# Patient Record
Sex: Male | Born: 1960
Health system: Southern US, Community
[De-identification: ages and names within clinical notes are randomized; demographics above are authoritative.]

## PROBLEM LIST (undated history)

## (undated) DIAGNOSIS — I10 Essential (primary) hypertension: Secondary | ICD-10-CM

## (undated) DIAGNOSIS — E876 Hypokalemia: Secondary | ICD-10-CM

## (undated) DIAGNOSIS — F259 Schizoaffective disorder, unspecified: Secondary | ICD-10-CM

## (undated) DIAGNOSIS — R600 Localized edema: Secondary | ICD-10-CM

## (undated) DIAGNOSIS — F200 Paranoid schizophrenia: Secondary | ICD-10-CM

## (undated) DIAGNOSIS — R011 Cardiac murmur, unspecified: Secondary | ICD-10-CM

## (undated) DIAGNOSIS — F319 Bipolar disorder, unspecified: Secondary | ICD-10-CM

## (undated) HISTORY — PX: FRACTURE SURGERY: SHX138

## (undated) HISTORY — PX: BREAST SURGERY: SHX581

## (undated) HISTORY — DX: Cardiac murmur, unspecified: R01.1

## (undated) HISTORY — PX: EYE SURGERY: SHX253

---
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
## 2011-10-11 ENCOUNTER — Encounter (HOSPITAL_COMMUNITY): Payer: Self-pay | Admitting: *Deleted

## 2011-10-11 ENCOUNTER — Emergency Department (HOSPITAL_COMMUNITY)
Admission: EM | Admit: 2011-10-11 | Discharge: 2011-10-11 | Disposition: A | Discharge status: Other Acute Inpatient Hospital | Payer: Medicare Other | Attending: Emergency Medicine | Admitting: Emergency Medicine

## 2011-10-11 DIAGNOSIS — I10 Essential (primary) hypertension: Secondary | ICD-10-CM | POA: Insufficient documentation

## 2011-10-11 DIAGNOSIS — E119 Type 2 diabetes mellitus without complications: Secondary | ICD-10-CM | POA: Insufficient documentation

## 2011-10-11 DIAGNOSIS — M549 Dorsalgia, unspecified: Secondary | ICD-10-CM | POA: Insufficient documentation

## 2011-10-11 DIAGNOSIS — F22 Delusional disorders: Secondary | ICD-10-CM | POA: Insufficient documentation

## 2011-10-11 DIAGNOSIS — F209 Schizophrenia, unspecified: Secondary | ICD-10-CM | POA: Insufficient documentation

## 2011-10-11 HISTORY — DX: Essential (primary) hypertension: I10

## 2011-10-11 LAB — BASIC METABOLIC PANEL
CO2: 27 mEq/L (ref 19–32)
Calcium: 9.9 mg/dL (ref 8.4–10.5)
Creatinine, Ser: 1.33 mg/dL (ref 0.50–1.35)
Glucose, Bld: 114 mg/dL — ABNORMAL HIGH (ref 70–99)

## 2011-10-11 LAB — CBC
HCT: 38.8 % — ABNORMAL LOW (ref 39.0–52.0)
Hemoglobin: 12.9 g/dL — ABNORMAL LOW (ref 13.0–17.0)
MCV: 86.8 fL (ref 78.0–100.0)
RBC: 4.47 MIL/uL (ref 4.22–5.81)
RDW: 15.7 % — ABNORMAL HIGH (ref 11.5–15.5)
WBC: 7.5 10*3/uL (ref 4.0–10.5)

## 2011-10-11 LAB — DIFFERENTIAL
Eosinophils Relative: 3 % (ref 0–5)
Lymphocytes Relative: 27 % (ref 12–46)
Lymphs Abs: 2.1 10*3/uL (ref 0.7–4.0)
Monocytes Absolute: 1.2 10*3/uL — ABNORMAL HIGH (ref 0.1–1.0)
Monocytes Relative: 16 % — ABNORMAL HIGH (ref 3–12)

## 2011-10-11 LAB — RAPID URINE DRUG SCREEN, HOSP PERFORMED: Benzodiazepines: NOT DETECTED

## 2011-10-11 MED ORDER — HYDRALAZINE HCL 25 MG PO TABS
100.0000 mg | ORAL_TABLET | Freq: Four times a day (QID) | ORAL | Status: DC
Start: 2011-10-11 — End: 2011-10-11
  Administered 2011-10-11: 100 mg via ORAL
  Filled 2011-10-11 (×5): qty 2

## 2011-10-11 MED ORDER — BENZTROPINE MESYLATE 1 MG PO TABS
1.0000 mg | ORAL_TABLET | Freq: Every day | ORAL | Status: DC
  Administered 2011-10-11: 1 mg via ORAL
  Filled 2011-10-11: qty 1

## 2011-10-11 MED ORDER — GABAPENTIN 300 MG PO CAPS
300.0000 mg | ORAL_CAPSULE | Freq: Three times a day (TID) | ORAL | Status: DC
  Filled 2011-10-11 (×3): qty 1

## 2011-10-11 MED ORDER — IBUPROFEN 400 MG PO TABS: 600.0000 mg | ORAL_TABLET | Freq: Three times a day (TID) | ORAL | Status: DC | PRN

## 2011-10-11 MED ORDER — AMLODIPINE BESYLATE 5 MG PO TABS: 5.0000 mg | ORAL_TABLET | Freq: Two times a day (BID) | ORAL | Status: DC

## 2011-10-11 MED ORDER — POTASSIUM CHLORIDE CRYS ER 20 MEQ PO TBCR
40.0000 meq | EXTENDED_RELEASE_TABLET | Freq: Once | ORAL | Status: AC
  Administered 2011-10-11: 40 meq via ORAL
  Filled 2011-10-11: qty 2

## 2011-10-11 MED ORDER — LORAZEPAM 1 MG PO TABS
1.0000 mg | ORAL_TABLET | ORAL | Status: DC | PRN
  Administered 2011-10-11: 1 mg via ORAL
  Filled 2011-10-11: qty 1

## 2011-10-11 MED ORDER — METFORMIN HCL 500 MG PO TABS: 500.0000 mg | ORAL_TABLET | Freq: Two times a day (BID) | ORAL | Status: DC

## 2011-10-11 MED ORDER — THIOTHIXENE 5 MG PO CAPS
15.0000 mg | ORAL_CAPSULE | ORAL | Status: DC
  Filled 2011-10-11 (×2): qty 1

## 2011-10-11 MED ORDER — THIOTHIXENE 5 MG PO CAPS
15.0000 mg | ORAL_CAPSULE | Freq: Every day | ORAL | Status: DC
  Filled 2011-10-11 (×2): qty 3

## 2011-10-11 MED ORDER — ACETAMINOPHEN 325 MG PO TABS: 650.0000 mg | ORAL_TABLET | ORAL | Status: DC | PRN

## 2011-10-11 MED ORDER — ONDANSETRON HCL 4 MG PO TABS: 4.0000 mg | ORAL_TABLET | Freq: Three times a day (TID) | ORAL | Status: DC | PRN
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
## 2011-10-11 NOTE — ED Notes (Signed)
Ivan [name] has refused administration of all other medications; Dr. Christy Gentles advised of same.  No further actions taken at present.
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
## 2013-01-07 ENCOUNTER — Encounter (HOSPITAL_COMMUNITY): Payer: Self-pay

## 2013-01-07 ENCOUNTER — Emergency Department (HOSPITAL_COMMUNITY)
Admission: EM | Admit: 2013-01-07 | Discharge: 2013-01-08 | Disposition: A | Discharge status: Other Acute Inpatient Hospital | Payer: Medicare Other | Attending: Psychiatry | Admitting: Psychiatry

## 2013-01-07 DIAGNOSIS — Z79899 Other long term (current) drug therapy: Secondary | ICD-10-CM | POA: Insufficient documentation

## 2013-01-07 DIAGNOSIS — IMO0002 Reserved for concepts with insufficient information to code with codable children: Secondary | ICD-10-CM | POA: Insufficient documentation

## 2013-01-07 DIAGNOSIS — F319 Bipolar disorder, unspecified: Secondary | ICD-10-CM | POA: Insufficient documentation

## 2013-01-07 DIAGNOSIS — F29 Unspecified psychosis not due to a substance or known physiological condition: Secondary | ICD-10-CM

## 2013-01-07 DIAGNOSIS — F17200 Nicotine dependence, unspecified, uncomplicated: Secondary | ICD-10-CM | POA: Insufficient documentation

## 2013-01-07 DIAGNOSIS — F259 Schizoaffective disorder, unspecified: Secondary | ICD-10-CM | POA: Insufficient documentation

## 2013-01-07 DIAGNOSIS — E119 Type 2 diabetes mellitus without complications: Secondary | ICD-10-CM | POA: Insufficient documentation

## 2013-01-07 DIAGNOSIS — Z7982 Long term (current) use of aspirin: Secondary | ICD-10-CM | POA: Insufficient documentation

## 2013-01-07 DIAGNOSIS — F22 Delusional disorders: Secondary | ICD-10-CM | POA: Insufficient documentation

## 2013-01-07 DIAGNOSIS — I10 Essential (primary) hypertension: Secondary | ICD-10-CM | POA: Insufficient documentation

## 2013-01-07 HISTORY — DX: Schizoaffective disorder, unspecified: F25.9

## 2013-01-07 HISTORY — DX: Bipolar disorder, unspecified: F31.9

## 2013-01-07 LAB — ETHANOL: Alcohol, Ethyl (B): <11 mg/dL (ref 0–11)

## 2013-01-07 LAB — BASIC METABOLIC PANEL
CO2: 29 mEq/L (ref 19–32)
Chloride: 103 mEq/L (ref 96–112)
Glucose, Bld: 136 mg/dL — ABNORMAL HIGH (ref 70–99)
Potassium: 2.9 mEq/L — ABNORMAL LOW (ref 3.5–5.1)
Sodium: 140 mEq/L (ref 135–145)

## 2013-01-07 LAB — CBC WITH DIFFERENTIAL/PLATELET
Basophils Relative: 1 % (ref 0–1)
Eosinophils Relative: 4 % (ref 0–5)
HCT: 34.5 % — ABNORMAL LOW (ref 39.0–52.0)
Hemoglobin: 11.2 g/dL — ABNORMAL LOW (ref 13.0–17.0)
Lymphocytes Relative: 28 % (ref 12–46)
MCHC: 32.5 g/dL (ref 30.0–36.0)
Monocytes Relative: 13 % — ABNORMAL HIGH (ref 3–12)
Neutro Abs: 3.8 10*3/uL (ref 1.7–7.7)
RBC: 3.85 MIL/uL — ABNORMAL LOW (ref 4.22–5.81)

## 2013-01-07 LAB — RAPID URINE DRUG SCREEN, HOSP PERFORMED
Amphetamines: NOT DETECTED
Cocaine: NOT DETECTED
Opiates: NOT DETECTED
Tetrahydrocannabinol: NOT DETECTED

## 2013-01-07 LAB — POCT I-STAT, CHEM 8
BUN: 14 mg/dL (ref 6–23)
Creatinine, Ser: 1.3 mg/dL (ref 0.50–1.35)
Glucose, Bld: 117 mg/dL — ABNORMAL HIGH (ref 70–99)
Hemoglobin: 11.9 g/dL — ABNORMAL LOW (ref 13.0–17.0)
TCO2: 29 mmol/L (ref 0–100)

## 2013-01-07 MED ORDER — HYDRALAZINE HCL 50 MG PO TABS: 100.0000 mg | ORAL_TABLET | Freq: Four times a day (QID) | ORAL | Status: DC

## 2013-01-07 MED ORDER — THIOTHIXENE 5 MG PO CAPS
15.0000 mg | ORAL_CAPSULE | Freq: Every day | ORAL | Status: DC
  Filled 2013-01-07 (×2): qty 1

## 2013-01-07 MED ORDER — AMLODIPINE BESYLATE 5 MG PO TABS
5.0000 mg | ORAL_TABLET | Freq: Two times a day (BID) | ORAL | Status: DC
  Administered 2013-01-07: 5 mg via ORAL
  Filled 2013-01-07: qty 1

## 2013-01-07 MED ORDER — IBUPROFEN 400 MG PO TABS
400.0000 mg | ORAL_TABLET | Freq: Once | ORAL | Status: AC
Start: 2013-01-07 — End: 2013-01-07
  Administered 2013-01-07: 400 mg via ORAL
  Filled 2013-01-07: qty 1

## 2013-01-07 MED ORDER — THIOTHIXENE 1 MG PO CAPS
ORAL_CAPSULE | ORAL | Status: AC
  Filled 2013-01-07: qty 4

## 2013-01-07 MED ORDER — POTASSIUM CHLORIDE ER 10 MEQ PO TBCR
10.0000 meq | EXTENDED_RELEASE_TABLET | Freq: Two times a day (BID) | ORAL | Status: DC
  Filled 2013-01-07 (×4): qty 1

## 2013-01-07 MED ORDER — IBUPROFEN 400 MG PO TABS
200.0000 mg | ORAL_TABLET | Freq: Four times a day (QID) | ORAL | Status: DC | PRN
  Administered 2013-01-07: 200 mg via ORAL
  Filled 2013-01-07: qty 1

## 2013-01-07 MED ORDER — POTASSIUM CHLORIDE 20 MEQ/15ML (10%) PO LIQD
40.0000 meq | Freq: Once | ORAL | Status: AC
  Administered 2013-01-07: 40 meq via ORAL
  Filled 2013-01-07: qty 30

## 2013-01-07 MED ORDER — GABAPENTIN 300 MG PO CAPS
ORAL_CAPSULE | ORAL | Status: AC
  Filled 2013-01-07: qty 1

## 2013-01-07 MED ORDER — METFORMIN HCL 500 MG PO TABS: 500.0000 mg | ORAL_TABLET | Freq: Two times a day (BID) | ORAL | Status: DC

## 2013-01-07 MED ORDER — GABAPENTIN 300 MG PO CAPS
300.0000 mg | ORAL_CAPSULE | Freq: Three times a day (TID) | ORAL | Status: DC
  Administered 2013-01-07: 300 mg via ORAL
  Filled 2013-01-07 (×5): qty 1

## 2013-01-07 MED ORDER — BENZTROPINE MESYLATE 1 MG PO TABS
1.0000 mg | ORAL_TABLET | Freq: Every day | ORAL | Status: DC
  Administered 2013-01-07: 1 mg via ORAL
  Filled 2013-01-07: qty 1

## 2013-01-07 MED ORDER — HYDRALAZINE HCL 25 MG PO TABS
ORAL_TABLET | ORAL | Status: AC
  Filled 2013-01-07: qty 4

## 2013-01-07 MED ORDER — POTASSIUM CHLORIDE CRYS ER 20 MEQ PO TBCR
EXTENDED_RELEASE_TABLET | ORAL | Status: AC
  Administered 2013-01-07: 10 meq
  Filled 2013-01-07: qty 1

## 2013-01-07 MED ORDER — HYDRALAZINE HCL 50 MG PO TABS
100.0000 mg | ORAL_TABLET | Freq: Four times a day (QID) | ORAL | Status: DC
  Administered 2013-01-07: 100 mg via ORAL
  Filled 2013-01-07 (×7): qty 2

## 2013-01-07 MED ORDER — THIOTHIXENE 2 MG PO CAPS
ORAL_CAPSULE | ORAL | Status: AC
  Filled 2013-01-07: qty 3

## 2013-01-07 MED ORDER — ASPIRIN EC 81 MG PO TBEC
81.0000 mg | DELAYED_RELEASE_TABLET | Freq: Every day | ORAL | Status: DC
  Filled 2013-01-07 (×2): qty 1

## 2013-01-07 MED ORDER — FUROSEMIDE 40 MG PO TABS
40.0000 mg | ORAL_TABLET | Freq: Two times a day (BID) | ORAL | Status: DC
  Administered 2013-01-07: 40 mg via ORAL
  Filled 2013-01-07 (×2): qty 1

## 2013-01-07 NOTE — Progress Notes (Signed)
SUPPORT PAPERWORK FAXED TO [facility].

## 2013-01-07 NOTE — Progress Notes (Signed)
PT ACCEPTED BY [facility] TO DR Darleene Cleaver ROOM 407-1.

## 2013-01-07 NOTE — ED Notes (Signed)
[facility] notifed that bed is available and ready for this patients transfer.  Patient also made aware of plan for transfer.  Patient spoke with Bonnita Levan and is agreeable. Report called to Opal Sidles, Therapist, sports at [facility].

## 2013-01-07 NOTE — ED Notes (Signed)
Ambulatory to bathroom.  Returned to room and remains under observation of [facility] Deputy.
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
## 2013-01-07 NOTE — ED Notes (Signed)
Attempted to give patient Navane 15mg  as ordered earlier this evening.  He does not want to take now - states he took it earlier today before he went to court. Agreeable to take all other meds as ordered
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
## 2013-01-07 NOTE — ED Notes (Signed)
Ivan [name] (ACT) will be here after 6pm to see the Pt.

## 2013-01-07 NOTE — ED Notes (Signed)
Pt states he was in court today regarding family issues.

## 2013-01-08 ENCOUNTER — Inpatient Hospital Stay (HOSPITAL_COMMUNITY)
Admission: EM | Admit: 2013-01-08 | Discharge: 2013-01-14 | DRG: 885 | Disposition: A | Discharge status: Home / Self Care | Payer: 59 | Source: Intra-hospital | Attending: Psychiatry | Admitting: Psychiatry

## 2013-01-08 ENCOUNTER — Encounter (HOSPITAL_COMMUNITY): Payer: Self-pay | Admitting: *Deleted

## 2013-01-08 DIAGNOSIS — Z79899 Other long term (current) drug therapy: Secondary | ICD-10-CM

## 2013-01-08 DIAGNOSIS — F200 Paranoid schizophrenia: Principal | ICD-10-CM | POA: Diagnosis present

## 2013-01-08 DIAGNOSIS — I10 Essential (primary) hypertension: Secondary | ICD-10-CM | POA: Diagnosis present

## 2013-01-08 DIAGNOSIS — E119 Type 2 diabetes mellitus without complications: Secondary | ICD-10-CM | POA: Diagnosis present

## 2013-01-08 LAB — GLUCOSE, CAPILLARY
Glucose-Capillary: 122 mg/dL — ABNORMAL HIGH (ref 70–99)
Glucose-Capillary: 154 mg/dL — ABNORMAL HIGH (ref 70–99)

## 2013-01-08 LAB — POCT I-STAT TROPONIN I

## 2013-01-08 LAB — HEMOGLOBIN A1C
Hgb A1c MFr Bld: 6.6 % — ABNORMAL HIGH (ref <5.7)
Mean Plasma Glucose: 143 mg/dL — ABNORMAL HIGH (ref <117)

## 2013-01-08 MED ORDER — ALUM & MAG HYDROXIDE-SIMETH 200-200-20 MG/5ML PO SUSP: 30.0000 mL | ORAL | Status: DC | PRN

## 2013-01-08 MED ORDER — MAGNESIUM HYDROXIDE 400 MG/5ML PO SUSP: 30.0000 mL | Freq: Every day | ORAL | Status: DC | PRN

## 2013-01-08 MED ORDER — BENZTROPINE MESYLATE 1 MG PO TABS
1.0000 mg | ORAL_TABLET | Freq: Every day | ORAL | Status: DC
  Administered 2013-01-08 – 2013-01-14 (×7): 1 mg via ORAL
  Filled 2013-01-08 (×9): qty 1

## 2013-01-08 MED ORDER — ASPIRIN EC 81 MG PO TBEC
81.0000 mg | DELAYED_RELEASE_TABLET | Freq: Every day | ORAL | Status: DC
  Administered 2013-01-08 – 2013-01-14 (×7): 81 mg via ORAL
  Filled 2013-01-08 (×9): qty 1

## 2013-01-08 MED ORDER — POTASSIUM CHLORIDE ER 10 MEQ PO TBCR
10.0000 meq | EXTENDED_RELEASE_TABLET | Freq: Two times a day (BID) | ORAL | Status: DC
  Administered 2013-01-08 – 2013-01-14 (×13): 10 meq via ORAL
  Filled 2013-01-08 (×17): qty 1

## 2013-01-08 MED ORDER — INSULIN ASPART 100 UNIT/ML ~~LOC~~ SOLN: 0.0000 [IU] | Freq: Every day | SUBCUTANEOUS | Status: DC

## 2013-01-08 MED ORDER — AMLODIPINE BESYLATE 5 MG PO TABS
5.0000 mg | ORAL_TABLET | Freq: Two times a day (BID) | ORAL | Status: DC
  Administered 2013-01-08 – 2013-01-14 (×13): 5 mg via ORAL
  Filled 2013-01-08 (×18): qty 1

## 2013-01-08 MED ORDER — NICOTINE POLACRILEX 2 MG MT GUM
2.0000 mg | CHEWING_GUM | OROMUCOSAL | Status: DC | PRN
  Administered 2013-01-08 – 2013-01-14 (×24): 2 mg via ORAL
  Filled 2013-01-08 (×3): qty 1

## 2013-01-08 MED ORDER — CLONIDINE HCL 0.1 MG PO TABS
0.1000 mg | ORAL_TABLET | Freq: Three times a day (TID) | ORAL | Status: DC | PRN
  Administered 2013-01-09: 0.1 mg via ORAL
  Filled 2013-01-08: qty 1

## 2013-01-08 MED ORDER — GABAPENTIN 300 MG PO CAPS
300.0000 mg | ORAL_CAPSULE | Freq: Three times a day (TID) | ORAL | Status: DC
  Administered 2013-01-08 – 2013-01-09 (×4): 300 mg via ORAL
  Filled 2013-01-08 (×7): qty 1

## 2013-01-08 MED ORDER — INSULIN ASPART 100 UNIT/ML ~~LOC~~ SOLN
0.0000 [IU] | Freq: Three times a day (TID) | SUBCUTANEOUS | Status: DC
  Administered 2013-01-08 – 2013-01-11 (×5): 3 [IU] via SUBCUTANEOUS
  Administered 2013-01-11: 0 [IU] via SUBCUTANEOUS
  Administered 2013-01-11 – 2013-01-12 (×4): 3 [IU] via SUBCUTANEOUS
  Administered 2013-01-13: 4 [IU] via SUBCUTANEOUS
  Administered 2013-01-13 – 2013-01-14 (×2): 3 [IU] via SUBCUTANEOUS

## 2013-01-08 MED ORDER — TRAZODONE HCL 50 MG PO TABS
50.0000 mg | ORAL_TABLET | Freq: Every evening | ORAL | Status: DC | PRN
  Administered 2013-01-08 – 2013-01-13 (×6): 50 mg via ORAL
  Filled 2013-01-08 (×16): qty 1

## 2013-01-08 MED ORDER — THIOTHIXENE 5 MG PO CAPS
15.0000 mg | ORAL_CAPSULE | Freq: Every day | ORAL | Status: DC
  Administered 2013-01-08 – 2013-01-14 (×7): 15 mg via ORAL
  Filled 2013-01-08 (×9): qty 1

## 2013-01-08 MED ORDER — FUROSEMIDE 40 MG PO TABS
40.0000 mg | ORAL_TABLET | Freq: Two times a day (BID) | ORAL | Status: DC
  Administered 2013-01-08 – 2013-01-14 (×13): 40 mg via ORAL
  Filled 2013-01-08 (×17): qty 1

## 2013-01-08 MED ORDER — ACETAMINOPHEN 325 MG PO TABS: 650.0000 mg | ORAL_TABLET | Freq: Four times a day (QID) | ORAL | Status: DC | PRN

## 2013-01-08 MED ORDER — NICOTINE 14 MG/24HR TD PT24
14.0000 mg | MEDICATED_PATCH | Freq: Every day | TRANSDERMAL | Status: DC
  Filled 2013-01-08: qty 1

## 2013-01-08 MED ORDER — METFORMIN HCL 500 MG PO TABS
500.0000 mg | ORAL_TABLET | Freq: Two times a day (BID) | ORAL | Status: DC
  Administered 2013-01-08 – 2013-01-14 (×13): 500 mg via ORAL
  Filled 2013-01-08 (×17): qty 1

## 2013-01-08 MED ORDER — IBUPROFEN 600 MG PO TABS
600.0000 mg | ORAL_TABLET | Freq: Four times a day (QID) | ORAL | Status: DC | PRN
  Administered 2013-01-08 – 2013-01-13 (×3): 600 mg via ORAL
  Filled 2013-01-08 (×3): qty 1

## 2013-01-08 MED ORDER — HYDRALAZINE HCL 50 MG PO TABS
100.0000 mg | ORAL_TABLET | Freq: Four times a day (QID) | ORAL | Status: DC
  Administered 2013-01-08 – 2013-01-14 (×26): 100 mg via ORAL
  Filled 2013-01-08 (×35): qty 2
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
## 2013-01-08 NOTE — BHH Group Notes (Signed)
[facility] LCSW Aftercare Discharge Planning Group Note   01/08/2013 7:43 AM  Participation Quality:  Did not attend.  Said his shoulder was hurting too much to attend    [name], Barbaraann Rondo B

## 2013-01-08 NOTE — Progress Notes (Signed)
Adult Psychoeducational Group Note  Date:  01/08/2013 Time:  11:33 PM  Group Topic/Focus:  Karaoke  Participation Level:  Active  Participation Quality:  Appropriate  Affect:  Appropriate  Cognitive:  Alert  Insight: Appropriate  Engagement in Group:  Engaged  Modes of Intervention:  Activity  Additional Comments:    Sharmon Revere 01/08/2013, 11:33 PM
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
## 2013-01-08 NOTE — Progress Notes (Signed)
Adult Psychoeducational Group Note  Date:  01/08/2013 Time:  0930 Group Topic/Focus:  Rediscovering Joy:   The focus of this group is to explore various ways to relieve stress in a positive manner.  Participation Level:  Minimal  Participation Quality:  Appropriate  Affect:  Appropriate  Cognitive:  Oriented  Insight: Limited  Engagement in Group:  Limited  Modes of Intervention:  Discussion and Education  Additional Comments:  Pt was only concern about the pain he was feeling in his right arm, pt was told to let the nurse know about his condition.  Owen Pratte E 01/08/2013, 11:13 AM
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
## 2013-01-08 NOTE — Tx Team (Signed)
Initial Interdisciplinary Treatment Plan  PATIENT STRENGTHS: (choose at least two) Average or above average intelligence Capable of independent living General fund of knowledge  PATIENT STRESSORS: Legal issue Marital or family conflict Traumatic event   PROBLEM LIST: Problem List/Patient Goals Date to be addressed Date deferred Reason deferred Estimated date of resolution  Schizophrenia with paranoid/delusional thinking      Family conflict- thinks they are spying on him      Angry outburst-easily agitated      HTN      NIDDM                               DISCHARGE CRITERIA:  Ability to meet basic life and health needs Improved stabilization in mood, thinking, and/or behavior Motivation to continue treatment in a less acute level of care Reduction of life-threatening or endangering symptoms to within safe limits Safe-care adequate arrangements made Verbal commitment to aftercare and medication compliance  PRELIMINARY DISCHARGE PLAN: Attend aftercare/continuing care group Outpatient therapy Return to previous living arrangement  PATIENT/FAMIILY INVOLVEMENT: This treatment plan has been presented to and reviewed with the patient, Ivan [name], and/or family member.  The patient and family have been given the opportunity to ask questions and make suggestions.  Junius Finner [facility] 01/08/2013, 4:23 AM

## 2013-01-08 NOTE — Progress Notes (Signed)
Patient ID: Ivan [name], male   DOB: 12/11/1960, 52 y.o.   MRN: [id number] D: Patient in room on approach. Pt presented with depressed mood and flat affect. Pt denies SI/HI/AVH and pain. Pt attended evening karaoke but return early to unit. Pt is mostly in his room with minimal interaction with staff and peers.  Pt denies any needs or concerns.  Cooperative with assessment. No acute distressed noted at this time.   A: Met with pt 1:1. Medications administered as prescribed. Writer encouraged pt to discuss feelings. Pt encouraged to come to staff with any question or concerns. 15 minutes checks for safety.  R: Patient remains safe. He is complaint with medications and denies any adverse reaction. Continue current POC.

## 2013-01-08 NOTE — Clinical Social Work Note (Signed)
[facility] Group Notes:  (Counselor/Nursing/MHT/Case Management/Adjunct)  07/03/2012 2:20 PM  Type of Therapy:  Group Therapy  Participation Level:  Minimal  Participation Quality:  Drowsy  Affect:  Blunted  Cognitive:  Oriented  Insight:  Limited  Engagement in Group:  Limited  Engagement in Therapy:  Limited  Modes of Intervention:  Discussion, Exploration and Socialization  Summary of Progress/Problems: .balance: The topic for group was balance in life.  Pt participated in the discussion about when their life was in balance and out of balance and how this feels.  Pt discussed ways to get back in balance and short term goals they can work on to get where they want to be. Imri slept through much of group, and left early.  He stated that he is moving from unbalance, and the way he does this by "blocking out all the mess in my head-just letting it go."  Unable to be more specific about how this is possible.  Left early.   [name] 07/03/2012, 2:20 PM

## 2013-01-08 NOTE — Progress Notes (Signed)
D:  Patient up and in the milieu much of the day.  Patient believes that family members have cameras inside his home to monitor everything he does.  He complained of right shoulder pain this morning and requested ibuprofen.  He has been attending most groups, but is not able to fully participate due to his mental state at this time.  He did not fill out his self inventory today, but denies suicidal ideation.  His appetite and sleep are both good.   A:  Medications given as prescribed.  Encouraged attendance in all groups.  Encouraged patient to stay up throughout the day so that he would have an easier time falling asleep tonight.  He was medicated once with ibuprofen 600 mg PO with fair relief and has received nicotine gum twice this shift.    R:  Pleasant and cooperative.  Interacting well with staff and peers.  Patient remains delusional.
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
## 2013-01-09 LAB — GLUCOSE, CAPILLARY
Glucose-Capillary: 129 mg/dL — ABNORMAL HIGH (ref 70–99)
Glucose-Capillary: 117 mg/dL — ABNORMAL HIGH (ref 70–99)

## 2013-01-09 MED ORDER — GABAPENTIN 400 MG PO CAPS
400.0000 mg | ORAL_CAPSULE | Freq: Three times a day (TID) | ORAL | Status: DC
  Administered 2013-01-09 – 2013-01-14 (×16): 400 mg via ORAL
  Filled 2013-01-09 (×22): qty 1

## 2013-01-09 NOTE — B[facility] Group Notes (Signed)
[facility] LCSW Aftercare Discharge Planning Group Note   01/09/2013 10:41 AM  Participation Quality:  Engaged  Mood/Affect:  Appropriate  Depression Rating:  denies  Anxiety Rating:  denies  Thoughts of Suicide:  No Will you contract for safety?   NA  Current AVH:  No  Plan for Discharge/Comments:  Torres wanted to know if he was being released today.  I told him no.  He accepted this without problem, then left group.  [employer]: sheriff  Supports: none  [place], [place] B

## 2013-01-09 NOTE — Progress Notes (Signed)
[facility] MD Progress Note  01/09/2013 10:49 AM Ivan [name]  MRN:  [id number] Subjective: " Please tell my family to stay out of my life". Objective: Patient reports difficulty sleeping, racing thoughts, excessive worries and irritable mood. He remains convinced that his family are spying on him and wants them to stay out of his life. He is compliant with his medications and has not endorsed any adverse reactions to them.  Diagnosis:  Axis I: Chronic Paranoid Schizophrenia  ADL's:  Intact  Sleep: Poor  Appetite:  Fair  Suicidal Ideation:  Plan:  denies Intent:  denies Means:  denies Homicidal Ideation:  Plan:  denies Intent:  denies Means:  denies AEB (as evidenced by):  Psychiatric Specialty Exam: Review of Systems  Constitutional: Negative.   HENT: Negative.   Respiratory: Negative.   Gastrointestinal: Negative.   Genitourinary: Negative.   Musculoskeletal: Positive for myalgias and joint pain.  Skin: Negative.   Neurological: Negative.   Endo/Heme/Allergies: Negative.   Psychiatric/Behavioral: The patient is nervous/anxious and has insomnia.     Blood pressure 154/97, pulse 98, temperature 98 F (36.7 C), temperature source Oral, resp. rate 18, height 6\' 3"  (1.905 m), weight 136.533 kg (301 lb).Body mass index is 37.62 kg/(m^2).  General Appearance: Fairly Groomed  Engineer, water::  Minimal  Speech:  Clear and Coherent  Volume:  Normal  Mood:  Irritable  Affect:  Blunt  Thought Process:  Disorganized  Orientation:  Full (Time, Place, and Person)  Thought Content:  Delusions and Paranoid Ideation  Suicidal Thoughts:  No  Homicidal Thoughts:  No  Memory:  Immediate;   Fair Recent;   Fair Remote;   Fair  Judgement:  Poor  Insight:  Lacking  Psychomotor Activity:  Normal  Concentration:  Fair  Recall:  Fair  Akathisia:  No  Handed:  Right  AIMS (if indicated):     Assets:  Desire for Improvement Housing Social Support Transportation  Sleep:  Number of Hours:  3.25   Current Medications: Current Facility-Administered Medications  Medication Dose Route Frequency Provider Last Rate Last Dose  . acetaminophen (TYLENOL) tablet 650 mg  650 mg Oral Q6H PRN Laverle Hobby, PA-C      . alum & mag hydroxide-simeth (MAALOX/MYLANTA) 200-200-20 MG/5ML suspension 30 mL  30 mL Oral Q4H PRN Laverle Hobby, PA-C      . amLODipine (NORVASC) tablet 5 mg  5 mg Oral BID Laverle Hobby, PA-C   5 mg at 01/09/13 [id number]  . aspirin EC tablet 81 mg  81 mg Oral Daily Laverle Hobby, PA-C   81 mg at 01/09/13 [id number]  . benztropine (COGENTIN) tablet 1 mg  1 mg Oral Daily Laverle Hobby, PA-C   1 mg at 01/09/13 [id number]  . cloNIDine (CATAPRES) tablet 0.1 mg  0.1 mg Oral Q8H PRN Carrel Leather      . furosemide (LASIX) tablet 40 mg  40 mg Oral BID Laverle Hobby, PA-C   40 mg at 01/09/13 [id number]  . gabapentin (NEURONTIN) capsule 400 mg  400 mg Oral TID Hollynn Garno      . hydrALAZINE (APRESOLINE) tablet 100 mg  100 mg Oral QID Laverle Hobby, PA-C   100 mg at 01/09/13 [id number]  . ibuprofen (ADVIL,MOTRIN) tablet 600 mg  600 mg Oral Q6H PRN Laverle Hobby, PA-C   600 mg at 01/08/13 0739  . insulin aspart (novoLOG) injection 0-20 Units  0-20 Units Subcutaneous TID WC Laverle Hobby, PA-C  3 Units at 01/09/13 0646  . insulin aspart (novoLOG) injection 0-5 Units  0-5 Units Subcutaneous QHS Spencer E Simon, PA-C      . magnesium hydroxide (MILK OF MAGNESIA) suspension 30 mL  30 mL Oral Daily PRN Laverle Hobby, PA-C      . metFORMIN (GLUCOPHAGE) tablet 500 mg  500 mg Oral BID WC Laverle Hobby, PA-C   500 mg at 01/09/13 [id number]  . nicotine polacrilex (NICORETTE) gum 2 mg  2 mg Oral PRN Laverle Hobby, PA-C   2 mg at 01/09/13 0834  . potassium chloride (K-DUR) CR tablet 10 mEq  10 mEq Oral BID Laverle Hobby, PA-C   10 mEq at 01/09/13 [id number]  . thiothixene (NAVANE) capsule 15 mg  15 mg Oral Daily Laverle Hobby, PA-C   15 mg at 01/09/13 0737  . traZODone (DESYREL) tablet 50 mg  50 mg Oral  QHS,MR X 1 Laverle Hobby, PA-C   50 mg at 01/08/13 2150    Lab Results:  Results for orders placed during the hospital encounter of 01/08/13 (from the past 48 hour(s))  GLUCOSE, CAPILLARY     Status: Abnormal   Collection Time    01/08/13  6:10 AM      Result Value Range   Glucose-Capillary 122 (*) 70 - 99 mg/dL  HEMOGLOBIN A1C     Status: Abnormal   Collection Time    01/08/13  6:28 AM      Result Value Range   Hemoglobin A1C 6.6 (*) <5.7 %   Comment: (NOTE)                                                                               According to the ADA Clinical Practice Recommendations for 2011, when     HbA1c is used as a screening test:      >=6.5%   Diagnostic of Diabetes Mellitus               (if abnormal result is confirmed)     5.7-6.4%   Increased risk of developing Diabetes Mellitus     References:Diagnosis and Classification of Diabetes Mellitus,Diabetes     [id number] 1):S62-S69 and Standards of Medical Care in             Diabetes - 2011,Diabetes Care,2011,34 (Suppl 1):S11-S61.   Mean Plasma Glucose 143 (*) <117 mg/dL  GLUCOSE, CAPILLARY     Status: Abnormal   Collection Time    01/08/13 11:36 AM      Result Value Range   Glucose-Capillary 165 (*) 70 - 99 mg/dL  GLUCOSE, CAPILLARY     Status: Abnormal   Collection Time    01/08/13  4:59 PM      Result Value Range   Glucose-Capillary 150 (*) 70 - 99 mg/dL   Comment 1 Documented in Chart     Comment 2 Notify RN    GLUCOSE, CAPILLARY     Status: Abnormal   Collection Time    01/08/13  9:39 PM      Result Value Range   Glucose-Capillary 154 (*) 70 - 99 mg/dL    Physical Findings: AIMS: Facial and Oral Movements Muscles  of Facial Expression: None, normal Lips and Perioral Area: None, normal Jaw: None, normal Tongue: None, normal,Extremity Movements Upper (arms, wrists, hands, fingers): None, normal Lower (legs, knees, ankles, toes): None, normal, Trunk Movements Neck, shoulders, hips: None,  normal, Overall Severity Severity of abnormal movements (highest score from questions above): None, normal Incapacitation due to abnormal movements: None, normal Patient's awareness of abnormal movements (rate only patient's report): No Awareness, Dental Status Current problems with teeth and/or dentures?: Yes Does patient usually wear dentures?: No  CIWA:    COWS:     Treatment Plan Summary: Daily contact with patient to assess and evaluate symptoms and progress in treatment Medication management  Plan:1. Admit for crisis management and stabilization. 2. Medication management to reduce current symptoms to base line and improve the     patient's overall level of functioning 3. Treat health problems as indicated. 4. Develop treatment plan to decrease risk of relapse upon discharge and the need for     readmission. 5. Psycho-social education regarding relapse prevention and self care. 6. Health care follow up as needed for medical problems. 7. Will increase Neurontin to 400mg  po TID for mood lability  8. Continue current treatment modality.   Medical Decision Making Problem Points:  Established problem, stable/improving (1), Review of last therapy session (1) and Review of psycho-social stressors (1) Data Points:  Order Aims Assessment (2) Review of medication regiment & side effects (2) Review of new medications or change in dosage (2)  I certify that inpatient services furnished can reasonably be expected to improve the patient's condition.   Deasia Chiu,MD 01/09/2013, 10:49 AM

## 2013-01-09 NOTE — Progress Notes (Signed)
Adult Psychoeducational Group Note  Date:  01/09/2013 Time:  9:21 PM  Group Topic/Focus:  Wrap-Up Group:   The focus of this group is to help patients review their daily goal of treatment and discuss progress on daily workbooks.  Participation Level:  Active  Participation Quality:  Appropriate  Affect:  Appropriate  Cognitive:  Alert  Insight: Appropriate  Engagement in Group:  Engaged  Modes of Intervention:  Discussion  Additional Comments:  Pt stated that his day started off good, but then he talked to his "devil sister" and that pissed him off. Pt states that he wants to spend time with his 49 year old son but his sister is making that hard for him to do. He thinks that it is very important for him to be in his son's life.  Wynelle Fanny R 01/09/2013, 9:21 PM

## 2013-01-09 NOTE — Progress Notes (Signed)
Adult Psychoeducational Group Note  Date:  01/09/2013 Time:  12:54 PM  Group Topic/Focus:  Relapse Prevention Planning:   The focus of this group is to define relapse and discuss the need for planning to combat relapse.  Participation Level:  Active  Participation Quality:  Appropriate, Sharing and Supportive  Affect:  Appropriate  Cognitive:  Appropriate  Insight: Appropriate  Engagement in Group:  Engaged and Supportive  Modes of Intervention:  Discussion, Education and Support  Additional Comments:  Pt attend, group, was appropriate.  Pricilla Larsson M 01/09/2013, 12:54 PM

## 2013-01-09 NOTE — Progress Notes (Signed)
Patient ID: Ivan [name], male   DOB: 07/01/1961, 52 y.o.   MRN: [id number] D: Patient in room on approach. Pt presented with depressed mood and flat affect. Pt stated he is stressed about being here and issues at home. Pt denies SI/HI/AVH and pain. Pt attended evening wrap up and engaged in discussion. Pt denies any needs or concerns.  Cooperative with assessment. No acute distressed noted at this time.   A: Met with pt 1:1. Medications administered as prescribed. Writer encouraged pt to discuss feelings. Pt encouraged to come to staff with any question or concerns. 15 minutes checks for safety.  R: Patient remains safe. He is complaint with medications and denies any adverse reaction. Continue current POC.
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
## 2013-01-09 NOTE — Progress Notes (Signed)
  D) Patient pleasant and cooperative upon my assessment. Patient completed Patient Self Inventory, reports slept "well," and  appetite is "good." Patient rates depression as  9 /10, patient rates hopeless feelings as 9 /10. Patient denies SI/HI, denies A/V hallucinations. Patient given PRN nicotine gum for nicotine cravings.   A) Patient offered support and encouragement, patient encouraged to discuss feelings/concerns with staff. Patient verbalized understanding. Patient monitored Q15 minutes for safety. Patient met with MD  to discuss today's goals and plan of care.  R) Patient visible in milieu, attending groups in day room and meals in dining room. Patient observed interscting with peers in day room between group meetings, patient appears calm and relaxed. Patient appropriate with staff and peers.   Patient taking medications as ordered. Will continue to monitor.

## 2013-01-10 LAB — GLUCOSE, CAPILLARY
Glucose-Capillary: 117 mg/dL — ABNORMAL HIGH (ref 70–99)
Glucose-Capillary: 132 mg/dL — ABNORMAL HIGH (ref 70–99)

## 2013-01-10 NOTE — [facility] Group Notes (Signed)
[facility] Group Notes:  (Clinical Social Work)  01/10/2013  11:15-11:45AM  Summary of Progress/Problems:   The main focus of today's process group was for the patient to identify ways in which they have in the past sabotaged their own recovery and reasons they may have done this/what they received from doing it.  We then worked to identify a specific plan to avoid doing this when discharged from the hospital for this admission.  The patient expressed that he self-sabotages by constantly worrying, and said that people are constantly coming after him with more to worry about.  He stated at discharge he will stay on his meds and not let people get to him.  CSW went over a couple of statements which could let people know inoffensively to leave him alone for awhile.  Type of Therapy:  Group Therapy - Process  Participation Level:  Active  Participation Quality:  Attentive and Sharing  Affect:  Blunted  Cognitive:  Appropriate and Oriented  Insight:  Developing/Improving  Engagement in Therapy:  Engaged  Modes of Intervention:  Clarification, Education, Exploration, Discussion  Selmer Dominion, LCSW 01/10/2013, 12:33 PM

## 2013-01-10 NOTE — Progress Notes (Signed)
Patient ID: Ivan [name], male   DOB: 10-08-1960, 52 y.o.   MRN: [id number] Psychoeducational Group Note  Date:  01/10/2013 Time:1000am  Group Topic/Focus:  Identifying Needs:   The focus of this group is to help patients identify their personal needs that have been historically problematic and identify healthy behaviors to address their needs.  Participation Level:  Active  Participation Quality:  Appropriate  Affect:  Lethargic  Cognitive:  Appropriate  Insight:  Supportive  Engagement in Group:  Supportive  Additional Comments:  Inventory and Psychoeducational group   Pricilla Larsson 01/10/2013,10:49 AM

## 2013-01-10 NOTE — Progress Notes (Signed)
Adult Psychoeducational Group Note  Date:  01/10/2013 Time:  1000  Group Topic/Focus:  Identifying Needs:   The focus of this group is to help patients identify their personal needs that have been historically problematic and identify healthy behaviors to address their needs.  Participation Level:  Active  Participation Quality:  Appropriate  Affect:  Appropriate  Cognitive:  Appropriate  Insight: Appropriate  Engagement in Group:  Engaged  Modes of Intervention:  Discussion  Additional Comments:     Minerva Fester 01/10/2013, 10:44 AM

## 2013-01-10 NOTE — Progress Notes (Signed)
D patient is anxious today but pleasant and cooperative, taking meds as ordered by MD, denies Si or Hi and no AVH, thinking is disorganized, judgement is poor and insight is poor, CBG 11;7 at lunch requiring no insulin, attending group and participating, going to DR for meals, eating and appetite is good. A q28min safety checks continue and support offered, encouraged to continue attending group R patient remains safe on the unit

## 2013-01-10 NOTE — Progress Notes (Signed)
[name] MD Progress Note  01/10/2013 10:20 AM Ivan [name]  MRN:  [id number] Subjective: I sleep well last night. Objective: Patient endorsed much improvement in his sleep however he continues to endorse anxiety and expressed irritable mood.  He continues to believe that his family is spying on him and wants them to stay out of his life.  He gets very anxious about his family.  He denies any tremors or shakes.  He is compliant with his medications and has not endorsed any adverse reactions to them.  Diagnosis:  Axis I: Chronic Paranoid Schizophrenia  ADL's:  Intact  Sleep: Poor  Appetite:  Fair  Suicidal Ideation:  Plan:  denies Intent:  denies Means:  denies Homicidal Ideation:  Plan:  denies Intent:  denies Means:  denies AEB (as evidenced by):  Psychiatric Specialty Exam: Review of Systems  Constitutional: Negative.   HENT: Negative.   Respiratory: Negative.   Gastrointestinal: Negative.   Genitourinary: Negative.   Musculoskeletal: Positive for myalgias and joint pain.  Skin: Negative.   Neurological: Negative.   Endo/Heme/Allergies: Negative.   Psychiatric/Behavioral: The patient is nervous/anxious and has insomnia.     Blood pressure 151/94, pulse 88, temperature 97.3 F (36.3 C), temperature source Oral, resp. rate 20, height 6\' 3"  (1.905 m), weight 136.533 kg (301 lb).Body mass index is 37.62 kg/(m^2).  General Appearance: Fairly Groomed  Engineer, water::  Minimal  Speech:  Clear and Coherent  Volume:  Normal  Mood:  Irritable  Affect:  Blunt  Thought Process:  Disorganized  Orientation:  Full (Time, Place, and Person)  Thought Content:  Delusions and Paranoid Ideation  Suicidal Thoughts:  No  Homicidal Thoughts:  No  Memory:  Immediate;   Fair Recent;   Fair Remote;   Fair  Judgement:  Poor  Insight:  Lacking  Psychomotor Activity:  Normal  Concentration:  Fair  Recall:  Fair  Akathisia:  No  Handed:  Right  AIMS (if indicated):     Assets:  Desire for  Improvement Housing Social Support Transportation  Sleep:  Number of Hours: 3.5   Current Medications: Current Facility-Administered Medications  Medication Dose Route Frequency Provider Last Rate Last Dose  . acetaminophen (TYLENOL) tablet 650 mg  650 mg Oral Q6H PRN Laverle Hobby, PA-C      . alum & mag hydroxide-simeth (MAALOX/MYLANTA) 200-200-20 MG/5ML suspension 30 mL  30 mL Oral Q4H PRN Laverle Hobby, PA-C      . amLODipine (NORVASC) tablet 5 mg  5 mg Oral BID Laverle Hobby, PA-C   5 mg at 01/10/13 0747  . aspirin EC tablet 81 mg  81 mg Oral Daily Laverle Hobby, PA-C   81 mg at 01/10/13 0747  . benztropine (COGENTIN) tablet 1 mg  1 mg Oral Daily Laverle Hobby, PA-C   1 mg at 01/10/13 0748  . cloNIDine (CATAPRES) tablet 0.1 mg  0.1 mg Oral Q8H PRN Mojeed Akintayo   0.1 mg at 01/09/13 2054  . furosemide (LASIX) tablet 40 mg  40 mg Oral BID Laverle Hobby, PA-C   40 mg at 01/10/13 0747  . gabapentin (NEURONTIN) capsule 400 mg  400 mg Oral TID Mojeed Akintayo   400 mg at 01/10/13 0747  . hydrALAZINE (APRESOLINE) tablet 100 mg  100 mg Oral QID Laverle Hobby, PA-C   100 mg at 01/10/13 0747  . ibuprofen (ADVIL,MOTRIN) tablet 600 mg  600 mg Oral Q6H PRN Laverle Hobby, PA-C   600  mg at 01/08/13 0739  . insulin aspart (novoLOG) injection 0-20 Units  0-20 Units Subcutaneous TID WC Laverle Hobby, PA-C   3 Units at 01/10/13 [phone]  . insulin aspart (novoLOG) injection 0-5 Units  0-5 Units Subcutaneous QHS Spencer E Simon, PA-C      . magnesium hydroxide (MILK OF MAGNESIA) suspension 30 mL  30 mL Oral Daily PRN Laverle Hobby, PA-C      . metFORMIN (GLUCOPHAGE) tablet 500 mg  500 mg Oral BID WC Laverle Hobby, PA-C   500 mg at 01/10/13 0747  . nicotine polacrilex (NICORETTE) gum 2 mg  2 mg Oral PRN Laverle Hobby, PA-C   2 mg at 01/10/13 0750  . potassium chloride (K-DUR) CR tablet 10 mEq  10 mEq Oral BID Laverle Hobby, PA-C   10 mEq at 01/10/13 0748  . thiothixene (NAVANE)  capsule 15 mg  15 mg Oral Daily Laverle Hobby, PA-C   15 mg at 01/10/13 0747  . traZODone (DESYREL) tablet 50 mg  50 mg Oral QHS,MR X 1 Laverle Hobby, PA-C   50 mg at 01/09/13 2204    Lab Results:  Results for orders placed during the hospital encounter of 01/08/13 (from the past 48 hour(s))  GLUCOSE, CAPILLARY     Status: Abnormal   Collection Time    01/08/13 11:36 AM      Result Value Range   Glucose-Capillary 165 (*) 70 - 99 mg/dL  GLUCOSE, CAPILLARY     Status: Abnormal   Collection Time    01/08/13  4:59 PM      Result Value Range   Glucose-Capillary 150 (*) 70 - 99 mg/dL   Comment 1 Documented in Chart     Comment 2 Notify RN    GLUCOSE, CAPILLARY     Status: Abnormal   Collection Time    01/08/13  9:39 PM      Result Value Range   Glucose-Capillary 154 (*) 70 - 99 mg/dL  GLUCOSE, CAPILLARY     Status: Abnormal   Collection Time    01/09/13  6:01 AM      Result Value Range   Glucose-Capillary 129 (*) 70 - 99 mg/dL  GLUCOSE, CAPILLARY     Status: Abnormal   Collection Time    01/09/13 11:48 AM      Result Value Range   Glucose-Capillary 117 (*) 70 - 99 mg/dL  GLUCOSE, CAPILLARY     Status: Abnormal   Collection Time    01/09/13  5:14 PM      Result Value Range   Glucose-Capillary 100 (*) 70 - 99 mg/dL  GLUCOSE, CAPILLARY     Status: Abnormal   Collection Time    01/09/13  8:57 PM      Result Value Range   Glucose-Capillary 149 (*) 70 - 99 mg/dL  GLUCOSE, CAPILLARY     Status: Abnormal   Collection Time    01/10/13  6:13 AM      Result Value Range   Glucose-Capillary 132 (*) 70 - 99 mg/dL    Physical Findings: AIMS: Facial and Oral Movements Muscles of Facial Expression: None, normal Lips and Perioral Area: None, normal Jaw: None, normal Tongue: None, normal,Extremity Movements Upper (arms, wrists, hands, fingers): None, normal Lower (legs, knees, ankles, toes): None, normal, Trunk Movements Neck, shoulders, hips: None, normal, Overall  Severity Severity of abnormal movements (highest score from questions above): None, normal Incapacitation due to abnormal movements: None, normal Patient's  awareness of abnormal movements (rate only patient's report): No Awareness, Dental Status Current problems with teeth and/or dentures?: Yes Does patient usually wear dentures?: No  CIWA:    COWS:     Treatment Plan Summary: Daily contact with patient to assess and evaluate symptoms and progress in treatment Medication management  Plan:1. Admit for crisis management and stabilization. 2. Medication management to reduce current symptoms to base line and improve the     patient's overall level of functioning 3. Treat health problems as indicated. 4. Develop treatment plan to decrease risk of relapse upon discharge and the need for     readmission. 5. Psycho-social education regarding relapse prevention and self care. 6. Health care follow up as needed for medical problems. 7. Will continue Neurontin to 400mg  po TID for mood lability  8. Continue current treatment modality.   Medical Decision Making Problem Points:  Established problem, stable/improving (1), Review of last therapy session (1) and Review of psycho-social stressors (1) Data Points:  Order Aims Assessment (2) Review of medication regiment & side effects (2) Review of new medications or change in dosage (2)  I certify that inpatient services furnished can reasonably be expected to improve the patient's condition.   ARFEEN,SYED T.,MD 01/10/2013, 10:20 AM

## 2013-01-10 NOTE — Progress Notes (Signed)
The focus of this group is to help patients review their daily goal of treatment and discuss progress on daily workbooks. Pt attended the evening group session and responded to all discussion prompts from the [facility]. Pt reported having a good day, the highlight of which was being able to go to the courtyard and "enjoy the fresh air." Pt stated his needs were being met by Nursing Staff and had no additional needs or requests at the moment. Pt's affect was appropriate and Pt seemed engaged in the group when directly speaking/being spoken to.

## 2013-01-11 LAB — GLUCOSE, CAPILLARY: Glucose-Capillary: 118 mg/dL — ABNORMAL HIGH (ref 70–99)

## 2013-01-11 NOTE — Progress Notes (Signed)
Writer observed patient sitting in the dayroom watching tv. Patient reports that he has had a good day and that he is good. Patient requested his hs medications which he received. Patient voiced no complaints. Patient denies si/hi/a/v hallucinations. safety maintained on unit with 15 min checks, will continue to monitor.

## 2013-01-11 NOTE — Progress Notes (Signed)
Patient ID: Ivan [name], male   DOB: 05/19/1961, 52 y.o.   MRN: [id number] Psychoeducational Group Note  Date:  01/11/2013 Time:  1000am  Group Topic/Focus:  Making Healthy Choices:   The focus of this group is to help patients identify negative/unhealthy choices they were using prior to admission and identify positive/healthier coping strategies to replace them upon discharge.  Participation Level:  Active  Participation Quality:  Appropriate  Affect:  Blunted  Cognitive:  Disorganized  Insight:  Supportive  Engagement in Group:  Supportive  Additional Comments:  Psychoeducational group   Pricilla Larsson 01/11/2013,10:43 AM

## 2013-01-11 NOTE — Progress Notes (Signed)
[facility] MD Progress Note  01/11/2013 9:31 AM Ivan [name]  MRN:  [id number] Subjective: I am doing so-so.   Objective: Patient continued to endorse improvement in his sleep however he is still appears irritable sometimes.  He is compliant with her medication.  As the staff he is not a management problem.  He follows through pain.  He attended some program.  He is still believe that his family is spying on him and wants them to stay out of his life.  He gets very anxious about his family.  He denies any tremors or shakes.    Diagnosis:  Axis I: Chronic Paranoid Schizophrenia  ADL's:  Intact  Sleep: Poor  Appetite:  Fair  Suicidal Ideation:  Plan:  denies Intent:  denies Means:  denies Homicidal Ideation:  Plan:  denies Intent:  denies Means:  denies AEB (as evidenced by):  Psychiatric Specialty Exam: Review of Systems  Constitutional: Negative.   HENT: Negative.   Respiratory: Negative.   Gastrointestinal: Negative.   Genitourinary: Negative.   Musculoskeletal: Positive for myalgias and joint pain.  Skin: Negative.   Neurological: Negative.   Endo/Heme/Allergies: Negative.   Psychiatric/Behavioral: The patient is nervous/anxious.     Blood pressure 143/88, pulse 86, temperature 97.5 F (36.4 C), temperature source Oral, resp. rate 16, height 6\' 3"  (1.905 m), weight 136.533 kg (301 lb).Body mass index is 37.62 kg/(m^2).  General Appearance: Fairly Groomed  Engineer, water::  Minimal  Speech:  Clear and Coherent  Volume:  Normal  Mood:  Irritable  Affect:  Blunt  Thought Process:  Disorganized  Orientation:  Full (Time, Place, and Person)  Thought Content:  Delusions and Paranoid Ideation  Suicidal Thoughts:  No  Homicidal Thoughts:  No  Memory:  Immediate;   Fair Recent;   Fair Remote;   Fair  Judgement:  Poor  Insight:  Lacking  Psychomotor Activity:  Normal  Concentration:  Fair  Recall:  Fair  Akathisia:  No  Handed:  Right  AIMS (if indicated):     Assets:   Desire for Improvement Housing Social Support Transportation  Sleep:  Number of Hours: 3   Current Medications: Current Facility-Administered Medications  Medication Dose Route Frequency Provider Last Rate Last Dose  . acetaminophen (TYLENOL) tablet 650 mg  650 mg Oral Q6H PRN Laverle Hobby, PA-C      . alum & mag hydroxide-simeth (MAALOX/MYLANTA) 200-200-20 MG/5ML suspension 30 mL  30 mL Oral Q4H PRN Laverle Hobby, PA-C      . amLODipine (NORVASC) tablet 5 mg  5 mg Oral BID Laverle Hobby, PA-C   5 mg at 01/11/13 0830  . aspirin EC tablet 81 mg  81 mg Oral Daily Laverle Hobby, PA-C   81 mg at 01/11/13 0830  . benztropine (COGENTIN) tablet 1 mg  1 mg Oral Daily Laverle Hobby, PA-C   1 mg at 01/11/13 0830  . cloNIDine (CATAPRES) tablet 0.1 mg  0.1 mg Oral Q8H PRN Mojeed Akintayo   0.1 mg at 01/09/13 2054  . furosemide (LASIX) tablet 40 mg  40 mg Oral BID Laverle Hobby, PA-C   40 mg at 01/11/13 0830  . gabapentin (NEURONTIN) capsule 400 mg  400 mg Oral TID Mojeed Akintayo   400 mg at 01/11/13 0830  . hydrALAZINE (APRESOLINE) tablet 100 mg  100 mg Oral QID Laverle Hobby, PA-C   100 mg at 01/11/13 0830  . ibuprofen (ADVIL,MOTRIN) tablet 600 mg  600 mg Oral  Q6H PRN Laverle Hobby, PA-C   600 mg at 01/08/13 0739  . insulin aspart (novoLOG) injection 0-20 Units  0-20 Units Subcutaneous TID WC Laverle Hobby, PA-C   3 Units at 01/11/13 [phone]  . insulin aspart (novoLOG) injection 0-5 Units  0-5 Units Subcutaneous QHS Spencer E Simon, PA-C      . magnesium hydroxide (MILK OF MAGNESIA) suspension 30 mL  30 mL Oral Daily PRN Laverle Hobby, PA-C      . metFORMIN (GLUCOPHAGE) tablet 500 mg  500 mg Oral BID WC Laverle Hobby, PA-C   500 mg at 01/11/13 0830  . nicotine polacrilex (NICORETTE) gum 2 mg  2 mg Oral PRN Laverle Hobby, PA-C   2 mg at 01/11/13 [id number]  . potassium chloride (K-DUR) CR tablet 10 mEq  10 mEq Oral BID Laverle Hobby, PA-C   10 mEq at 01/11/13 0831  . thiothixene  (NAVANE) capsule 15 mg  15 mg Oral Daily Laverle Hobby, PA-C   15 mg at 01/11/13 0831  . traZODone (DESYREL) tablet 50 mg  50 mg Oral QHS,MR X 1 Laverle Hobby, PA-C   50 mg at 01/10/13 2052    Lab Results:  Results for orders placed during the hospital encounter of 01/08/13 (from the past 48 hour(s))  GLUCOSE, CAPILLARY     Status: Abnormal   Collection Time    01/09/13 11:48 AM      Result Value Range   Glucose-Capillary 117 (*) 70 - 99 mg/dL  GLUCOSE, CAPILLARY     Status: Abnormal   Collection Time    01/09/13  5:14 PM      Result Value Range   Glucose-Capillary 100 (*) 70 - 99 mg/dL  GLUCOSE, CAPILLARY     Status: Abnormal   Collection Time    01/09/13  8:57 PM      Result Value Range   Glucose-Capillary 149 (*) 70 - 99 mg/dL  GLUCOSE, CAPILLARY     Status: Abnormal   Collection Time    01/10/13  6:13 AM      Result Value Range   Glucose-Capillary 132 (*) 70 - 99 mg/dL  GLUCOSE, CAPILLARY     Status: Abnormal   Collection Time    01/10/13 11:51 AM      Result Value Range   Glucose-Capillary 117 (*) 70 - 99 mg/dL   Comment 1 Documented in Chart     Comment 2 Notify RN    GLUCOSE, CAPILLARY     Status: Abnormal   Collection Time    01/10/13  5:11 PM      Result Value Range   Glucose-Capillary 132 (*) 70 - 99 mg/dL   Comment 1 Documented in Chart     Comment 2 Notify RN    GLUCOSE, CAPILLARY     Status: Abnormal   Collection Time    01/10/13  8:43 PM      Result Value Range   Glucose-Capillary 106 (*) 70 - 99 mg/dL   Comment 1 Notify RN    GLUCOSE, CAPILLARY     Status: Abnormal   Collection Time    01/11/13  6:20 AM      Result Value Range   Glucose-Capillary 126 (*) 70 - 99 mg/dL   Comment 1 Notify RN      Physical Findings: AIMS: Facial and Oral Movements Muscles of Facial Expression: None, normal Lips and Perioral Area: None, normal Jaw: None, normal Tongue: None, normal,Extremity Movements Upper (  arms, wrists, hands, fingers): None, normal Lower  (legs, knees, ankles, toes): None, normal, Trunk Movements Neck, shoulders, hips: None, normal, Overall Severity Severity of abnormal movements (highest score from questions above): None, normal Incapacitation due to abnormal movements: None, normal Patient's awareness of abnormal movements (rate only patient's report): No Awareness, Dental Status Current problems with teeth and/or dentures?: Yes Does patient usually wear dentures?: No  CIWA:    COWS:     Treatment Plan Summary: Daily contact with patient to assess and evaluate symptoms and progress in treatment Medication management  Plan:1. Admit for crisis management and stabilization. 2. Medication management to reduce current symptoms to base line and improve the     patient's overall level of functioning 3. Treat health problems as indicated. 4. Develop treatment plan to decrease risk of relapse upon discharge and the need for     readmission. 5. Psycho-social education regarding relapse prevention and self care. 6. Health care follow up as needed for medical problems. 7. Will continue Neurontin to 400mg  po TID for mood lability  8. Continue current treatment modality.   Medical Decision Making Problem Points:  Established problem, stable/improving (1), Review of last therapy session (1) and Review of psycho-social stressors (1) Data Points:  Order Aims Assessment (2) Review of medication regiment & side effects (2) Review of new medications or change in dosage (2)  I certify that inpatient services furnished can reasonably be expected to improve the patient's condition.   Lauraann Missey T.,MD 01/11/2013, 9:31 AM

## 2013-01-11 NOTE — [facility] Group Notes (Signed)
[facility] Group Notes:  (Clinical Social Work)  01/11/2013   11:15-11:45AM  Summary of Progress/Problems:  The main focus of today's process group was to listen to a variety of genres of music and to identify that different types of music provoke different responses.  The patient then was able to identify personally what was soothing for them, as well as energizing.  Handouts were used to record feelings evoked, as well as how patient can personally use this knowledge in sleep habits, with depression, and with other symptoms.  The patient expressed understanding and enjoyment of how various kinds of music made him feel.  Type of Therapy:  Music Therapy   Participation Level:  Active  Participation Quality:  Attentive and Sharing  Affect:  Appropriate and Blunted  Cognitive:  Appropriate and Oriented  Insight:  Engaged  Engagement in Therapy:  Engaged  Modes of Intervention:   Activity, Exploration  Selmer Dominion, LCSW 01/11/2013, 12:43 PM

## 2013-01-11 NOTE — Progress Notes (Signed)
Patient ID: Ivan [name], male   DOB: March 22, 1961, 52 y.o.   MRN: [id number] 01-11-13 nursing shift note: D: pt has been visible in the milieu, going to groups and taking his medications. He stated that his sleep is improving and has been very pleasant interacting with staff and peers. A: staff continue to encourage and support. R: he denies any si/hi/av. On his inventory sheet he wrote; slept fair, appetite good, energy normal, attention good with his depression at 9 and his hopelessness at 10. His having some agitation. His discharge plans after discharge is to "take care health", he said. RN will monitor and Q 15 min ck's continue.

## 2013-01-12 LAB — GLUCOSE, CAPILLARY
Glucose-Capillary: 123 mg/dL — ABNORMAL HIGH (ref 70–99)
Glucose-Capillary: 129 mg/dL — ABNORMAL HIGH (ref 70–99)

## 2013-01-12 MED ORDER — CLONIDINE HCL 0.1 MG PO TABS
0.1000 mg | ORAL_TABLET | Freq: Two times a day (BID) | ORAL | Status: DC
  Administered 2013-01-12 – 2013-01-14 (×4): 0.1 mg via ORAL
  Filled 2013-01-12 (×8): qty 1

## 2013-01-12 MED ORDER — CLONIDINE HCL 0.1 MG PO TABS
0.1000 mg | ORAL_TABLET | Freq: Two times a day (BID) | ORAL | Status: DC
  Filled 2013-01-12 (×4): qty 1

## 2013-01-12 MED ORDER — CLONIDINE HCL 0.1 MG PO TABS: 0.1000 mg | ORAL_TABLET | Freq: Every day | ORAL | Status: DC

## 2013-01-12 NOTE — Progress Notes (Signed)
[facility] MD Progress Note  01/12/2013 12:58 PM Ivan [name]  MRN:  [id number] Subjective:  "I'm doing good, very good." Patient reports no new complaints. Objective: The patient is pleasant cooperative and informative. He makes good eye contact and his speech is clear and coherent.  He notes moderate depression and minimal anxiety. Diagnosis:  Paranoid schizophrenia, chronic condition   ADL's:  Intact  Sleep: Poor  Appetite:  Good  Suicidal Ideation:  denies Homicidal Ideation:  denies AEB (as evidenced by): observation, patient's report of decreasing symptoms, improved appetite and sleep.  Psychiatric Specialty Exam: Review of Systems  Constitutional: Negative.  Negative for fever, chills, weight loss, malaise/fatigue and diaphoresis.  HENT: Negative for congestion and sore throat.   Eyes: Negative for blurred vision, double vision and photophobia.  Respiratory: Negative for cough, shortness of breath and wheezing.   Cardiovascular: Negative for chest pain, palpitations and PND.  Gastrointestinal: Negative for heartburn, nausea, vomiting, abdominal pain, diarrhea and constipation.  Musculoskeletal: Negative for myalgias, joint pain and falls.  Neurological: Negative for dizziness, tingling, tremors, sensory change, speech change, focal weakness, seizures, loss of consciousness, weakness and headaches.  Endo/Heme/Allergies: Negative for polydipsia. Does not bruise/bleed easily.  Psychiatric/Behavioral: Positive for depression and hallucinations. Negative for suicidal ideas, memory loss and substance abuse. The patient is nervous/anxious. The patient does not have insomnia.     Blood pressure 126/82, pulse 86, temperature 98.9 F (37.2 C), temperature source Oral, resp. rate 18, height 6\' 3"  (1.905 m), weight 136.533 kg (301 lb).Body mass index is 37.62 kg/(m^2).  General Appearance: Casual  Eye Contact::  Good  Speech:  Normal Rate  Volume:  Normal  Mood:  Anxious and Depressed   Affect:  Congruent  Thought Process:  Circumstantial  Orientation:  Full (Time, Place, and Person)  Thought Content:  Hallucinations: Auditory  Suicidal Thoughts:  No  Homicidal Thoughts:  No  Memory:  Immediate;   Fair  Judgement:  Impaired  Insight:  Lacking  Psychomotor Activity:  Normal  Concentration:  Fair  Recall:  Fair  Akathisia:  No  Handed:  Right  AIMS (if indicated):     Assets:  Communication Skills Desire for Improvement Housing Social Support  Sleep:  Number of Hours: 3   Current Medications: Current Facility-Administered Medications  Medication Dose Route Frequency Provider Last Rate Last Dose  . acetaminophen (TYLENOL) tablet 650 mg  650 mg Oral Q6H PRN Laverle Hobby, PA-C      . alum & mag hydroxide-simeth (MAALOX/MYLANTA) 200-200-20 MG/5ML suspension 30 mL  30 mL Oral Q4H PRN Laverle Hobby, PA-C      . amLODipine (NORVASC) tablet 5 mg  5 mg Oral BID Laverle Hobby, PA-C   5 mg at 01/12/13 0816  . aspirin EC tablet 81 mg  81 mg Oral Daily Laverle Hobby, PA-C   81 mg at 01/12/13 0816  . benztropine (COGENTIN) tablet 1 mg  1 mg Oral Daily Laverle Hobby, PA-C   1 mg at 01/12/13 0817  . cloNIDine (CATAPRES) tablet 0.1 mg  0.1 mg Oral BID Nena Polio, PA-C      . furosemide (LASIX) tablet 40 mg  40 mg Oral BID Laverle Hobby, PA-C   40 mg at 01/12/13 0816  . gabapentin (NEURONTIN) capsule 400 mg  400 mg Oral TID Mojeed Akintayo   400 mg at 01/12/13 1155  . hydrALAZINE (APRESOLINE) tablet 100 mg  100 mg Oral QID Laverle Hobby, PA-C   100  mg at 01/12/13 1155  . ibuprofen (ADVIL,MOTRIN) tablet 600 mg  600 mg Oral Q6H PRN Laverle Hobby, PA-C   600 mg at 01/08/13 0739  . insulin aspart (novoLOG) injection 0-20 Units  0-20 Units Subcutaneous TID WC Laverle Hobby, PA-C   3 Units at 01/12/13 1156  . insulin aspart (novoLOG) injection 0-5 Units  0-5 Units Subcutaneous QHS Spencer E Simon, PA-C      . magnesium hydroxide (MILK OF MAGNESIA) suspension 30 mL   30 mL Oral Daily PRN Laverle Hobby, PA-C      . metFORMIN (GLUCOPHAGE) tablet 500 mg  500 mg Oral BID WC Laverle Hobby, PA-C   500 mg at 01/12/13 [id number]  . nicotine polacrilex (NICORETTE) gum 2 mg  2 mg Oral PRN Laverle Hobby, PA-C   2 mg at 01/12/13 1251  . potassium chloride (K-DUR) CR tablet 10 mEq  10 mEq Oral BID Laverle Hobby, PA-C   10 mEq at 01/12/13 0815  . thiothixene (NAVANE) capsule 15 mg  15 mg Oral Daily Laverle Hobby, PA-C   15 mg at 01/12/13 0815  . traZODone (DESYREL) tablet 50 mg  50 mg Oral QHS,MR X 1 Laverle Hobby, PA-C   50 mg at 01/11/13 2121    Lab Results:  Results for orders placed during the hospital encounter of 01/08/13 (from the past 48 hour(s))  GLUCOSE, CAPILLARY     Status: Abnormal   Collection Time    01/10/13  5:11 PM      Result Value Range   Glucose-Capillary 132 (*) 70 - 99 mg/dL   Comment 1 Documented in Chart     Comment 2 Notify RN    GLUCOSE, CAPILLARY     Status: Abnormal   Collection Time    01/10/13  8:43 PM      Result Value Range   Glucose-Capillary 106 (*) 70 - 99 mg/dL   Comment 1 Notify RN    GLUCOSE, CAPILLARY     Status: Abnormal   Collection Time    01/11/13  6:20 AM      Result Value Range   Glucose-Capillary 126 (*) 70 - 99 mg/dL   Comment 1 Notify RN    GLUCOSE, CAPILLARY     Status: Abnormal   Collection Time    01/11/13 11:18 AM      Result Value Range   Glucose-Capillary 130 (*) 70 - 99 mg/dL   Comment 1 Notify RN    GLUCOSE, CAPILLARY     Status: Abnormal   Collection Time    01/11/13  5:10 PM      Result Value Range   Glucose-Capillary 118 (*) 70 - 99 mg/dL   Comment 1 Documented in Chart     Comment 2 Notify RN    GLUCOSE, CAPILLARY     Status: Abnormal   Collection Time    01/12/13 12:16 AM      Result Value Range   Glucose-Capillary 129 (*) 70 - 99 mg/dL  GLUCOSE, CAPILLARY     Status: Abnormal   Collection Time    01/12/13  5:54 AM      Result Value Range   Glucose-Capillary 123 (*) 70 - 99  mg/dL  GLUCOSE, CAPILLARY     Status: Abnormal   Collection Time    01/12/13 11:46 AM      Result Value Range   Glucose-Capillary 130 (*) 70 - 99 mg/dL   Comment 1 Documented in Chart  Comment 2 Notify RN      Physical Findings: AIMS: Facial and Oral Movements Muscles of Facial Expression: None, normal Lips and Perioral Area: None, normal Jaw: None, normal Tongue: None, normal,Extremity Movements Upper (arms, wrists, hands, fingers): None, normal Lower (legs, knees, ankles, toes): None, normal, Trunk Movements Neck, shoulders, hips: None, normal, Overall Severity Severity of abnormal movements (highest score from questions above): None, normal Incapacitation due to abnormal movements: None, normal Patient's awareness of abnormal movements (rate only patient's report): No Awareness, Dental Status Current problems with teeth and/or dentures?: Yes Does patient usually wear dentures?: No  CIWA:    COWS:     Treatment Plan Summary: Daily contact with patient to assess and evaluate symptoms and progress in treatment Medication management  Plan: 1. Continue crisis management and stabilization. 2. Medication management to reduce current symptoms to base line and improve patient's overall level of functioning 3. Treat health problems as indicated. 4. Develop treatment plan to decrease risk of relapse upon discharge and the need for readmission. 5. Psycho-social education regarding relapse prevention and self care. 6. Health care follow up as needed for medical problems. 7. Continue home medications where appropriate. 8. ELOS: 2-3 days. 9. Will increase Clonidine due to elevated blood pressure to 0.1mg  po BID after further discussion with the Pharm D.    Medical Decision Making Problem Points:  Established problem, stable/improving (1), Established problem, worsening (2) and Review of psycho-social stressors (1) Data Points:  Independent review of image, tracing, or specimen  (2) Review of medication regiment & side effects (2) Review of new medications or change in dosage (2)  I certify that inpatient services furnished can reasonably be expected to improve the patient's condition.  Marlane Hatcher. Traylon Schimming RPAC 1:15 PM 01/12/2013

## 2013-01-12 NOTE — Progress Notes (Signed)
Pt observed in the dayroom watching TV with his peers with minimal interaction.  Pt reports he is doing well this evening and has had a fairly good day.  He voices no needs/complaints at this time.  He denies SI/HI/AV.  He only seems agitated when his family situation is mentioned, although he says he has put that behind him.  He says he has gone to groups today.  He is unsure when he will be discharged.  Support and encouragement offered.  Safety maintained with q15 minute checks.

## 2013-01-12 NOTE — Progress Notes (Signed)
Recreation Therapy Notes  Date: 05.26.2014  Time: 9:30am  Location: [address] Dayroom   Group Topic/Focus: Self Expression   Participation Level:  Active   Participation Quality:  Appropriate and Supportive   Affect:  Euthymic  Cognitive:  Oriented   Additional Comments: Activity: Me in 3; Explanation: Patients were asked to identify three characteristics they think represent them and asked to represent these characteristics in words or pictures. Patients were given the option of choosing a genre of music to listen to while completing the task requested. Patients collectively chose R&B music to listen to.   Patient actively participated in group activity. Patient drew a man, a Dentist and a family on his paper. Patient additionally drew a farm. Patient explained drawing to mean that he is a man, he make money which helps his family. All drawings flowed towards farm drawn on patient paper.   Laureen Ochs Terease Marcotte, LRT/CTRS  Seham Gardenhire L 01/12/2013 4:01 PM

## 2013-01-12 NOTE — Progress Notes (Signed)
Patient ID: Ivan [name], male   DOB: 05/31/1961, 52 y.o.   MRN: [id number] D-Patient reports he slept well and that his appetite is good.  His energy level is normal and he rates his depression 9.5 and his hopelessness 8.  He denies any thoughts of self harm and he denies any pain.  A- Asked patient if he was still concerned about the things he thought his family was doing and he says "I let that go".  Abron Haq called to talk with MD per request of Rod N, social worker but did not have code number and said she had done what was requested of her by trying to call and declined to leave a phone number.  Patient's BP has been elevated and he admits he has been eating bacon and sausage.  Encouraged him not to do so.  He also asked why he is in insulin here and not at home.  R- talked with patient about the sliding scale coverage and importance of eating well to keep cbg low. Talked with patient about continuing not to smoke after discharge, but he does not think this is possible.  Encouraged him to consider continuing to use nicorette gum after d/c.

## 2013-01-12 NOTE — Progress Notes (Signed)
Spoke with pt 1:1 who reports having a good day today. Pt appears calm on surface however becomes agitated when family is mentioned. He continues to feel paranoid and that family is intruding on his life. Affect flat, mood depressed. Pt supported, reassured and medicated per orders. Denies pain, problems. No SI/HI/AVH and remains safe. Ivan [name]

## 2013-01-13 LAB — GLUCOSE, CAPILLARY
Glucose-Capillary: 151 mg/dL — ABNORMAL HIGH (ref 70–99)
Glucose-Capillary: 114 mg/dL — ABNORMAL HIGH (ref 70–99)
Glucose-Capillary: 131 mg/dL — ABNORMAL HIGH (ref 70–99)

## 2013-01-13 NOTE — Progress Notes (Signed)
Patient ID: Ivan [name], male   DOB: 29-Jun-1961, 52 y.o.   MRN: [id number] D- Patient reports he slept well and his appetite is good.  His energy level is normal and his ability to pay attention is good.  He is making plans for discharge and says he plans to try to avoid his family as they "treat me like a child".  He has contacted his ex-wife who he says is going to visit today and that they are friends and she is a supportive person to him. A- Talked with patient about importance of following up with his medical MD about his blood pressure as additional medication has been started here.  R- Patient says he will have an appointment within 2 weeks of discharge.

## 2013-01-13 NOTE — Clinical Social Work Note (Signed)
  Type of Therapy: Process Group Therapy  Participation Level:  Active  Participation Quality:  Attentive  Affect:  Flat  Cognitive:  Oriented  Insight:  Improving  Engagement in Group:  Engaged  Engagement in Therapy:  Improving  Modes of Intervention:  Activity, Clarification, Education, Problem-solving and Support  Summary of Progress/Problems: Today's group addressed the issue of overcoming obstacles.  Patients were asked to identify their biggest obstacle post d/c that stands in the way of their on-going success, and then problem solve as to how to manage this.  Ivan [name] identifies his biggest obstacle as his family, and his solution is to "slow down and let it go."  He states this means he just needs to give up trying to have control over the situation, and go his own way by finding another place to stay that is away from family.  He has a plan to get his own apartment, and feels confident that this will come together as it is meant to be.       Ivan [name] 01/13/2013   3:02 PM

## 2013-01-13 NOTE — Progress Notes (Signed)
Patient ID: Ivan [name], male   DOB: 1960-11-11, 52 y.o.   MRN: [id number] [facility] MD Progress Note  01/13/2013 10:24 AM TAJMIR GLOMB  MRN:  [id number] Subjective:  "I'm doing good, my medication is about right for me now, I am more clear headed than before". Objective: The patient is pleasant and cooperative. He reports decreased delusional thinking, anxiety and depression. He is compliant with his medications and has not endorsed any adverse reactions.  Diagnosis:  Paranoid schizophrenia, chronic condition   ADL's:  Intact  Sleep: fair  Appetite:  Good  Suicidal Ideation:  denies Homicidal Ideation:  denies AEB (as evidenced by): observation, patient's report of decreasing symptoms, improved appetite and sleep.  Psychiatric Specialty Exam: Review of Systems  Constitutional: Negative.  Negative for fever, chills, weight loss, malaise/fatigue and diaphoresis.  HENT: Negative for congestion and sore throat.   Eyes: Negative for blurred vision, double vision and photophobia.  Respiratory: Negative for cough, shortness of breath and wheezing.   Cardiovascular: Negative for chest pain, palpitations and PND.  Gastrointestinal: Negative for heartburn, nausea, vomiting, abdominal pain, diarrhea and constipation.  Musculoskeletal: Negative for myalgias, joint pain and falls.  Neurological: Negative for dizziness, tingling, tremors, sensory change, speech change, focal weakness, seizures, loss of consciousness, weakness and headaches.  Endo/Heme/Allergies: Negative for polydipsia. Does not bruise/bleed easily.  Psychiatric/Behavioral: Negative for suicidal ideas, memory loss and substance abuse. The patient is nervous/anxious. The patient does not have insomnia.     Blood pressure 120/77, pulse 76, temperature 97.7 F (36.5 C), temperature source Oral, resp. rate 20, height 6\' 3"  (1.905 m), weight 136.533 kg (301 lb).Body mass index is 37.62 kg/(m^2).  General Appearance: Casual  Eye  Contact::  Good  Speech:  Normal Rate  Volume:  Normal  Mood:  Anxious  Affect:  Congruent  Thought Process:  Circumstantial  Orientation:  Full (Time, Place, and Person)  Thought Content:  paranoia  Suicidal Thoughts:  No  Homicidal Thoughts:  No  Memory:  Immediate;   Fair  Judgement:  marginal  Insight:  marginal  Psychomotor Activity:  Normal  Concentration:  Fair  Recall:  Fair  Akathisia:  No  Handed:  Right  AIMS (if indicated):     Assets:  Communication Skills Desire for Improvement Housing Social Support  Sleep:  Number of Hours: 4.75   Current Medications: Current Facility-Administered Medications  Medication Dose Route Frequency Provider Last Rate Last Dose  . acetaminophen (TYLENOL) tablet 650 mg  650 mg Oral Q6H PRN Laverle Hobby, PA-C      . alum & mag hydroxide-simeth (MAALOX/MYLANTA) 200-200-20 MG/5ML suspension 30 mL  30 mL Oral Q4H PRN Laverle Hobby, PA-C      . amLODipine (NORVASC) tablet 5 mg  5 mg Oral BID Laverle Hobby, PA-C   5 mg at 01/13/13 0744  . aspirin EC tablet 81 mg  81 mg Oral Daily Laverle Hobby, PA-C   81 mg at 01/13/13 0743  . benztropine (COGENTIN) tablet 1 mg  1 mg Oral Daily Laverle Hobby, PA-C   1 mg at 01/13/13 0744  . cloNIDine (CATAPRES) tablet 0.1 mg  0.1 mg Oral BID Nena Polio, PA-C   0.1 mg at 01/13/13 0743  . furosemide (LASIX) tablet 40 mg  40 mg Oral BID Laverle Hobby, PA-C   40 mg at 01/13/13 0743  . gabapentin (NEURONTIN) capsule 400 mg  400 mg Oral TID Elleana Stillson   400 mg at 01/13/13  [id number]  . hydrALAZINE (APRESOLINE) tablet 100 mg  100 mg Oral QID Laverle Hobby, PA-C   100 mg at 01/13/13 0744  . ibuprofen (ADVIL,MOTRIN) tablet 600 mg  600 mg Oral Q6H PRN Laverle Hobby, PA-C   600 mg at 01/13/13 [id number]  . insulin aspart (novoLOG) injection 0-20 Units  0-20 Units Subcutaneous TID WC Laverle Hobby, PA-C   4 Units at 01/13/13 [phone]  . insulin aspart (novoLOG) injection 0-5 Units  0-5 Units Subcutaneous QHS  Spencer E Simon, PA-C      . magnesium hydroxide (MILK OF MAGNESIA) suspension 30 mL  30 mL Oral Daily PRN Laverle Hobby, PA-C      . metFORMIN (GLUCOPHAGE) tablet 500 mg  500 mg Oral BID WC Laverle Hobby, PA-C   500 mg at 01/13/13 0743  . nicotine polacrilex (NICORETTE) gum 2 mg  2 mg Oral PRN Laverle Hobby, PA-C   2 mg at 01/13/13 1002  . potassium chloride (K-DUR) CR tablet 10 mEq  10 mEq Oral BID Laverle Hobby, PA-C   10 mEq at 01/13/13 0743  . thiothixene (NAVANE) capsule 15 mg  15 mg Oral Daily Laverle Hobby, PA-C   15 mg at 01/13/13 [id number]  . traZODone (DESYREL) tablet 50 mg  50 mg Oral QHS,MR X 1 Laverle Hobby, PA-C   50 mg at 01/12/13 2145    Lab Results:  Results for orders placed during the hospital encounter of 01/08/13 (from the past 48 hour(s))  GLUCOSE, CAPILLARY     Status: Abnormal   Collection Time    01/11/13 11:18 AM      Result Value Range   Glucose-Capillary 130 (*) 70 - 99 mg/dL   Comment 1 Notify RN    GLUCOSE, CAPILLARY     Status: Abnormal   Collection Time    01/11/13  5:10 PM      Result Value Range   Glucose-Capillary 118 (*) 70 - 99 mg/dL   Comment 1 Documented in Chart     Comment 2 Notify RN    GLUCOSE, CAPILLARY     Status: Abnormal   Collection Time    01/12/13 12:16 AM      Result Value Range   Glucose-Capillary 129 (*) 70 - 99 mg/dL  GLUCOSE, CAPILLARY     Status: Abnormal   Collection Time    01/12/13  5:54 AM      Result Value Range   Glucose-Capillary 123 (*) 70 - 99 mg/dL  GLUCOSE, CAPILLARY     Status: Abnormal   Collection Time    01/12/13 11:46 AM      Result Value Range   Glucose-Capillary 130 (*) 70 - 99 mg/dL   Comment 1 Documented in Chart     Comment 2 Notify RN    GLUCOSE, CAPILLARY     Status: Abnormal   Collection Time    01/12/13  4:42 PM      Result Value Range   Glucose-Capillary 130 (*) 70 - 99 mg/dL   Comment 1 Documented in Chart     Comment 2 Notify RN    GLUCOSE, CAPILLARY     Status: Abnormal    Collection Time    01/12/13  9:11 PM      Result Value Range   Glucose-Capillary 123 (*) 70 - 99 mg/dL  GLUCOSE, CAPILLARY     Status: Abnormal   Collection Time    01/13/13  6:03 AM  Result Value Range   Glucose-Capillary 151 (*) 70 - 99 mg/dL    Physical Findings: AIMS: Facial and Oral Movements Muscles of Facial Expression: None, normal Lips and Perioral Area: None, normal Jaw: None, normal Tongue: None, normal,Extremity Movements Upper (arms, wrists, hands, fingers): None, normal Lower (legs, knees, ankles, toes): None, normal, Trunk Movements Neck, shoulders, hips: None, normal, Overall Severity Severity of abnormal movements (highest score from questions above): None, normal Incapacitation due to abnormal movements: None, normal Patient's awareness of abnormal movements (rate only patient's report): No Awareness, Dental Status Current problems with teeth and/or dentures?: No Does patient usually wear dentures?: No  CIWA:    COWS:     Treatment Plan Summary: Daily contact with patient to assess and evaluate symptoms and progress in treatment Medication management  Plan: 1. Continue crisis management and stabilization. 2. Medication management to reduce current symptoms to base line and improve patient's overall level of functioning 3. Treat health problems as indicated. 4. Develop treatment plan to decrease risk of relapse upon discharge and the need for readmission. 5. Psycho-social education regarding relapse prevention and self care. 6. Health care follow up as needed for medical problems. 7. Continue home medications where appropriate. 8. ELOS: 2-3 days. 9. Will continue Clonidine due to elevated blood pressure to 0.1mg  po BID after further discussion with the Pharm D.    Medical Decision Making Problem Points:  Established problem, stable/improving (1), Established problem, worsening (2) and Review of psycho-social stressors (1) Data Points:  Independent  review of image, tracing, or specimen (2) Review of medication regiment & side effects (2) Review of new medications or change in dosage (2)  I certify that inpatient services furnished can reasonably be expected to improve the patient's condition.  Corena Pilgrim, MD 10:24 AM 01/13/2013

## 2013-01-13 NOTE — Progress Notes (Signed)
Pt reports he has had a good day and may be discharged tomorrow.  He plans to find a new place to stay and have minimal interaction with his family.  He has been cooperative and polite while on the unit.  He attends groups and participates.  He is still having problems with his BP being elevated, but is being treated accordingly.  He denies SI/HI/AV.  He voices no needs or concerns at this time.  Support and encouragement offered.  Safety maintained with q15 minute checks.

## 2013-01-13 NOTE — Tx Team (Signed)
  Interdisciplinary Treatment Plan Update   Date Reviewed:  01/13/2013  Time Reviewed:  3:05 PM  Progress in Treatment:   Attending groups: Yes Participating in groups: Yes Taking medication as prescribed: Yes  Tolerating medication: Yes Family/Significant other contact made: Yes  Patient understands diagnosis: Yes  Discussing patient identified problems/goals with staff: Yes Medical problems stabilized or resolved: Yes Denies suicidal/homicidal ideation: Yes Patient has not harmed self or others: Yes  For review of initial/current patient goals, please see plan of care.  Estimated Length of Stay:  1-2 days  Reason for Continuation of Hospitalization: Delusions  Medication stabilization  New Problems/Goals identified:  N/A  Discharge Plan or Barriers:   The locks have been changed at [place].  However, he has a plan B which involves staying in a hotel until the first of the month when he gets his check, and than getting an apartment in [place] that his ex-wife has been checking on for him.  Says ex can also provide transportation home.  Likely d/c tomorrow.  Additional Comments:  Attendees:  Signature: Corena Pilgrim, MD 01/13/2013 3:05 PM   Signature: Ripley Fraise, LCSW 01/13/2013 3:05 PM  Signature: Nena Polio, PA 01/13/2013 3:05 PM  Signature: Thurnell Garbe, RN 01/13/2013 3:05 PM  Signature: Darrol Angel, RN 01/13/2013 3:05 PM  Signature:  01/13/2013 3:05 PM  Signature:   01/13/2013 3:05 PM  Signature:    Signature:    Signature:    Signature:    Signature:    Signature:      Scribe for Treatment Team:   Ripley Fraise, LCSW  01/13/2013 3:05 PM

## 2013-01-14 LAB — BASIC METABOLIC PANEL
CO2: 31 mEq/L (ref 19–32)
Glucose, Bld: 145 mg/dL — ABNORMAL HIGH (ref 70–99)
Potassium: 3.1 mEq/L — ABNORMAL LOW (ref 3.5–5.1)
Sodium: 140 mEq/L (ref 135–145)

## 2013-01-14 LAB — GLUCOSE, CAPILLARY: Glucose-Capillary: 130 mg/dL — ABNORMAL HIGH (ref 70–99)

## 2013-01-14 MED ORDER — FUROSEMIDE 40 MG PO TABS: 40.0000 mg | ORAL_TABLET | Freq: Two times a day (BID) | ORAL | Status: DC

## 2013-01-14 MED ORDER — AMLODIPINE BESYLATE 5 MG PO TABS: 5.0000 mg | ORAL_TABLET | Freq: Two times a day (BID) | ORAL | Status: DC

## 2013-01-14 MED ORDER — THIOTHIXENE 5 MG PO CAPS: 15.0000 mg | ORAL_CAPSULE | Freq: Every day | ORAL | Status: DC

## 2013-01-14 MED ORDER — ASPIRIN EC 81 MG PO TBEC: 81.0000 mg | DELAYED_RELEASE_TABLET | Freq: Every day | ORAL | Status: DC

## 2013-01-14 MED ORDER — GABAPENTIN 400 MG PO CAPS: 400.0000 mg | ORAL_CAPSULE | Freq: Three times a day (TID) | ORAL | Status: DC

## 2013-01-14 MED ORDER — HYDRALAZINE HCL 100 MG PO TABS: 100.0000 mg | ORAL_TABLET | Freq: Four times a day (QID) | ORAL | Status: DC

## 2013-01-14 MED ORDER — CLONIDINE HCL 0.1 MG PO TABS: 0.1000 mg | ORAL_TABLET | Freq: Two times a day (BID) | ORAL | Status: DC

## 2013-01-14 MED ORDER — POTASSIUM CHLORIDE ER 10 MEQ PO TBCR: 10.0000 meq | EXTENDED_RELEASE_TABLET | Freq: Two times a day (BID) | ORAL | Status: DC

## 2013-01-14 MED ORDER — METFORMIN HCL 500 MG PO TABS: 500.0000 mg | ORAL_TABLET | Freq: Two times a day (BID) | ORAL | Status: DC

## 2013-01-14 MED ORDER — BENZTROPINE MESYLATE 1 MG PO TABS: 1.0000 mg | ORAL_TABLET | Freq: Every day | ORAL | Status: DC

## 2013-01-14 NOTE — [facility] Suicide Risk Assessment (Signed)
Suicide Risk Assessment  Discharge Assessment     Demographic Factors:  Male and Unemployed  Mental Status Per Nursing Assessment::   On Admission:  Thoughts of violence towards others (Denies SI, contracts for safety)  Current Mental Status by Physician: patient denies suicidal ideation, intent or plan  Loss Factors: Decrease in vocational status, Decline in physical health and Financial problems/change in socioeconomic status  Historical Factors: Impulsivity  Risk Reduction Factors:   Sense of responsibility to family, Positive social support and Positive therapeutic relationship  Continued Clinical Symptoms:  Schizophrenia:   Paranoid or undifferentiated type  Cognitive Features That Contribute To Risk:  Closed-mindedness Polarized thinking    Suicide Risk:  Minimal: No identifiable suicidal ideation.  Patients presenting with no risk factors but with morbid ruminations; may be classified as minimal risk based on the severity of the depressive symptoms  Discharge Diagnoses:   AXIS I:  Paranoid schizophrenia, chronic condition  AXIS II:  Deferred AXIS III:   Past Medical History  Diagnosis Date  . Diabetes mellitus   . Hypertension    AXIS IV:  other psychosocial or environmental problems and problems related to social environment AXIS V:  61-70 mild symptoms  Plan Of Care/Follow-up recommendations:  Activity:  as tolerated Diet:  healthy Tests:  routine blood work Other:  patient to keep hia after care appointment  Is patient on multiple antipsychotic therapies at discharge:  No   Has Patient had three or more failed trials of antipsychotic monotherapy by history:  No  Recommended Plan for Multiple Antipsychotic Therapies: N/A  Latoyia Tecson,MD 01/14/2013, 9:44 AM

## 2013-01-14 NOTE — [facility] Suicide Risk Assessment (Signed)
[facility] INPATIENT:  Family/Significant Other Suicide Prevention Education  Suicide Prevention Education:  Education Completed; No one has been identified by the patient as the family member/significant other with whom the patient will be residing, and identified as the person(s) who will aid the patient in the event of a mental health crisis (suicidal ideations/suicide attempt).  With written consent from the patient, the family member/significant other has been provided the following suicide prevention education, prior to the and/or following the discharge of the patient.  The suicide prevention education provided includes the following:  Suicide risk factors  Suicide prevention and interventions  National Suicide Hotline telephone number  [facility] assessment telephone number  [facility] [place] and/or Residential Mobile Crisis Unit telephone number  Request made of family/significant other to:  Remove weapons (e.g., guns, rifles, knives), all items previously/currently identified as safety concern.    Remove drugs/medications (over-the-counter, prescriptions, illicit drugs), all items previously/currently identified as a safety concern.  The family member/significant other verbalizes understanding of the suicide prevention education information provided.  The family member/significant other agrees to remove the items of safety concern listed above.  Zaviyar did not endorse SI at time of admission, nor has he c/o SI during his stay.  SPE not required.  [name] 01/14/2013, 10:16 AM

## 2013-01-14 NOTE — Discharge Summary (Signed)
Physician Discharge Summary Note  Patient:  Ivan [name] is an 52 y.o., male MRN:  [id number] DOB:  01/19/1961 Patient phone:  [phone] (home)  Patient address:   [place] [address] [place] [address],   Date of Admission:  01/08/2013 Date of Discharge: 01/14/2013   Reason for Admission:  Paranoid schizophrenia  Discharge Diagnoses: Principal Problem:   Paranoid schizophrenia, chronic condition  Review of Systems  Constitutional: Negative.  Negative for fever, chills, weight loss, malaise/fatigue and diaphoresis.  HENT: Negative for congestion and sore throat.   Eyes: Negative for blurred vision, double vision and photophobia.  Respiratory: Negative for cough, shortness of breath and wheezing.   Cardiovascular: Negative for chest pain, palpitations and PND.  Gastrointestinal: Negative for heartburn, nausea, vomiting, abdominal pain, diarrhea and constipation.  Musculoskeletal: Negative for myalgias, joint pain and falls.  Neurological: Negative for dizziness, tingling, tremors, sensory change, speech change, focal weakness, seizures, loss of consciousness, weakness and headaches.  Endo/Heme/Allergies: Negative for polydipsia. Does not bruise/bleed easily.  Psychiatric/Behavioral: Negative for depression, suicidal ideas, hallucinations, memory loss and substance abuse. The patient is not nervous/anxious and does not have insomnia.   Discharge Diagnoses:  AXIS I: Paranoid schizophrenia, chronic condition  AXIS II: Deferred  AXIS III:  Past Medical History   Diagnosis  Date   .  Diabetes mellitus    .  Hypertension    AXIS IV: other psychosocial or environmental problems and problems related to social environment  AXIS V: 61-70 mild symptoms Level of Care:  OP  Hospital Course:       Ivan [name] is a 52 year old male who was brought to the ED after becoming aggressive and hostile in court. He had taken out restraining orders on his family who he felt were monitoring him by having  cameras put in his home. He had become increasingly paranoid and agitated and filed papers against his family. In court he became unruly and aggressive and was ordered to be placed under IVC by the judge.     On arrival in the ED the patient was found to be paranoid and psychotic.  He was given medical clearance and transferred to [facility] for further stabilization and care. He has a long history of paranoid schizophrenia.  He was given Navane 15mg  for his psychosis and cogentin for prevention of EPS. He was also treated with his regular home medications for his chronic medical problems including hypertension and type 2 Diabetes.   His labs in the ED were unremarkable with the exception of his potassium which was low, likely due to his use of Lasix BID. He was placed on K-dur for this and his blood sugars were checked 4 x a day and his blood pressure was checked each shift. His diastolic blood pressure trended upward and clonidine was started to bring this down. He responded to this well. Ivan [name] was evaluated daily by clinical providers to assess his response to treatment. His progress was noted by his affect, reports of improved mood, appetite, sleep and observation. [name] also completed a daily self inventory to monitor his mood and mental status. He did not have any side effects to report with his medication and did well.     On the day of discharge he was in much improved condition and felt stable for discharge home. He denied SI/HI or AVH and agreed to follow up as planned below.       Consults:  None  Significant Diagnostic Studies:  labs: CBC,  CMP, UDS, UA  Discharge Vitals:   Blood pressure 153/97, pulse 69, temperature 96.7 F (35.9 C), temperature source Oral, resp. rate 20, height 6\' 3"  (1.905 m), weight 136.533 kg (301 lb). Body mass index is 37.62 kg/(m^2). Lab Results:   Results for orders placed during the hospital encounter of 01/08/13 (from the past 72 hour(s))  GLUCOSE, CAPILLARY      Status: Abnormal   Collection Time    01/11/13 11:18 AM      Result Value Range   Glucose-Capillary 130 (*) 70 - 99 mg/dL   Comment 1 Notify RN    GLUCOSE, CAPILLARY     Status: Abnormal   Collection Time    01/11/13  5:10 PM      Result Value Range   Glucose-Capillary 118 (*) 70 - 99 mg/dL   Comment 1 Documented in Chart     Comment 2 Notify RN    GLUCOSE, CAPILLARY     Status: Abnormal   Collection Time    01/12/13 12:16 AM      Result Value Range   Glucose-Capillary 129 (*) 70 - 99 mg/dL  GLUCOSE, CAPILLARY     Status: Abnormal   Collection Time    01/12/13  5:54 AM      Result Value Range   Glucose-Capillary 123 (*) 70 - 99 mg/dL  GLUCOSE, CAPILLARY     Status: Abnormal   Collection Time    01/12/13 11:46 AM      Result Value Range   Glucose-Capillary 130 (*) 70 - 99 mg/dL   Comment 1 Documented in Chart     Comment 2 Notify RN    GLUCOSE, CAPILLARY     Status: Abnormal   Collection Time    01/12/13  4:42 PM      Result Value Range   Glucose-Capillary 130 (*) 70 - 99 mg/dL   Comment 1 Documented in Chart     Comment 2 Notify RN    GLUCOSE, CAPILLARY     Status: Abnormal   Collection Time    01/12/13  9:11 PM      Result Value Range   Glucose-Capillary 123 (*) 70 - 99 mg/dL  GLUCOSE, CAPILLARY     Status: Abnormal   Collection Time    01/13/13  6:03 AM      Result Value Range   Glucose-Capillary 151 (*) 70 - 99 mg/dL  GLUCOSE, CAPILLARY     Status: Abnormal   Collection Time    01/13/13 11:48 AM      Result Value Range   Glucose-Capillary 114 (*) 70 - 99 mg/dL  GLUCOSE, CAPILLARY     Status: Abnormal   Collection Time    01/13/13  4:59 PM      Result Value Range   Glucose-Capillary 131 (*) 70 - 99 mg/dL   Comment 1 Documented in Chart     Comment 2 Notify RN    GLUCOSE, CAPILLARY     Status: Abnormal   Collection Time    01/13/13  9:12 PM      Result Value Range   Glucose-Capillary 139 (*) 70 - 99 mg/dL   Comment 1 Notify RN    BASIC METABOLIC PANEL      Status: Abnormal   Collection Time    01/14/13  6:10 AM      Result Value Range   Sodium 140  135 - 145 mEq/L   Potassium 3.1 (*) 3.5 - 5.1 mEq/L   Chloride 101  96 - 112 mEq/L   CO2 31  19 - 32 mEq/L   Glucose, Bld 145 (*) 70 - 99 mg/dL   BUN 17  6 - 23 mg/dL   Creatinine, Ser 1.30  0.50 - 1.35 mg/dL   Calcium 9.4  8.4 - 10.5 mg/dL   GFR calc non Af Amer 63 (*) >90 mL/min   GFR calc Af Amer 73 (*) >90 mL/min   Comment:            The eGFR has been calculated     using the CKD EPI equation.     This calculation has not been     validated in all clinical     situations.     eGFR's persistently     <90 mL/min signify     possible Chronic Kidney Disease.  GLUCOSE, CAPILLARY     Status: Abnormal   Collection Time    01/14/13  6:18 AM      Result Value Range   Glucose-Capillary 130 (*) 70 - 99 mg/dL    Physical Findings: AIMS: Facial and Oral Movements Muscles of Facial Expression: None, normal Lips and Perioral Area: None, normal Jaw: None, normal Tongue: None, normal,Extremity Movements Upper (arms, wrists, hands, fingers): None, normal Lower (legs, knees, ankles, toes): None, normal, Trunk Movements Neck, shoulders, hips: None, normal, Overall Severity Severity of abnormal movements (highest score from questions above): None, normal Incapacitation due to abnormal movements: None, normal Patient's awareness of abnormal movements (rate only patient's report): No Awareness, Dental Status Current problems with teeth and/or dentures?: No Does patient usually wear dentures?: No  CIWA:    COWS:     Psychiatric Specialty Exam: See Psychiatric Specialty Exam and Suicide Risk Assessment completed by Attending Physician prior to discharge.  Discharge destination:  Home  Is patient on multiple antipsychotic therapies at discharge:  No   Has Patient had three or more failed trials of antipsychotic monotherapy by history:  No  Recommended Plan for Multiple Antipsychotic  Therapies: No  Discharge Orders   Future Orders Complete By Expires     Diet - low sodium heart healthy  As directed     Discharge instructions  As directed     Comments:      Take all of your medications as directed. Be sure to keep all of your follow up appointments.  If you are unable to keep your follow up appointment, call your Doctor's office to let them know, and reschedule.  Make sure that you have enough medication to last until your appointment. Be sure to get plenty of rest. Going to bed at the same time each night will help. Try to avoid sleeping during the day.  Increase your activity as tolerated. Regular exercise will help you to sleep better and improve your mental health. Eating a heart healthy diet is recommended. Try to avoid salty or fried foods. Be sure to avoid all alcohol and illegal drugs.    Increase activity slowly  As directed         Medication List    STOP taking these medications       ALEVE 220 MG tablet  Generic drug:  naproxen sodium     ibuprofen 200 MG tablet  Commonly known as:  ADVIL,MOTRIN      TAKE these medications     Indication   amLODipine 5 MG tablet  Commonly known as:  NORVASC  Take 1 tablet (5 mg total) by mouth 2 (two) times daily.  For high blood pressure.   Indication:  High Blood Pressure     aspirin EC 81 MG tablet  Take 1 tablet (81 mg total) by mouth daily. For platelet aggregation.   Indication:  Blood Clot     benztropine 1 MG tablet  Commonly known as:  COGENTIN  Take 1 tablet (1 mg total) by mouth daily. For prevention of side effects.   Indication:  Extrapyramidal Reaction caused by Medications     cloNIDine 0.1 MG tablet  Commonly known as:  CATAPRES  Take 1 tablet (0.1 mg total) by mouth 2 (two) times daily. For high blood pressure.   Indication:  High Blood Pressure     furosemide 40 MG tablet  Commonly known as:  LASIX  Take 1 tablet (40 mg total) by mouth 2 (two) times daily. For Edema and hypertension.    Indication:  Edema, High Blood Pressure     gabapentin 400 MG capsule  Commonly known as:  NEURONTIN  Take 1 capsule (400 mg total) by mouth 3 (three) times daily. For anxiety and neuropathic pain.   Indication:  Neurogenic Pain, Neuropathic Pain     hydrALAZINE 100 MG tablet  Commonly known as:  APRESOLINE  Take 1 tablet (100 mg total) by mouth 4 (four) times daily. For hypertension.   Indication:  High Blood Pressure     metFORMIN 500 MG tablet  Commonly known as:  GLUCOPHAGE  Take 1 tablet (500 mg total) by mouth 2 (two) times daily with a meal. For glycemic control. Type 2 Diabetes.   Indication:  Type 2 Diabetes     potassium chloride 10 MEQ tablet  Commonly known as:  K-DUR  Take 1 tablet (10 mEq total) by mouth 2 (two) times daily.   Indication:  Low Amount of Potassium in the Blood     thiothixene 5 MG capsule  Commonly known as:  NAVANE  Take 3 capsules (15 mg total) by mouth daily.   Indication:  Psychosis           Follow-up Information   Follow up with [facility] On 02/25/2013. (8AM with Dr Casimiro Needle  I asked them to put you on a cancellation list in hopes that you will be able to be seen sooner than that.)    Contact information:   [place]      Follow-up recommendations:   Activities: Resume activity as tolerated. Diet: Heart healthy low sodium diet Tests: Follow up testing will be determined by your out patient provider. Comments:    Total Discharge Time:  Greater than 30 minutes.  Signed: Marlane Hatcher. Fancy Dunkley RPAC 10:02 AM 01/14/2013

## 2013-01-14 NOTE — Tx Team (Signed)
  Interdisciplinary Treatment Plan Update   Date Reviewed:  01/14/2013  Time Reviewed:  10:13 AM  Progress in Treatment:   Attending groups: Yes Participating in groups: Yes Taking medication as prescribed: Yes  Tolerating medication: Yes Family/Significant other contact made: Yes  Patient understands diagnosis: Yes  Discussing patient identified problems/goals with staff: Yes Medical problems stabilized or resolved: Yes Denies suicidal/homicidal ideation: Yes  In tx team Patient has not harmed self or others: Yes  For review of initial/current patient goals, please see plan of care.  Estimated Length of Stay:  D/C today  Reason for Continuation of Hospitalization:   New Problems/Goals identified:  N/A  Discharge Plan or Barriers:   stay in a hotel, follow up outpt  Additional Comments:  Attendees:  Signature: Corena Pilgrim, MD 01/14/2013 10:13 AM   Signature: Ripley Fraise, LCSW 01/14/2013 10:13 AM  Signature: Nena Polio, PA 01/14/2013 10:13 AM  Signature: Marilynne Halsted, RN 01/14/2013 10:13 AM  Signature: Darrol Angel, RN 01/14/2013 10:13 AM  Signature:  01/14/2013 10:13 AM  Signature:   01/14/2013 10:13 AM  Signature:    Signature:    Signature:    Signature:    Signature:    Signature:      Scribe for Treatment Team:   Ripley Fraise, LCSW  01/14/2013 10:13 AM

## 2013-01-14 NOTE — Progress Notes (Signed)
Patient ID: Ivan [name], male   DOB: Nov 30, 1960, 52 y.o.   MRN: [id number] Patient discharged per physician order; patient denies SI/HI and A/V hallucinations; patient received copy of AVS and prescriptions after it was reviewed; patient had no other concerns or complaints at this time; patient left the unit ambulatory with the sheriff; patient verbalized and signed that he received all his belongings

## 2013-01-14 NOTE — Progress Notes (Addendum)
Patient ID: Ivan [name], male   DOB: Sep 26, 1960, 52 y.o.   MRN: [id number] Patient did not receive samples; patient reported that he has his medications and reports " I will get them filled today; pharmacist is aware that he did not have the samples and did not want to wait

## 2013-01-14 NOTE — Discharge Summary (Signed)
Seen and agreed. Kevyn Wengert, MD 

## 2013-01-14 NOTE — Progress Notes (Signed)
[facility] Adult Case Management Discharge Plan :  Will you be returning to the same living situation after discharge: No. At discharge, do you have transportation home?:Yes,  sheriff Do you have the ability to pay for your medications:Yes,  MCR  Release of information consent forms completed and in the chart;  Patient's signature needed at discharge.  Patient to Follow up at: Follow-up Information   Follow up with [facility] On 02/25/2013. (8AM with Dr Casimiro Needle  I asked them to put you on a cancellation list in hopes that you will be able to be seen sooner than that.)    Contact information:   [place] [phone]      Patient denies SI/HI:   Yes,  yes    Safety Planning and Suicide Prevention discussed:  Yes,  yes  Trish Mage 01/14/2013, 10:12 AM

## 2013-01-14 NOTE — Progress Notes (Signed)
Recreation Therapy Notes  Date: 05.28.2014  Time: 9:30am  Location: [address] Dayroom   Group Topic/Focus: Problem Solving   Participation Level:  Active   Participation Quality:  Appropriate   Affect:  Euthymic  Cognitive:  Appropriate   Additional Comments: Activity: Life Boat ; Explanation: LRT told patients the following scenario: You have charted a yacht for the afternoon, half way through your trip the boat springs a leak. You can saves yourselves as well as 8 of the following people: Shanon Payor, Andree Elk, Pregnant Woman, Male physician, Ex-Marine, Dealer, [place], Ex-[place], [place], Rica Mast, Company secretary, Pharmacist, hospital, Biomedical scientist, Marine scientist.   Patient actively participated in group activity. Patient gave reasoning behind saving individuals he wanted on final list. Patient was able to agree to with group on final list of people to save. Group agreed on saving the following individuals: Shanon Payor, Pregnant Woman, Ex-Marine, [place]ge [place], Pharmacist, hospital, [place], and Nurse. Patient gave logical reasoning for saving the individuals on the group list.   Lane Hacker, LRT/CTRS  Lane Hacker 01/14/2013 2:20 PM

## 2013-01-16 NOTE — Progress Notes (Signed)
Patient Discharge Instructions:  After Visit Summary (AVS):   Faxed to:  01/16/13 Discharge Summary Note:   Faxed to:  01/16/13 Psychiatric Admission Assessment Note:   Faxed to:  01/16/13 Suicide Risk Assessment - Discharge Assessment:   Faxed to:  01/16/13 Faxed/Sent to the Next Level Care provider:  01/16/13 Faxed to [facility] - Dr. Casimiro Needle @ [phone]  Patsey Berthold, 01/16/2013, 3:55 PM

## 2013-02-12 ENCOUNTER — Other Ambulatory Visit (HOSPITAL_COMMUNITY): Payer: Self-pay | Admitting: Physician Assistant

## 2013-02-12 ENCOUNTER — Other Ambulatory Visit (HOSPITAL_COMMUNITY): Payer: Self-pay | Admitting: Psychiatry

## 2013-02-28 ENCOUNTER — Encounter (HOSPITAL_COMMUNITY): Payer: Self-pay

## 2013-02-28 ENCOUNTER — Emergency Department (HOSPITAL_COMMUNITY)
Admission: EM | Admit: 2013-02-28 | Discharge: 2013-02-28 | Disposition: A | Discharge status: Home / Self Care | Payer: Medicare Other | Attending: Emergency Medicine | Admitting: Emergency Medicine

## 2013-02-28 DIAGNOSIS — F209 Schizophrenia, unspecified: Secondary | ICD-10-CM | POA: Insufficient documentation

## 2013-02-28 DIAGNOSIS — E876 Hypokalemia: Secondary | ICD-10-CM | POA: Insufficient documentation

## 2013-02-28 DIAGNOSIS — F319 Bipolar disorder, unspecified: Secondary | ICD-10-CM | POA: Insufficient documentation

## 2013-02-28 DIAGNOSIS — E119 Type 2 diabetes mellitus without complications: Secondary | ICD-10-CM | POA: Insufficient documentation

## 2013-02-28 DIAGNOSIS — F17200 Nicotine dependence, unspecified, uncomplicated: Secondary | ICD-10-CM | POA: Insufficient documentation

## 2013-02-28 DIAGNOSIS — Z7982 Long term (current) use of aspirin: Secondary | ICD-10-CM | POA: Insufficient documentation

## 2013-02-28 DIAGNOSIS — I10 Essential (primary) hypertension: Secondary | ICD-10-CM | POA: Insufficient documentation

## 2013-02-28 DIAGNOSIS — Z79899 Other long term (current) drug therapy: Secondary | ICD-10-CM | POA: Insufficient documentation

## 2013-02-28 LAB — BASIC METABOLIC PANEL
BUN: 12 mg/dL (ref 6–23)
CO2: 29 mEq/L (ref 19–32)
Chloride: 106 mEq/L (ref 96–112)
Creatinine, Ser: 1.43 mg/dL — ABNORMAL HIGH (ref 0.50–1.35)
Glucose, Bld: 159 mg/dL — ABNORMAL HIGH (ref 70–99)
Potassium: 2.9 mEq/L — ABNORMAL LOW (ref 3.5–5.1)

## 2013-02-28 LAB — CBC WITH DIFFERENTIAL/PLATELET
Basophils Relative: 1 % (ref 0–1)
Eosinophils Absolute: 0.2 10*3/uL (ref 0.0–0.7)
HCT: 37 % — ABNORMAL LOW (ref 39.0–52.0)
Hemoglobin: 12.3 g/dL — ABNORMAL LOW (ref 13.0–17.0)
Lymphocytes Relative: 24 % (ref 12–46)
Lymphs Abs: 1.8 10*3/uL (ref 0.7–4.0)
MCH: 29.2 pg (ref 26.0–34.0)
MCHC: 33.2 g/dL (ref 30.0–36.0)
MCV: 87.9 fL (ref 78.0–100.0)
Monocytes Absolute: 0.7 10*3/uL (ref 0.1–1.0)
Neutro Abs: 4.5 10*3/uL (ref 1.7–7.7)
RDW: 15.6 % — ABNORMAL HIGH (ref 11.5–15.5)

## 2013-02-28 LAB — ETHANOL: Alcohol, Ethyl (B): <11 mg/dL (ref 0–11)

## 2013-02-28 MED ORDER — METFORMIN HCL 500 MG PO TABS: 500.0000 mg | ORAL_TABLET | Freq: Two times a day (BID) | ORAL | Status: DC

## 2013-02-28 MED ORDER — POTASSIUM CHLORIDE CRYS ER 20 MEQ PO TBCR
40.0000 meq | EXTENDED_RELEASE_TABLET | Freq: Once | ORAL | Status: AC
  Administered 2013-02-28: 40 meq via ORAL
  Filled 2013-02-28: qty 2

## 2013-02-28 MED ORDER — HYDRALAZINE HCL 25 MG PO TABS
100.0000 mg | ORAL_TABLET | Freq: Four times a day (QID) | ORAL | Status: DC
  Administered 2013-02-28: 100 mg via ORAL
  Filled 2013-02-28 (×6): qty 4

## 2013-02-28 MED ORDER — ONDANSETRON HCL 4 MG PO TABS: 4.0000 mg | ORAL_TABLET | Freq: Three times a day (TID) | ORAL | Status: DC | PRN

## 2013-02-28 MED ORDER — GABAPENTIN 400 MG PO CAPS
400.0000 mg | ORAL_CAPSULE | Freq: Three times a day (TID) | ORAL | Status: DC
  Administered 2013-02-28: 400 mg via ORAL
  Filled 2013-02-28: qty 1

## 2013-02-28 MED ORDER — BENZTROPINE MESYLATE 1 MG PO TABS
1.0000 mg | ORAL_TABLET | Freq: Every day | ORAL | Status: DC
  Administered 2013-02-28: 1 mg via ORAL
  Filled 2013-02-28: qty 1

## 2013-02-28 MED ORDER — AMLODIPINE BESYLATE 5 MG PO TABS
5.0000 mg | ORAL_TABLET | Freq: Every day | ORAL | Status: DC
  Administered 2013-02-28: 5 mg via ORAL
  Filled 2013-02-28: qty 1

## 2013-02-28 MED ORDER — THIOTHIXENE 5 MG PO CAPS
15.0000 mg | ORAL_CAPSULE | Freq: Every day | ORAL | Status: DC
  Administered 2013-02-28: 15 mg via ORAL
  Filled 2013-02-28 (×2): qty 3

## 2013-02-28 MED ORDER — CLONIDINE HCL 0.1 MG PO TABS
0.1000 mg | ORAL_TABLET | Freq: Two times a day (BID) | ORAL | Status: DC
  Administered 2013-02-28: 0.1 mg via ORAL
  Filled 2013-02-28: qty 1

## 2013-02-28 MED ORDER — IBUPROFEN 400 MG PO TABS: 600.0000 mg | ORAL_TABLET | Freq: Three times a day (TID) | ORAL | Status: DC | PRN

## 2013-02-28 MED ORDER — LORAZEPAM 1 MG PO TABS: 1.0000 mg | ORAL_TABLET | ORAL | Status: DC | PRN

## 2013-02-28 MED ORDER — POTASSIUM CHLORIDE CRYS ER 20 MEQ PO TBCR: 20.0000 meq | EXTENDED_RELEASE_TABLET | Freq: Every day | ORAL | Status: DC

## 2013-02-28 MED ORDER — ASPIRIN EC 81 MG PO TBEC
81.0000 mg | DELAYED_RELEASE_TABLET | Freq: Every day | ORAL | Status: DC
  Administered 2013-02-28: 81 mg via ORAL
  Filled 2013-02-28 (×2): qty 1
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
## 2013-02-28 NOTE — ED Notes (Signed)
Pt arrived by [facility]. Is known mental health disorders and "not acting right" per deputy. Pt denies any si/hi

## 2013-03-23 ENCOUNTER — Emergency Department (HOSPITAL_COMMUNITY)
Admission: EM | Admit: 2013-03-23 | Discharge: 2013-03-23 | Discharge status: Against Medical Advice | Payer: Medicare Other | Attending: Emergency Medicine | Admitting: Emergency Medicine

## 2013-03-23 ENCOUNTER — Encounter (HOSPITAL_COMMUNITY): Payer: Self-pay

## 2013-03-23 DIAGNOSIS — F17200 Nicotine dependence, unspecified, uncomplicated: Secondary | ICD-10-CM | POA: Insufficient documentation

## 2013-03-23 DIAGNOSIS — E119 Type 2 diabetes mellitus without complications: Secondary | ICD-10-CM | POA: Insufficient documentation

## 2013-03-23 DIAGNOSIS — I10 Essential (primary) hypertension: Secondary | ICD-10-CM | POA: Insufficient documentation

## 2013-03-23 DIAGNOSIS — G4700 Insomnia, unspecified: Secondary | ICD-10-CM | POA: Insufficient documentation

## 2013-03-23 NOTE — ED Notes (Signed)
Patient to nurse's station upset about wait time. States "I'm just here for my blood pressure medications. I can go to my regular doctor for that". Encouraged patient to stay. Refused. Walked out of ED with steady gait and in no distress. MD notified that patient left.

## 2013-03-23 NOTE — ED Notes (Signed)
Pt c/o insomnia for a year. [facility] office gave patient a ride.

## 2013-03-23 NOTE — ED Provider Notes (Signed)
Pt left before evaluation   Sharyon Cable, MD 03/23/13 2203

## 2013-03-26 ENCOUNTER — Encounter (HOSPITAL_COMMUNITY): Payer: Self-pay

## 2013-03-26 ENCOUNTER — Emergency Department (HOSPITAL_COMMUNITY)
Admission: EM | Admit: 2013-03-26 | Discharge: 2013-03-31 | Disposition: A | Discharge status: Other Acute Inpatient Hospital | Payer: Medicare Other | Attending: Emergency Medicine | Admitting: Emergency Medicine

## 2013-03-26 DIAGNOSIS — R45851 Suicidal ideations: Secondary | ICD-10-CM | POA: Insufficient documentation

## 2013-03-26 DIAGNOSIS — Z79899 Other long term (current) drug therapy: Secondary | ICD-10-CM | POA: Insufficient documentation

## 2013-03-26 DIAGNOSIS — F200 Paranoid schizophrenia: Secondary | ICD-10-CM

## 2013-03-26 DIAGNOSIS — E119 Type 2 diabetes mellitus without complications: Secondary | ICD-10-CM | POA: Insufficient documentation

## 2013-03-26 DIAGNOSIS — F17200 Nicotine dependence, unspecified, uncomplicated: Secondary | ICD-10-CM | POA: Insufficient documentation

## 2013-03-26 DIAGNOSIS — F259 Schizoaffective disorder, unspecified: Secondary | ICD-10-CM

## 2013-03-26 DIAGNOSIS — Z8659 Personal history of other mental and behavioral disorders: Secondary | ICD-10-CM | POA: Insufficient documentation

## 2013-03-26 DIAGNOSIS — F29 Unspecified psychosis not due to a substance or known physiological condition: Secondary | ICD-10-CM

## 2013-03-26 DIAGNOSIS — I10 Essential (primary) hypertension: Secondary | ICD-10-CM | POA: Insufficient documentation

## 2013-03-26 LAB — COMPREHENSIVE METABOLIC PANEL
Albumin: 3.3 g/dL — ABNORMAL LOW (ref 3.5–5.2)
BUN: 15 mg/dL (ref 6–23)
CO2: 25 mEq/L (ref 19–32)
Chloride: 104 mEq/L (ref 96–112)
Creatinine, Ser: 1.69 mg/dL — ABNORMAL HIGH (ref 0.50–1.35)
GFR calc non Af Amer: 46 mL/min — ABNORMAL LOW (ref >90)
Total Bilirubin: 0.6 mg/dL (ref 0.3–1.2)

## 2013-03-26 LAB — URINALYSIS, ROUTINE W REFLEX MICROSCOPIC
Glucose, UA: NEGATIVE mg/dL
Specific Gravity, Urine: >1.03 — ABNORMAL HIGH (ref 1.005–1.030)
pH: 6 (ref 5.0–8.0)

## 2013-03-26 LAB — CBC WITH DIFFERENTIAL/PLATELET
Basophils Relative: 0 % (ref 0–1)
Eosinophils Relative: 2 % (ref 0–5)
HCT: 35.5 % — ABNORMAL LOW (ref 39.0–52.0)
Hemoglobin: 12 g/dL — ABNORMAL LOW (ref 13.0–17.0)
MCHC: 33.8 g/dL (ref 30.0–36.0)
MCV: 87.2 fL (ref 78.0–100.0)
Monocytes Absolute: 1.1 10*3/uL — ABNORMAL HIGH (ref 0.1–1.0)
Monocytes Relative: 10 % (ref 3–12)
Neutro Abs: 8.2 10*3/uL — ABNORMAL HIGH (ref 1.7–7.7)

## 2013-03-26 LAB — URINE MICROSCOPIC-ADD ON

## 2013-03-26 LAB — ETHANOL: Alcohol, Ethyl (B): <11 mg/dL (ref 0–11)

## 2013-03-26 LAB — RAPID URINE DRUG SCREEN, HOSP PERFORMED: Opiates: NOT DETECTED

## 2013-03-26 MED ORDER — ASPIRIN-ACETAMINOPHEN-CAFFEINE 250-250-65 MG PO TABS
1.0000 | ORAL_TABLET | Freq: Every day | ORAL | Status: DC | PRN
  Filled 2013-03-26: qty 1

## 2013-03-26 MED ORDER — HYDRALAZINE HCL 25 MG PO TABS
50.0000 mg | ORAL_TABLET | Freq: Two times a day (BID) | ORAL | Status: DC
  Administered 2013-03-26 – 2013-03-31 (×10): 50 mg via ORAL
  Filled 2013-03-26 (×18): qty 2

## 2013-03-26 MED ORDER — POTASSIUM CHLORIDE CRYS ER 10 MEQ PO TBCR
EXTENDED_RELEASE_TABLET | ORAL | Status: AC
  Filled 2013-03-26: qty 1

## 2013-03-26 MED ORDER — POTASSIUM CHLORIDE CRYS ER 10 MEQ PO TBCR
10.0000 meq | EXTENDED_RELEASE_TABLET | Freq: Two times a day (BID) | ORAL | Status: DC
  Administered 2013-03-26 – 2013-03-31 (×6): 10 meq via ORAL
  Filled 2013-03-26 (×18): qty 1

## 2013-03-26 MED ORDER — ASPIRIN-CAFFEINE 500-32.5 MG PO TABS: 1.0000 | ORAL_TABLET | Freq: Every day | ORAL | Status: DC | PRN

## 2013-03-26 MED ORDER — HYDRALAZINE HCL 25 MG PO TABS
ORAL_TABLET | ORAL | Status: AC
  Filled 2013-03-26: qty 2

## 2013-03-26 NOTE — Consult Note (Signed)
[facility] Psychiatry Consult   Reason for Consult:  Psychosis Referring Physician:  EDMD Dr. Wonda Cerise Ivan [name] is an 52 y.o. male.  Assessment: AXIS Ivan: Paranoid schizophrenia, chronic condition  AXIS II: Deferred  AXIS III:  Past Medical History   Diagnosis  Date   .  Diabetes mellitus    .  Hypertension    AXIS IV: other psychosocial or environmental problems and problems related to social environment  AXIS V: 61-70 mild symptoms Plan:  Recommend psychiatric Inpatient admission when medically cleared.  Subjective:   Ivan [name] is a 52 y.o. male patient BIB family to [facility] wearing nothing but a towel with his clothes in a bag. He stated that he is homeless, and has endless family problems that this provider should be well aware of.  He states he just came in to have his blood drawn and wants his medication so he can go. He repeats over again, "That's all Ivan'm going to say, cause your worrying me!"      He is adamant that we should give him what he wants because "that's what his body needs!"  HPI:  Patient has a history of paranoid schizophrenia and was recently admitted to [facility] in May of this year. He is disorganized, labile, minimally cooperative. He is delusional and shows poor insight and poor judgement. He repeats that we are worrying him and he refuses to answer any further questions and walks away from the monitor.  HPI Elements:   Location:  [facility]. Quality:  Chronic. Severity:  Moderately severe. Timing:  patient refuses to answer. Duration:  years. Context:  patient is currently homeless.  Past Psychiatric History: Past Medical History  Diagnosis Date  . Diabetes mellitus   . Hypertension   . Bipolar 1 disorder   . Schizoaffective disorder     reports that he has been smoking Cigarettes and Cigars.  He has been smoking about 0.50 packs per day. He has never used smokeless tobacco. He reports that he does not drink alcohol or use illicit drugs. No family history on file.          Allergies:  No Known Allergies  Past Psychiatric History: Diagnosis:    Paranoid schizophrenia  Hospitalizations:  several  Outpatient Care:  [facility]  Substance Abuse Care:  none  Self-Mutilation:  none  Suicidal Attempts:  None noted  Violent Behaviors:  None reported   Objective: Blood pressure 136/92, pulse 91, temperature 98.7 F (37.1 C), temperature source Oral, resp. rate 20, SpO2 94.00%.There is no weight on file to calculate BMI. Results for orders placed during the hospital encounter of 03/26/13 (from the past 72 hour(s))  URINE RAPID DRUG SCREEN (HOSP PERFORMED)     Status: None   Collection Time    03/26/13 12:23 PM      Result Value Range   Opiates NONE DETECTED  NONE DETECTED   Cocaine NONE DETECTED  NONE DETECTED   Benzodiazepines NONE DETECTED  NONE DETECTED   Amphetamines NONE DETECTED  NONE DETECTED   Tetrahydrocannabinol NONE DETECTED  NONE DETECTED   Barbiturates NONE DETECTED  NONE DETECTED   Comment:            DRUG SCREEN FOR MEDICAL PURPOSES     ONLY.  IF CONFIRMATION IS NEEDED     FOR ANY PURPOSE, NOTIFY LAB     WITHIN 5 DAYS.                LOWEST DETECTABLE LIMITS  FOR URINE DRUG SCREEN     Drug Class       Cutoff (ng/mL)     Amphetamine      1000     Barbiturate      200     Benzodiazepine   [id number]     Tricyclics       [id number]     Opiates          300     Cocaine          300     THC              50  URINALYSIS, ROUTINE W REFLEX MICROSCOPIC     Status: Abnormal   Collection Time    03/26/13 12:23 PM      Result Value Range   Color, Urine YELLOW  YELLOW   APPearance CLEAR  CLEAR   Specific Gravity, Urine >1.030 (*) 1.005 - 1.030   pH 6.0  5.0 - 8.0   Glucose, UA NEGATIVE  NEGATIVE mg/dL   Hgb urine dipstick LARGE (*) NEGATIVE   Bilirubin Urine SMALL (*) NEGATIVE   Ketones, ur TRACE (*) NEGATIVE mg/dL   Protein, ur >300 (*) NEGATIVE mg/dL   Urobilinogen, UA 0.2  0.0 - 1.0 mg/dL   Nitrite NEGATIVE  NEGATIVE    Leukocytes, UA NEGATIVE  NEGATIVE  URINE MICROSCOPIC-ADD ON     Status: Abnormal   Collection Time    03/26/13 12:23 PM      Result Value Range   Squamous Epithelial / LPF RARE  RARE   WBC, UA 0-2  <3 WBC/hpf   RBC / HPF 0-2  <3 RBC/hpf   Casts HYALINE CASTS (*) NEGATIVE   Comment: GRANULAR CAST  CBC WITH DIFFERENTIAL     Status: Abnormal   Collection Time    03/26/13 12:31 PM      Result Value Range   WBC 11.5 (*) 4.0 - 10.5 K/uL   RBC 4.07 (*) 4.22 - 5.81 MIL/uL   Hemoglobin 12.0 (*) 13.0 - 17.0 g/dL   HCT 35.5 (*) 39.0 - 52.0 %   MCV 87.2  78.0 - 100.0 fL   MCH 29.5  26.0 - 34.0 pg   MCHC 33.8  30.0 - 36.0 g/dL   RDW 15.8 (*) 11.5 - 15.5 %   Platelets 306  150 - 400 K/uL   Neutrophils Relative % 72  43 - 77 %   Neutro Abs 8.2 (*) 1.7 - 7.7 K/uL   Lymphocytes Relative 16  12 - 46 %   Lymphs Abs 1.8  0.7 - 4.0 K/uL   Monocytes Relative 10  3 - 12 %   Monocytes Absolute 1.1 (*) 0.1 - 1.0 K/uL   Eosinophils Relative 2  0 - 5 %   Eosinophils Absolute 0.3  0.0 - 0.7 K/uL   Basophils Relative 0  0 - 1 %   Basophils Absolute 0.0  0.0 - 0.1 K/uL  COMPREHENSIVE METABOLIC PANEL     Status: Abnormal   Collection Time    03/26/13 12:31 PM      Result Value Range   Sodium 141  135 - 145 mEq/L   Potassium 2.9 (*) 3.5 - 5.1 mEq/L   Chloride 104  96 - 112 mEq/L   CO2 25  19 - 32 mEq/L   Glucose, Bld 118 (*) 70 - 99 mg/dL   BUN 15  6 - 23 mg/dL   Creatinine, Ser 1.69 (*) 0.50 -  1.35 mg/dL   Calcium 9.2  8.4 - 10.5 mg/dL   Total Protein 7.4  6.0 - 8.3 g/dL   Albumin 3.3 (*) 3.5 - 5.2 g/dL   AST 90 (*) 0 - 37 U/L   ALT 44  0 - 53 U/L   Alkaline Phosphatase 112  39 - 117 U/L   Total Bilirubin 0.6  0.3 - 1.2 mg/dL   GFR calc non Af Amer 46 (*) >90 mL/min   GFR calc Af Amer 53 (*) >90 mL/min   Comment:            The eGFR has been calculated     using the CKD EPI equation.     This calculation has not been     validated in all clinical     situations.     eGFR's persistently      <90 mL/min signify     possible Chronic Kidney Disease.  ETHANOL     Status: None   Collection Time    03/26/13 12:31 PM      Result Value Range   Alcohol, Ethyl (B) <11  0 - 11 mg/dL   Comment:            LOWEST DETECTABLE LIMIT FOR     SERUM ALCOHOL IS 11 mg/dL     FOR MEDICAL PURPOSES ONLY   Labs are reviewed and are pertinent for negative UDS, <11 BAL.  No current facility-administered medications for this encounter.   Current Outpatient Prescriptions  Medication Sig Dispense Refill  . Aspirin-Caffeine (BAYER BACK & BODY PAIN EX ST) 500-32.5 MG TABS Take 1 tablet by mouth daily as needed (for back pain).      . hydrALAZINE (APRESOLINE) 50 MG tablet Take 50 mg by mouth 2 (two) times daily.      . potassium chloride (K-DUR) 10 MEQ tablet Take 10 mEq by mouth 2 (two) times daily.        Psychiatric Specialty Exam: Physical Exam  ROS  Blood pressure 136/92, pulse 91, temperature 98.7 F (37.1 C), temperature source Oral, resp. rate 20, SpO2 94.00%.There is no weight on file to calculate BMI.  General Appearance: Disheveled  Eye Contact::  Minimal  Speech:  Pressured  Volume:  Increased  Mood:  Irritable  Affect:  Labile  Thought Process:  Disorganized  Orientation:  NA  Thought Content:  Delusions and Paranoid Ideation  Suicidal Thoughts:  No  Homicidal Thoughts:  No  Memory:  Immediate;   Poor  Judgement:  Impaired  Insight:  Lacking  Psychomotor Activity:  Normal  Concentration:  Poor  Recall:  Poor  Akathisia:  No  Handed:  Right  AIMS (if indicated):     Assets:  Resilience  Sleep:      Treatment Plan Summary: 1. Admit for crisis management and stabilization. 2. Medication management to reduce current symptoms to base line and improve the patient's overall level of functioning. 3. Treat health problems as indicated. 4. Develop treatment plan to decrease risk of relapse upon discharge and to reduce the need for readmission. 5. Psycho-social education  regarding relapse prevention and self care. 6. Health care follow up as needed for medical problems. 7. Restart home medications where appropriate. 8. Thiothixene  (Navane) 5mg  3 tablets each day. (15mg ) Daily contact with patient to assess and evaluate symptoms and progress in treatment Medication management Milta Deiters T. Mashburn RPAC 4:05 PM 03/26/2013   Reviewed the information documented and agree with the treatment plan.  Meriah Shands,JANARDHAHA  R. 03/26/2013 6:39 PM

## 2013-03-26 NOTE — ED Notes (Signed)
Ivan [name] with [facility] called no beds at [facility] at this time will send papers to [facility] at this time. [name]

## 2013-03-26 NOTE — ED Notes (Signed)
Sheriff here now serving IVC papers on pt. Contact person is Leighland Misfeldt, his sister at [phone].
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
## 2013-03-26 NOTE — ED Notes (Signed)
Pt dropped off by police. Pt only had a towel wrapped around him, his clothing in a trash bag. States he came here to get his blood done and get his potassium pills. Pt states he has been wandering around. Cannot say where he lives

## 2013-03-26 NOTE — ED Notes (Signed)
Sitter at bedside.

## 2013-03-26 NOTE — ED Notes (Signed)
Pt will not answer questions concerning his complaint, pt states "I just need my blood work done and my meds from [place]", pt will not answer what meds or last dose of meds taken, pt will not answer rather he has SI or HI

## 2013-03-26 NOTE — [facility] Counselor (Signed)
Pt information sent to [facility], pending placement.Ivan [name], AADC 03/26/2013 8:50 PM

## 2013-03-26 NOTE — ED Provider Notes (Signed)
CSN: [id number]     Arrival date & time 03/26/13  1139 History  This chart was scribed for Veryl Speak, MD by Jenne Campus, ED Scribe. This patient was seen in room APA15/APA15 and the patient's care was started at 12:05 PM.   Chief Complaint  Patient presents with  . V70.1   Level 5 Caveat-Pt is uncooperative and hostile  The history is provided by the patient. No language interpreter was used.    HPI Comments: Ivan [name] is a 52 y.o. male who presents to the Emergency Department complaining of "family problems and problems sleeping since 1989". Pt states that he has been seen at [facility], [place] and [facility] with no improvement. He states that he wants to be sent to [facility] and states that he wants "crackers and a soda, draw my blood and send me on to [facility] tomorrow". He admits that he is homeless currently and police dropped him off in nothing but a towel wrapped around him with clothing in a trash bag. When asked any further questions, pt states "You're not getting the picture. Review my records."    Past Medical History  Diagnosis Date  . Diabetes mellitus   . Hypertension   . Bipolar 1 disorder   . Schizoaffective disorder    Past Surgical History  Procedure Laterality Date  . Breast surgery     No family history on file. History  Substance Use Topics  . Smoking status: Current Every Day Smoker -- 0.50 packs/day    Types: Cigarettes, Cigars  . Smokeless tobacco: Never Used  . Alcohol Use: No     Comment: once or twice a month    Review of Systems  Unable to perform ROS: Other    Allergies  Review of patient's allergies indicates no known allergies.  Home Medications   Current Outpatient Rx  Name  Route  Sig  Dispense  Refill  . Aspirin-Caffeine (BAYER BACK & BODY PAIN EX ST) 500-32.5 MG TABS   Oral   Take 1 tablet by mouth daily as needed (for back pain).         . hydrALAZINE (APRESOLINE) 50 MG tablet   Oral   Take 50 mg by  mouth 2 (two) times daily.         . potassium chloride (K-DUR) 10 MEQ tablet   Oral   Take 10 mEq by mouth 2 (two) times daily.          Triage Vitals: BP 136/92  Pulse 91  Resp 20  SpO2 94%  Physical Exam  Nursing note and vitals reviewed. Constitutional: He is oriented to person, place, and time. He appears well-developed and well-nourished. No distress.  HENT:  Head: Normocephalic and atraumatic.  Eyes: Conjunctivae and EOM are normal.  Neck: Normal range of motion. Neck supple. No tracheal deviation present.  Cardiovascular: Normal rate, regular rhythm and normal heart sounds.   No murmur heard. Pulmonary/Chest: Effort normal and breath sounds normal. No respiratory distress. He has no wheezes. He has no rales.  Abdominal: Soft. Bowel sounds are normal. There is no tenderness.  Musculoskeletal: Normal range of motion. He exhibits no edema.  Neurological: He is alert and oriented to person, place, and time. No cranial nerve deficit.  Skin: Skin is warm and dry.  Psychiatric: His affect is angry. His speech is rapid and/or pressured. He is agitated and aggressive. Thought content is paranoid and delusional. He expresses inappropriate judgment.  ED Course   Procedures (including critical care time)  DIAGNOSTIC STUDIES: Oxygen Saturation is 94% on room air, adequate by my interpretation.    COORDINATION OF CARE: 12:09 PM-Discussed treatment plan which includes telepysch, CBC panel, CMP, ethanol and drug screen with pt at bedside and pt agreed to plan.   Labs Reviewed - No data to display No results found. No diagnosis found.  MDM  Patient was brought here by police after being found walking in the street appearing confused. He has a history of schizophrenia and tells me that he is currently homeless. He is very vague when giving a history and only tells me to take his blood and urine and give him some crackers and soda. His thought processes are not appropriate and  he appears to be having a worsening of his schizoaffective disorder. At this point, consultation with psychiatry is in process and the final disposition will be left up to them. Up to this point, he has been cooperative.  I personally performed the services described in this documentation, which was scribed in my presence. The recorded information has been reviewed and is accurate.      Veryl Speak, MD 03/26/13 [phone]

## 2013-03-27 ENCOUNTER — Telehealth (HOSPITAL_COMMUNITY): Payer: Self-pay | Admitting: *Deleted

## 2013-03-27 LAB — GLUCOSE, CAPILLARY: Glucose-Capillary: 117 mg/dL — ABNORMAL HIGH (ref 70–99)

## 2013-03-27 MED ORDER — LORAZEPAM 1 MG PO TABS: 1.0000 mg | ORAL_TABLET | Freq: Three times a day (TID) | ORAL | Status: DC | PRN

## 2013-03-27 MED ORDER — ACETAMINOPHEN 325 MG PO TABS: 325.0000 mg | ORAL_TABLET | Freq: Every day | ORAL | Status: DC | PRN

## 2013-03-27 MED ORDER — ALUM & MAG HYDROXIDE-SIMETH 200-200-20 MG/5ML PO SUSP: 30.0000 mL | ORAL | Status: DC | PRN

## 2013-03-27 MED ORDER — THIOTHIXENE 5 MG PO CAPS
15.0000 mg | ORAL_CAPSULE | Freq: Every day | ORAL | Status: DC
  Filled 2013-03-27 (×8): qty 3

## 2013-03-27 MED ORDER — NICOTINE 21 MG/24HR TD PT24: 21.0000 mg | MEDICATED_PATCH | Freq: Every day | TRANSDERMAL | Status: DC | PRN

## 2013-03-27 MED ORDER — ASPIRIN 325 MG PO TABS: 325.0000 mg | ORAL_TABLET | Freq: Every day | ORAL | Status: DC | PRN

## 2013-03-27 MED ORDER — ACETAMINOPHEN 325 MG PO TABS
650.0000 mg | ORAL_TABLET | ORAL | Status: DC | PRN
  Administered 2013-03-27: 325 mg via ORAL
  Administered 2013-03-28: 650 mg via ORAL
  Filled 2013-03-27 (×2): qty 2

## 2013-03-27 MED ORDER — METFORMIN HCL 500 MG PO TABS
500.0000 mg | ORAL_TABLET | Freq: Two times a day (BID) | ORAL | Status: DC
  Administered 2013-03-27 – 2013-03-28 (×2): 500 mg via ORAL
  Filled 2013-03-27 (×2): qty 1

## 2013-03-27 MED ORDER — ZOLPIDEM TARTRATE 5 MG PO TABS
5.0000 mg | ORAL_TABLET | Freq: Every evening | ORAL | Status: DC | PRN
  Filled 2013-03-27: qty 1

## 2013-03-27 MED ORDER — ONDANSETRON HCL 4 MG PO TABS: 4.0000 mg | ORAL_TABLET | Freq: Three times a day (TID) | ORAL | Status: DC | PRN

## 2013-03-27 MED ORDER — IBUPROFEN 400 MG PO TABS
400.0000 mg | ORAL_TABLET | Freq: Three times a day (TID) | ORAL | Status: DC | PRN
  Filled 2013-03-27: qty 1

## 2013-03-27 NOTE — ED Notes (Signed)
Not able to assess if pt having SI/HI. Pt will not answer assessment questions and is refusing to take a shower.  Pt reports "I need to talk with the [place] department, so they can take me home." Tried to explain to pt IVC process. Pt interrupts staff in conversation. Pt agitated at time of assessment. Charge RN tried to assess pt. Pt still not very interactive with staff.

## 2013-03-27 NOTE — ED Notes (Signed)
Pt walked out of pt room with sitter. Pt standing at nurses station, refusing to not go back into his room. Pt reports "I  don't like the care I am receiving here, I want to go to [facility]." "I will go back in my room once I talk to the [facility]." [facility] called and en route. Charge Air traffic controller in department and aware of pt situation.

## 2013-03-27 NOTE — ED Notes (Signed)
Pt up to bathroom at this time

## 2013-03-27 NOTE — ED Notes (Addendum)
Called behavioral health to get update on pt placement. Talked with Tom at [facility]. Tom reported would look into pt case and call back with information once he was updated.

## 2013-03-27 NOTE — BH Assessment (Signed)
[place] Assessment Progress Note   Called [facility] regarding referral and spoke to Hoehne.  He reports that pt has been declined by Dr Alcide Clever for "lack of acuity."  Jalene Mullet, MA Assessment Counselor

## 2013-03-27 NOTE — BH Assessment (Addendum)
Behavioral Health Assessment  This Probation officer received a call from Anderson Malta, Therapist, sports, the nurse taking care of this patient, inquiring about disposition.  After reviewing EPIC notes, it was found that pt was to be referred to [facility].  At 11:20 I called [facility] and spoke to Whitney.  She reports that they have not received a referral.  She requested that IVC paperwork, ED note, [facility] assessment and psychiatry consult notes, labs, and demographics be sent.  I printed out the requested documentation and will fax it as soon as I finish entering this note.  I also spoke to [place] at [facility] about the pt's status and requested that she fax IVC paperwork directly to [facility], given that hard copies of faxes cannot currently be printed.  To this she agreed.  Jalene Mullet, MA Assessment Counselor 03/27/2013 @ 11:34  After several failed attempts, I re-faxed information to an alternative fax number, [phone].  At 13:10 I spoke to [place] at [facility] and confirmed receipt.  At this time their decision is pending.  Jalene Mullet, MA Assessment Counselor 03/27/2013 @ 13:20
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
## 2013-03-27 NOTE — ED Notes (Signed)
Patient ambulated to bathroom; steady gait.  Voices no concerns at this time.

## 2013-03-27 NOTE — ED Notes (Signed)
pt very paranoid and pacing in room. pt refused to take navane. pt reports "that won't go down." pt given a cup of ice water to take meds with and pt poured water out and got his own water from the faucet in his room. pt took other meds well.

## 2013-03-27 NOTE — ED Notes (Addendum)
Went in to check on pt and asked him how he was doing. Pt reports "dont worry about it, you are just being nosey."

## 2013-03-27 NOTE — ED Notes (Addendum)
Pt sister, Hilda Blades, called and inquired about the location of pt rental car and if pt checked out of hotel prior to being IVC. Pt sister put on hold. RN asked pt if could share information with sister about rental car and hotel. Pt gave verbal permission. Pt reported rental car was at [facility] with keys in it and that he checked out of the hotel prior to IVC. Pt sister informed and verbalized understanding.

## 2013-03-27 NOTE — ED Notes (Signed)
Tom from [facility] reported that [facility] had not received any paperwork concerning pt. Tom reported that he would fax over the paperwork he had and that the IVC paperwork from [facility] needed to be faxed. EDP secretary in the process of faxing documentation over to [facility].

## 2013-03-27 NOTE — ED Notes (Signed)
Pharmacy contacted and informed that potassium CR is not in ED pyxis. Pharmacy reported potassium CR is not available anymore. Pharmacy give verbal order to give Potassium ER. Pt given potassium 10 meq ER. Pt tolerated well.

## 2013-03-27 NOTE — ED Notes (Signed)
Patient states that he asked for a fluid pill and have not seen it yet. Told patient that I would speak with his nurse about it.

## 2013-03-27 NOTE — ED Notes (Signed)
[employer] called  To assist until [employer] could get here.  Security standing by.

## 2013-03-27 NOTE — ED Notes (Signed)
Patient states "I have a boil on my foot and it hurts. Can I have some aspirin or tylenol for it?" Advised MD.

## 2013-03-28 LAB — GLUCOSE, CAPILLARY: Glucose-Capillary: 98 mg/dL (ref 70–99)

## 2013-03-28 MED ORDER — QUETIAPINE FUMARATE 25 MG PO TABS
ORAL_TABLET | ORAL | Status: AC
  Filled 2013-03-28: qty 2

## 2013-03-28 MED ORDER — QUETIAPINE FUMARATE 25 MG PO TABS
50.0000 mg | ORAL_TABLET | Freq: Two times a day (BID) | ORAL | Status: DC
  Administered 2013-03-28 – 2013-03-31 (×7): 50 mg via ORAL
  Filled 2013-03-28 (×2): qty 1
  Filled 2013-03-28: qty 2
  Filled 2013-03-28 (×2): qty 1
  Filled 2013-03-28 (×6): qty 2

## 2013-03-28 MED ORDER — QUETIAPINE FUMARATE 100 MG PO TABS
100.0000 mg | ORAL_TABLET | Freq: Every day | ORAL | Status: DC
  Administered 2013-03-28 – 2013-03-29 (×2): 100 mg via ORAL
  Administered 2013-03-30: 50 mg via ORAL
  Filled 2013-03-28 (×7): qty 1

## 2013-03-28 MED ORDER — METFORMIN HCL 500 MG PO TABS
ORAL_TABLET | ORAL | Status: AC
  Administered 2013-03-28: 500 mg via ORAL
  Filled 2013-03-28: qty 1

## 2013-03-28 MED ORDER — QUETIAPINE FUMARATE 100 MG PO TABS
ORAL_TABLET | ORAL | Status: AC
  Filled 2013-03-28: qty 1
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
## 2013-03-28 NOTE — ED Notes (Signed)
Ivan [name], ACT team member from [facility] called to set up tele-psych conference with pt to reassess said pt. EDP notified and pt notified of request

## 2013-03-28 NOTE — ED Provider Notes (Addendum)
Charmaine Downs, NP for psychiatry returned call about patient refusing navane and advises seroquel bid and qhs.  Orders placed.   [name], MD 03/28/13 1636  Mr. Warrick , counselor from behavioral health called and stated he had reassessed patient and feels he still needs hospitalization but no beds available at [facility]. He will attempt to find other placement.   [name], MD 03/28/13 2222

## 2013-03-28 NOTE — ED Notes (Signed)
Patient ambulatory to shower with NT and [employer] officer. Patient provided new scrubs. Room cleaned while patient in shower, full linen changed, new blankets provided.
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
## 2013-03-28 NOTE — ED Notes (Signed)
Tele-psych assessment completed at this time. Pt states he is homeless and will need a place to go if DC'd from the ED. Pt states he has a house however does not feel he can go back there due to family issues. EDP notified.

## 2013-03-28 NOTE — ED Notes (Signed)
Patient given crackers and a diet sprite.

## 2013-03-28 NOTE — ED Notes (Signed)
Pt becoming agitated, states has been here since Thursday , has not been allowed outside at all, wants a cigarette and would like to use the phone. Pt also refusing to take HS medication stating he only takes meds twice a day. Reminded pt he no longer wants to take the Navane and had to change the medicine to help him get better to get back home. Pt hesitantly agreed to take new medication.

## 2013-03-28 NOTE — ED Notes (Signed)
Sitting in room. Awake, no distress. Meal tray given.

## 2013-03-28 NOTE — ED Notes (Signed)
Marya Amsler, covering act team member called, informed of pt's pending status and bed acceptance at behavior health hospital in [place]. EDP updated per request.

## 2013-03-28 NOTE — ED Provider Notes (Signed)
ACT contacted, states patient has been accepted at [facility] but will need to be on the psychotic floor, however it could be several days before they have an opening.   His nurse just informed me patient refuses to take Navane because of eye problems. We will need to know from psychiatry what other antipsychotic patient can take.    Rolland Porter, MD, Abram Sander   Janice Norrie, MD 03/28/13 [phone]

## 2013-03-28 NOTE — ED Notes (Signed)
Patient refusing Navane this morning. States "that's the medicine that messed my eyes up". Not given.

## 2013-03-28 NOTE — ED Notes (Signed)
Ivan [name], act team member notified of EDP's request to have another Antipsychotic medication prescribed. Will contact pysch dr on call and return request.

## 2013-03-29 LAB — GLUCOSE, CAPILLARY: Glucose-Capillary: 119 mg/dL — ABNORMAL HIGH (ref 70–99)

## 2013-03-29 MED ORDER — METFORMIN HCL 500 MG PO TABS
ORAL_TABLET | ORAL | Status: AC
  Administered 2013-03-29: 500 mg via ORAL
  Filled 2013-03-29: qty 1

## 2013-03-29 MED ORDER — POTASSIUM CHLORIDE CRYS ER 20 MEQ PO TBCR
EXTENDED_RELEASE_TABLET | ORAL | Status: AC
  Administered 2013-03-29: 10 meq via ORAL
  Filled 2013-03-29: qty 1

## 2013-03-29 MED ORDER — ACETAMINOPHEN 325 MG PO TABS
ORAL_TABLET | ORAL | Status: AC
Start: 2013-03-29 — End: 2013-03-29
  Administered 2013-03-29: 325 mg via ORAL
  Filled 2013-03-29: qty 1

## 2013-03-29 MED ORDER — METFORMIN HCL 500 MG PO TABS
ORAL_TABLET | ORAL | Status: AC
  Filled 2013-03-29: qty 1

## 2013-03-29 MED ORDER — ASPIRIN 325 MG PO TABS
ORAL_TABLET | ORAL | Status: AC
  Administered 2013-03-29: 325 mg via ORAL
  Filled 2013-03-29: qty 1

## 2013-03-29 NOTE — ED Notes (Signed)
CBG 98, pt unsure of how much he wants to eat at dinner, Dr. Jeanell Sparrow notified, advised to  Hold metformin.

## 2013-03-29 NOTE — ED Notes (Signed)
Pt remains cooperative, [employer] remains with pt at bedside, pt ambulatory to restroom as needed, tolerating well,

## 2013-03-29 NOTE — Progress Notes (Signed)
Writer has attempted to contact several inpatient units including [facility], [facility], [facility], , [facility], [facility].  All adult male units are at capacity excluding [facility].  Paper work was faxed off to [employer] at [phone].
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
## 2013-03-29 NOTE — ED Notes (Signed)
Contacted [facility] who advised that there is still no beds available for pt,

## 2013-03-29 NOTE — ED Notes (Signed)
Pt c/o back pain, requesting pain medication, prn pain medication given as ordered, [employer] remains at bedside, right wrist remains handcuffed to stretcher, cms remains intact.

## 2013-03-30 NOTE — ED Notes (Signed)
Breakfast tray given. °

## 2013-03-30 NOTE — ED Notes (Signed)
Pt eyes closed. resp even/nonlabored. Even/rise fall of chest.

## 2013-03-30 NOTE — ED Notes (Signed)
Sheriff at [place]. nad noted. Pt lying in bed. Eyes closed. Resp even/nonlabored. Even /rise fall of chest.

## 2013-03-30 NOTE — ED Notes (Signed)
Gave pt dinner tray  

## 2013-03-30 NOTE — ED Notes (Signed)
Pt given decaf coffee.

## 2013-03-30 NOTE — ED Notes (Signed)
[facility] called and states that pt has been accepted to [facility] but they are waiting on a male bed.  Also reports [facility] is [facility] the [facility]

## 2013-03-30 NOTE — ED Notes (Signed)
[employer] sitting with patient at this time. Patient is sleep at this time.

## 2013-03-31 ENCOUNTER — Inpatient Hospital Stay (HOSPITAL_COMMUNITY)
Admission: AD | Admit: 2013-03-31 | Discharge: 2013-04-09 | DRG: 885 | Disposition: A | Discharge status: Home / Self Care | Payer: Medicare Other | Source: Intra-hospital | Attending: Psychiatry | Admitting: Psychiatry

## 2013-03-31 DIAGNOSIS — I10 Essential (primary) hypertension: Secondary | ICD-10-CM | POA: Diagnosis present

## 2013-03-31 DIAGNOSIS — E876 Hypokalemia: Secondary | ICD-10-CM | POA: Diagnosis present

## 2013-03-31 DIAGNOSIS — F319 Bipolar disorder, unspecified: Secondary | ICD-10-CM | POA: Diagnosis present

## 2013-03-31 DIAGNOSIS — E119 Type 2 diabetes mellitus without complications: Secondary | ICD-10-CM | POA: Diagnosis present

## 2013-03-31 DIAGNOSIS — F17200 Nicotine dependence, unspecified, uncomplicated: Secondary | ICD-10-CM | POA: Diagnosis present

## 2013-03-31 DIAGNOSIS — N183 Chronic kidney disease, stage 3 unspecified: Secondary | ICD-10-CM | POA: Diagnosis present

## 2013-03-31 DIAGNOSIS — Z7982 Long term (current) use of aspirin: Secondary | ICD-10-CM

## 2013-03-31 DIAGNOSIS — I129 Hypertensive chronic kidney disease with stage 1 through stage 4 chronic kidney disease, or unspecified chronic kidney disease: Secondary | ICD-10-CM | POA: Diagnosis present

## 2013-03-31 DIAGNOSIS — E1065 Type 1 diabetes mellitus with hyperglycemia: Secondary | ICD-10-CM | POA: Diagnosis present

## 2013-03-31 DIAGNOSIS — I498 Other specified cardiac arrhythmias: Secondary | ICD-10-CM | POA: Diagnosis present

## 2013-03-31 DIAGNOSIS — F200 Paranoid schizophrenia: Principal | ICD-10-CM | POA: Diagnosis present

## 2013-03-31 DIAGNOSIS — R600 Localized edema: Secondary | ICD-10-CM | POA: Clinically undetermined

## 2013-03-31 HISTORY — DX: Localized edema: R60.0

## 2013-03-31 HISTORY — DX: Hypokalemia: E87.6

## 2013-03-31 LAB — GLUCOSE, CAPILLARY: Glucose-Capillary: 105 mg/dL — ABNORMAL HIGH (ref 70–99)

## 2013-03-31 MED ORDER — CLONIDINE HCL 0.1 MG PO TABS: 0.1000 mg | ORAL_TABLET | Freq: Once | ORAL | Status: DC

## 2013-03-31 MED ORDER — POTASSIUM CHLORIDE CRYS ER 20 MEQ PO TBCR
20.0000 meq | EXTENDED_RELEASE_TABLET | Freq: Two times a day (BID) | ORAL | Status: AC
  Administered 2013-03-31 – 2013-04-02 (×4): 20 meq via ORAL
  Filled 2013-03-31 (×5): qty 1

## 2013-03-31 MED ORDER — ALUM & MAG HYDROXIDE-SIMETH 200-200-20 MG/5ML PO SUSP: 30.0000 mL | ORAL | Status: DC | PRN

## 2013-03-31 MED ORDER — ACETAMINOPHEN 325 MG PO TABS
650.0000 mg | ORAL_TABLET | Freq: Four times a day (QID) | ORAL | Status: DC | PRN
  Administered 2013-04-01 – 2013-04-09 (×6): 650 mg via ORAL

## 2013-03-31 MED ORDER — MAGNESIUM HYDROXIDE 400 MG/5ML PO SUSP: 30.0000 mL | Freq: Every day | ORAL | Status: DC | PRN

## 2013-03-31 MED ORDER — TRAZODONE HCL 50 MG PO TABS
50.0000 mg | ORAL_TABLET | Freq: Every evening | ORAL | Status: DC | PRN
  Administered 2013-04-01 – 2013-04-08 (×8): 50 mg via ORAL
  Filled 2013-03-31 (×10): qty 1
  Filled 2013-03-31: qty 4
  Filled 2013-03-31 (×3): qty 1
  Filled 2013-03-31: qty 4
  Filled 2013-03-31 (×4): qty 1
  Filled 2013-03-31 (×2): qty 4
  Filled 2013-03-31 (×2): qty 1

## 2013-03-31 NOTE — ED Notes (Signed)
Pt has been accepted by Dr. Louretta Shorten to [facility] [place] # [phone].  edp notified.

## 2013-03-31 NOTE — ED Notes (Signed)
Spoke with Randall Hiss from [facility] assessment - states pt will have a bed at approx 1630 pending discharge from [facility].  Will call back with verification at that time.

## 2013-03-31 NOTE — ED Provider Notes (Signed)
Per nursing staff patient has been accepted at [facility] by Dr Twanna Hy, MD, Alanson Aly, MD 03/31/13 [phone]

## 2013-03-31 NOTE — ED Notes (Signed)
Patient lying on stretcher; equal rise and fall of chest noted.  Sheriff remains with patient.

## 2013-04-01 ENCOUNTER — Encounter (HOSPITAL_COMMUNITY): Payer: Self-pay | Admitting: Psychiatry

## 2013-04-01 DIAGNOSIS — E119 Type 2 diabetes mellitus without complications: Secondary | ICD-10-CM

## 2013-04-01 DIAGNOSIS — I10 Essential (primary) hypertension: Secondary | ICD-10-CM

## 2013-04-01 DIAGNOSIS — E1065 Type 1 diabetes mellitus with hyperglycemia: Secondary | ICD-10-CM | POA: Diagnosis present

## 2013-04-01 DIAGNOSIS — F200 Paranoid schizophrenia: Principal | ICD-10-CM

## 2013-04-01 LAB — GLUCOSE, CAPILLARY
Glucose-Capillary: 106 mg/dL — ABNORMAL HIGH (ref 70–99)
Glucose-Capillary: 109 mg/dL — ABNORMAL HIGH (ref 70–99)
Glucose-Capillary: 104 mg/dL — ABNORMAL HIGH (ref 70–99)

## 2013-04-01 LAB — COMPREHENSIVE METABOLIC PANEL
ALT: 28 U/L (ref 0–53)
Albumin: 3.1 g/dL — ABNORMAL LOW (ref 3.5–5.2)
Alkaline Phosphatase: 100 U/L (ref 39–117)
Calcium: 10.1 mg/dL (ref 8.4–10.5)
Potassium: 3.2 mEq/L — ABNORMAL LOW (ref 3.5–5.1)
Sodium: 137 mEq/L (ref 135–145)
Total Protein: 7.2 g/dL (ref 6.0–8.3)

## 2013-04-01 MED ORDER — NICOTINE POLACRILEX 2 MG MT GUM
2.0000 mg | CHEWING_GUM | OROMUCOSAL | Status: DC | PRN
  Administered 2013-04-01 – 2013-04-09 (×25): 2 mg via ORAL

## 2013-04-01 MED ORDER — HYDRALAZINE HCL 50 MG PO TABS
50.0000 mg | ORAL_TABLET | Freq: Two times a day (BID) | ORAL | Status: DC
  Administered 2013-04-01 – 2013-04-02 (×3): 50 mg via ORAL
  Filled 2013-04-01 (×6): qty 1

## 2013-04-01 MED ORDER — INSULIN ASPART 100 UNIT/ML ~~LOC~~ SOLN
0.0000 [IU] | Freq: Three times a day (TID) | SUBCUTANEOUS | Status: DC
  Administered 2013-04-02 – 2013-04-03 (×4): 1 [IU] via SUBCUTANEOUS
  Administered 2013-04-03: 2 [IU] via SUBCUTANEOUS
  Administered 2013-04-03: 1 [IU] via SUBCUTANEOUS
  Administered 2013-04-04: 2 [IU] via SUBCUTANEOUS
  Administered 2013-04-04 – 2013-04-05 (×3): 1 [IU] via SUBCUTANEOUS
  Administered 2013-04-05 – 2013-04-07 (×4): 2 [IU] via SUBCUTANEOUS
  Administered 2013-04-07: 1 [IU] via SUBCUTANEOUS
  Administered 2013-04-07 – 2013-04-08 (×2): 2 [IU] via SUBCUTANEOUS
  Administered 2013-04-08 – 2013-04-09 (×2): 1 [IU] via SUBCUTANEOUS

## 2013-04-01 NOTE — Tx Team (Signed)
Initial Interdisciplinary Treatment Plan  PATIENT STRENGTHS: (choose at least two) Communication skills Motivation for treatment/growth Religious Affiliation  PATIENT STRESSORS: Health problems   PROBLEM LIST: Problem List/Patient Goals Date to be addressed Date deferred Reason deferred Estimated date of resolution  Paranoia 04/01/13                                                      DISCHARGE CRITERIA:  Adequate post-discharge living arrangements Improved stabilization in mood, thinking, and/or behavior Motivation to continue treatment in a less acute level of care Reduction of life-threatening or endangering symptoms to within safe limits Verbal commitment to aftercare and medication compliance  PRELIMINARY DISCHARGE PLAN: Participate in family therapy Placement in alternative living arrangements  PATIENT/FAMIILY INVOLVEMENT: This treatment plan has been presented to and reviewed with the patient, Ivan [name], and/or family member.  The patient and family have been given the opportunity to ask questions and make suggestions.  Ivan [name] [facility] 04/01/2013, 2:54 AM

## 2013-04-01 NOTE — Progress Notes (Signed)
The focus of this group is to help patients review their daily goal of treatment and discuss progress on daily workbooks. Pt attended the evening group session but responded minimally to discussion prompts from the [place]. When asked for a positive from his day, Pt responded that he was "feeling full" from the meals here. Pt reported he was otherwise "doing well" and that his only additional needs from Nursing Staff this evening were towels, which were provided to him following group. Pt's affect was neutral and he was polite and appropriate during group.
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
## 2013-04-01 NOTE — Consult Note (Signed)
[facility] Medical Consultation  MALIKK SIMRELL [id number] DOB: 1961/02/24 DOA: 03/31/2013 PCP: Cleda Mccreedy, MD   Requesting physician: Dr Madaline Savage Date of consultation: 04-01-2013 Reason for consultation: Management of Diabetes and HTN  Impression/Recommendations Principal Problem:   Paranoid schizophrenia, chronic condition Active Problems:   Diabetes   Essential hypertension, benign    1. Diabetes: I would hold metformin because of Cr at 1.5. Cr is contraindicated in male patient with Cr above 1.5. Continue to monitor CBG. CBG well controlled at 104 range. SSI as needed. Could consider very low dose glipizide at discharge if Blood sugar increased. Notice one of glipizide adverse effect is hypoglycemia. Will follow patient with you and continue to give recommendations.  2. HTN: resume hydralazine.  3. Hypokalemia; K-cl ordered time 4 doses. Repeat labs in am.  4. Tachycardia:Check EKG 5. CKD stage 3: creatine at 1.5 lower than on 03-26-2013. Monitor.  I will followup again tomorrow. Please contact me if I can be of assistance in the meanwhile. Thank you for this consultation.    HPI:  52 year old with PMH significant for HTN,  Diabetes, CKD stage 3, Cr baseline 1.4 who was admitted at [facility] for delusion, we were consulted to help with management of Diabetes and HTN. Patient denies chest pain, dyspnea, diarrhea, constipation. He is eating well.  Review of Systems:  Negative except as per HPI.   Past Medical History  Diagnosis Date  . Diabetes mellitus   . Hypertension   . Bipolar 1 disorder   . Schizoaffective disorder    Past Surgical History  Procedure Laterality Date  . Breast surgery     Social History:  reports that he has been smoking Cigarettes and Cigars.  He has been smoking about 0.50 packs per day. He has never used smokeless tobacco. He reports that he does not drink alcohol or use illicit drugs.  No Known Allergies family history.: History of  diabetes, and HTN.   Prior to Admission medications   Medication Sig Start Date End Date Taking? Authorizing Provider  hydrALAZINE (APRESOLINE) 50 MG tablet Take 50 mg by mouth 2 (two) times daily.   Yes Historical Provider, MD  metFORMIN (GLUCOPHAGE) 500 MG tablet Take 500 mg by mouth 2 (two) times daily with a meal.   Yes Historical Provider, MD  potassium chloride (K-DUR) 10 MEQ tablet Take 10 mEq by mouth 2 (two) times daily.   Yes Historical Provider, MD  Aspirin-Caffeine (BAYER BACK & BODY PAIN EX ST) 500-32.5 MG TABS Take 1 tablet by mouth daily as needed (for back pain).    Historical Provider, MD   Physical Exam: Blood pressure 140/93, pulse 110, temperature 97.3 F (36.3 C), temperature source Oral, resp. rate 20, height 6' (1.829 m). Filed Vitals:   04/01/13 0731  BP: 140/93  Pulse: 110  Temp:   Resp:      General: no distress.   Eyes: PERLA, EOM intact  ENT: No tonsillar enlargement, no erythema.   Neck: supple no thyromegaly  Cardiovascular: S 1, S 2 RRR  Respiratory: CTA  Abdomen: obese, NT.   Skin: venous stasis changes LE  Musculoskeletal: trace edema.   Neurologic: non focal.   Labs on Admission:  Basic Metabolic Panel:  Recent Labs Lab 03/26/13 1231 04/01/13 0610  NA 141 137  K 2.9* 3.2*  CL 104 101  CO2 25 27  GLUCOSE 118* 119*  BUN 15 19  CREATININE 1.69* 1.50*  CALCIUM 9.2 10.1   Liver Function Tests:  Recent Labs Lab 03/26/13 1231 04/01/13 0610  AST 90* 26  ALT 44 28  ALKPHOS 112 100  BILITOT 0.6 0.2*  PROT 7.4 7.2  ALBUMIN 3.3* 3.1*   No results found for this basename: LIPASE, AMYLASE,  in the last 168 hours No results found for this basename: AMMONIA,  in the last 168 hours CBC:  Recent Labs Lab 03/26/13 1231  WBC 11.5*  NEUTROABS 8.2*  HGB 12.0*  HCT 35.5*  MCV 87.2  PLT 306   Cardiac Enzymes: No results found for this basename: CKTOTAL, CKMB, CKMBINDEX, TROPONINI,  in the last 168 hours BNP: No  components found with this basename: POCBNP,  CBG:  Recent Labs Lab 03/29/13 2158 03/31/13 1741 03/31/13 2343 04/01/13 0601 04/01/13 1159  GLUCAP 111* 105* 106* 109* 104*    Radiological Exams on Admission: No results found.    Time spent: 55 minutes  Joycelynn Fritsche [facility] Pager [phone]  If 7PM-7AM, please contact night-coverage [email or web address] Password [facility] 04/01/2013, 4:23 PM

## 2013-04-01 NOTE — Progress Notes (Signed)
Recreation Therapy Notes  Date: 08.13.2014 Time: 9:30am Location: [address] Hall Dayroom  Group Topic: Leisure Education  Goal Area(s) Addresses:  Patient will verbalize activity of interest by end of group session. Patient will verbalize the ability to use positive leisure/recreation as a coping mechanism.  Behavioral Response: Engaged, Appropriate  Intervention: Adapted Game  Activity: Letters of Leisure. Patients were asked to select a card with a letter of the alphabet from LRT. Patients were asked to state a leisure/recreation activity of choice to correspond with the letter chosen. After each patient had chosen a card and identified an activity group members were asked to identify letters not selected individually.   Education:  Leisure Education, Radiographer, therapeutic, Discharge Planning  Education Outcome: Acknowledges understanding  Clinical Observations/Feedback:  Patient actively engaged in group session. Patient spontaneously contributed to opening discussion, identifying times appropriate times to participate in leisure. Additionally patient was able to identify "burn out" as a result of not having a healthy leisure lifestyle. Patient went on to explain that burn out can lead to inpatient admissions at behavioral health facilities. Patient participated in activity, selecting a card and identifying an appropriate activity. Additionally patient spontaneously contributed activities to group list. Patient contributed to wrap up discussion, making a connection between using leisure/recreation and having companionship and building relationships. Patient additionally identified places where you can participate in leisure/recreation, as well as non-tangible items needed to participate in leisure/recreation.   Laureen Ochs Zeva Leber, LRT/CTRS  Alexias Margerum L 04/01/2013 1:24 PM

## 2013-04-01 NOTE — Progress Notes (Signed)
Patient ID: Ivan [name], male   DOB: 12-Jul-1961, 52 y.o.   MRN: [id number] EKG completed, attached copy to dummy chart.
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
## 2013-04-01 NOTE — BHH Suicide Risk Assessment (Signed)
Suicide Risk Assessment  Admission Assessment     Nursing information obtained from:  Patient Demographic factors:  Male Current Mental Status:  NA Loss Factors:  Loss of significant relationship Historical Factors:    Risk Reduction Factors:  Responsible for children under 50 years of age  CLINICAL FACTORS:   Schizophrenia:   Paranoid or undifferentiated type Currently Psychotic Unstable or Poor Therapeutic Relationship  COGNITIVE FEATURES THAT CONTRIBUTE TO RISK:  Closed-mindedness Polarized thinking    SUICIDE RISK:   Minimal: No identifiable suicidal ideation.  Patients presenting with no risk factors but with morbid ruminations; may be classified as minimal risk based on the severity of the depressive symptoms  PLAN OF CARE:1. Admit for crisis management and stabilization. 2. Medication management to reduce current symptoms to base line and improve the     patient's overall level of functioning 3. Treat health problems as indicated. 4. Develop treatment plan to decrease risk of relapse upon discharge and the need for     readmission. 5. Psycho-social education regarding relapse prevention and self care. 6. Health care follow up as needed for medical problems. 7. Restart home medications where appropriate.   I certify that inpatient services furnished can reasonably be expected to improve the patient's condition.  Aubrey Blackard,MD 04/01/2013, 11:35 AM

## 2013-04-01 NOTE — Progress Notes (Signed)
Patient ID: Ivan [name], male   DOB: 05-28-1961, 52 y.o.   MRN: [id number] D: Pt. Visible on unit, in dayroom, reports "doing all right." Pt. Has abrasion to right posterior arm, and reports one right leg also. When writer asked if he obtained abrasion from a fall, pt. Reports "it was a little bit more than that", but would not elaborate. He does however allude to some family stressors. A: Writer introduced self to client and reviews med administration times. Writer also discuss earlier order to have EKG done. Pt. Reports he understands procedure. Writer will administer before bedtime.  Pt. Will monitor q41min for safety. Pt. Encouraged to attend group. R: Pt. Is safe on the unit. Pt. Has agreed to have EKG done. Pt. Attended group.
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
## 2013-04-01 NOTE — BHH Group Notes (Signed)
[facility] [employer] Group Therapy  04/01/2013 , 1:30 PM    Type of Therapy:  Mental Health Association Presentation  Participation Level:  Ivan [name] attended the first 20 minutes of group.  He was quiet but attentive.  He got up abruptly and left, never to return.    Summary of Progress/Problems:  [name] from [place] came to present his recovery story and play the guitar.    [name] 04/01/2013 , 1:30 PM

## 2013-04-01 NOTE — H&P (Signed)
Psychiatric Admission Assessment Adult  Patient Identification:  Ivan [name] Date of Evaluation:  04/01/2013 Chief Complaint:  "my family are still spying on me." History of Present Illness: Mr.  Aragona is a 52 year old man, divorced and lives alone. He reports Long history of mental illness dating back to 2001. He was diagnosed with Paranoid schizophrenia. He reports that he was brought to the [facility] by his family  wearing nothing but a towel with his clothes in a bag. He stated that he stopped taking his medications because "they make my eyes red". He reports that he is not feeling well without his medications and need to get on some other medications that can help him He presents with worsening paranoid delusions, irritable mood, disorganized thought process, poor insight and judgment. He has a fixed delusion that he is been spying on by his family and as a result in constant faight with them. He denies alcohol and illicit drugs use. He also denies suicidal ideation, intent and plan. Elements:  Location:  [facility] inpatient . Associated Signs/Synptoms: Depression Symptoms:  psychomotor retardation, difficulty concentrating, (Hypo) Manic Symptoms:  Delusions, Impulsivity, Irritable Mood, Anxiety Symptoms:  Excessive Worry, Psychotic Symptoms:  Delusions, PTSD Symptoms: NA  Psychiatric Specialty Exam: Physical Exam  Psychiatric: His speech is normal. His affect is angry. He is actively hallucinating. Thought content is paranoid and delusional. Cognition and memory are normal. He expresses impulsivity and inappropriate judgment.    Review of Systems  Constitutional: Negative.   HENT: Negative.   Eyes: Negative.   Respiratory: Negative.   Cardiovascular: Negative.   Gastrointestinal: Negative.   Genitourinary: Negative.   Musculoskeletal: Negative.   Skin: Negative.   Neurological: Negative.   Endo/Heme/Allergies: Negative.   Psychiatric/Behavioral: Positive for hallucinations. The  patient is nervous/anxious.     Blood pressure 140/93, pulse 110, temperature 97.3 F (36.3 C), temperature source Oral, resp. rate 20, height 6' (1.829 m).There is no weight on file to calculate BMI.  General Appearance: Disheveled  Eye Contact::  Minimal  Speech:  Clear and Coherent  Volume:  Decreased  Mood:  Dysphoric  Affect:  Blunt  Thought Process:  Disorganized  Orientation:  Full (Time, Place, and Person)  Thought Content:  Delusions and Paranoid Ideation  Suicidal Thoughts:  No  Homicidal Thoughts:  No  Memory:  Immediate;   Fair Recent;   Fair Remote;   Fair  Judgement:  Poor  Insight:  Shallow  Psychomotor Activity:  Decreased  Concentration:  Fair  Recall:  Fair  Akathisia:  No  Handed:  Right  AIMS (if indicated):     Assets:  Desire for Improvement  Sleep:  Number of Hours: 3.75    Past Psychiatric History: Diagnosis:  Hospitalizations:  Outpatient Care:  Substance Abuse Care:  Self-Mutilation:  Suicidal Attempts:  Violent Behaviors:   Past Medical History:   Past Medical History  Diagnosis Date  . Diabetes mellitus   . Hypertension   . Bipolar 1 disorder   . Schizoaffective disorder     Allergies:  No Known Allergies PTA Medications: Prescriptions prior to admission  Medication Sig Dispense Refill  . hydrALAZINE (APRESOLINE) 50 MG tablet Take 50 mg by mouth 2 (two) times daily.      . metFORMIN (GLUCOPHAGE) 500 MG tablet Take 500 mg by mouth 2 (two) times daily with a meal.      . potassium chloride (K-DUR) 10 MEQ tablet Take 10 mEq by mouth 2 (two) times daily.      Marland Kitchen  Aspirin-Caffeine (BAYER BACK & BODY PAIN EX ST) 500-32.5 MG TABS Take 1 tablet by mouth daily as needed (for back pain).        Previous Psychotropic Medications:  Medication/Dose: Navane                 Substance Abuse History in the last 12 months:  no  Consequences of Substance Abuse: NA  Social History:  reports that he has been smoking Cigarettes and  Cigars.  He has been smoking about 0.50 packs per day. He has never used smokeless tobacco. He reports that he does not drink alcohol or use illicit drugs. Additional Social History: Pain Medications:  (Denied)                    Current Place of Residence:   Place of Birth:   Family Members: Marital Status:  Divorced Children:  Sons:  Daughters: Relationships: Education:  unknown Educational Problems/Performance: Religious Beliefs/Practices: History of Abuse (Emotional/Phsycial/Sexual) Ship broker History:  None. Legal History: Hobbies/Interests:  Family History:  History reviewed. No pertinent family history.  Results for orders placed during the hospital encounter of 03/31/13 (from the past 72 hour(s))  GLUCOSE, CAPILLARY     Status: Abnormal   Collection Time    03/31/13 11:43 PM      Result Value Range   Glucose-Capillary 106 (*) 70 - 99 mg/dL  GLUCOSE, CAPILLARY     Status: Abnormal   Collection Time    04/01/13  6:01 AM      Result Value Range   Glucose-Capillary 109 (*) 70 - 99 mg/dL  COMPREHENSIVE METABOLIC PANEL     Status: Abnormal   Collection Time    04/01/13  6:10 AM      Result Value Range   Sodium 137  135 - 145 mEq/L   Potassium 3.2 (*) 3.5 - 5.1 mEq/L   Chloride 101  96 - 112 mEq/L   CO2 27  19 - 32 mEq/L   Glucose, Bld 119 (*) 70 - 99 mg/dL   BUN 19  6 - 23 mg/dL   Creatinine, Ser 1.50 (*) 0.50 - 1.35 mg/dL   Calcium 10.1  8.4 - 10.5 mg/dL   Total Protein 7.2  6.0 - 8.3 g/dL   Albumin 3.1 (*) 3.5 - 5.2 g/dL   AST 26  0 - 37 U/L   ALT 28  0 - 53 U/L   Alkaline Phosphatase 100  39 - 117 U/L   Total Bilirubin 0.2 (*) 0.3 - 1.2 mg/dL   GFR calc non Af Amer 53 (*) >90 mL/min   GFR calc Af Amer 61 (*) >90 mL/min   Comment:            The eGFR has been calculated     using the CKD EPI equation.     This calculation has not been     validated in all clinical     situations.     eGFR's persistently     <90 mL/min  signify     possible Chronic Kidney Disease.     Performed at [facility]   Psychological Evaluations:  Assessment:   AXIS I:  Chronic Paranoid Schizophrenia AXIS II:  Deferred AXIS III:   Past Medical History  Diagnosis Date  . Diabetes mellitus   . Hypertension    AXIS IV:  other psychosocial or environmental problems and problems related to social environment AXIS V:  21-30 behavior considerably influenced  by delusions or hallucinations OR serious impairment in judgment, communication OR inability to function in almost all areas  Treatment Plan/Recommendations: 1. Admit for crisis management and stabilization. 2. Medication management to reduce current symptoms to base line and improve the     patient's overall level of functioning 3. Treat health problems as indicated. 4. Develop treatment plan to decrease risk of relapse upon discharge and the need for     readmission. 5. Psycho-social education regarding relapse prevention and self care. 6. Health care follow up as needed for medical problems. 7. Restart home medications where appropriate.   Treatment Plan Summary: Daily contact with patient to assess and evaluate symptoms and progress in treatment Medication management Current Medications:  Current Facility-Administered Medications  Medication Dose Route Frequency Provider Last Rate Last Dose  . acetaminophen (TYLENOL) tablet 650 mg  650 mg Oral Q6H PRN Laverle Hobby, PA-C      . alum & mag hydroxide-simeth (MAALOX/MYLANTA) 200-200-20 MG/5ML suspension 30 mL  30 mL Oral Q4H PRN Laverle Hobby, PA-C      . magnesium hydroxide (MILK OF MAGNESIA) suspension 30 mL  30 mL Oral Daily PRN Laverle Hobby, PA-C      . nicotine polacrilex (NICORETTE) gum 2 mg  2 mg Oral PRN Caylon Saine      . potassium chloride SA (K-DUR,KLOR-CON) CR tablet 20 mEq  20 mEq Oral BID Laverle Hobby, PA-C   20 mEq at 04/01/13 0751  . traZODone (DESYREL) tablet 50 mg  50 mg  Oral QHS,MR X 1 Laverle Hobby, PA-C        Observation Level/Precautions:  routine  Laboratory:  routine  Psychotherapy:    Medications:    Consultations:    Discharge Concerns:    Estimated LOS: 0000000  Other:     I certify that inpatient services furnished can reasonably be expected to improve the patient's condition.   Darleene Cleaver, Gentry Fitz 8/13/201411:36 AM

## 2013-04-01 NOTE — Progress Notes (Signed)
D:  Per pt self inventory pt reports sleeping fair, appetite improving, energy level normal, ability to pay attention good, rates depression at 8 out of 10 and hopelessness at a 7 out of 10, denies SI/HI/AVH, denies pain, requesting nicorette gum for nicotine cravings, +paranoid delusions of family that they "have taken enough money from me", needs order for regular home medications.  A:  Order obtained for prn nicorette gum, MD/mid-level notified that pt needs home meds ordered, Emotional support provided, Encouraged pt to continue with treatment plan and attend all group activities, q15 min checks maintained for safety.  R:  Pt is calm, cooperative, attending groups, assertive in requests from nursing staff and MD, pt states that nicotine gum is helping the cravings.

## 2013-04-02 ENCOUNTER — Encounter (HOSPITAL_COMMUNITY): Payer: Self-pay | Admitting: Internal Medicine

## 2013-04-02 DIAGNOSIS — E876 Hypokalemia: Secondary | ICD-10-CM | POA: Diagnosis present

## 2013-04-02 HISTORY — DX: Hypokalemia: E87.6

## 2013-04-02 LAB — BASIC METABOLIC PANEL
BUN: 19 mg/dL (ref 6–23)
BUN: 18 mg/dL (ref 6–23)
Chloride: 104 mEq/L (ref 96–112)
GFR calc Af Amer: 61 mL/min — ABNORMAL LOW (ref >90)
GFR calc non Af Amer: 53 mL/min — ABNORMAL LOW (ref >90)
Glucose, Bld: 128 mg/dL — ABNORMAL HIGH (ref 70–99)
Potassium: 3.4 mEq/L — ABNORMAL LOW (ref 3.5–5.1)
Potassium: 3.5 mEq/L (ref 3.5–5.1)
Sodium: 140 mEq/L (ref 135–145)

## 2013-04-02 LAB — GLUCOSE, CAPILLARY
Glucose-Capillary: 139 mg/dL — ABNORMAL HIGH (ref 70–99)
Glucose-Capillary: 130 mg/dL — ABNORMAL HIGH (ref 70–99)
Glucose-Capillary: 125 mg/dL — ABNORMAL HIGH (ref 70–99)
Glucose-Capillary: 159 mg/dL — ABNORMAL HIGH (ref 70–99)

## 2013-04-02 LAB — MAGNESIUM: Magnesium: 1.5 mg/dL (ref 1.5–2.5)

## 2013-04-02 MED ORDER — HYDRALAZINE HCL 50 MG PO TABS
50.0000 mg | ORAL_TABLET | Freq: Three times a day (TID) | ORAL | Status: DC
  Administered 2013-04-02 – 2013-04-04 (×6): 50 mg via ORAL
  Filled 2013-04-02 (×11): qty 1

## 2013-04-02 MED ORDER — POTASSIUM CHLORIDE CRYS ER 20 MEQ PO TBCR
20.0000 meq | EXTENDED_RELEASE_TABLET | Freq: Every day | ORAL | Status: DC
  Administered 2013-04-03 – 2013-04-06 (×4): 20 meq via ORAL
  Filled 2013-04-02 (×6): qty 1

## 2013-04-02 MED ORDER — POTASSIUM CHLORIDE CRYS ER 20 MEQ PO TBCR
40.0000 meq | EXTENDED_RELEASE_TABLET | Freq: Once | ORAL | Status: AC
  Administered 2013-04-02: 40 meq via ORAL
  Filled 2013-04-02: qty 2
  Filled 2013-04-02: qty 1

## 2013-04-02 MED ORDER — POTASSIUM CHLORIDE CRYS ER 20 MEQ PO TBCR: 40.0000 meq | EXTENDED_RELEASE_TABLET | ORAL | Status: DC

## 2013-04-02 MED ORDER — PALIPERIDONE ER 6 MG PO TB24
9.0000 mg | ORAL_TABLET | Freq: Every day | ORAL | Status: DC
  Administered 2013-04-02 – 2013-04-09 (×8): 9 mg via ORAL
  Filled 2013-04-02 (×11): qty 1

## 2013-04-02 MED ORDER — BENZTROPINE MESYLATE 1 MG PO TABS
1.0000 mg | ORAL_TABLET | Freq: Every day | ORAL | Status: DC
  Administered 2013-04-02 – 2013-04-09 (×8): 1 mg via ORAL
  Filled 2013-04-02 (×11): qty 1

## 2013-04-02 NOTE — Progress Notes (Signed)
Patient ID: Ivan [name], male   DOB: 08-25-1960, 52 y.o.   MRN: [id number] D: Patient presents with flat, blunted affect with poor eye contact.  Patient's speech is tangental and disorganized.  He denies any SI/HI/AVH.  He rates his depression and hopelessness as a 9.  Patient states that he is upset with his family and he has homes in many different places.  He is not sure where he will after discharge.  There may be a plan is place for an assisted living facility because it not certain whether family will want to him back in the home. A:  Continue to monitor medication management and MD orders.  Safety checks completed every 15 minutes per protocol.  R: Patient's behavior has been appropriate on the milieu.

## 2013-04-02 NOTE — [facility] Group Notes (Signed)
[address] Group Notes:  (Counselor/Nursing/MHT/Case Management/Adjunct)  04/02/2013 1:15PM  Type of Therapy:  Group Therapy  Participation Level:  Active  Participation Quality:  Appropriate  Affect:  Flat  Cognitive:  Oriented  Insight:  Improving  Engagement in Group:  Limited  Engagement in Therapy:  Limited  Modes of Intervention:  Discussion, Exploration and Socialization  Summary of Progress/Problems: The topic for group was balance in life.  Pt participated in the discussion about when their life was in balance and out of balance and how this feels.  Pt discussed ways to get back in balance and short term goals they can work on to get where they want to be. Ivan [name] came in late and stood by the door for the entire group.  He contributed to the discussion, but his comments were very general and vague.  They were not off topic, but we could have been talking about anything, and the comments would have been relevant.  For instance, "We just have to take it one day at a time and everything will work out."   Motorola B 04/02/2013 3:11 PM

## 2013-04-02 NOTE — Clinical Social Work Note (Signed)
Spoke with sister about plan.  She believes he can be ordered to abide in a [facility] or ALF, and is planning on pursuing that from her end.  I offered her referral to ACT team, outpt commitment, and told her we would need to figure out a living situation as a back up plan if ALF is not an option, or if they do want him to return to the family house.

## 2013-04-02 NOTE — Progress Notes (Signed)
[facility] PROGRESS NOTE  Ivan [name] [id number] DOB: May 06, 1961 DOA: 03/31/2013 PCP: Cleda Mccreedy, MD  Assessment/Plan: #1 Well controlled diabetes mellitus Hemoglobin A1c 7.1. CBGs have ranged from 107-139. Patient with chronic kidney disease and creatinine approximately at 1.5. Would recommend discontinuing metformin on discharge and start patient on low-dose glipizide. Continue sliding scale insulin for now.  #2 hypertension Systolic blood pressures elevated. Patient's hydralazine home dose has been resumed at 50 mg twice daily. Increase hydralazine to 50 mg 3 times daily. Follow and up titrate as needed.  #4 hypokalemia Patient with a history of hypokalemia on chronic potassium supplementation. Replete potassium. Place on home regimen of 20 mEq of KCl daily starting tomorrow.  #5 tachycardia EKG with sinus tachycardia resolved.  #6 chronic kidney disease stage III. Stable. Follow.  #7 chronic paranoid schizophrenia Per primary team.  Code Status: Full Family Communication: updated patient no family at bedside. Disposition Plan: Per primary team   Consultants:  None  Procedures:  None  Antibiotics:  None  HPI/Subjective: Patient with no complaints.  Objective: Filed Vitals:   04/02/13 1705  BP: 155/89  Pulse: 99  Temp: 98.2 F (36.8 C)  Resp: 16   No intake or output data in the 24 hours ending 04/02/13 1941 There were no vitals filed for this visit.  Exam:   General:   NAD  Cardiovascular: RRR  Respiratory: CTAB  Abdomen: Soft/NT/OBESE/+BS  Musculoskeletal: 5/5 BUE strength, 5/5 BLE strength   Data Reviewed: Basic Metabolic Panel:  Recent Labs Lab 04/01/13 0610 04/02/13 0620  NA 137 138  K 3.2* 3.4*  CL 101 104  CO2 27 27  GLUCOSE 119* 128*  BUN 19 19  CREATININE 1.50* 1.49*  CALCIUM 10.1 9.5   Liver Function Tests:  Recent Labs Lab 04/01/13 0610  AST 26  ALT 28  ALKPHOS 100  BILITOT 0.2*  PROT 7.2   ALBUMIN 3.1*   No results found for this basename: LIPASE, AMYLASE,  in the last 168 hours No results found for this basename: AMMONIA,  in the last 168 hours CBC: No results found for this basename: WBC, NEUTROABS, HGB, HCT, MCV, PLT,  in the last 168 hours Cardiac Enzymes: No results found for this basename: CKTOTAL, CKMB, CKMBINDEX, TROPONINI,  in the last 168 hours BNP (last 3 results) No results found for this basename: PROBNP,  in the last 8760 hours CBG:  Recent Labs Lab 04/01/13 1705 04/01/13 2049 04/02/13 0639 04/02/13 1140 04/02/13 1711  GLUCAP 113* 107* 139* 130* 125*    No results found for this or any previous visit (from the past 240 hour(s)).   Studies: No results found.  Scheduled Meds: . benztropine  1 mg Oral Daily  . hydrALAZINE  50 mg Oral BID  . insulin aspart  0-9 Units Subcutaneous TID WC  . paliperidone  9 mg Oral Daily  . [START ON 04/03/2013] potassium chloride  20 mEq Oral Daily  . potassium chloride  40 mEq Oral Once  . traZODone  50 mg Oral QHS,MR X 1   Continuous Infusions:   Principal Problem:   Paranoid schizophrenia, chronic condition Active Problems:   Diabetes   Essential hypertension, benign   Hypokalemia    Time spent: 30 mins    [facility] Pager [phone]. If 7PM-7AM, please contact night-coverage at [email or web address], password [facility] 04/02/2013, 7:41 PM  LOS: 2 days

## 2013-04-02 NOTE — Progress Notes (Signed)
[facility] MD Progress Note  04/02/2013 2:39 PM Ivan [name]  MRN:  [id number] Subjective:   Patient states "I have had family problems for years. And relationship problems. I have pressure on me from my family. They always make me take the fall for anything that goes wrong. That's really all I should say."   Objective:  The patient appears very guarded during interactions with very poor eye contact.  Diagnosis:   Axis I: Chronic Paranoid Schizophrenia Axis II: Deferred Axis III:  Past Medical History  Diagnosis Date  . Diabetes mellitus   . Hypertension   . Bipolar 1 disorder   . Schizoaffective disorder    Axis IV: economic problems, housing problems, occupational problems, problems with access to health care services and problems with primary support group Axis V: 41-50 serious symptoms  ADL's:  Intact  Sleep: Good  Appetite:  Good  Suicidal Ideation:  Denies Homicidal Ideation:  Denies AEB (as evidenced by):  Psychiatric Specialty Exam: Review of Systems  Constitutional: Negative.   HENT: Negative.   Eyes: Negative.   Respiratory: Negative.   Cardiovascular: Negative.   Gastrointestinal: Negative.   Genitourinary: Negative.   Musculoskeletal: Negative.   Skin: Negative.   Neurological: Negative.   Endo/Heme/Allergies: Negative.   Psychiatric/Behavioral: Negative for depression, suicidal ideas, hallucinations, memory loss and substance abuse. The patient is nervous/anxious and has insomnia.     Blood pressure 168/100, pulse 79, temperature 98.4 F (36.9 C), temperature source Oral, resp. rate 18, height 6' (1.829 m).There is no weight on file to calculate BMI.  General Appearance: Disheveled  Eye Contact::  Poor  Speech:  Garbled  Volume:  Normal  Mood:  Anxious  Affect:  Flat  Thought Process:  Disorganized  Orientation:  Full (Time, Place, and Person)  Thought Content:  Paranoid Ideation and Rumination  Suicidal Thoughts:  No  Homicidal Thoughts:  No   Memory:  Immediate;   Fair Recent;   Fair Remote;   Fair  Judgement:  Poor  Insight:  Lacking  Psychomotor Activity:  Restlessness  Concentration:  Fair  Recall:  Poor  Akathisia:  No  Handed:  Right  AIMS (if indicated):     Assets:  Communication Skills Desire for Improvement Resilience  Sleep:  Number of Hours: 4   Current Medications: Current Facility-Administered Medications  Medication Dose Route Frequency Provider Last Rate Last Dose  . acetaminophen (TYLENOL) tablet 650 mg  650 mg Oral Q6H PRN Laverle Hobby, PA-C   650 mg at 04/01/13 1710  . alum & mag hydroxide-simeth (MAALOX/MYLANTA) 200-200-20 MG/5ML suspension 30 mL  30 mL Oral Q4H PRN Laverle Hobby, PA-C      . benztropine (COGENTIN) tablet 1 mg  1 mg Oral Daily Mojeed Akintayo   1 mg at 04/02/13 1200  . hydrALAZINE (APRESOLINE) tablet 50 mg  50 mg Oral BID Belkys A Regalado, MD   50 mg at 04/02/13 0736  . insulin aspart (novoLOG) injection 0-9 Units  0-9 Units Subcutaneous TID WC Belkys A Regalado, MD   1 Units at 04/02/13 1200  . magnesium hydroxide (MILK OF MAGNESIA) suspension 30 mL  30 mL Oral Daily PRN Laverle Hobby, PA-C      . nicotine polacrilex (NICORETTE) gum 2 mg  2 mg Oral PRN Mojeed Akintayo   2 mg at 04/02/13 1257  . paliperidone (INVEGA) 24 hr tablet 9 mg  9 mg Oral Daily Mojeed Akintayo   9 mg at 04/02/13 1200  . traZODone (  DESYREL) tablet 50 mg  50 mg Oral QHS,MR X 1 Laverle Hobby, PA-C   50 mg at 04/01/13 2125    Lab Results:  Results for orders placed during the hospital encounter of 03/31/13 (from the past 48 hour(s))  GLUCOSE, CAPILLARY     Status: Abnormal   Collection Time    03/31/13 11:43 PM      Result Value Range   Glucose-Capillary 106 (*) 70 - 99 mg/dL  GLUCOSE, CAPILLARY     Status: Abnormal   Collection Time    04/01/13  6:01 AM      Result Value Range   Glucose-Capillary 109 (*) 70 - 99 mg/dL  COMPREHENSIVE METABOLIC PANEL     Status: Abnormal   Collection Time     04/01/13  6:10 AM      Result Value Range   Sodium 137  135 - 145 mEq/L   Potassium 3.2 (*) 3.5 - 5.1 mEq/L   Chloride 101  96 - 112 mEq/L   CO2 27  19 - 32 mEq/L   Glucose, Bld 119 (*) 70 - 99 mg/dL   BUN 19  6 - 23 mg/dL   Creatinine, Ser 1.50 (*) 0.50 - 1.35 mg/dL   Calcium 10.1  8.4 - 10.5 mg/dL   Total Protein 7.2  6.0 - 8.3 g/dL   Albumin 3.1 (*) 3.5 - 5.2 g/dL   AST 26  0 - 37 U/L   ALT 28  0 - 53 U/L   Alkaline Phosphatase 100  39 - 117 U/L   Total Bilirubin 0.2 (*) 0.3 - 1.2 mg/dL   GFR calc non Af Amer 53 (*) >90 mL/min   GFR calc Af Amer 61 (*) >90 mL/min   Comment:            The eGFR has been calculated     using the CKD EPI equation.     This calculation has not been     validated in all clinical     situations.     eGFR's persistently     <90 mL/min signify     possible Chronic Kidney Disease.     Performed at [facility]     Status: Abnormal   Collection Time    04/01/13  6:10 AM      Result Value Range   Hemoglobin A1C 7.1 (*) <5.7 %   Comment: (NOTE)                                                                               According to the ADA Clinical Practice Recommendations for 2011, when     HbA1c is used as a screening test:      >=6.5%   Diagnostic of Diabetes Mellitus               (if abnormal result is confirmed)     5.7-6.4%   Increased risk of developing Diabetes Mellitus     References:Diagnosis and Classification of Diabetes Mellitus,Diabetes     [id number] 1):S62-S69 and Standards of Medical Care in             Diabetes - 2011,Diabetes [id number] (  Suppl 1):S11-S61.   Mean Plasma Glucose 157 (*) <117 mg/dL   Comment: Performed at [place], [facility]     Status: Abnormal   Collection Time    04/01/13 11:59 AM      Result Value Range   Glucose-Capillary 104 (*) 70 - 99 mg/dL  GLUCOSE, CAPILLARY     Status: Abnormal   Collection Time    04/01/13  5:05 PM      Result  Value Range   Glucose-Capillary 113 (*) 70 - 99 mg/dL  GLUCOSE, CAPILLARY     Status: Abnormal   Collection Time    04/01/13  8:49 PM      Result Value Range   Glucose-Capillary 107 (*) 70 - 99 mg/dL   Comment 1 Notify RN    BASIC METABOLIC PANEL     Status: Abnormal   Collection Time    04/02/13  6:20 AM      Result Value Range   Sodium 138  135 - 145 mEq/L   Potassium 3.4 (*) 3.5 - 5.1 mEq/L   Chloride 104  96 - 112 mEq/L   CO2 27  19 - 32 mEq/L   Glucose, Bld 128 (*) 70 - 99 mg/dL   BUN 19  6 - 23 mg/dL   Creatinine, Ser 1.49 (*) 0.50 - 1.35 mg/dL   Calcium 9.5  8.4 - 10.5 mg/dL   GFR calc non Af Amer 53 (*) >90 mL/min   GFR calc Af Amer 61 (*) >90 mL/min   Comment: (NOTE)     The eGFR has been calculated using the CKD EPI equation.     This calculation has not been validated in all clinical situations.     eGFR's persistently <90 mL/min signify possible Chronic Kidney     Disease.     Performed at [facility]  GLUCOSE, CAPILLARY     Status: Abnormal   Collection Time    04/02/13  6:39 AM      Result Value Range   Glucose-Capillary 139 (*) 70 - 99 mg/dL  GLUCOSE, CAPILLARY     Status: Abnormal   Collection Time    04/02/13 11:40 AM      Result Value Range   Glucose-Capillary 130 (*) 70 - 99 mg/dL    Physical Findings: AIMS: Facial and Oral Movements Muscles of Facial Expression: None, normal Lips and Perioral Area: None, normal Jaw: None, normal Tongue: None, normal,Extremity Movements Upper (arms, wrists, hands, fingers): None, normal Lower (legs, knees, ankles, toes): None, normal, Trunk Movements Neck, shoulders, hips: None, normal, Overall Severity Severity of abnormal movements (highest score from questions above): None, normal Incapacitation due to abnormal movements: None, normal Patient's awareness of abnormal movements (rate only patient's report): No Awareness, Dental Status Current problems with teeth and/or dentures?: Yes  (cavities) Does patient usually wear dentures?: No  CIWA:  CIWA-Ar Total: 0 COWS:  COWS Total Score: 0  Treatment Plan Summary: Daily contact with patient to assess and evaluate symptoms and progress in treatment Medication management  Plan: Continue crisis management and stabilization.  Medication management: Initiated Invega 9 mg daily for paranoid delusions and Cogentin 1 mg daily for prevention of EPS.  Encouraged patient to attend groups and participate in group counseling sessions and activities.  Discharge plan in progress.  Continue current treatment plan.  Address health issues: Internal medicine Consult in place to manage treatment of patient's Diabetes/HTN.   Medical Decision Making Problem Points:  Established problem, stable/improving (  1) and Review of psycho-social stressors (1) Data Points:  Review of medication regiment & side effects (2)  I certify that inpatient services furnished can reasonably be expected to improve the patient's condition.   Gordy Goar NP-C 04/02/2013, 2:39 PM

## 2013-04-03 LAB — GLUCOSE, CAPILLARY
Glucose-Capillary: 133 mg/dL — ABNORMAL HIGH (ref 70–99)
Glucose-Capillary: 123 mg/dL — ABNORMAL HIGH (ref 70–99)

## 2013-04-03 LAB — BASIC METABOLIC PANEL
Calcium: 9.4 mg/dL (ref 8.4–10.5)
Creatinine, Ser: 1.49 mg/dL — ABNORMAL HIGH (ref 0.50–1.35)
GFR calc Af Amer: 61 mL/min — ABNORMAL LOW (ref >90)
GFR calc non Af Amer: 53 mL/min — ABNORMAL LOW (ref >90)

## 2013-04-03 MED ORDER — MAGNESIUM OXIDE 400 (241.3 MG) MG PO TABS
400.0000 mg | ORAL_TABLET | Freq: Two times a day (BID) | ORAL | Status: DC
  Administered 2013-04-03 – 2013-04-09 (×13): 400 mg via ORAL
  Filled 2013-04-03 (×14): qty 1

## 2013-04-03 NOTE — Progress Notes (Signed)
Adult Psychoeducational Group Note  Date:  04/03/2013 Time:  11:00AM  Group Topic/Focus:  Relapse Prevention Planning:   The focus of this group is to define relapse and discuss the need for planning to combat relapse.  Participation Level:  Active  Participation Quality:  Appropriate and Sharing  Affect:  Appropriate  Cognitive:  Oriented  Insight: Improving  Engagement in Group:  Engaged  Modes of Intervention:  Discussion  Additional Comments:  Pt indicated that his support system was slacking at the moment and expressed his family's affluence has been a strain on him. He identified that sometimes his family can be a trigger. Pt did not go into detail about the specific strains or the actual incidents that led him to be hospitalized.He actively engaged in the group session giving by giving his input and remaining attentive.    Zoila Shutter R 04/03/2013, 1:11 PM

## 2013-04-03 NOTE — Progress Notes (Signed)
Patient ID: Ivan [name], male   DOB: Dec 17, 1960, 52 y.o.   MRN: [id number]  D: Pt denies SI/HI/AVH. Pt is pleasant and cooperative. Pt states he had a good day.   A: Pt was offered support and encouragement. Pt was given scheduled medications. Pt was encourage to attend groups. Q 15 minute checks were done for safety.   R:Pt attends groups and interacts well with peers and staff. Pt is taking medication. Pt has no complaints at this time.Pt receptive to treatment and safety maintained on unit.

## 2013-04-03 NOTE — BHH Group Notes (Signed)
[facility] LCSW Aftercare Discharge Planning Group Note   04/03/2013 7:54 AM  Participation Quality:  Minimal  Mood/Affect:  Flat  Depression Rating:  denies  Anxiety Rating:  denies  Thoughts of Suicide:  No Will you contract for safety?   NA  Current AVH:  No  Plan for Discharge/Comments:  Ivan [name] states he is not sure where he will go when he leaves [place].  States a motel is too expensive, so he needs to find an alternative.  The home he had been staying in is too big and requires too much gas to heat in the winter.  He is still paying off a $350.00 heating bill.  Is open to referral to an ACT team.  Will have him sign release today.  [employer]: sheriff  Supports:  none  [name], [name]

## 2013-04-03 NOTE — BHH Group Notes (Signed)
[facility] LCSW Group Therapy  04/03/2013  1:05 PM  Type of Therapy:  Group therapy  Participation Level:  Minimal  Participation Quality:  Attentive  Affect:  Flat  Cognitive:  Oriented  Insight:  Limited  Engagement in Therapy:  Limited  Modes of Intervention:  Discussion, Socialization  Summary of Progress/Problems:  Chaplain was here to lead a group on themes of hope and courage. Ivan [name] got out of bed to join group.  He spent most of group slumped in his chair with his eyes closed.  Said "pass" when asked if he had anything to add.[name] 04/03/2013 1:21 PM
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
## 2013-04-03 NOTE — Progress Notes (Signed)
Patient ID: Ivan [name], male   DOB: 02-04-1961, 52 y.o.   MRN: [id number] [facility] MD Progress Note  04/03/2013 9:21 AM Ivan [name]  MRN:  [id number] Subjective:  "I don't want to talk about my family." Objective: Patient reports decreased mood lability, however, he remains suspicious, paranoid and delusional. He is still upset with his family. Patient is compliant with his medication and has not endorsed any adverse reactions.  Diagnosis:   Axis I: Chronic Paranoid Schizophrenia Axis II: Deferred Axis III:  Past Medical History  Diagnosis Date  . Diabetes mellitus   . Hypertension   . Hypokalemia 04/02/2013   Axis IV: economic problems, housing problems, occupational problems, problems with access to health care services and problems with primary support group Axis V: 41-50 serious symptoms  ADL's:  Intact  Sleep: Good  Appetite:  Good  Suicidal Ideation:  Denies Homicidal Ideation:  Denies AEB (as evidenced by):  Psychiatric Specialty Exam: Review of Systems  Constitutional: Negative.   HENT: Negative.   Eyes: Negative.   Respiratory: Negative.   Cardiovascular: Negative.   Gastrointestinal: Negative.   Genitourinary: Negative.   Musculoskeletal: Negative.   Skin: Negative.   Neurological: Negative.   Endo/Heme/Allergies: Negative.   Psychiatric/Behavioral: Negative for depression, suicidal ideas, hallucinations, memory loss and substance abuse. The patient is nervous/anxious and has insomnia.     Blood pressure 129/83, pulse 99, temperature 99.6 F (37.6 C), temperature source Oral, resp. rate 18, height 6' (1.829 m).There is no weight on file to calculate BMI.  General Appearance: Disheveled  Eye Contact::  Poor  Speech:  Garbled  Volume:  Normal  Mood:  Anxious  Affect:  Flat  Thought Process:  Disorganized  Orientation:  Full (Time, Place, and Person)  Thought Content:  Paranoid Ideation and Rumination  Suicidal Thoughts:  No  Homicidal Thoughts:  No   Memory:  Immediate;   Fair Recent;   Fair Remote;   Fair  Judgement:  Poor  Insight:  Lacking  Psychomotor Activity:  Restlessness  Concentration:  Fair  Recall:  Poor  Akathisia:  No  Handed:  Right  AIMS (if indicated):     Assets:  Communication Skills Desire for Improvement Resilience  Sleep:  Number of Hours: 5.25   Current Medications: Current Facility-Administered Medications  Medication Dose Route Frequency Provider Last Rate Last Dose  . acetaminophen (TYLENOL) tablet 650 mg  650 mg Oral Q6H PRN Laverle Hobby, PA-C   650 mg at 04/03/13 [id number]  . alum & mag hydroxide-simeth (MAALOX/MYLANTA) 200-200-20 MG/5ML suspension 30 mL  30 mL Oral Q4H PRN Laverle Hobby, PA-C      . benztropine (COGENTIN) tablet 1 mg  1 mg Oral Daily Joao Mccurdy   1 mg at 04/03/13 0830  . hydrALAZINE (APRESOLINE) tablet 50 mg  50 mg Oral TID Eugenie Filler, MD   50 mg at 04/03/13 0829  . insulin aspart (novoLOG) injection 0-9 Units  0-9 Units Subcutaneous TID WC Belkys A Regalado, MD   1 Units at 04/03/13 0649  . magnesium hydroxide (MILK OF MAGNESIA) suspension 30 mL  30 mL Oral Daily PRN Laverle Hobby, PA-C      . magnesium oxide (MAG-OX) tablet 400 mg  400 mg Oral BID Eugenie Filler, MD   400 mg at 04/03/13 0829  . nicotine polacrilex (NICORETTE) gum 2 mg  2 mg Oral PRN Trayton Szabo   2 mg at 04/03/13 0829  . paliperidone (INVEGA) 24 hr tablet  9 mg  9 mg Oral Daily Juquan Reznick   9 mg at 04/03/13 0829  . potassium chloride SA (K-DUR,KLOR-CON) CR tablet 20 mEq  20 mEq Oral Daily Eugenie Filler, MD   20 mEq at 04/03/13 0829  . traZODone (DESYREL) tablet 50 mg  50 mg Oral QHS,MR X 1 Laverle Hobby, PA-C   50 mg at 04/02/13 2204    Lab Results:  Results for orders placed during the hospital encounter of 03/31/13 (from the past 48 hour(s))  GLUCOSE, CAPILLARY     Status: Abnormal   Collection Time    04/01/13 11:59 AM      Result Value Range   Glucose-Capillary 104 (*) 70 -  99 mg/dL  GLUCOSE, CAPILLARY     Status: Abnormal   Collection Time    04/01/13  5:05 PM      Result Value Range   Glucose-Capillary 113 (*) 70 - 99 mg/dL  GLUCOSE, CAPILLARY     Status: Abnormal   Collection Time    04/01/13  8:49 PM      Result Value Range   Glucose-Capillary 107 (*) 70 - 99 mg/dL   Comment 1 Notify RN    BASIC METABOLIC PANEL     Status: Abnormal   Collection Time    04/02/13  6:20 AM      Result Value Range   Sodium 138  135 - 145 mEq/L   Potassium 3.4 (*) 3.5 - 5.1 mEq/L   Chloride 104  96 - 112 mEq/L   CO2 27  19 - 32 mEq/L   Glucose, Bld 128 (*) 70 - 99 mg/dL   BUN 19  6 - 23 mg/dL   Creatinine, Ser 1.49 (*) 0.50 - 1.35 mg/dL   Calcium 9.5  8.4 - 10.5 mg/dL   GFR calc non Af Amer 53 (*) >90 mL/min   GFR calc Af Amer 61 (*) >90 mL/min   Comment: (NOTE)     The eGFR has been calculated using the CKD EPI equation.     This calculation has not been validated in all clinical situations.     eGFR's persistently <90 mL/min signify possible Chronic Kidney     Disease.     Performed at [facility]  GLUCOSE, CAPILLARY     Status: Abnormal   Collection Time    04/02/13  6:39 AM      Result Value Range   Glucose-Capillary 139 (*) 70 - 99 mg/dL  GLUCOSE, CAPILLARY     Status: Abnormal   Collection Time    04/02/13 11:40 AM      Result Value Range   Glucose-Capillary 130 (*) 70 - 99 mg/dL  GLUCOSE, CAPILLARY     Status: Abnormal   Collection Time    04/02/13  5:11 PM      Result Value Range   Glucose-Capillary 125 (*) 70 - 99 mg/dL   Comment 1 Notify RN    BASIC METABOLIC PANEL     Status: Abnormal   Collection Time    04/02/13  7:45 PM      Result Value Range   Sodium 140  135 - 145 mEq/L   Potassium 3.5  3.5 - 5.1 mEq/L   Chloride 104  96 - 112 mEq/L   CO2 28  19 - 32 mEq/L   Glucose, Bld 151 (*) 70 - 99 mg/dL   BUN 18  6 - 23 mg/dL   Creatinine, Ser 1.50 (*) 0.50 -  1.35 mg/dL   Calcium 9.6  8.4 - 10.5 mg/dL   GFR calc non Af  Amer 53 (*) >90 mL/min   GFR calc Af Amer 61 (*) >90 mL/min   Comment: (NOTE)     The eGFR has been calculated using the CKD EPI equation.     This calculation has not been validated in all clinical situations.     eGFR's persistently <90 mL/min signify possible Chronic Kidney     Disease.     Performed at [facility]  MAGNESIUM     Status: None   Collection Time    04/02/13  7:45 PM      Result Value Range   Magnesium 1.5  1.5 - 2.5 mg/dL   Comment: Performed at [place], CAPILLARY     Status: Abnormal   Collection Time    04/02/13  9:45 PM      Result Value Range   Glucose-Capillary 159 (*) 70 - 99 mg/dL   Comment 1 Documented in Chart    BASIC METABOLIC PANEL     Status: Abnormal   Collection Time    04/03/13  6:15 AM      Result Value Range   Sodium 139  135 - 145 mEq/L   Potassium 3.5  3.5 - 5.1 mEq/L   Chloride 106  96 - 112 mEq/L   CO2 24  19 - 32 mEq/L   Glucose, Bld 140 (*) 70 - 99 mg/dL   BUN 14  6 - 23 mg/dL   Creatinine, Ser 1.49 (*) 0.50 - 1.35 mg/dL   Calcium 9.4  8.4 - 10.5 mg/dL   GFR calc non Af Amer 53 (*) >90 mL/min   GFR calc Af Amer 61 (*) >90 mL/min   Comment: (NOTE)     The eGFR has been calculated using the CKD EPI equation.     This calculation has not been validated in all clinical situations.     eGFR's persistently <90 mL/min signify possible Chronic Kidney     Disease.     Performed at [facility]    Physical Findings: AIMS: Facial and Oral Movements Muscles of Facial Expression: None, normal Lips and Perioral Area: None, normal Jaw: None, normal Tongue: None, normal,Extremity Movements Upper (arms, wrists, hands, fingers): None, normal Lower (legs, knees, ankles, toes): None, normal, Trunk Movements Neck, shoulders, hips: None, normal, Overall Severity Severity of abnormal movements (highest score from questions above): None, normal Incapacitation due to abnormal  movements: None, normal Patient's awareness of abnormal movements (rate only patient's report): No Awareness, Dental Status Current problems with teeth and/or dentures?: Yes Does patient usually wear dentures?: No  CIWA:  CIWA-Ar Total: 0 COWS:  COWS Total Score: 0  Treatment Plan Summary: Daily contact with patient to assess and evaluate symptoms and progress in treatment Medication management  Plan: Continue crisis management and stabilization.  Medication management:  Continue Invega 9 mg daily for paranoid delusions and Cogentin 1 mg daily for prevention of EPS.  Encouraged patient to attend groups and participate in group counseling sessions and activities.  Discharge plan in progress.  Continue current treatment plan.  Address health issues: Internal medicine Consult in place to manage treatment of patient's Diabetes/HTN.   Medical Decision Making Problem Points:  Established problem, improving (1) and Review of psycho-social stressors (1) Data Points:  Review of medication regiment & side effects (2)  I certify that inpatient services furnished can reasonably be  expected to improve the patient's condition.   Necia Kamm,MD 04/03/2013, 9:21 AM

## 2013-04-03 NOTE — Progress Notes (Signed)
Patient ID: Ivan [name], male   DOB: [birth year], 52 y.o.   MRN: [id number] D: pt. Suspicious, appeared irritable today, answers short and curt.  A: Writer reviewed meds and encouraged participation. Staff will monitor q7min for safety. R: pt. Is safe on the unit. Pt. attended karaoke.

## 2013-04-03 NOTE — Progress Notes (Signed)
Adult Psychoeducational Group Note  Date:  04/03/2013 Time: 2000 Group Topic/Focus:  Wrap-Up Group:   The focus of this group is to help patients review their daily goal of treatment and discuss progress on daily workbooks.  Participation Level:  None  Participation Quality:  None  Affect:  Appropriate  Cognitive:  None  Insight: None  Engagement in Group:  None  Modes of Intervention:  Discussion, Problem-solving and Rapport Building  Additional Comments:  Pt was present in group but didn't participate in group discussion  Larkin Ina Patience 04/03/2013, 9:47 PM

## 2013-04-04 LAB — GLUCOSE, CAPILLARY
Glucose-Capillary: 143 mg/dL — ABNORMAL HIGH (ref 70–99)
Glucose-Capillary: 153 mg/dL — ABNORMAL HIGH (ref 70–99)

## 2013-04-04 MED ORDER — AMLODIPINE BESYLATE 10 MG PO TABS
10.0000 mg | ORAL_TABLET | Freq: Every day | ORAL | Status: DC
  Administered 2013-04-04 – 2013-04-09 (×6): 10 mg via ORAL
  Filled 2013-04-04 (×8): qty 1

## 2013-04-04 MED ORDER — HYDRALAZINE HCL 50 MG PO TABS
100.0000 mg | ORAL_TABLET | Freq: Three times a day (TID) | ORAL | Status: DC
  Administered 2013-04-04 – 2013-04-07 (×9): 100 mg via ORAL
  Filled 2013-04-04 (×14): qty 2

## 2013-04-04 NOTE — BHH Group Notes (Signed)
[place] Group Notes:  (Clinical Social Work)  04/04/2013  11:15-11:45AM  Summary of Progress/Problems:   The main focus of today's process group was for the patient to identify ways in which they have in the past sabotaged their own recovery and reasons they may have done this/what they received from doing it.  We then worked to identify a specific plan to avoid doing this when discharged from the hospital for this admission.  The patient expressed he self-sabotages by eating improperly, which is a problem because he is borderline diabetic.  Type of Therapy:  Group Therapy - Process  Participation Level:  Minimal  Participation Quality:  Drowsy  Affect:  Blunted  Cognitive:  Oriented  Insight:  Developing/Improving  Engagement in Therapy:  Developing/Improving  Modes of Intervention:  Clarification, Education, Exploration, Discussion  Selmer Dominion, LCSW 04/04/2013, 1:21 PM

## 2013-04-04 NOTE — Progress Notes (Addendum)
[facility] PROGRESS NOTE  Ivan [name] [id number] DOB: October 04, 1960 DOA: 03/31/2013 PCP: Cleda Mccreedy, MD  Assessment/Plan: #1 Well controlled diabetes mellitus Hemoglobin A1c 7.1. CBGs have ranged from 117-194. Patient with chronic kidney disease and creatinine approximately at 1.5. Would recommend discontinuing metformin on discharge and start patient on low-dose glipizide 5mg  daily on discharge. Continue sliding scale insulin for now.  #2 hypertension Systolic blood pressures elevated. Patient's hydralazine home dose has been resumed at 50 mg twice daily. Increase hydralazine to 100 mg 3 times daily. Start Norvasc 10 mg daily. Follow and up titrate as needed.  #4 hypokalemia Patient with a history of hypokalemia on chronic potassium supplementation. Repleted potassium. Continue home regimen of 20 mEq of KCl daily.  #5 tachycardia EKG with sinus tachycardia resolved.  #6 chronic kidney disease stage III. Stable. Follow.  #7 chronic paranoid schizophrenia Per primary team.  Code Status: Full Family Communication: updated patient no family at bedside. Disposition Plan: Per primary team   Consultants:  None  Procedures:  None  Antibiotics:  None  HPI/Subjective: Patient with no complaints. Patient sleeping, easily arousable.  Objective: Filed Vitals:   04/04/13 1300  BP: 152/90  Pulse:   Temp:   Resp:    No intake or output data in the 24 hours ending 04/04/13 1347 There were no vitals filed for this visit.  Exam:   General:   NAD  Cardiovascular: RRR  Respiratory: CTAB  Abdomen: Soft/NT/OBESE/+BS  Musculoskeletal: 5/5 BUE strength, 5/5 BLE strength   Data Reviewed: Basic Metabolic Panel:  Recent Labs Lab 04/01/13 0610 04/02/13 0620 04/02/13 1945 04/03/13 0615  NA 137 138 140 139  K 3.2* 3.4* 3.5 3.5  CL 101 104 104 106  CO2 27 27 28 24   GLUCOSE 119* 128* 151* 140*  BUN 19 19 18 14   CREATININE 1.50* 1.49* 1.50* 1.49*   CALCIUM 10.1 9.5 9.6 9.4  MG  --   --  1.5  --    Liver Function Tests:  Recent Labs Lab 04/01/13 0610  AST 26  ALT 28  ALKPHOS 100  BILITOT 0.2*  PROT 7.2  ALBUMIN 3.1*   No results found for this basename: LIPASE, AMYLASE,  in the last 168 hours No results found for this basename: AMMONIA,  in the last 168 hours CBC: No results found for this basename: WBC, NEUTROABS, HGB, HCT, MCV, PLT,  in the last 168 hours Cardiac Enzymes: No results found for this basename: CKTOTAL, CKMB, CKMBINDEX, TROPONINI,  in the last 168 hours BNP (last 3 results) No results found for this basename: PROBNP,  in the last 8760 hours CBG:  Recent Labs Lab 04/03/13 1211 04/03/13 1658 04/03/13 2039 04/04/13 0624 04/04/13 1203  GLUCAP 123* 151* 194* 117* 155*    No results found for this or any previous visit (from the past 240 hour(s)).   Studies: No results found.  Scheduled Meds: . amLODipine  10 mg Oral Daily  . benztropine  1 mg Oral Daily  . hydrALAZINE  50 mg Oral TID  . insulin aspart  0-9 Units Subcutaneous TID WC  . magnesium oxide  400 mg Oral BID  . paliperidone  9 mg Oral Daily  . potassium chloride  20 mEq Oral Daily  . traZODone  50 mg Oral QHS,MR X 1   Continuous Infusions:   Principal Problem:   Paranoid schizophrenia, chronic condition Active Problems:   Diabetes   Essential hypertension, benign   Hypokalemia    Time spent: 30  mins    [facility] Pager [phone]. If 7PM-7AM, please contact night-coverage at [email or web address], password [facility] 04/04/2013, 1:47 PM  LOS: 4 days

## 2013-04-04 NOTE — Progress Notes (Signed)
Adult Psychoeducational Group Note  Date:  04/04/2013 Time:  3:00PM Group Topic/Focus:  Healthy Coping Skills  Participation Level:  Active  Participation Quality:  Appropriate and Attentive  Affect:  Appropriate  Cognitive:  Alert and Appropriate  Insight: Appropriate  Engagement in Group:  Engaged  Modes of Intervention:  Discussion  Additional Comments:  Pt. Was attentive and appropriate during today's group discussion. Pt. Was able to engaged in positive communication and interaction with peers. Pt was able to discuss Love Languages out of today's Clinical cytogeneticist.. Pt was able to take quiz. Pt stated that he scored high on acts of service. Pt shared how he helps his family.   Theodoro Grist D 04/04/2013, 3:36 PM

## 2013-04-04 NOTE — Progress Notes (Signed)
Patient ID: TC ALFARO, male   DOB: [birth year], 52 y.o.   MRN: [id number]  D: Pt denies SI/HI/AVH/pain. Pt is pleasant and cooperative. Pt states his back pain has gone away. "the bump on my back burst and is draining and the pain is gone." Pt states he's ready to get back to life on the outside.   A: Pt was offered support and encouragement. Pt was given scheduled medications. Pt was encourage to attend groups. Q 15 minute checks were done for safety.   R:Pt attends groups and interacts well with peers and staff. Pt is taking medication. Pt has no complaints at this time.Pt receptive to treatment and safety maintained on unit.

## 2013-04-04 NOTE — Progress Notes (Signed)
The focus of this group is to help patients review their daily goal of treatment and discuss progress on daily workbooks. Pt attended the evening group session and responded to all discussion prompts from the [place]. Pt reported having a good day on account of his receiving dropped off clothes and no longer feeling the pain or soreness he felt in previous days. Pt reported having no additional needs from Nursing Staff this evening aside from towels, which he was given following group. Pt shared that his long term goals included being in "fair health" and starting a new business.

## 2013-04-04 NOTE — BHH Group Notes (Signed)
[place] Group Notes:  (Nursing/MHT/Case Management/Adjunct)  Date:  04/04/2013  Time:  11:09 AM  Type of Therapy:  Psychoeducational Skills  Participation Level:  Minimal  Participation Quality:  Inattentive  Affect:  Flat and Irritable  Cognitive:  Alert  Insight:  Lacking  Engagement in Group:  Limited  Modes of Intervention:  Discussion and Education  Summary of Progress/Problems: self inventory review and discussion of healthy coping skills. Pt did not elaborate or share during group.  Leonia Reader 04/04/2013, 11:09 AM

## 2013-04-04 NOTE — Progress Notes (Signed)
Patient ID: Ivan [name], male   DOB: 10-02-1960, 52 y.o.   MRN: [id number] [facility] MD Progress Note  04/04/2013 9:55 PM EULES CRUMBLISS  MRN:  [id number] Subjective:      He remains suspicious, paranoid and delusional. . Patient is compliant with his medication and has not endorsed any adverse reactions.  Diagnosis:   Axis I: Chronic Paranoid Schizophrenia Axis II: Deferred Axis III:  Past Medical History  Diagnosis Date  . Diabetes mellitus   . Hypertension   . Hypokalemia 04/02/2013   Axis IV: economic problems, housing problems, occupational problems, problems with access to health care services and problems with primary support group Axis V: 41-50 serious symptoms  ADL's:  Intact  Sleep: Good  Appetite:  Good  Suicidal Ideation:  Denies Homicidal Ideation:  Denies AEB (as evidenced by):  Psychiatric Specialty Exam: Review of Systems  Constitutional: Negative.   HENT: Negative.   Eyes: Negative.   Respiratory: Negative.   Cardiovascular: Negative.   Gastrointestinal: Negative.   Genitourinary: Negative.   Musculoskeletal: Negative.   Skin: Negative.   Neurological: Negative.   Endo/Heme/Allergies: Negative.   Psychiatric/Behavioral: Negative for depression, suicidal ideas, hallucinations, memory loss and substance abuse. The patient is nervous/anxious and has insomnia.     Blood pressure 152/90, pulse 87, temperature 97.4 F (36.3 C), temperature source Oral, resp. rate 18, height 6' (1.829 m).There is no weight on file to calculate BMI.  General Appearance: Disheveled  Eye Contact::  Poor  Speech:  Garbled  Volume:  Normal  Mood:  Anxious  Affect:  Flat  Thought Process:  Disorganized  Orientation:  Full (Time, Place, and Person)  Thought Content:  Paranoid Ideation and Rumination  Suicidal Thoughts:  No  Homicidal Thoughts:  No  Memory:  Immediate;   Fair Recent;   Fair Remote;   Fair  Judgement:  Poor  Insight:  Lacking  Psychomotor Activity:   Restlessness  Concentration:  Fair  Recall:  Poor  Akathisia:  No  Handed:  Right  AIMS (if indicated):     Assets:  Communication Skills Desire for Improvement Resilience  Sleep:  Number of Hours: 5   Current Medications: Current Facility-Administered Medications  Medication Dose Route Frequency Provider Last Rate Last Dose  . acetaminophen (TYLENOL) tablet 650 mg  650 mg Oral Q6H PRN Laverle Hobby, PA-C   650 mg at 04/04/13 0330  . alum & mag hydroxide-simeth (MAALOX/MYLANTA) 200-200-20 MG/5ML suspension 30 mL  30 mL Oral Q4H PRN Laverle Hobby, PA-C      . amLODipine (NORVASC) tablet 10 mg  10 mg Oral Daily Eugenie Filler, MD   10 mg at 04/04/13 0841  . benztropine (COGENTIN) tablet 1 mg  1 mg Oral Daily Mojeed Akintayo   1 mg at 04/04/13 0742  . hydrALAZINE (APRESOLINE) tablet 100 mg  100 mg Oral TID Eugenie Filler, MD   100 mg at 04/04/13 1605  . insulin aspart (novoLOG) injection 0-9 Units  0-9 Units Subcutaneous TID WC Belkys A Regalado, MD   1 Units at 04/04/13 1725  . magnesium hydroxide (MILK OF MAGNESIA) suspension 30 mL  30 mL Oral Daily PRN Laverle Hobby, PA-C      . magnesium oxide (MAG-OX) tablet 400 mg  400 mg Oral BID Eugenie Filler, MD   400 mg at 04/04/13 1605  . nicotine polacrilex (NICORETTE) gum 2 mg  2 mg Oral PRN Mojeed Akintayo   2 mg at 04/04/13 1605  .  paliperidone (INVEGA) 24 hr tablet 9 mg  9 mg Oral Daily Mojeed Akintayo   9 mg at 04/04/13 0742  . potassium chloride SA (K-DUR,KLOR-CON) CR tablet 20 mEq  20 mEq Oral Daily Eugenie Filler, MD   20 mEq at 04/04/13 0742  . traZODone (DESYREL) tablet 50 mg  50 mg Oral QHS,MR X 1 Laverle Hobby, PA-C   50 mg at 04/04/13 2146    Lab Results:  Results for orders placed during the hospital encounter of 03/31/13 (from the past 48 hour(s))  BASIC METABOLIC PANEL     Status: Abnormal   Collection Time    04/03/13  6:15 AM      Result Value Range   Sodium 139  135 - 145 mEq/L   Potassium 3.5  3.5 -  5.1 mEq/L   Chloride 106  96 - 112 mEq/L   CO2 24  19 - 32 mEq/L   Glucose, Bld 140 (*) 70 - 99 mg/dL   BUN 14  6 - 23 mg/dL   Creatinine, Ser 1.49 (*) 0.50 - 1.35 mg/dL   Calcium 9.4  8.4 - 10.5 mg/dL   GFR calc non Af Amer 53 (*) >90 mL/min   GFR calc Af Amer 61 (*) >90 mL/min   Comment: (NOTE)     The eGFR has been calculated using the CKD EPI equation.     This calculation has not been validated in all clinical situations.     eGFR's persistently <90 mL/min signify possible Chronic Kidney     Disease.     Performed at [facility]  GLUCOSE, CAPILLARY     Status: Abnormal   Collection Time    04/03/13  6:19 AM      Result Value Range   Glucose-Capillary 133 (*) 70 - 99 mg/dL  GLUCOSE, CAPILLARY     Status: Abnormal   Collection Time    04/03/13 12:11 PM      Result Value Range   Glucose-Capillary 123 (*) 70 - 99 mg/dL  GLUCOSE, CAPILLARY     Status: Abnormal   Collection Time    04/03/13  4:58 PM      Result Value Range   Glucose-Capillary 151 (*) 70 - 99 mg/dL   Comment 1 Notify RN    GLUCOSE, CAPILLARY     Status: Abnormal   Collection Time    04/03/13  8:39 PM      Result Value Range   Glucose-Capillary 194 (*) 70 - 99 mg/dL  GLUCOSE, CAPILLARY     Status: Abnormal   Collection Time    04/04/13  6:24 AM      Result Value Range   Glucose-Capillary 117 (*) 70 - 99 mg/dL  GLUCOSE, CAPILLARY     Status: Abnormal   Collection Time    04/04/13 12:03 PM      Result Value Range   Glucose-Capillary 155 (*) 70 - 99 mg/dL  GLUCOSE, CAPILLARY     Status: Abnormal   Collection Time    04/04/13  5:07 PM      Result Value Range   Glucose-Capillary 143 (*) 70 - 99 mg/dL  GLUCOSE, CAPILLARY     Status: Abnormal   Collection Time    04/04/13  8:48 PM      Result Value Range   Glucose-Capillary 153 (*) 70 - 99 mg/dL   Comment 1 Notify RN      Physical Findings: AIMS: Facial and Oral Movements Muscles of Facial  Expression: None, normal Lips and  Perioral Area: None, normal Jaw: None, normal Tongue: None, normal,Extremity Movements Upper (arms, wrists, hands, fingers): None, normal Lower (legs, knees, ankles, toes): None, normal, Trunk Movements Neck, shoulders, hips: None, normal, Overall Severity Severity of abnormal movements (highest score from questions above): None, normal Incapacitation due to abnormal movements: None, normal Patient's awareness of abnormal movements (rate only patient's report): No Awareness, Dental Status Current problems with teeth and/or dentures?: Yes Does patient usually wear dentures?: No  CIWA:  CIWA-Ar Total: 0 COWS:  COWS Total Score: 0  Treatment Plan Summary: Daily contact with patient to assess and evaluate symptoms and progress in treatment Medication management  Plan: Continue crisis management and stabilization.     Medical Decision Making Problem Points:  Established problem, improving (1) and Review of psycho-social stressors (1) Data Points:  Review of medication regiment & side effects (2)  I certify that inpatient services furnished can reasonably be expected to improve the patient's condition.   Aitan Rossbach,MD 04/04/2013, 9:55 PM

## 2013-04-05 LAB — GLUCOSE, CAPILLARY
Glucose-Capillary: 144 mg/dL — ABNORMAL HIGH (ref 70–99)
Glucose-Capillary: 172 mg/dL — ABNORMAL HIGH (ref 70–99)

## 2013-04-05 NOTE — Progress Notes (Signed)
Patient ID: Ivan [name], male   DOB: 05/14/1961, 52 y.o.   MRN: [id number] [facility] MD Progress Note  04/05/2013 5:21 PM Ivan [name]  MRN:  [id number] Subjective:      Attending group today. He remains  paranoid and delusional. . Patient is compliant with his medication and has not endorsed any adverse reactions.  Diagnosis:   Axis I: Chronic Paranoid Schizophrenia Axis II: Deferred Axis III:  Past Medical History  Diagnosis Date  . Diabetes mellitus   . Hypertension   . Hypokalemia 04/02/2013   Axis IV: economic problems, housing problems, occupational problems, problems with access to health care services and problems with primary support group Axis V: 41-50 serious symptoms  ADL's:  Intact  Sleep: Good  Appetite:  Good  Suicidal Ideation:  Denies Homicidal Ideation:  Denies AEB (as evidenced by):  Psychiatric Specialty Exam: Review of Systems  Constitutional: Negative.   HENT: Negative.   Eyes: Negative.   Respiratory: Negative.   Cardiovascular: Negative.   Gastrointestinal: Negative.   Genitourinary: Negative.   Musculoskeletal: Negative.   Skin: Negative.   Neurological: Negative.   Endo/Heme/Allergies: Negative.   Psychiatric/Behavioral: Negative for depression, suicidal ideas, hallucinations, memory loss and substance abuse. The patient is nervous/anxious and has insomnia.     Blood pressure 150/88, pulse 90, temperature 97.7 F (36.5 C), temperature source Oral, resp. rate 20, height 6' (1.829 m).There is no weight on file to calculate BMI.  General Appearance: Disheveled  Eye Contact::  Poor  Speech:  Garbled  Volume:  Normal  Mood:  Not good  Affect:  Flat  Thought Process:  Disorganized  Orientation:  Full (Time, Place, and Person)  Thought Content:  Paranoid Ideation and Rumination  Suicidal Thoughts:  No  Homicidal Thoughts:  No  Memory:  Immediate;   Fair Recent;   Fair Remote;   Fair  Judgement:  Poor  Insight:  Lacking  Psychomotor  Activity:  Restlessness  Concentration:  Fair  Recall:  Poor  Akathisia:  No  Handed:  Right  AIMS (if indicated):     Assets:  Communication Skills Desire for Improvement Resilience  Sleep:  Number of Hours: 3.25   Current Medications: Current Facility-Administered Medications  Medication Dose Route Frequency Provider Last Rate Last Dose  . acetaminophen (TYLENOL) tablet 650 mg  650 mg Oral Q6H PRN Laverle Hobby, PA-C   650 mg at 04/04/13 0330  . alum & mag hydroxide-simeth (MAALOX/MYLANTA) 200-200-20 MG/5ML suspension 30 mL  30 mL Oral Q4H PRN Laverle Hobby, PA-C      . amLODipine (NORVASC) tablet 10 mg  10 mg Oral Daily Eugenie Filler, MD   10 mg at 04/05/13 0824  . benztropine (COGENTIN) tablet 1 mg  1 mg Oral Daily Mojeed Akintayo   1 mg at 04/05/13 0824  . hydrALAZINE (APRESOLINE) tablet 100 mg  100 mg Oral TID Eugenie Filler, MD   100 mg at 04/05/13 1719  . insulin aspart (novoLOG) injection 0-9 Units  0-9 Units Subcutaneous TID WC Belkys A Regalado, MD   2 Units at 04/05/13 1719  . magnesium hydroxide (MILK OF MAGNESIA) suspension 30 mL  30 mL Oral Daily PRN Laverle Hobby, PA-C      . magnesium oxide (MAG-OX) tablet 400 mg  400 mg Oral BID Eugenie Filler, MD   400 mg at 04/05/13 1719  . nicotine polacrilex (NICORETTE) gum 2 mg  2 mg Oral PRN Mojeed Akintayo   2 mg  at 04/05/13 0825  . paliperidone (INVEGA) 24 hr tablet 9 mg  9 mg Oral Daily Mojeed Akintayo   9 mg at 04/05/13 0824  . potassium chloride SA (K-DUR,KLOR-CON) CR tablet 20 mEq  20 mEq Oral Daily Eugenie Filler, MD   20 mEq at 04/05/13 0824  . traZODone (DESYREL) tablet 50 mg  50 mg Oral QHS,MR X 1 Laverle Hobby, PA-C   50 mg at 04/04/13 2146    Lab Results:  Results for orders placed during the hospital encounter of 03/31/13 (from the past 48 hour(s))  GLUCOSE, CAPILLARY     Status: Abnormal   Collection Time    04/03/13  8:39 PM      Result Value Range   Glucose-Capillary 194 (*) 70 - 99 mg/dL   GLUCOSE, CAPILLARY     Status: Abnormal   Collection Time    04/04/13  6:24 AM      Result Value Range   Glucose-Capillary 117 (*) 70 - 99 mg/dL  GLUCOSE, CAPILLARY     Status: Abnormal   Collection Time    04/04/13 12:03 PM      Result Value Range   Glucose-Capillary 155 (*) 70 - 99 mg/dL  GLUCOSE, CAPILLARY     Status: Abnormal   Collection Time    04/04/13  5:07 PM      Result Value Range   Glucose-Capillary 143 (*) 70 - 99 mg/dL  GLUCOSE, CAPILLARY     Status: Abnormal   Collection Time    04/04/13  8:48 PM      Result Value Range   Glucose-Capillary 153 (*) 70 - 99 mg/dL   Comment 1 Notify RN    GLUCOSE, CAPILLARY     Status: Abnormal   Collection Time    04/05/13  6:14 AM      Result Value Range   Glucose-Capillary 133 (*) 70 - 99 mg/dL   Comment 1 Notify RN    GLUCOSE, CAPILLARY     Status: Abnormal   Collection Time    04/05/13 12:03 PM      Result Value Range   Glucose-Capillary 144 (*) 70 - 99 mg/dL   Comment 1 Documented in Chart     Comment 2 Notify RN    GLUCOSE, CAPILLARY     Status: Abnormal   Collection Time    04/05/13  5:05 PM      Result Value Range   Glucose-Capillary 164 (*) 70 - 99 mg/dL   Comment 1 Documented in Chart     Comment 2 Notify RN      Physical Findings: AIMS: Facial and Oral Movements Muscles of Facial Expression: None, normal Lips and Perioral Area: None, normal Jaw: None, normal Tongue: None, normal,Extremity Movements Upper (arms, wrists, hands, fingers): None, normal Lower (legs, knees, ankles, toes): None, normal, Trunk Movements Neck, shoulders, hips: None, normal, Overall Severity Severity of abnormal movements (highest score from questions above): None, normal Incapacitation due to abnormal movements: None, normal Patient's awareness of abnormal movements (rate only patient's report): No Awareness, Dental Status Current problems with teeth and/or dentures?: Yes Does patient usually wear dentures?: No  CIWA:  CIWA-Ar  Total: 0 COWS:  COWS Total Score: 0  Treatment Plan Summary: Daily contact with patient to assess and evaluate symptoms and progress in treatment Medication management  Plan: Continue current meds    Medical Decision Making Problem Points:  Established problem, improving (1) and Review of psycho-social stressors (1) Data Points:  Review of  medication regiment & side effects (2)  I certify that inpatient services furnished can reasonably be expected to improve the patient's condition.   Goddess Gebbia,MD 04/05/2013, 5:21 PM

## 2013-04-05 NOTE — [facility] Group Notes (Signed)
[place] Group Notes:  (Clinical Social Work)  04/05/2013   11:15am-12:00pm  Summary of Progress/Problems:  The main focus of today's process group was to listen to a variety of genres of music and to identify that different types of music provoke different responses.  The patient then was able to identify personally what was soothing for them, as well as energizing.  Handouts were used to record feelings evoked, as well as how patient can personally use this knowledge in sleep habits, with depression, and with other symptoms.  The patient expressed understanding of concepts, as well as knowledge of how each type of music affected them and how this can be used when they are at home as a tool in their recovery.  He seemed to enjoy this group.  Type of Therapy:  Music Therapy   Participation Level:  Active  Participation Quality:  Attentive and Sharing  Affect:  Blunted  Cognitive:  Oriented  Insight:  Engaged  Engagement in Therapy:  Engaged  Modes of Intervention:   Activity, Exploration  Selmer Dominion, LCSW 04/05/2013, 1:58 PM

## 2013-04-05 NOTE — Progress Notes (Signed)
Patient ID: NAJAY WESCHE, male   DOB: Oct 15, 1960, 52 y.o.   MRN: [id number] Psychoeducational Group Note  Date:  04/05/2013 Time:  1000am  Group Topic/Focus:  Making Healthy Choices:   The focus of this group is to help patients identify negative/unhealthy choices they were using prior to admission and identify positive/healthier coping strategies to replace them upon discharge.  Participation Level:  Minimal  Participation Quality:  Appropriate  Affect:  Blunted  Cognitive:  Disorganized  Insight:  Resistant  Engagement in Group:  Resistant  Additional Comments:  Inventory and Psychoeducational group   Pricilla Larsson 04/05/2013,10:38 AM

## 2013-04-05 NOTE — Progress Notes (Signed)
Adult Psychoeducational Group Note  Date:  04/05/2013 Time:  8:00PM Group Topic/Focus:  Wrap-Up Group:   The focus of this group is to help patients review their daily goal of treatment and discuss progress on daily workbooks.  Participation Level:  Active  Participation Quality:  Appropriate and Attentive  Affect:  Appropriate  Cognitive:  Alert and Appropriate  Insight: Appropriate  Engagement in Group:  Engaged  Modes of Intervention:  Discussion  Additional Comments:  Pt. Was attentive and appropriate in tonight's group discussion. Pt stated that he had a overall good day. Pt shared how enjoyed going outside and was able to have positive interaction with peer while in the cafe for dinner. Pt shared how his family was able to bring him needed goods.   Theodoro Grist D 04/05/2013, 9:36 PM

## 2013-04-05 NOTE — Progress Notes (Signed)
Pt. Was out in the milieu  But little interaction with his peers.  Pt. Did report that his family visited but still states that they are part of his problem or stating that he is having problems with them, unsure if this is just part of his paranoia or there was an issue with his family at home.  Pt. Denies SI and HI, denies AH and or VH.  No complaints of pain or discomfort.

## 2013-04-06 DIAGNOSIS — F411 Generalized anxiety disorder: Secondary | ICD-10-CM

## 2013-04-06 LAB — GLUCOSE, CAPILLARY
Glucose-Capillary: 120 mg/dL — ABNORMAL HIGH (ref 70–99)
Glucose-Capillary: 158 mg/dL — ABNORMAL HIGH (ref 70–99)
Glucose-Capillary: 169 mg/dL — ABNORMAL HIGH (ref 70–99)

## 2013-04-06 MED ORDER — HYDROCHLOROTHIAZIDE 25 MG PO TABS
25.0000 mg | ORAL_TABLET | Freq: Every day | ORAL | Status: DC
  Administered 2013-04-06 – 2013-04-07 (×2): 25 mg via ORAL
  Filled 2013-04-06 (×5): qty 1

## 2013-04-06 MED ORDER — METOPROLOL TARTRATE 25 MG PO TABS
25.0000 mg | ORAL_TABLET | Freq: Two times a day (BID) | ORAL | Status: DC
  Administered 2013-04-06 – 2013-04-07 (×3): 25 mg via ORAL
  Filled 2013-04-06 (×7): qty 1

## 2013-04-06 MED ORDER — POTASSIUM CHLORIDE CRYS ER 10 MEQ PO TBCR
10.0000 meq | EXTENDED_RELEASE_TABLET | Freq: Two times a day (BID) | ORAL | Status: DC
Start: 2013-04-06 — End: 2013-04-09
  Administered 2013-04-06 – 2013-04-09 (×6): 10 meq via ORAL
  Filled 2013-04-06 (×10): qty 1
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
## 2013-04-06 NOTE — Progress Notes (Signed)
Recreation Therapy Notes  Date: 08.18.2014 Time: 9:30am Location: [address] Dayroom  Group Topic: Decision Making  Goal Area(s) Addresses:  Patient will verbalize benefit of using good decision making skills. Patient will verbalize way to encourage good decision making in personal life.  Behavioral Response: Engaged, Appropriate   Intervention: Questions & Answers  Activity: Choices In a Jar. Patients were asked to select a card from the Choices in a Jar jar and select one option on the card. Each card contains an either or question used to encourage decision making skills.    Education: Discharge Planning, Personal Organization  Education Outcome: Acknowledges understanding  Clinical Observations/Feedback: Patient actively participated in activity, contributing to both opening and wrap up discussion, as well as actively participating in activity.  Patient made point that personal values influence decision making and if those are "not in order" it can cause you to make poor decisions. Patient additionally verbally agreed with maintaining a calendar to keep track of decisions made regarding health and wellness.   Patient had some difficulty reading card he selected, it is unclear to LRT if difficulty was result of needing glasses as patient squinted and brought card very close to face or if patient has reading difficulty.   Laureen Ochs Sloane Palmer, LRT/CTRS  Remberto Lienhard L 04/06/2013 1:21 PM

## 2013-04-06 NOTE — Progress Notes (Addendum)
Patient ID: Ivan [name], male   DOB: 28-Sep-1960, 52 y.o.   MRN: [id number] D: Patient's affect is brighter today; however, he continues to be guarded.He denies any SI/HI/AVH.  Patient is compliant with his medications.  Patient lives near his family, but is not sure where he will go upon discharge. Patient has family conflict which is one of his main stressors.  He states the medications are working for him. He did complain about some edema in his ankles which the NP is assessing.  Patient is attending groups with minimal participation.  A: Continue to monitor medication management and MD orders.  Safety checks completed every 15 minutes per protocol.  R: Patient's behavior is appropriate.

## 2013-04-06 NOTE — BHH Group Notes (Signed)
[place] LCSW Group Therapy  04/06/2013  1:15 PM   Type of Therapy:  Group Therapy  Participation Level:  Active  Participation Quality:  Appropriate and Attentive  Affect:  Appropriate and Flat  Cognitive:  Alert and Appropriate  Insight:  Developing/Improving and Engaged  Engagement in Therapy:  Developing/Improving and Engaged  Modes of Intervention:  Clarification, Confrontation, Discussion, Education, Exploration, Limit-setting, Orientation, Problem-solving, Rapport Building, Art therapist, Socialization and Support  Summary of Progress/Problems: Pt identified obstacles faced currently and processed barriers involved in overcoming these obstacles. Pt identified steps necessary for overcoming these obstacles and explored motivation (internal and external) for facing these difficulties head on. Pt further identified one area of concern in their lives and chose a goal to focus on for today.  Pt shared that his biggest obstacle right now is dealing with property that was left for him.  Pt discussed "wealth and power" which appeared unrelated to the group discussion and his obstacle.  Pt actively participated and was engaged in group discussion.    Regan Lemming, [place] 04/06/2013 2:18 PM

## 2013-04-06 NOTE — Progress Notes (Addendum)
Pt. Remains blunted and flat but does occasionally smile   Pt. States that he thinks he will be discharged on Wednesday.  Writer ask pt. If he is hearing voices and he denies A and VH.  Pt. Also denies SI and HI.  Pt. Does not bring up his family this pm but believe there are still issues with whether the pt. Will return to the home although he states that he will be returning home.  Pt. Is calm and cooperative, no complaints of pain or discomfort noted, pt. Again brings up being discharged and states that he feels he is ready.  Pt. Does request a sleep medication and ask about lasix.  Another pt. Had been talking about lasix and think this caused the pt. To think he is suppose to be getting it showing there is still some confusion noted.  Continue 15 checks to maintain pt. Safety.

## 2013-04-06 NOTE — Progress Notes (Signed)
[facility] MD Progress Note  04/06/2013 10:39 AM Ivan [name]  MRN:  [id number] Subjective: Patient complained of edema in his legs, positive for bilateral edema--HCTZ added and potassium adjusted.  Sleep "very well", appetite "good", no complaints of fatigue, 9.5/10 depression with anxiety but denies suicidal ideations and hallucinations.  [place] lives alone but surrounded by family:  Parents live across the street.  His mother started a group home that is across the street but his sister's manage it since his mother is now disabled on dialysis.  [place] is paranoid about family and employees at the group home trying to sabotage him, started about three weeks ago when he went into his garage and the garage door was "rigged" and fell on him; claims someone was there but didn't notice when he was there.  He continues to feel like someone is trying to sabotage him and contributes to his stress along with family issues.  Yashar states his grandparents had a lot of land and money; now family after it.  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  Spence denies stopping his medications prior to admission, stressors is what he contributes to this break.  Diagnosis:   Axis I: Anxiety Disorder NOS and Chronic Paranoid Schizophrenia Axis II: Deferred Axis III:  Past Medical History  Diagnosis Date  . Diabetes mellitus   . Hypertension   . Bipolar 1 disorder   . Schizoaffective disorder   . Hypokalemia 04/02/2013   Axis IV: other psychosocial or environmental problems, problems related to social environment and problems with primary support group Axis V: 31-40 impairment in reality testing  ADL's:  Intact  Sleep: Good  Appetite:  Good  Suicidal Ideation:  Denied Homicidal Ideation:  Denied  Psychiatric Specialty  Exam: Review of Systems  Constitutional: Negative.   HENT: Negative.   Eyes: Negative.   Respiratory: Negative.   Cardiovascular: Positive for leg swelling.  Gastrointestinal: Negative.   Genitourinary: Negative.   Musculoskeletal: Negative.   Skin: Negative.   Neurological: Negative.   Endo/Heme/Allergies: Negative.   Psychiatric/Behavioral: Positive for depression. The patient is nervous/anxious.     Blood pressure 142/95, pulse 112, temperature 97.2 F (36.2 C), temperature source Oral, resp. rate 20, height 6' (1.829 m).There is no weight on file to calculate BMI.  General Appearance: Casual  Eye Contact::  Fair  Speech:  Normal Rate  Volume:  Normal  Mood:  Anxious and Depressed  Affect:  Flat  Thought Process:  Coherent  Orientation:  Full (Time, Place, and Person)  Thought Content:  Delusions and Paranoid Ideation  Suicidal Thoughts:  No  Homicidal Thoughts:  No  Memory:  Immediate;   Fair Recent;   Fair Remote;   Fair  Judgement:  Impaired  Insight:  Lacking  Psychomotor Activity:  Decreased  Concentration:  Fair  Recall:  Fair  Akathisia:  No  Handed:  Right  AIMS (if indicated):     Assets:  Physical Health Resilience Social Support  Sleep:  Number of Hours: 5.75   Current Medications: Current Facility-Administered Medications  Medication Dose Route Frequency Provider Last Rate Last Dose  . acetaminophen (TYLENOL) tablet 650 mg  650 mg Oral Q6H PRN Laverle Hobby, PA-C   650 mg at 04/04/13 0330  . alum & mag hydroxide-simeth (MAALOX/MYLANTA) 200-200-20 MG/5ML suspension 30 mL  30 mL Oral Q4H PRN Laverle Hobby, PA-C      . amLODipine (NORVASC) tablet 10 mg  10 mg Oral Daily Eugenie Filler, MD   10 mg at 04/06/13 0759  . benztropine (COGENTIN) tablet 1 mg  1 mg Oral Daily Mojeed Akintayo   1 mg at 04/06/13 0759  . hydrALAZINE (APRESOLINE) tablet 100 mg  100 mg Oral TID Eugenie Filler, MD   100 mg at 04/06/13 0831  . hydrochlorothiazide  (HYDRODIURIL) tablet 25 mg  25 mg Oral Daily Waylan Boga, NP      . insulin aspart (novoLOG) injection 0-9 Units  0-9 Units Subcutaneous TID WC Elmarie Shiley, MD   2 Units at 04/05/13 1719  . magnesium hydroxide (MILK OF MAGNESIA) suspension 30 mL  30 mL Oral Daily PRN Laverle Hobby, PA-C      . magnesium oxide (MAG-OX) tablet 400 mg  400 mg Oral BID Eugenie Filler, MD   400 mg at 04/06/13 0759  . metoprolol tartrate (LOPRESSOR) tablet 25 mg  25 mg Oral BID Eugenie Filler, MD   25 mg at 04/06/13 [id number]  . nicotine polacrilex (NICORETTE) gum 2 mg  2 mg Oral PRN Mojeed Akintayo   2 mg at 04/06/13 0817  . paliperidone (INVEGA) 24 hr tablet 9 mg  9 mg Oral Daily Mojeed Akintayo   9 mg at 04/06/13 0759  . potassium chloride (K-DUR,KLOR-CON) CR tablet 10 mEq  10 mEq Oral BID Waylan Boga, NP      . traZODone (DESYREL) tablet 50 mg  50 mg Oral QHS,MR X 1 Laverle Hobby, PA-C   50 mg at 04/05/13 2055    Lab Results:  Results for orders placed during the hospital encounter of 03/31/13 (from the past 48 hour(s))  GLUCOSE, CAPILLARY     Status: Abnormal   Collection Time    04/04/13 12:03 PM      Result Value Range   Glucose-Capillary 155 (*) 70 - 99 mg/dL  GLUCOSE, CAPILLARY     Status: Abnormal   Collection Time    04/04/13  5:07 PM      Result Value Range   Glucose-Capillary 143 (*) 70 - 99 mg/dL  GLUCOSE, CAPILLARY     Status: Abnormal   Collection Time    04/04/13  8:48 PM      Result Value Range   Glucose-Capillary 153 (*) 70 - 99 mg/dL   Comment 1 Notify RN    GLUCOSE, CAPILLARY     Status: Abnormal   Collection Time    04/05/13  6:14 AM      Result Value Range   Glucose-Capillary 133 (*) 70 - 99 mg/dL   Comment 1 Notify RN    GLUCOSE, CAPILLARY     Status: Abnormal   Collection Time    04/05/13 12:03 PM      Result Value Range   Glucose-Capillary 144 (*) 70 - 99 mg/dL   Comment 1 Documented in Chart     Comment 2 Notify RN    GLUCOSE, CAPILLARY     Status: Abnormal    Collection Time    04/05/13  5:05 PM      Result Value Range   Glucose-Capillary 164 (*) 70 - 99 mg/dL   Comment 1 Documented in Chart     Comment 2 Notify RN    GLUCOSE, CAPILLARY     Status: Abnormal   Collection Time    04/05/13  8:20 PM  Result Value Range   Glucose-Capillary 172 (*) 70 - 99 mg/dL  GLUCOSE, CAPILLARY     Status: Abnormal   Collection Time    04/06/13  6:03 AM      Result Value Range   Glucose-Capillary 120 (*) 70 - 99 mg/dL    Physical Findings: AIMS: Facial and Oral Movements Muscles of Facial Expression: None, normal Lips and Perioral Area: None, normal Jaw: None, normal Tongue: None, normal,Extremity Movements Upper (arms, wrists, hands, fingers): None, normal Lower (legs, knees, ankles, toes): None, normal, Trunk Movements Neck, shoulders, hips: None, normal, Overall Severity Severity of abnormal movements (highest score from questions above): None, normal Incapacitation due to abnormal movements: None, normal Patient's awareness of abnormal movements (rate only patient's report): No Awareness, Dental Status Current problems with teeth and/or dentures?: No Does patient usually wear dentures?: No  CIWA:  CIWA-Ar Total: 0 COWS:  COWS Total Score: 0  Treatment Plan Summary: Daily contact with patient to assess and evaluate symptoms and progress in treatment Medication management  Plan:  Review of chart, vital signs, medications, and notes. 1-Individual and group therapy 2-Medication management for depression, paranoia/delusions, and anxiety:  Medications reviewed with the patient and HCTZ 25 mg started for edema and HTN, potassium 20 mEq daily changed to 10 mEq BID. 3-Coping skills for depression, anxiety, and paranoia/delusions 4-Continue crisis stabilization and management 5-Address health issues--monitoring POCT and vital signs, elevated BP--medications above added 6-Treatment plan in progress to prevent relapse of depression and  anxiety  Medical Decision Making Problem Points:  Established problem, stable/improving (1) and Review of psycho-social stressors (1) Data Points:  Review of new medications or change in dosage (2)  I certify that inpatient services furnished can reasonably be expected to improve the patient's condition.   Waylan Boga, [place] 04/06/2013, 10:39 AM

## 2013-04-07 ENCOUNTER — Encounter (HOSPITAL_COMMUNITY): Payer: Self-pay | Admitting: Internal Medicine

## 2013-04-07 DIAGNOSIS — R600 Localized edema: Secondary | ICD-10-CM

## 2013-04-07 HISTORY — DX: Localized edema: R60.0

## 2013-04-07 LAB — GLUCOSE, CAPILLARY
Glucose-Capillary: 140 mg/dL — ABNORMAL HIGH (ref 70–99)
Glucose-Capillary: 165 mg/dL — ABNORMAL HIGH (ref 70–99)

## 2013-04-07 MED ORDER — GLIPIZIDE 5 MG PO TABS
5.0000 mg | ORAL_TABLET | Freq: Every day | ORAL | Status: DC
  Administered 2013-04-08 – 2013-04-09 (×2): 5 mg via ORAL
  Filled 2013-04-07 (×6): qty 1

## 2013-04-07 MED ORDER — HYDRALAZINE HCL 50 MG PO TABS
100.0000 mg | ORAL_TABLET | Freq: Four times a day (QID) | ORAL | Status: DC
  Administered 2013-04-07 – 2013-04-08 (×4): 100 mg via ORAL
  Filled 2013-04-07 (×11): qty 2

## 2013-04-07 MED ORDER — LABETALOL HCL 200 MG PO TABS
200.0000 mg | ORAL_TABLET | Freq: Two times a day (BID) | ORAL | Status: DC
  Administered 2013-04-07 – 2013-04-09 (×4): 200 mg via ORAL
  Filled 2013-04-07: qty 1
  Filled 2013-04-07: qty 2
  Filled 2013-04-07 (×6): qty 1
  Filled 2013-04-07: qty 2
  Filled 2013-04-07: qty 1

## 2013-04-07 MED ORDER — CLONIDINE HCL 0.1 MG PO TABS
0.1000 mg | ORAL_TABLET | Freq: Two times a day (BID) | ORAL | Status: DC
  Administered 2013-04-07 – 2013-04-09 (×4): 0.1 mg via ORAL
  Filled 2013-04-07 (×4): qty 1
  Filled 2013-04-07: qty 4
  Filled 2013-04-07: qty 1
  Filled 2013-04-07: qty 4
  Filled 2013-04-07: qty 1
  Filled 2013-04-07: qty 4
  Filled 2013-04-07: qty 1
  Filled 2013-04-07: qty 4

## 2013-04-07 MED ORDER — FUROSEMIDE 40 MG PO TABS
40.0000 mg | ORAL_TABLET | Freq: Two times a day (BID) | ORAL | Status: DC
  Administered 2013-04-07 – 2013-04-09 (×4): 40 mg via ORAL
  Filled 2013-04-07 (×12): qty 1

## 2013-04-07 MED ORDER — CLONIDINE HCL ER 0.1 MG PO TB12
0.1000 mg | ORAL_TABLET | Freq: Two times a day (BID) | ORAL | Status: DC
  Filled 2013-04-07 (×2): qty 1

## 2013-04-07 NOTE — Progress Notes (Addendum)
[facility] PROGRESS NOTE  Ivan [name] [id number] DOB: 29-Oct-1960 DOA: 03/31/2013 PCP: Cleda Mccreedy, MD  Assessment/Plan: #1 Well controlled diabetes mellitus Hemoglobin A1c 7.1. CBGs have ranged from 140-169. Patient with chronic kidney disease and creatinine approximately at 1.5. Would recommend discontinuing metformin and not resuming it on discharge. Will start patient on glipizide 5 mg daily and a prescription will need to be made on discharge. Continue sliding scale insulin for now.  #2 hypertension Systolic blood pressures elevated. Callled and spoke with the pharmacist that [facility] in [place], [place] and was told that patient was on Lasix 40 mg twice daily, hydralazine 100 mg 4 times a day, Norvasc 5 mg twice a day, labetalol 200 mg twice a day and clonidine 0.1 mg twice a day. Will change Lopressor to patient's home dose of labetalol, increase hydralazine to 4 times daily, discontinue HCTZ. Resume home dose of Lasix 40 mg twice daily. Resume clonidine 0.1 mg twice daily. Follow.  #4 hypokalemia Patient with a history of hypokalemia on chronic potassium supplementation. Repleted potassium. Continue home regimen of 20 mEq of KCl daily. Repeat BMET in AM.  #5 tachycardia EKG with sinus tachycardia resolved.  #6 bilateral chronic lower extremity edema Patient states has chronic lower extremity edema and is on a diuretic for it. Called patient's pharmacy and patient is indeed on Lasix 40 mg twice daily. Patient denies any shortness of breath. Patient denies any chest pain. Follow. Check BMET in AM.  #6 chronic kidney disease stage III. Stable. Follow.  #7 chronic paranoid schizophrenia Per primary team.   Will F/U TOMORROW.  Code Status: Full Family Communication: updated patient no family at bedside. Disposition Plan: Per primary team   Consultants:  None  Procedures:  None  Antibiotics:  None  HPI/Subjective: Patient states has had LE edema and was  on a water pill but cannot remember what it is. No SOB, no CP.  Objective: Filed Vitals:   04/07/13 1145  BP: 157/85  Pulse: 70  Temp:   Resp:    No intake or output data in the 24 hours ending 04/07/13 1526 There were no vitals filed for this visit.  Exam:   General:   NAD  Cardiovascular: RRR. 2-3+ BLE edema  Respiratory: CTAB  Abdomen: Soft/NT/OBESE/+BS  Musculoskeletal: 5/5 BUE strength, 5/5 BLE strength   Data Reviewed: Basic Metabolic Panel:  Recent Labs Lab 04/01/13 0610 04/02/13 0620 04/02/13 1945 04/03/13 0615  NA 137 138 140 139  K 3.2* 3.4* 3.5 3.5  CL 101 104 104 106  CO2 27 27 28 24   GLUCOSE 119* 128* 151* 140*  BUN 19 19 18 14   CREATININE 1.50* 1.49* 1.50* 1.49*  CALCIUM 10.1 9.5 9.6 9.4  MG  --   --  1.5  --    Liver Function Tests:  Recent Labs Lab 04/01/13 0610  AST 26  ALT 28  ALKPHOS 100  BILITOT 0.2*  PROT 7.2  ALBUMIN 3.1*   No results found for this basename: LIPASE, AMYLASE,  in the last 168 hours No results found for this basename: AMMONIA,  in the last 168 hours CBC: No results found for this basename: WBC, NEUTROABS, HGB, HCT, MCV, PLT,  in the last 168 hours Cardiac Enzymes: No results found for this basename: CKTOTAL, CKMB, CKMBINDEX, TROPONINI,  in the last 168 hours BNP (last 3 results) No results found for this basename: PROBNP,  in the last 8760 hours CBG:  Recent Labs Lab 04/06/13 1210 04/06/13 1653 04/06/13  2059 04/07/13 0533 04/07/13 1151  GLUCAP 164* 158* 169* 140* 165*    No results found for this or any previous visit (from the past 240 hour(s)).   Studies: No results found.  Scheduled Meds: . amLODipine  10 mg Oral Daily  . benztropine  1 mg Oral Daily  . hydrALAZINE  100 mg Oral TID  . hydrochlorothiazide  25 mg Oral Daily  . insulin aspart  0-9 Units Subcutaneous TID WC  . magnesium oxide  400 mg Oral BID  . metoprolol tartrate  25 mg Oral BID  . paliperidone  9 mg Oral Daily  .  potassium chloride  10 mEq Oral BID  . traZODone  50 mg Oral QHS,MR X 1   Continuous Infusions:   Principal Problem:   Paranoid schizophrenia, chronic condition Active Problems:   Diabetes   Essential hypertension, benign   Hypokalemia   Edema of both legs    Time spent: 30 mins    [facility] Pager [phone]. If 7PM-7AM, please contact night-coverage at [email or web address], password [facility] 04/07/2013, 3:26 PM  LOS: 7 days

## 2013-04-07 NOTE — [facility] Group Notes (Signed)
Adult Psychoeducational Group Note  Date:  04/07/2013 Time:  9:28 PM  Group Topic/Focus:  Wrap-Up Group:   The focus of this group is to help patients review their daily goal of treatment and discuss progress on daily workbooks.  Participation Level:  Minimal  Participation Quality:  Appropriate  Affect:  Flat  Cognitive:  Appropriate  Insight: Appropriate  Engagement in Group:  Limited  Modes of Intervention:  Discussion  Additional Comments:  Ivan [name] expressed that he had a good day and that he met with his doctor and nurses.  Ivan [name] A 04/07/2013, 9:28 PM

## 2013-04-07 NOTE — Progress Notes (Signed)
Patient ID: Ivan [name], male   DOB: 01-Aug-1961, 52 y.o.   MRN: [id number] D: Patient has brighter affect this morning; however, he remains depressed and has feelings of hopelessness.  Patient has no specific plan upon discharge as to where he will leave.  He stated that he bought out the "family homestead" and has relatives that live all around him.  Due to the conflict with them, he does not plan to return.  He denies any HI/SI/AVH.  Patient has been compliant with his medications and has been attending groups. A: Continue to monitor medication management and MD orders.  Safety checks completed every 15 minutes per protocol. R: Patient's behavior has been appropriate.

## 2013-04-07 NOTE — Progress Notes (Signed)
D: Patient continues to state that family conflict is a major stressor for him but states, "that'll never change" as conflict has been ongoing for "many years". Reports he will live alone after discharge and not go back to family, but declined to state where specifically he's going. Somewhat guarded and anxious when speaking with this writer, avoiding eye contact and curt with responses. Does state he is ready to discharge and is hopeful he can leave tomorrow. A: Medications given as ordered. Support and encouragement offered. Encouraged group attendance/participation. Maintained Q15 min checks for safety. R: Denies SI/HI, AVH. Voiced no c/o pain or physical discomfort. Behavior has been cooperative.

## 2013-04-07 NOTE — Progress Notes (Signed)
Pt. Is in the milieu with minimal interaction with his peers and staff.  Pt. Does answer questions.  Writer ask pt. About his day he said "Well it was just a day. It was okay".  Pt. Denies SI and HI, no complaints of pain or discomfort noted, pt. Denies A and or VH.  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  Writer ask pt. If he has any concerns about discharging and pt. Says "No:.  Writer ask pt. Where he will be discharging to and he states he will be going back home.  Continue 15 min. Cks. To maintain pt. Safety.

## 2013-04-07 NOTE — Progress Notes (Signed)
Patient ID: Ivan [name], male   DOB: 07-23-1961, 52 y.o.   MRN: [id number] [facility] MD Progress Note  04/07/2013 10:29 AM PENN ALKINS  MRN:  [id number] Subjective: " I am still upset with my family, now I have nowhere to live when I am discharged." Objective: Patient reports that he is still upset with his family, reports decreased depressive symptoms, anxiety but remains paranoid. He has a fixed delusions that his family are against him and they have planted a lot of camera in his house to spy on him. He is currently homeless because he has moved out of the house giving to him by his family; " I am tired of being spied on." Patient is compliant with his medications and has not verbalized any adverse reactions. Diagnosis:   Axis I: Chronic Paranoid Schizophrenia Axis II: Deferred Axis III:  Past Medical History  Diagnosis Date  . Diabetes mellitus   . Hypertension   . Hypokalemia 04/02/2013   Axis IV: other psychosocial or environmental problems, problems related to social environment and problems with primary support group Axis V: 31-40 impairment in reality testing  ADL's:  Intact  Sleep: Good  Appetite:  Good  Suicidal Ideation:  Denied Homicidal Ideation:  Denied  Psychiatric Specialty Exam: Review of Systems  Constitutional: Negative.   HENT: Negative.   Eyes: Negative.   Respiratory: Negative.   Cardiovascular: Positive for leg swelling.  Gastrointestinal: Negative.   Genitourinary: Negative.   Musculoskeletal: Negative.   Skin: Negative.   Neurological: Negative.   Endo/Heme/Allergies: Negative.   Psychiatric/Behavioral: Positive for depression. The patient is nervous/anxious.     Blood pressure 144/83, pulse 89, temperature 98 F (36.7 C), temperature source Oral, resp. rate 20, height 6' (1.829 m).There is no weight on file to calculate BMI.  General Appearance: Casual  Eye Contact::  Fair  Speech:  Normal Rate  Volume:  Normal  Mood:  Anxious and Depressed   Affect:  Flat  Thought Process:  Coherent  Orientation:  Full (Time, Place, and Person)  Thought Content:  Delusions and Paranoid Ideation  Suicidal Thoughts:  No  Homicidal Thoughts:  No  Memory:  Immediate;   Fair Recent;   Fair Remote;   Fair  Judgement:  Impaired  Insight:  Lacking  Psychomotor Activity:  Decreased  Concentration:  Fair  Recall:  Fair  Akathisia:  No  Handed:  Right  AIMS (if indicated):     Assets:  Physical Health Resilience Social Support  Sleep:  Number of Hours: 3   Current Medications: Current Facility-Administered Medications  Medication Dose Route Frequency Provider Last Rate Last Dose  . acetaminophen (TYLENOL) tablet 650 mg  650 mg Oral Q6H PRN Laverle Hobby, PA-C   650 mg at 04/04/13 0330  . alum & mag hydroxide-simeth (MAALOX/MYLANTA) 200-200-20 MG/5ML suspension 30 mL  30 mL Oral Q4H PRN Laverle Hobby, PA-C      . amLODipine (NORVASC) tablet 10 mg  10 mg Oral Daily Eugenie Filler, MD   10 mg at 04/07/13 0758  . benztropine (COGENTIN) tablet 1 mg  1 mg Oral Daily Zohal Reny   1 mg at 04/07/13 0758  . hydrALAZINE (APRESOLINE) tablet 100 mg  100 mg Oral TID Eugenie Filler, MD   100 mg at 04/07/13 0758  . hydrochlorothiazide (HYDRODIURIL) tablet 25 mg  25 mg Oral Daily Waylan Boga, NP   25 mg at 04/07/13 0814  . insulin aspart (novoLOG) injection 0-9 Units  0-9  Units Subcutaneous TID WC Elmarie Shiley, MD   1 Units at 04/07/13 [phone]  . magnesium hydroxide (MILK OF MAGNESIA) suspension 30 mL  30 mL Oral Daily PRN Laverle Hobby, PA-C      . magnesium oxide (MAG-OX) tablet 400 mg  400 mg Oral BID Eugenie Filler, MD   400 mg at 04/07/13 0758  . metoprolol tartrate (LOPRESSOR) tablet 25 mg  25 mg Oral BID Eugenie Filler, MD   25 mg at 04/07/13 0758  . nicotine polacrilex (NICORETTE) gum 2 mg  2 mg Oral PRN Cienna Dumais   2 mg at 04/07/13 0949  . paliperidone (INVEGA) 24 hr tablet 9 mg  9 mg Oral Daily Kenyanna Grzesiak   9 mg  at 04/07/13 0758  . potassium chloride (K-DUR,KLOR-CON) CR tablet 10 mEq  10 mEq Oral BID Waylan Boga, NP   10 mEq at 04/07/13 0758  . traZODone (DESYREL) tablet 50 mg  50 mg Oral QHS,MR X 1 Laverle Hobby, PA-C   50 mg at 04/06/13 2143    Lab Results:  Results for orders placed during the hospital encounter of 03/31/13 (from the past 48 hour(s))  GLUCOSE, CAPILLARY     Status: Abnormal   Collection Time    04/05/13 12:03 PM      Result Value Range   Glucose-Capillary 144 (*) 70 - 99 mg/dL   Comment 1 Documented in Chart     Comment 2 Notify RN    GLUCOSE, CAPILLARY     Status: Abnormal   Collection Time    04/05/13  5:05 PM      Result Value Range   Glucose-Capillary 164 (*) 70 - 99 mg/dL   Comment 1 Documented in Chart     Comment 2 Notify RN    GLUCOSE, CAPILLARY     Status: Abnormal   Collection Time    04/05/13  8:20 PM      Result Value Range   Glucose-Capillary 172 (*) 70 - 99 mg/dL  GLUCOSE, CAPILLARY     Status: Abnormal   Collection Time    04/06/13  6:03 AM      Result Value Range   Glucose-Capillary 120 (*) 70 - 99 mg/dL  GLUCOSE, CAPILLARY     Status: Abnormal   Collection Time    04/06/13 12:10 PM      Result Value Range   Glucose-Capillary 164 (*) 70 - 99 mg/dL   Comment 1 Notify RN    GLUCOSE, CAPILLARY     Status: Abnormal   Collection Time    04/06/13  4:53 PM      Result Value Range   Glucose-Capillary 158 (*) 70 - 99 mg/dL   Comment 1 Documented in Chart     Comment 2 Notify RN    GLUCOSE, CAPILLARY     Status: Abnormal   Collection Time    04/06/13  8:59 PM      Result Value Range   Glucose-Capillary 169 (*) 70 - 99 mg/dL   Comment 1 Notify RN    GLUCOSE, CAPILLARY     Status: Abnormal   Collection Time    04/07/13  5:33 AM      Result Value Range   Glucose-Capillary 140 (*) 70 - 99 mg/dL   Comment 1 Notify RN      Physical Findings: AIMS: Facial and Oral Movements Muscles of Facial Expression: None, normal Lips and Perioral Area:  None, normal Jaw: None, normal Tongue: None,  normal,Extremity Movements Upper (arms, wrists, hands, fingers): None, normal Lower (legs, knees, ankles, toes): None, normal, Trunk Movements Neck, shoulders, hips: None, normal, Overall Severity Severity of abnormal movements (highest score from questions above): None, normal Incapacitation due to abnormal movements: None, normal Patient's awareness of abnormal movements (rate only patient's report): No Awareness, Dental Status Current problems with teeth and/or dentures?: No Does patient usually wear dentures?: No  CIWA:  CIWA-Ar Total: 0 COWS:  COWS Total Score: 0  Treatment Plan Summary: Daily contact with patient to assess and evaluate symptoms and progress in treatment Medication management  Plan:  Review of chart, vital signs, medications, and notes. 1-Individual and group therapy 2-Medication management for depression, paranoia/delusions, and anxiety:  Medications reviewed with the patient and HCTZ 25 mg started for edema and HTN, potassium 20 mEq daily changed to 10 mEq BID. 3-Coping skills for depression, anxiety, and paranoia/delusions 4-Continue crisis stabilization and management 5-Address health issues--monitoring POCT and vital signs, elevated BP--medications above added 6-Treatment plan in progress to prevent relapse of depression and anxiety 7- Will continue patient on current medication regimen Medical Decision Making Problem Points:  Established problem, improving (1) and Review of psycho-social stressors (1) Data Points:  Review of new medications or change in dosage (2)  I certify that inpatient services furnished can reasonably be expected to improve the patient's condition.   Corena Pilgrim, MD 04/07/2013, 10:29 AM

## 2013-04-07 NOTE — [facility] Suicide Risk Assessment (Signed)
[facility] INPATIENT:  Family/Significant Other Suicide Prevention Education  Suicide Prevention Education:  Contact Attempts: Ivan [name] (pt's sister) has been identified by the patient as the family member/significant other with whom the patient will be residing, and identified as the person(s) who will aid the patient in the event of a mental health crisis.  With written consent from the patient, two attempts were made to provide suicide prevention education, prior to and/or following the patient's discharge.  We were unsuccessful in providing suicide prevention education.  A suicide education pamphlet was given to the patient to share with family/significant other.  Date and time of first attempt: 11:45AM 04/07/13 Date and time of second attempt: 3:20PM 04/07/13 (voicemail is full and unable to leave message)  [name], Ivan Conn 04/07/2013, 3:20 PM

## 2013-04-07 NOTE — BHH Group Notes (Signed)
[place] LCSW Group Therapy  04/07/2013  1:15 PM   Type of Therapy:  Group Therapy  Participation Level:  Active  Participation Quality:  Appropriate and Attentive  Affect:  Appropriate and Flat  Cognitive:  Alert and Appropriate  Insight:  Developing/Improving and Engaged  Engagement in Therapy:  Developing/Improving and Engaged  Modes of Intervention:  Clarification, Confrontation, Discussion, Education, Exploration, Limit-setting, Orientation, Problem-solving, Rapport Building, Art therapist, Socialization and Support  Summary of Progress/Problems: The topic for group therapy was feelings about diagnosis.  Pt actively participated in group discussion on their past and current diagnosis and how they feel towards this.  Pt also identified how society and family members judge them, based on their diagnosis as well as stereotypes and stigmas.   Pt shared that he has been diagnosed with schizophrenia and feels labeled.  Pt was able to process how it feels to be labeled and judged.  Pt actively participated and was engaged in group discussion.    Regan Lemming, [place] 04/07/2013 2:10 PM

## 2013-04-07 NOTE — Clinical Social Work Note (Signed)
CSW met with pt this morning. Pt presents with flat affect. He provided CSW with phone number for sister and signed ROI. Pt reported that he "feels all right" but did not elaborate.

## 2013-04-08 DIAGNOSIS — R609 Edema, unspecified: Secondary | ICD-10-CM

## 2013-04-08 LAB — BASIC METABOLIC PANEL
BUN: 17 mg/dL (ref 6–23)
CO2: 27 mEq/L (ref 19–32)
Glucose, Bld: 138 mg/dL — ABNORMAL HIGH (ref 70–99)
Potassium: 3.3 mEq/L — ABNORMAL LOW (ref 3.5–5.1)
Sodium: 137 mEq/L (ref 135–145)

## 2013-04-08 LAB — GLUCOSE, CAPILLARY: Glucose-Capillary: 135 mg/dL — ABNORMAL HIGH (ref 70–99)

## 2013-04-08 MED ORDER — HYDRALAZINE HCL 100 MG PO TABS: 100.0000 mg | ORAL_TABLET | Freq: Three times a day (TID) | ORAL | Status: DC

## 2013-04-08 MED ORDER — GLIPIZIDE 5 MG PO TABS: 5.0000 mg | ORAL_TABLET | Freq: Every day | ORAL | Status: DC

## 2013-04-08 MED ORDER — POTASSIUM CHLORIDE CRYS ER 20 MEQ PO TBCR
40.0000 meq | EXTENDED_RELEASE_TABLET | Freq: Once | ORAL | Status: AC
  Administered 2013-04-08: 40 meq via ORAL
  Filled 2013-04-08: qty 1
  Filled 2013-04-08: qty 2

## 2013-04-08 MED ORDER — HYDRALAZINE HCL 50 MG PO TABS
100.0000 mg | ORAL_TABLET | Freq: Three times a day (TID) | ORAL | Status: DC
  Administered 2013-04-08 – 2013-04-09 (×3): 100 mg via ORAL
  Filled 2013-04-08 (×8): qty 2

## 2013-04-08 NOTE — Progress Notes (Signed)
Recreation Therapy Notes  Date: [phone] Time: 9:30am Location: [address] Dayroom  Group Topic: Leisure Education  Goal Area(s) Addresses:  Patient will verbalize understanding of benefits of leisure. Patient will identify positive activities that can be used during leisure/recreation time.   Behavioral Response: Engaged, Appropriate, Drowsy  Intervention: Adapted Game  Activity: Leisure ABC's. LRT wrote alphabet on the white board in the day room. Patients were asked to identify activities that corresponded with letters of the alphabet.   Education:  Radiographer, therapeutic, Discharge Planning  Education Outcome: Acknowledges understanding  Clinical Observations/Feedback: Patient participated in group activity, stating appropriate leisure/recreation activity to correspond with letters of the alphabet. Patient appeared very sleepy, as he often dosed off during session. Patient eyes appeared very heavy and patient was much less involved in group discussions than he had been in previous recreation therapy group sessions.   Laureen Ochs Bayle Calvo, LRT/CTRS  Lane Hacker 04/08/2013 12:56 PM

## 2013-04-08 NOTE — Progress Notes (Signed)
[facility] PROGRESS NOTE  Ivan [name] [id number] DOB: [birth year] DOA: 03/31/2013 PCP: Cleda Mccreedy, MD  Assessment/Plan:  #1 Well controlled diabetes mellitus  Hemoglobin A1c 7.1. CBGs have ranged from 103-224. Patient with chronic kidney disease and creatinine approximately at 1.5. Would recommend discontinuing metformin and not resuming it on discharge.Recommend continuing  patient on glipizide 5 mg daily and a prescription will need to be made on discharge. Continue sliding scale insulin for now.   - Med rec complete  #2 hypertension  Systolic blood pressures elevated. Callled and spoke with the pharmacist that [facility] in [place], [place] and was told that patient was on Lasix 40 mg twice daily, hydralazine 100 mg 4 times a day, Norvasc 5 mg twice a day, labetalol 200 mg twice a day and clonidine 0.1 mg twice a day. Will change Lopressor to patient's home dose of labetalol,change hydralazine to 3 times daily (recommended max dose), discontinue HCTZ. Resume home dose of Lasix 40 mg twice daily. Resume clonidine 0.1 mg twice daily. Follow.   - Med rec complete  #4 hypokalemia  Patient with a history of hypokalemia on chronic potassium supplementation. Repleted potassium orally. Would recommend rechecking next am.  #5 tachycardia  Resolved  #6 bilateral chronic lower extremity edema  - Continue home lasix dose and recommended elevating lower extremities.  #6 chronic kidney disease stage III.  Stable. Follow.   #7 chronic paranoid schizophrenia  Per primary team.   Will sign off please reconsult or call with any questions.  Code Status: Full  Family Communication: updated patient no family at bedside.  Disposition Plan: Per primary team   Procedures:  none  Antibiotics:  none  HPI/Subjective: No new complaints. No acute issues overnight.  Still complaining of lower extremity edema.  Objective: Filed Vitals:   04/08/13 1119  BP: 136/74  Pulse: 73   Temp:   Resp:    No intake or output data in the 24 hours ending 04/08/13 1407 There were no vitals filed for this visit.  Exam:   General:  Pt in NAD, Alert and Awake  Cardiovascular: RRR, no MRG  Respiratory: CTA BL, no wheezes  Abdomen: soft, NT, ND  Musculoskeletal: no cyanosis or clubbing  Skin: LE BL edema   Data Reviewed: Basic Metabolic Panel:  Recent Labs Lab 04/02/13 0620 04/02/13 1945 04/03/13 0615 04/08/13 0620  NA 138 140 139 137  K 3.4* 3.5 3.5 3.3*  CL 104 104 106 102  CO2 27 28 24 27   GLUCOSE 128* 151* 140* 138*  BUN 19 18 14 17   CREATININE 1.49* 1.50* 1.49* 1.50*  CALCIUM 9.5 9.6 9.4 9.0  MG  --  1.5  --   --    Liver Function Tests: No results found for this basename: AST, ALT, ALKPHOS, BILITOT, PROT, ALBUMIN,  in the last 168 hours No results found for this basename: LIPASE, AMYLASE,  in the last 168 hours No results found for this basename: AMMONIA,  in the last 168 hours CBC: No results found for this basename: WBC, NEUTROABS, HGB, HCT, MCV, PLT,  in the last 168 hours Cardiac Enzymes: No results found for this basename: CKTOTAL, CKMB, CKMBINDEX, TROPONINI,  in the last 168 hours BNP (last 3 results) No results found for this basename: PROBNP,  in the last 8760 hours CBG:  Recent Labs Lab 04/07/13 1151 04/07/13 1645 04/07/13 2143 04/08/13 0610 04/08/13 1138  GLUCAP 165* 177* 224* 135* 103*    No results found for this or  any previous visit (from the past 240 hour(s)).   Studies: No results found.  Scheduled Meds: . amLODipine  10 mg Oral Daily  . benztropine  1 mg Oral Daily  . cloNIDine  0.1 mg Oral BID  . furosemide  40 mg Oral BID  . glipiZIDE  5 mg Oral QAC breakfast  . hydrALAZINE  100 mg Oral QID  . insulin aspart  0-9 Units Subcutaneous TID WC  . labetalol  200 mg Oral BID  . magnesium oxide  400 mg Oral BID  . paliperidone  9 mg Oral Daily  . potassium chloride  10 mEq Oral BID  . potassium chloride  40 mEq  Oral Once  . traZODone  50 mg Oral QHS,MR X 1   Continuous Infusions:   Principal Problem:   Paranoid schizophrenia, chronic condition Active Problems:   Diabetes   Essential hypertension, benign   Hypokalemia   Edema of both legs    Time spent: > 35 minutes    Velvet Bathe  [facility] Pager [phone]. If 7PM-7AM, please contact night-coverage at [email or web address], password [facility] 04/08/2013, 2:07 PM  LOS: 8 days

## 2013-04-08 NOTE — BHH Group Notes (Signed)
[facility] LCSW Aftercare Discharge Planning Group Note   04/08/2013 8:03 AM  Participation Quality:  Minimal  Mood/Affect:  Flat  Depression Rating:  denies  Anxiety Rating:  denies  Thoughts of Suicide:  No Will you contract for safety?   NA  Current AVH:  No  Plan for Discharge/Comments:  States he is ready to go.  Cannot stay at home because of overdue bills, but knows that staying in a hotel will drain his funds quickly.  I let him know I would follow up with ACT team today to make sure they will be able to follow up with him.  Transportation [employer]: sheriff  Supports:  none   [place], [place] B

## 2013-04-08 NOTE — Progress Notes (Signed)
Seen and agreed. Arabia Nylund, MD 

## 2013-04-08 NOTE — Progress Notes (Signed)
Patient ID: Ivan [name], male   DOB: 1961/01/21, 52 y.o.   MRN: [id number]  [facility] MD Progress Note  04/08/2013 2:49 PM Ivan [name]  MRN:  [id number] Subjective:  Patient is very guarded today and forwards very little information to writer stating "I feel good. I'm ready to go." When asked about his family stated "They will always cause me problems. I had trouble with them since I was 7 and that will never change."  Objective: Patient reports that he is still upset with his family, reports decreased depressive symptoms, anxiety but remains paranoid. He has a fixed delusions that his family are against him and they have planted a lot of camera in his house to spy on him.  Patient is compliant with his medications and has not verbalized any adverse reactions. Diagnosis:   Axis I: Chronic Paranoid Schizophrenia Axis II: Deferred Axis III:  Past Medical History  Diagnosis Date  . Diabetes mellitus   . Hypertension   . Hypokalemia 04/02/2013   Axis IV: other psychosocial or environmental problems, problems related to social environment and problems with primary support group Axis V: 31-40 impairment in reality testing  ADL's:  Intact  Sleep: Good  Appetite:  Good  Suicidal Ideation:  Denied Homicidal Ideation:  Denied  Psychiatric Specialty Exam: Review of Systems  Constitutional: Negative.  Negative for fever, chills, weight loss and malaise/fatigue.  HENT: Negative.  Negative for hearing loss and neck pain.   Eyes: Negative.  Negative for blurred vision, double vision, photophobia and pain.  Respiratory: Negative.  Negative for cough, hemoptysis and sputum production.   Cardiovascular: Negative for chest pain, palpitations and orthopnea.  Gastrointestinal: Negative.  Negative for heartburn, nausea, vomiting, abdominal pain and diarrhea.  Genitourinary: Negative.  Negative for dysuria, urgency and frequency.  Musculoskeletal: Negative.  Negative for myalgias and back pain.   Skin: Negative.  Negative for itching and rash.  Neurological: Negative.  Negative for dizziness, tingling, tremors and headaches.  Endo/Heme/Allergies: Negative.  Negative for environmental allergies. Does not bruise/bleed easily.  Psychiatric/Behavioral: Positive for depression. The patient is nervous/anxious.     Blood pressure 136/74, pulse 73, temperature 97.9 F (36.6 C), temperature source Oral, resp. rate 18, height 6' (1.829 m).There is no weight on file to calculate BMI.  General Appearance: Casual  Eye Contact::  Fair  Speech:  Normal Rate  Volume:  Normal  Mood:  Anxious and Depressed  Affect:  Flat  Thought Process:  Coherent  Orientation:  Full (Time, Place, and Person)  Thought Content:  Delusions and Paranoid Ideation  Suicidal Thoughts:  No  Homicidal Thoughts:  No  Memory:  Immediate;   Fair Recent;   Fair Remote;   Fair  Judgement:  Impaired  Insight:  Lacking  Psychomotor Activity:  Decreased  Concentration:  Fair  Recall:  Fair  Akathisia:  No  Handed:  Right  AIMS (if indicated):     Assets:  Physical Health Resilience Social Support  Sleep:  Number of Hours: 5.25   Current Medications: Current Facility-Administered Medications  Medication Dose Route Frequency Provider Last Rate Last Dose  . acetaminophen (TYLENOL) tablet 650 mg  650 mg Oral Q6H PRN Laverle Hobby, PA-C   650 mg at 04/04/13 0330  . alum & mag hydroxide-simeth (MAALOX/MYLANTA) 200-200-20 MG/5ML suspension 30 mL  30 mL Oral Q4H PRN Laverle Hobby, PA-C      . amLODipine (NORVASC) tablet 10 mg  10 mg Oral Daily Eugenie Filler, MD  10 mg at 04/08/13 0754  . benztropine (COGENTIN) tablet 1 mg  1 mg Oral Daily Mojeed Akintayo   1 mg at 04/08/13 0754  . cloNIDine (CATAPRES) tablet 0.1 mg  0.1 mg Oral BID Eugenie Filler, MD   0.1 mg at 04/08/13 0754  . furosemide (LASIX) tablet 40 mg  40 mg Oral BID Eugenie Filler, MD   40 mg at 04/08/13 0754  . glipiZIDE (GLUCOTROL) tablet 5 mg   5 mg Oral QAC breakfast Eugenie Filler, MD   5 mg at 04/08/13 [phone]  . hydrALAZINE (APRESOLINE) tablet 100 mg  100 mg Oral Q8H Velvet Bathe, MD      . insulin aspart (novoLOG) injection 0-9 Units  0-9 Units Subcutaneous TID WC Belkys A Regalado, MD   1 Units at 04/08/13 [id number]  . labetalol (NORMODYNE) tablet 200 mg  200 mg Oral BID Eugenie Filler, MD   200 mg at 04/08/13 0754  . magnesium hydroxide (MILK OF MAGNESIA) suspension 30 mL  30 mL Oral Daily PRN Laverle Hobby, PA-C      . magnesium oxide (MAG-OX) tablet 400 mg  400 mg Oral BID Eugenie Filler, MD   400 mg at 04/08/13 0755  . nicotine polacrilex (NICORETTE) gum 2 mg  2 mg Oral PRN Mojeed Akintayo   2 mg at 04/08/13 1001  . paliperidone (INVEGA) 24 hr tablet 9 mg  9 mg Oral Daily Mojeed Akintayo   9 mg at 04/08/13 0754  . potassium chloride (K-DUR,KLOR-CON) CR tablet 10 mEq  10 mEq Oral BID Waylan Boga, NP   10 mEq at 04/08/13 0754  . potassium chloride SA (K-DUR,KLOR-CON) CR tablet 40 mEq  40 mEq Oral Once Velvet Bathe, MD      . traZODone (DESYREL) tablet 50 mg  50 mg Oral QHS,MR X 1 Laverle Hobby, PA-C   50 mg at 04/07/13 2154    Lab Results:  Results for orders placed during the hospital encounter of 03/31/13 (from the past 48 hour(s))  GLUCOSE, CAPILLARY     Status: Abnormal   Collection Time    04/06/13  4:53 PM      Result Value Range   Glucose-Capillary 158 (*) 70 - 99 mg/dL   Comment 1 Documented in Chart     Comment 2 Notify RN    GLUCOSE, CAPILLARY     Status: Abnormal   Collection Time    04/06/13  8:59 PM      Result Value Range   Glucose-Capillary 169 (*) 70 - 99 mg/dL   Comment 1 Notify RN    GLUCOSE, CAPILLARY     Status: Abnormal   Collection Time    04/07/13  5:33 AM      Result Value Range   Glucose-Capillary 140 (*) 70 - 99 mg/dL   Comment 1 Notify RN    GLUCOSE, CAPILLARY     Status: Abnormal   Collection Time    04/07/13 11:51 AM      Result Value Range   Glucose-Capillary 165 (*) 70 - 99  mg/dL   Comment 1 Documented in Chart     Comment 2 Notify RN    GLUCOSE, CAPILLARY     Status: Abnormal   Collection Time    04/07/13  4:45 PM      Result Value Range   Glucose-Capillary 177 (*) 70 - 99 mg/dL  GLUCOSE, CAPILLARY     Status: Abnormal   Collection Time  04/07/13  9:43 PM      Result Value Range   Glucose-Capillary 224 (*) 70 - 99 mg/dL   Comment 1 Notify RN    GLUCOSE, CAPILLARY     Status: Abnormal   Collection Time    04/08/13  6:10 AM      Result Value Range   Glucose-Capillary 135 (*) 70 - 99 mg/dL  BASIC METABOLIC PANEL     Status: Abnormal   Collection Time    04/08/13  6:20 AM      Result Value Range   Sodium 137  135 - 145 mEq/L   Potassium 3.3 (*) 3.5 - 5.1 mEq/L   Chloride 102  96 - 112 mEq/L   CO2 27  19 - 32 mEq/L   Glucose, Bld 138 (*) 70 - 99 mg/dL   BUN 17  6 - 23 mg/dL   Creatinine, Ser 1.50 (*) 0.50 - 1.35 mg/dL   Calcium 9.0  8.4 - 10.5 mg/dL   GFR calc non Af Amer 53 (*) >90 mL/min   GFR calc Af Amer 61 (*) >90 mL/min   Comment: (NOTE)     The eGFR has been calculated using the CKD EPI equation.     This calculation has not been validated in all clinical situations.     eGFR's persistently <90 mL/min signify possible Chronic Kidney     Disease.     Performed at [facility]  GLUCOSE, CAPILLARY     Status: Abnormal   Collection Time    04/08/13 11:38 AM      Result Value Range   Glucose-Capillary 103 (*) 70 - 99 mg/dL   Comment 1 Notify RN      Physical Findings: AIMS: Facial and Oral Movements Muscles of Facial Expression: None, normal Lips and Perioral Area: None, normal Jaw: None, normal Tongue: None, normal,Extremity Movements Upper (arms, wrists, hands, fingers): None, normal Lower (legs, knees, ankles, toes): None, normal, Trunk Movements Neck, shoulders, hips: None, normal, Overall Severity Severity of abnormal movements (highest score from questions above): None, normal Incapacitation due to  abnormal movements: None, normal Patient's awareness of abnormal movements (rate only patient's report): No Awareness, Dental Status Current problems with teeth and/or dentures?: No Does patient usually wear dentures?: No  CIWA:  CIWA-Ar Total: 0 COWS:  COWS Total Score: 0  Treatment Plan Summary: Daily contact with patient to assess and evaluate symptoms and progress in treatment Medication management  Plan:  Review of chart, vital signs, medications, and notes. 1-Individual and group therapy 2-Medication management for depression, paranoia/delusions, and anxiety: Continue Ivega 9 mg for psychosis.  Medications reviewed with the patient who stated no untoward effects. 3-Coping skills for depression, anxiety, and paranoia/delusions 4-Continue crisis stabilization and management 5-Address health issues--monitoring POCT and vital signs, elevated BP--medications added by Internal Medicine 6-Treatment plan in progress to prevent relapse of depression and anxiety 7- Will continue patient on current medication regimen. Anticipate d/c on Friday with ACT team in place.  Medical Decision Making Problem Points:  Established problem, improving (1) and Review of psycho-social stressors (1) Data Points:  Review of new medications or change in dosage (2)  I certify that inpatient services furnished can reasonably be expected to improve the patient's condition.   Elmarie Shiley, NP-C 04/08/2013, 2:49 PM

## 2013-04-08 NOTE — Progress Notes (Signed)
The focus of this group is to help patients review their daily goal of treatment and discuss progress on daily workbooks. Pt attended the evening group session but responded minimally to discussion prompts from the [place]. Pt reported having a "fair day," with nothing much happening to make it good or bad. Pt appeared tired and was observed rubbing his eyes several times during group. Pt's affect was flat.

## 2013-04-08 NOTE — BHH Group Notes (Signed)
[facility] [employer] Group Therapy  04/08/2013 , 3:52 PM    Type of Therapy:  Mental Health Association Presentation  Participation Level:  Ivan [name] attended the initial part of group.  He was silent.  He was called out after the first 10 minutes to see the Dr.  He did not return.    Summary of Progress/Problems:  [name] from [place] came to present his recovery story and play the guitar.    [name] 04/08/2013 , 3:52 PM

## 2013-04-08 NOTE — Progress Notes (Signed)
D: Patient visible in the milieu, attended groups with minimal participation. Appears anxious and somewhat irritable when speaking with this writer, avoids eye contact and curt with his responses. States he will be discharging tomorrow though states "I was ready on Monday". Still unable to verbalize exactly where he will be discharging to, continues to voice frustration with family when speaking about them and continues to feel they are out to get him.  A: Support and encouragement offered. Medications given as ordered. Maintained Q69min checks for safety. R: Behavior appropriate. Contracts for safety in the hospital, med complaint, no concerns voiced.

## 2013-04-09 MED ORDER — HYDRALAZINE HCL 100 MG PO TABS: 100.0000 mg | ORAL_TABLET | Freq: Four times a day (QID) | ORAL | Status: DC

## 2013-04-09 MED ORDER — CLONIDINE HCL 0.1 MG PO TABS: 0.1000 mg | ORAL_TABLET | Freq: Two times a day (BID) | ORAL | Status: DC

## 2013-04-09 MED ORDER — GLIPIZIDE 5 MG PO TABS: 5.0000 mg | ORAL_TABLET | Freq: Every day | ORAL | Status: DC

## 2013-04-09 MED ORDER — POTASSIUM CHLORIDE ER 10 MEQ PO TBCR: 10.0000 meq | EXTENDED_RELEASE_TABLET | Freq: Two times a day (BID) | ORAL | Status: DC

## 2013-04-09 MED ORDER — HYDRALAZINE HCL 100 MG PO TABS: 100.0000 mg | ORAL_TABLET | Freq: Three times a day (TID) | ORAL | Status: DC

## 2013-04-09 MED ORDER — AMLODIPINE BESYLATE 5 MG PO TABS: 5.0000 mg | ORAL_TABLET | Freq: Two times a day (BID) | ORAL | Status: DC

## 2013-04-09 MED ORDER — FUROSEMIDE 40 MG PO TABS: 40.0000 mg | ORAL_TABLET | Freq: Two times a day (BID) | ORAL | Status: DC

## 2013-04-09 MED ORDER — TRAZODONE HCL 50 MG PO TABS: 50.0000 mg | ORAL_TABLET | Freq: Every evening | ORAL | Status: DC | PRN

## 2013-04-09 MED ORDER — LABETALOL HCL 200 MG PO TABS: 200.0000 mg | ORAL_TABLET | Freq: Two times a day (BID) | ORAL | Status: DC

## 2013-04-09 MED ORDER — PALIPERIDONE ER 9 MG PO TB24: 9.0000 mg | ORAL_TABLET | Freq: Every day | ORAL | Status: DC

## 2013-04-09 MED ORDER — ASPIRIN-CAFFEINE 500-32.5 MG PO TABS: 1.0000 | ORAL_TABLET | Freq: Every day | ORAL | Status: DC | PRN

## 2013-04-09 NOTE — Progress Notes (Signed)
[facility] Adult Case Management Discharge Plan :  Will you be returning to the same living situation after discharge: No. At discharge, do you have transportation home?:Yes,  sheriff Do you have the ability to pay for your medications:Yes,  MCR  Release of information consent forms completed and in the chart;  Patient's signature needed at discharge.  Patient to Follow up at: Follow-up Information   Follow up with [facility] On 04/13/2013. (Monday between 8 and 9AM for your hospital follow up appointment)    Contact information:   [place] [phone]      Patient denies SI/HI:   Yes,  yes    Safety Planning and Suicide Prevention discussed:  Yes,  yes  Ivan [name] 04/09/2013, 11:29 AM

## 2013-04-09 NOTE — Progress Notes (Signed)
Pt is pleasant 52 yr old male that presents appropriate in affect and mood. Pt is currently denying any SI/HI/AVH. At times it can still be inferred that this pt is paranoid. At time pt will focus on his surroundings when speaking with this Probation officer. Pt had only 0.5 hours of sleep on Wednesday night. Pt refused his second dose of Trazodone. Pt reports a usual habitat of being awake in the middle of the night at home. Per pt he has a hx of working third shift. Pt attended group this evening. Pt was observed interacting appropriately within the milieu.  A: Writer administered scheduled and prn medications to pt. Continued support and availability as needed was extended to this pt. Staff continue to monitor pt with q87min checks.  R: No adverse drug reactions noted. Pt receptive to treatment. Pt remains safe at this time.

## 2013-04-09 NOTE — [facility] Suicide Risk Assessment (Signed)
Suicide Risk Assessment  Discharge Assessment     Demographic Factors:  Male and Unemployed  Mental Status Per Nursing Assessment::   On Admission:  NA  Current Mental Status by Physician: patient denies suicidal ideation, intent or plan  Loss Factors: Decrease in vocational status  Historical Factors: Impulsivity  Risk Reduction Factors:   Sense of responsibility to family, Living with another person, especially a relative and Positive social support  Continued Clinical Symptoms:  Schizophrenia:   Paranoid or undifferentiated type  Cognitive Features That Contribute To Risk:  Closed-mindedness Polarized thinking    Suicide Risk:  Minimal: No identifiable suicidal ideation.  Patients presenting with no risk factors but with morbid ruminations; may be classified as minimal risk based on the severity of the depressive symptoms  Discharge Diagnoses:   AXIS I:  Paranoid schizophrenia, chronic condition  AXIS II:  Deferred AXIS III:   Past Medical History  Diagnosis Date  . Diabetes mellitus   . Hypertension   . Bipolar 1 disorder   . Schizoaffective disorder   . Hypokalemia 04/02/2013  . Edema of both legs 04/07/2013   AXIS IV:  other psychosocial or environmental problems and problems related to social environment AXIS V:  61-70 mild symptoms  Plan Of Care/Follow-up recommendations:  Activity:  as tolerated Diet:  healthy Tests:  routine Other:  patient to keep her after care appointment  Is patient on multiple antipsychotic therapies at discharge:  No   Has Patient had three or more failed trials of antipsychotic monotherapy by history:  No  Recommended Plan for Multiple Antipsychotic Therapies: N/A  Ziquan Fidel,MD 04/09/2013, 9:26 AM

## 2013-04-09 NOTE — Progress Notes (Signed)
Patient ID: Ivan [name], male   DOB: 08-28-1960, 52 y.o.   MRN: [id number] Patient discharged per MD orders. Denies SI, HI and AVH.Paitent given education re: medications and outpatient follow up appts. Verbalized his understanding of discharge instructions.Personal property returned to him. Discharged to lobby and transported home per [facility].

## 2013-04-09 NOTE — H&P (Signed)
Physician Discharge Summary Note  Patient:  Ivan [name] is an 52 y.o., male MRN:  [id number] DOB:  1961/06/11 Patient phone:  [phone] (home)  Patient address:   [address] [place] [address]   Date of Admission:  03/31/2013 Date of Discharge: 04/09/2013  Discharge Diagnoses: Principal Problem:   Paranoid schizophrenia, chronic condition Active Problems:   Diabetes   Essential hypertension, benign   Hypokalemia   Edema of both legs  Axis Diagnosis:  AXIS I: Paranoid schizophrenia, chronic condition  AXIS II: Deferred  AXIS III:  Past Medical History   Diagnosis  Date   .  Diabetes mellitus    .  Hypertension    .  Bipolar 1 disorder    .  Schizoaffective disorder    .  Hypokalemia  04/02/2013   .  Edema of both legs  04/07/2013    AXIS IV: other psychosocial or environmental problems and problems related to social environment  AXIS V: 61-70 mild symptoms  Level of Care:  OP  Hospital Course:   Mr. Cregar is a 52 year old man, divorced and lives alone. He reports Long history of mental illness dating back to 2001. He was diagnosed with Paranoid schizophrenia. He reports that he was brought to the [facility] by his family wearing nothing but a towel with his clothes in a bag. He stated that he stopped taking his medications because "they make my eyes red". He reports that he is not feeling well without his medications and need to get on some other medications that can help him. He presents with worsening paranoid delusions, irritable mood, disorganized thought process, poor insight and judgment. He has a fixed delusion that he is been spying on by his family and as a result in constant fight with them. He denies alcohol and illicit drugs use. He also denies suicidal ideation, intent and plan.  While a patient in this hospital, YERALDO DORFF was enrolled in group counseling and activities as well as received the following medication Current facility-administered  medications:acetaminophen (TYLENOL) tablet 650 mg, 650 mg, Oral, Q6H PRN, Laverle Hobby, PA-C, 650 mg at 04/09/13 1302;  alum & mag hydroxide-simeth (MAALOX/MYLANTA) 200-200-20 MG/5ML suspension 30 mL, 30 mL, Oral, Q4H PRN, Maurine Minister Simon, PA-C;  amLODipine (NORVASC) tablet 10 mg, 10 mg, Oral, Daily, Eugenie Filler, MD, 10 mg at 04/09/13 0757 benztropine (COGENTIN) tablet 1 mg, 1 mg, Oral, Daily, Mojeed Akintayo, 1 mg at 04/09/13 0758;  cloNIDine (CATAPRES) tablet 0.1 mg, 0.1 mg, Oral, BID, Eugenie Filler, MD, 0.1 mg at 04/09/13 [id number];  furosemide (LASIX) tablet 40 mg, 40 mg, Oral, BID, Eugenie Filler, MD, 40 mg at 04/09/13 0757;  glipiZIDE (GLUCOTROL) tablet 5 mg, 5 mg, Oral, QAC breakfast, Eugenie Filler, MD, 5 mg at 04/09/13 0640 hydrALAZINE (APRESOLINE) tablet 100 mg, 100 mg, Oral, Q8H, Velvet Bathe, MD, 100 mg at 04/09/13 1300;  insulin aspart (novoLOG) injection 0-9 Units, 0-9 Units, Subcutaneous, TID WC, Belkys A Regalado, MD, 1 Units at 04/09/13 [id number];  labetalol (NORMODYNE) tablet 200 mg, 200 mg, Oral, BID, Eugenie Filler, MD, 200 mg at 04/09/13 0757;  magnesium hydroxide (MILK OF MAGNESIA) suspension 30 mL, 30 mL, Oral, Daily PRN, Laverle Hobby, PA-C magnesium oxide (MAG-OX) tablet 400 mg, 400 mg, Oral, BID, Eugenie Filler, MD, 400 mg at 04/09/13 0756;  nicotine polacrilex (NICORETTE) gum 2 mg, 2 mg, Oral, PRN, Mojeed Akintayo, 2 mg at 04/09/13 0800;  paliperidone (INVEGA) 24 hr  tablet 9 mg, 9 mg, Oral, Daily, Mojeed Akintayo, 9 mg at 04/09/13 0756;  potassium chloride (K-DUR,KLOR-CON) CR tablet 10 mEq, 10 mEq, Oral, BID, Waylan Boga, NP, 10 mEq at 04/09/13 0757 traZODone (DESYREL) tablet 50 mg, 50 mg, Oral, QHS,MR X 1, Spencer E Simon, PA-C, 50 mg at 04/08/13 2120 Current outpatient prescriptions:amLODipine (NORVASC) 5 MG tablet, Take 1 tablet (5 mg total) by mouth 2 (two) times daily., Disp: , Rfl: ;  Aspirin-Caffeine (BAYER BACK & BODY PAIN EX ST) 500-32.5 MG TABS, Take 1  tablet by mouth daily as needed (for back pain)., Disp: , Rfl: ;  cloNIDine (CATAPRES) 0.1 MG tablet, Take 1 tablet (0.1 mg total) by mouth 2 (two) times daily., Disp: 60 tablet, Rfl: 11 furosemide (LASIX) 40 MG tablet, Take 1 tablet (40 mg total) by mouth 2 (two) times daily., Disp: 30 tablet, Rfl: ;  glipiZIDE (GLUCOTROL) 5 MG tablet, Take 1 tablet (5 mg total) by mouth daily before breakfast., Disp: 30 tablet, Rfl: 0;  hydrALAZINE (APRESOLINE) 100 MG tablet, Take 1 tablet (100 mg total) by mouth 3 (three) times daily., Disp: 90 tablet, Rfl: 0 labetalol (NORMODYNE) 200 MG tablet, Take 1 tablet (200 mg total) by mouth 2 (two) times daily., Disp: , Rfl: ;  paliperidone (INVEGA) 9 MG 24 hr tablet, Take 1 tablet (9 mg total) by mouth daily., Disp: 30 tablet, Rfl: 0;  potassium chloride (K-DUR) 10 MEQ tablet, Take 1 tablet (10 mEq total) by mouth 2 (two) times daily., Disp: , Rfl:  traZODone (DESYREL) 50 MG tablet, Take 1 tablet (50 mg total) by mouth at bedtime and may repeat dose one time if needed., Disp: 60 tablet, Rfl: 0 The patient was not taking any psychotropic medication at the time of his admission. He was started on Invega for paranoid delusions which was increased to 9 mg by his discharge. The patient was followed by Internal Medicine for management of his diabetes and high blood pressure with appropriate medications being ordered for these conditions. The patient remained very guarded with staff and was observed by nursing staff to be acting paranoid. When prompted the patient would talk about his fixed delusion that his family was spying on him and causing him general trouble in life. The patient became irritable when staff attempted to assess him making poor eye contact and denying any need to be in the hospital. The patient was compliant with his medication and did not present a behavior problem during his admission. The patient reported he would be going to live at a hotel instead at his family's  due to continued conflict with them. Patient attended treatment team meeting this am and met with treatment team members. Pt symptoms, treatment plan and response to treatment discussed. [name] endorsed that their symptoms have improved. Pt also stated that they are stable for discharge.  In other to control Principal Problem:   Paranoid schizophrenia, chronic condition Active Problems:   Diabetes   Essential hypertension, benign   Hypokalemia   Edema of both legs , they will continue psychiatric care on outpatient basis. They will follow-up at      Follow-up Information   Follow up with [facility] On 04/13/2013. (Monday between 8 and 9AM for your hospital follow up appointment)    Contact information:   [place] [phone]    .  In addition they were instructed to take all your medications as prescribed by your mental healthcare provider, to report any  adverse effects and or reactions from your medicines to your outpatient provider promptly, patient is instructed and cautioned to not engage in alcohol and or illegal drug use while on prescription medicines, in the event of worsening symptoms, patient is instructed to call the crisis hotline, 911 and or go to the nearest ED for appropriate evaluation and treatment of symptoms.   Upon discharge, patient adamantly denies suicidal, homicidal ideations, auditory, visual hallucinations and or delusional thinking. They left [facility] with all personal belongings in no apparent distress.  Consults:  See electronic record for details  Significant Diagnostic Studies:  See electronic record for details  Discharge Vitals:   Blood pressure 143/77, pulse 78, temperature 98 F (36.7 C), temperature source Oral, resp. rate 18, height 6' (1.829 m)..  Mental Status Exam: See Mental Status Examination and Suicide Risk Assessment completed by Attending Physician prior to discharge.  Discharge destination:  Home  Is patient on multiple  antipsychotic therapies at discharge:  No  Has Patient had three or more failed trials of antipsychotic monotherapy by history: N/A Recommended Plan for Multiple Antipsychotic Therapies: N/A    Medication List    STOP taking these medications       metFORMIN 500 MG tablet  Commonly known as:  GLUCOPHAGE      TAKE these medications     Indication   amLODipine 5 MG tablet  Commonly known as:  NORVASC  Take 1 tablet (5 mg total) by mouth 2 (two) times daily.   Indication:  High Blood Pressure     Aspirin-Caffeine 500-32.5 MG Tabs  Commonly known as:  BAYER BACK & BODY PAIN EX ST  Take 1 tablet by mouth daily as needed (for back pain).   Indication:  Headache     cloNIDine 0.1 MG tablet  Commonly known as:  CATAPRES  Take 1 tablet (0.1 mg total) by mouth 2 (two) times daily.   Indication:  High Blood Pressure     furosemide 40 MG tablet  Commonly known as:  LASIX  Take 1 tablet (40 mg total) by mouth 2 (two) times daily.   Indication:  High Blood Pressure     glipiZIDE 5 MG tablet  Commonly known as:  GLUCOTROL  Take 1 tablet (5 mg total) by mouth daily before breakfast.   Indication:  Type 2 Diabetes     hydrALAZINE 100 MG tablet  Commonly known as:  APRESOLINE  Take 1 tablet (100 mg total) by mouth 3 (three) times daily.   Indication:  High Blood Pressure     labetalol 200 MG tablet  Commonly known as:  NORMODYNE  Take 1 tablet (200 mg total) by mouth 2 (two) times daily.   Indication:  High Blood Pressure     paliperidone 9 MG 24 hr tablet  Commonly known as:  INVEGA  Take 1 tablet (9 mg total) by mouth daily.   Indication:  Schizophrenia     potassium chloride 10 MEQ tablet  Commonly known as:  K-DUR  Take 1 tablet (10 mEq total) by mouth 2 (two) times daily.   Indication:  Low Amount of Potassium in the Blood     traZODone 50 MG tablet  Commonly known as:  DESYREL  Take 1 tablet (50 mg total) by mouth at bedtime and may repeat dose one time if needed.    Indication:  Trouble Sleeping       Follow-up Information   Follow up with [facility] On 04/13/2013. (Monday between 8 and 9AM  for your hospital follow up appointment)    Contact information:   [place] [phone]     Follow-up recommendations:   Activities: Resume typical activities Diet: Resume typical diet Tests: none Other: Follow up with outpatient provider and report any side effects to out patient prescriber.  Comments:  Take all your medications as prescribed by your mental healthcare provider. Report any adverse effects and or reactions from your medicines to your outpatient provider promptly. Patient is instructed and cautioned to not engage in alcohol and or illegal drug use while on prescription medicines. In the event of worsening symptoms, patient is instructed to call the crisis hotline, 911 and or go to the nearest ED for appropriate evaluation and treatment of symptoms. Follow-up with your primary care provider for your other medical issues, concerns and or health care needs.  SignedElmarie Shiley NP-C 04/09/2013 3:57 PM

## 2013-04-09 NOTE — Tx Team (Signed)
  Interdisciplinary Treatment Plan Update   Date Reviewed:  04/09/2013  Time Reviewed:  11:30 AM  Progress in Treatment:   Attending groups: Yes Participating in groups: Yes Taking medication as prescribed: Yes  Tolerating medication: Yes Family/Significant other contact made: Yes  Patient understands diagnosis: Yes  Discussing patient identified problems/goals with staff: Yes Medical problems stabilized or resolved: Yes Denies suicidal/homicidal ideation: Yes Patient has not harmed self or others: Yes  For review of initial/current patient goals, please see plan of care.  Estimated Length of Stay:  D/C today  Reason for Continuation of Hospitalization:   New Problems/Goals identified:  N/A  Discharge Plan or Barriers:   return to hotel, follow up outpt  Additional Comments:  Attendees:  Signature: Corena Pilgrim, MD 04/09/2013 11:30 AM   Signature: Ripley Fraise, LCSW 04/09/2013 11:30 AM  Signature: Elmarie Shiley, NP 04/09/2013 11:30 AM  Signature:  04/09/2013 11:30 AM  Signature: Clinton Sawyer, RN 04/09/2013 11:30 AM  Signature:  04/09/2013 11:30 AM  Signature:   04/09/2013 11:30 AM  Signature:    Signature:    Signature:    Signature:    Signature:    Signature:      Scribe for Treatment Team:   Ripley Fraise, LCSW  04/09/2013 11:30 AM

## 2013-04-13 NOTE — H&P (Signed)
Seen and agreed. Lavonne Kinderman, MD 

## 2013-04-14 NOTE — Progress Notes (Signed)
Patient Discharge Instructions:  After Visit Summary (AVS):   Faxed to:  04/14/13 Discharge Summary Note:   Faxed to:  04/14/13 Psychiatric Admission Assessment Note:   Faxed to:  04/14/13 Suicide Risk Assessment - Discharge Assessment:   Faxed to:  04/14/13 Faxed/Sent to the Next Level Care provider:  04/14/13 Faxed to [facility] [place], 04/14/2013, 3:29 PM

## 2013-04-22 DIAGNOSIS — I959 Hypotension, unspecified: Secondary | ICD-10-CM

## 2013-05-04 ENCOUNTER — Other Ambulatory Visit (HOSPITAL_COMMUNITY): Payer: Self-pay | Admitting: Psychiatry

## 2014-04-07 ENCOUNTER — Encounter (HOSPITAL_COMMUNITY): Payer: Self-pay | Admitting: Emergency Medicine

## 2014-04-07 ENCOUNTER — Emergency Department (HOSPITAL_COMMUNITY)
Admission: EM | Admit: 2014-04-07 | Discharge: 2014-04-08 | Disposition: A | Discharge status: Other Acute Inpatient Hospital | Payer: 59 | Attending: Emergency Medicine | Admitting: Emergency Medicine

## 2014-04-07 DIAGNOSIS — F259 Schizoaffective disorder, unspecified: Secondary | ICD-10-CM | POA: Insufficient documentation

## 2014-04-07 DIAGNOSIS — F319 Bipolar disorder, unspecified: Secondary | ICD-10-CM | POA: Insufficient documentation

## 2014-04-07 DIAGNOSIS — F29 Unspecified psychosis not due to a substance or known physiological condition: Secondary | ICD-10-CM | POA: Insufficient documentation

## 2014-04-07 DIAGNOSIS — N289 Disorder of kidney and ureter, unspecified: Secondary | ICD-10-CM

## 2014-04-07 DIAGNOSIS — R45851 Suicidal ideations: Secondary | ICD-10-CM

## 2014-04-07 DIAGNOSIS — I10 Essential (primary) hypertension: Secondary | ICD-10-CM | POA: Insufficient documentation

## 2014-04-07 DIAGNOSIS — E119 Type 2 diabetes mellitus without complications: Secondary | ICD-10-CM | POA: Insufficient documentation

## 2014-04-07 DIAGNOSIS — F17200 Nicotine dependence, unspecified, uncomplicated: Secondary | ICD-10-CM | POA: Insufficient documentation

## 2014-04-07 DIAGNOSIS — F200 Paranoid schizophrenia: Secondary | ICD-10-CM

## 2014-04-07 LAB — BASIC METABOLIC PANEL
ANION GAP: 15 (ref 5–15)
BUN: 36 mg/dL — AB (ref 6–23)
CO2: 23 mEq/L (ref 19–32)
Calcium: 9.2 mg/dL (ref 8.4–10.5)
Chloride: 106 mEq/L (ref 96–112)
Creatinine, Ser: 2.28 mg/dL — ABNORMAL HIGH (ref 0.50–1.35)
GFR, EST AFRICAN AMERICAN: 37 mL/min — AB (ref >90)
GFR, EST NON AFRICAN AMERICAN: 31 mL/min — AB (ref >90)
Glucose, Bld: 97 mg/dL (ref 70–99)
POTASSIUM: 3.7 meq/L (ref 3.7–5.3)
SODIUM: 144 meq/L (ref 137–147)

## 2014-04-07 LAB — URINALYSIS, ROUTINE W REFLEX MICROSCOPIC
Bilirubin Urine: NEGATIVE
Glucose, UA: NEGATIVE mg/dL
Leukocytes, UA: NEGATIVE
NITRITE: NEGATIVE
PH: 6 (ref 5.0–8.0)
Protein, ur: 100 mg/dL — AB
SPECIFIC GRAVITY, URINE: 1.025 (ref 1.005–1.030)
UROBILINOGEN UA: 0.2 mg/dL (ref 0.0–1.0)

## 2014-04-07 LAB — CBC WITH DIFFERENTIAL/PLATELET
BASOS ABS: 0 10*3/uL (ref 0.0–0.1)
BASOS PCT: 0 % (ref 0–1)
EOS ABS: 0.1 10*3/uL (ref 0.0–0.7)
Eosinophils Relative: 2 % (ref 0–5)
HCT: 32.9 % — ABNORMAL LOW (ref 39.0–52.0)
Hemoglobin: 10.7 g/dL — ABNORMAL LOW (ref 13.0–17.0)
Lymphocytes Relative: 22 % (ref 12–46)
Lymphs Abs: 1.6 10*3/uL (ref 0.7–4.0)
MCH: 29.3 pg (ref 26.0–34.0)
MCHC: 32.5 g/dL (ref 30.0–36.0)
MCV: 90.1 fL (ref 78.0–100.0)
MONOS PCT: 14 % — AB (ref 3–12)
Monocytes Absolute: 1 10*3/uL (ref 0.1–1.0)
NEUTROS ABS: 4.5 10*3/uL (ref 1.7–7.7)
NEUTROS PCT: 62 % (ref 43–77)
PLATELETS: 338 10*3/uL (ref 150–400)
RBC: 3.65 MIL/uL — ABNORMAL LOW (ref 4.22–5.81)
RDW: 15.5 % (ref 11.5–15.5)
WBC: 7.3 10*3/uL (ref 4.0–10.5)

## 2014-04-07 LAB — RAPID URINE DRUG SCREEN, HOSP PERFORMED
Amphetamines: NOT DETECTED
BARBITURATES: NOT DETECTED
Benzodiazepines: NOT DETECTED
Cocaine: NOT DETECTED
OPIATES: NOT DETECTED
TETRAHYDROCANNABINOL: NOT DETECTED

## 2014-04-07 LAB — URINE MICROSCOPIC-ADD ON

## 2014-04-07 LAB — CBG MONITORING, ED
Glucose-Capillary: 106 mg/dL — ABNORMAL HIGH (ref 70–99)
Glucose-Capillary: 121 mg/dL — ABNORMAL HIGH (ref 70–99)

## 2014-04-07 LAB — ETHANOL: Alcohol, Ethyl (B): <11 mg/dL (ref 0–11)

## 2014-04-07 MED ORDER — TRAZODONE HCL 50 MG PO TABS
50.0000 mg | ORAL_TABLET | Freq: Every evening | ORAL | Status: DC | PRN
  Administered 2014-04-07: 50 mg via ORAL
  Filled 2014-04-07: qty 1

## 2014-04-07 MED ORDER — CLONIDINE HCL 0.1 MG PO TABS
0.1000 mg | ORAL_TABLET | Freq: Two times a day (BID) | ORAL | Status: DC
  Administered 2014-04-07 – 2014-04-08 (×4): 0.1 mg via ORAL
  Filled 2014-04-07 (×4): qty 1

## 2014-04-07 MED ORDER — IBUPROFEN 400 MG PO TABS: 600.0000 mg | ORAL_TABLET | Freq: Three times a day (TID) | ORAL | Status: DC | PRN

## 2014-04-07 MED ORDER — AMLODIPINE BESYLATE 5 MG PO TABS
5.0000 mg | ORAL_TABLET | Freq: Every day | ORAL | Status: DC
  Administered 2014-04-07: 5 mg via ORAL
  Administered 2014-04-08: 10 mg via ORAL
  Filled 2014-04-07 (×2): qty 1

## 2014-04-07 MED ORDER — NAPROXEN 250 MG PO TABS
250.0000 mg | ORAL_TABLET | Freq: Once | ORAL | Status: AC
  Administered 2014-04-07: 250 mg via ORAL
  Filled 2014-04-07: qty 1

## 2014-04-07 MED ORDER — ACETAMINOPHEN 325 MG PO TABS
650.0000 mg | ORAL_TABLET | Freq: Four times a day (QID) | ORAL | Status: DC | PRN
  Administered 2014-04-07: 650 mg via ORAL
  Filled 2014-04-07: qty 2

## 2014-04-07 MED ORDER — PALIPERIDONE ER 6 MG PO TB24
6.0000 mg | ORAL_TABLET | Freq: Every day | ORAL | Status: DC
  Administered 2014-04-07 – 2014-04-08 (×2): 6 mg via ORAL
  Filled 2014-04-07 (×4): qty 1

## 2014-04-07 MED ORDER — LABETALOL HCL 200 MG PO TABS
200.0000 mg | ORAL_TABLET | Freq: Two times a day (BID) | ORAL | Status: DC
  Administered 2014-04-07 – 2014-04-08 (×3): 200 mg via ORAL
  Filled 2014-04-07 (×7): qty 1

## 2014-04-07 MED ORDER — LABETALOL HCL 200 MG PO TABS
ORAL_TABLET | ORAL | Status: AC
  Filled 2014-04-07: qty 1

## 2014-04-07 MED ORDER — LORAZEPAM 1 MG PO TABS: 1.0000 mg | ORAL_TABLET | Freq: Three times a day (TID) | ORAL | Status: DC | PRN

## 2014-04-07 NOTE — ED Notes (Signed)
Pt is very vague with his complaints, states "I'm all messed up, my head is messed up, I am hurting all over, I need some help"  Pt admits to thinking about harming himself, and states he wants to die.  When questioned further, pt only states "I'm just all tore up, everything is all tore up"

## 2014-04-07 NOTE — ED Notes (Signed)
Pt will not answer questions, when ask pt holds up his head and say" your voice is irritating me, please stop talking, you can not help me, this has been going on before I was born, I need to go to [place] or some where."

## 2014-04-07 NOTE — ED Provider Notes (Signed)
Discussed with [facility] psychiatrist. Patient will not cooperate for interview. They want to interview him in person patient will be transferred to the psychiatric emergency department at [facility] long. Patient is currently stable. Patient is IVC. Patient has been accepted by the nursing service at [facility] long psychiatric ED and has been accepted by Dr. Wilson Singer from the emergency department.  Fredia Sorrow, MD 04/07/14 1745

## 2014-04-07 NOTE — ED Notes (Signed)
Report given to [facility] ED unit, and Police called for transport

## 2014-04-07 NOTE — ED Notes (Signed)
Behavioral health called stating pt could go home. Dr Rogene Houston assessed pt , pt will not cooperate, Pt unable to go home per MD. Pt was IVC this am. Behavioral health is aware.

## 2014-04-07 NOTE — ED Notes (Signed)
Pt. Uncooperative during TTS assessment. Pt. To be reassessed later this morning.

## 2014-04-07 NOTE — [facility] Counselor (Signed)
Received a call from patient's nurse stating that patient was alert and oriented and ready to complete an assessment. Writer asked nurse to place machine in patient's room. Writer attempted to assess patient. Patient tells this Probation officer, "I have been through this before just get me my ID so I can get out of here". Writer explained to patient that he would need to be assessed before any decisions could be made. Patient stated, "Turn the camera off" and "Didn't you hear me mam I said turn the camera off". Writer made nursing staff-Judy aware that patient has refused to complete an assessment for the 3rd time today. Writer will notify AC-Eric of patient's refusal to complete the [facility] assessment.

## 2014-04-07 NOTE — ED Notes (Signed)
Patient sitting in chair in his room. Appears tired, paranoid. Refuses suggestion to lie in bed to rest.  Q 15 checks continue.

## 2014-04-07 NOTE — ED Notes (Signed)
Pt. Uncooperative. Pt. States "I am just out of my head" Pt. Reports "I just want to sleep forever" Pt. Uncooperative. Pt. Refusing to answer questions. Pt. States "I just want to be left alone" Pt. Denies drug/alcohol use.

## 2014-04-07 NOTE — ED Notes (Signed)
Patient declines using the bed because of his on going back problems.   Encouragement offered. Bed adjusted. Patient remains in the chair.  Q 15 safety checks continue.

## 2014-04-07 NOTE — ED Notes (Signed)
Pt agitated but took meds without refusing and allowed staff to check vitals.  Pt requesting shower at this time.  Staff and security will escort pt to shower.

## 2014-04-07 NOTE — ED Notes (Signed)
Patient irritable. Reports on going right arm/shoulder and back pain from previous injuries. States that he has been answering questions for 12 years and he wants a shower to put heat on his arm/shoulder. Patient will not answer further questions at this time. [employer] officer to refer to his papers.  Patient allowed to shower.  Q 15 safety checks in place.

## 2014-04-07 NOTE — Progress Notes (Signed)
Writer spoke to Dr. Christy Gentles regarding the patients unwillingness to participate in the assessment.  Patient would not answer questions regarding SI/HI/Psychosis.   Another assessment is scheduled for the patient at 8:30am

## 2014-04-07 NOTE — ED Notes (Signed)
Pt refusing telepsych. Charge nurse aware.

## 2014-04-07 NOTE — ED Notes (Addendum)
Pt to go to [facility] psych ED to have a face to face assessment by [facility]. Bed space confirmed by Zara Council, charge nurse

## 2014-04-07 NOTE — ED Provider Notes (Signed)
CSN: [id number]     Arrival date & time 04/07/14  0148 History   First MD Initiated Contact with Patient 04/07/14 0302     Chief Complaint  Patient presents with  . V70.1       The history is provided by the patient. The history is limited by the condition of the patient.   Patient presents for multiple complaints - he reports he is having "family problems" and needs "some help"   He also reports he wants to die  He reports his back "hurts" and his "head hurts" No chest or abdominal pain is reported He does not offer any other details    Past Medical History  Diagnosis Date  . Diabetes mellitus   . Hypertension   . Bipolar 1 disorder   . Schizoaffective disorder   . Hypokalemia 04/02/2013  . Edema of both legs 04/07/2013   Past Surgical History  Procedure Laterality Date  . Breast surgery     No family history on file. History  Substance Use Topics  . Smoking status: Current Every Day Smoker -- 0.50 packs/day    Types: Cigarettes, Cigars  . Smokeless tobacco: Never Used  . Alcohol Use: Yes     Comment: once or twice a month    Review of Systems  Unable to perform ROS: Psychiatric disorder      Allergies  Review of patient's allergies indicates no known allergies.  Home Medications   Prior to Admission medications   Medication Sig Start Date End Date Taking? Authorizing Provider  amLODipine (NORVASC) 5 MG tablet Take 1 tablet (5 mg total) by mouth 2 (two) times daily. 04/09/13   Elmarie Shiley, NP  Aspirin-Caffeine (BAYER BACK & BODY PAIN EX ST) 500-32.5 MG TABS Take 1 tablet by mouth daily as needed (for back pain). 04/09/13   Elmarie Shiley, NP  cloNIDine (CATAPRES) 0.1 MG tablet Take 1 tablet (0.1 mg total) by mouth 2 (two) times daily. 04/09/13   Elmarie Shiley, NP  furosemide (LASIX) 40 MG tablet Take 1 tablet (40 mg total) by mouth 2 (two) times daily. 04/09/13   Elmarie Shiley, NP  glipiZIDE (GLUCOTROL) 5 MG tablet Take 1 tablet (5 mg total) by mouth daily before  breakfast. 04/09/13   Elmarie Shiley, NP  hydrALAZINE (APRESOLINE) 100 MG tablet Take 1 tablet (100 mg total) by mouth 3 (three) times daily. 04/09/13   Elmarie Shiley, NP  labetalol (NORMODYNE) 200 MG tablet Take 1 tablet (200 mg total) by mouth 2 (two) times daily. 04/09/13   Elmarie Shiley, NP  paliperidone (INVEGA) 9 MG 24 hr tablet Take 1 tablet (9 mg total) by mouth daily. 04/09/13   Elmarie Shiley, NP  potassium chloride (K-DUR) 10 MEQ tablet Take 1 tablet (10 mEq total) by mouth 2 (two) times daily. 04/09/13   Elmarie Shiley, NP  traZODone (DESYREL) 50 MG tablet Take 1 tablet (50 mg total) by mouth at bedtime and may repeat dose one time if needed. 04/09/13   Elmarie Shiley, NP   BP 140/108  Pulse 80  Temp(Src) 98.8 F (37.1 C) (Oral)  Resp 16  Ht 6\' 3"  (1.905 m)  Wt 300 lb (136.079 kg)  BMI 37.50 kg/m2  SpO2 99% Physical Exam CONSTITUTIONAL: Well developed/well nourished HEAD: Normocephalic/atraumatic EYES: pt refuses to open eyes ENMT: Mucous membranes moist NECK: supple no meningeal signs SPINE: no bruising noted to his back CV: S1/S2 noted, no murmurs/rubs/gallops noted LUNGS: Lungs are clear to auscultation bilaterally, no apparent distress ABDOMEN: soft,  nontender, no rebound or guarding GU:no cva tenderness NEURO: Pt is awake/alert, moves all extremitiesx4, no arm or leg drift EXTREMITIES: pulses normal, full ROM SKIN: warm, color normal PSYCH: flat affect  ED Course  Procedures  3:21 AM Pt with h/o schizoaffective disorder He reported to nurse he wanted to die Will consult psych Pt is medically stable at this time (he does have HTN and worsening renal insufficiency otherwise stable) 5:57 AM Awaiting psych evaluation at this time   Labs Review Labs Reviewed  CBC WITH DIFFERENTIAL - Abnormal; Notable for the following:    RBC 3.65 (*)    Hemoglobin 10.7 (*)    HCT 32.9 (*)    Monocytes Relative 14 (*)    All other components within normal limits  BASIC METABOLIC PANEL -  Abnormal; Notable for the following:    BUN 36 (*)    Creatinine, Ser 2.28 (*)    GFR calc non Af Amer 31 (*)    GFR calc Af Amer 37 (*)    All other components within normal limits  ETHANOL  URINE RAPID DRUG SCREEN (HOSP PERFORMED)  URINALYSIS, ROUTINE W REFLEX MICROSCOPIC     MDM   Final diagnoses:  Renal insufficiency  Schizoaffective disorder, unspecified type  Suicidal ideation  Essential hypertension    Nursing notes including past medical history and social history reviewed and considered in documentation Labs/vital reviewed and considered     Sharyon Cable, MD 04/07/14 [phone]
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
## 2014-04-07 NOTE — ED Notes (Signed)
Pt co muscle cramps, requesting Aleve. EDP aware.

## 2014-04-07 NOTE — ED Notes (Signed)
Bed: [facility] Expected date: 04/07/14 Expected time:  Means of arrival:  Comments: Hold for [facility] Transfer [place], H

## 2014-04-08 DIAGNOSIS — F259 Schizoaffective disorder, unspecified: Secondary | ICD-10-CM

## 2014-04-08 LAB — CBG MONITORING, ED
GLUCOSE-CAPILLARY: 108 mg/dL — AB (ref 70–99)
Glucose-Capillary: 123 mg/dL — ABNORMAL HIGH (ref 70–99)

## 2014-04-08 MED ORDER — HYDROCODONE-ACETAMINOPHEN 5-325 MG PO TABS
1.0000 | ORAL_TABLET | Freq: Once | ORAL | Status: AC
  Administered 2014-04-08: 1 via ORAL
  Filled 2014-04-08: qty 1

## 2014-04-08 NOTE — Progress Notes (Signed)
CSW called [facility] [employer] to arrange IVC rescind transport home. CSW spoke with correctional worker [place]. She states that an Garment/textile technologist currently in [place] will pick pt up presently. CSW gave [employer] station phone [phone]) if officer calls for questions. CSW to sign off but available for consult.  Rochele Pages,     ED CSW  phone: [phone] 9:44am

## 2014-04-08 NOTE — Consult Note (Signed)
Review of Systems   Constitutional: Negative.    HENT: Negative.    Eyes: Negative.    Respiratory: Negative.    Cardiovascular: Negative.    Gastrointestinal: Negative.    Genitourinary: Negative.    Musculoskeletal: Negative.    Skin: Negative.    Neurological: Negative.    Endo/Heme/Allergies: Negative.    Psychiatric/Behavioral: Negative.
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
## 2014-04-08 NOTE — ED Notes (Signed)
Pt  pacing in hallway

## 2014-04-08 NOTE — ED Notes (Signed)
Pt sitting up eating breakfast.

## 2014-04-09 ENCOUNTER — Encounter (HOSPITAL_COMMUNITY): Payer: Self-pay | Admitting: Emergency Medicine

## 2014-04-09 ENCOUNTER — Emergency Department (HOSPITAL_COMMUNITY)
Admission: EM | Admit: 2014-04-09 | Discharge: 2014-04-09 | Disposition: A | Discharge status: Home / Self Care | Payer: Medicare Other | Attending: Emergency Medicine | Admitting: Emergency Medicine

## 2014-04-09 DIAGNOSIS — Z7982 Long term (current) use of aspirin: Secondary | ICD-10-CM | POA: Insufficient documentation

## 2014-04-09 DIAGNOSIS — F22 Delusional disorders: Secondary | ICD-10-CM

## 2014-04-09 DIAGNOSIS — I10 Essential (primary) hypertension: Secondary | ICD-10-CM

## 2014-04-09 DIAGNOSIS — F17200 Nicotine dependence, unspecified, uncomplicated: Secondary | ICD-10-CM | POA: Insufficient documentation

## 2014-04-09 DIAGNOSIS — E119 Type 2 diabetes mellitus without complications: Secondary | ICD-10-CM | POA: Insufficient documentation

## 2014-04-09 DIAGNOSIS — Z046 Encounter for general psychiatric examination, requested by authority: Secondary | ICD-10-CM | POA: Insufficient documentation

## 2014-04-09 DIAGNOSIS — E876 Hypokalemia: Secondary | ICD-10-CM | POA: Insufficient documentation

## 2014-04-09 DIAGNOSIS — F319 Bipolar disorder, unspecified: Secondary | ICD-10-CM | POA: Insufficient documentation

## 2014-04-09 DIAGNOSIS — N289 Disorder of kidney and ureter, unspecified: Secondary | ICD-10-CM

## 2014-04-09 DIAGNOSIS — IMO0002 Reserved for concepts with insufficient information to code with codable children: Secondary | ICD-10-CM | POA: Insufficient documentation

## 2014-04-09 DIAGNOSIS — F259 Schizoaffective disorder, unspecified: Secondary | ICD-10-CM

## 2014-04-09 DIAGNOSIS — F200 Paranoid schizophrenia: Secondary | ICD-10-CM | POA: Diagnosis not present

## 2014-04-09 DIAGNOSIS — Z79899 Other long term (current) drug therapy: Secondary | ICD-10-CM | POA: Insufficient documentation

## 2014-04-09 LAB — CBC WITH DIFFERENTIAL/PLATELET
BASOS ABS: 0 10*3/uL (ref 0.0–0.1)
Basophils Relative: 1 % (ref 0–1)
Eosinophils Absolute: 0.2 10*3/uL (ref 0.0–0.7)
Eosinophils Relative: 4 % (ref 0–5)
HCT: 31 % — ABNORMAL LOW (ref 39.0–52.0)
Hemoglobin: 10 g/dL — ABNORMAL LOW (ref 13.0–17.0)
LYMPHS ABS: 2.6 10*3/uL (ref 0.7–4.0)
LYMPHS PCT: 47 % — AB (ref 12–46)
MCH: 29.2 pg (ref 26.0–34.0)
MCHC: 32.3 g/dL (ref 30.0–36.0)
MCV: 90.4 fL (ref 78.0–100.0)
Monocytes Absolute: 0.5 10*3/uL (ref 0.1–1.0)
Monocytes Relative: 9 % (ref 3–12)
Neutro Abs: 2.1 10*3/uL (ref 1.7–7.7)
Neutrophils Relative %: 39 % — ABNORMAL LOW (ref 43–77)
PLATELETS: 333 10*3/uL (ref 150–400)
RBC: 3.43 MIL/uL — AB (ref 4.22–5.81)
RDW: 15.2 % (ref 11.5–15.5)
WBC: 5.4 10*3/uL (ref 4.0–10.5)

## 2014-04-09 LAB — BASIC METABOLIC PANEL
ANION GAP: 13 (ref 5–15)
BUN: 33 mg/dL — ABNORMAL HIGH (ref 6–23)
CALCIUM: 8.9 mg/dL (ref 8.4–10.5)
CO2: 24 meq/L (ref 19–32)
Chloride: 104 mEq/L (ref 96–112)
Creatinine, Ser: 2.13 mg/dL — ABNORMAL HIGH (ref 0.50–1.35)
GFR calc Af Amer: 40 mL/min — ABNORMAL LOW (ref >90)
GFR calc non Af Amer: 34 mL/min — ABNORMAL LOW (ref >90)
Glucose, Bld: 101 mg/dL — ABNORMAL HIGH (ref 70–99)
POTASSIUM: 3.3 meq/L — AB (ref 3.7–5.3)
Sodium: 141 mEq/L (ref 137–147)

## 2014-04-09 LAB — RAPID URINE DRUG SCREEN, HOSP PERFORMED
Amphetamines: NOT DETECTED
BENZODIAZEPINES: NOT DETECTED
Barbiturates: NOT DETECTED
COCAINE: NOT DETECTED
Opiates: NOT DETECTED
Tetrahydrocannabinol: NOT DETECTED

## 2014-04-09 LAB — ETHANOL

## 2014-04-09 LAB — VALPROIC ACID LEVEL: Valproic Acid Lvl: <10 ug/mL — ABNORMAL LOW (ref 50.0–100.0)

## 2014-04-09 MED ORDER — ASPIRIN EC 81 MG PO TBEC
81.0000 mg | DELAYED_RELEASE_TABLET | Freq: Every day | ORAL | Status: DC
  Administered 2014-04-09: 81 mg via ORAL
  Filled 2014-04-09: qty 1

## 2014-04-09 MED ORDER — LORAZEPAM 1 MG PO TABS: 1.0000 mg | ORAL_TABLET | ORAL | Status: DC | PRN

## 2014-04-09 MED ORDER — DIVALPROEX SODIUM 250 MG PO DR TAB
500.0000 mg | DELAYED_RELEASE_TABLET | Freq: Two times a day (BID) | ORAL | Status: DC
  Administered 2014-04-09 (×2): 500 mg via ORAL
  Filled 2014-04-09 (×2): qty 2

## 2014-04-09 MED ORDER — AMLODIPINE BESYLATE 5 MG PO TABS
5.0000 mg | ORAL_TABLET | Freq: Every day | ORAL | Status: DC
  Administered 2014-04-09: 5 mg via ORAL
  Filled 2014-04-09: qty 1

## 2014-04-09 MED ORDER — RISPERIDONE 0.5 MG PO TABS
0.5000 mg | ORAL_TABLET | Freq: Two times a day (BID) | ORAL | Status: DC
  Administered 2014-04-09 (×2): 0.5 mg via ORAL
  Filled 2014-04-09 (×4): qty 1

## 2014-04-09 MED ORDER — POTASSIUM CHLORIDE CRYS ER 20 MEQ PO TBCR
20.0000 meq | EXTENDED_RELEASE_TABLET | Freq: Once | ORAL | Status: AC
  Administered 2014-04-09: 20 meq via ORAL
  Filled 2014-04-09: qty 1

## 2014-04-09 MED ORDER — RISPERIDONE 0.5 MG PO TABS
ORAL_TABLET | ORAL | Status: AC
  Filled 2014-04-09: qty 1

## 2014-04-09 MED ORDER — BENZTROPINE MESYLATE 1 MG PO TABS
1.0000 mg | ORAL_TABLET | Freq: Every day | ORAL | Status: DC
  Administered 2014-04-09: 1 mg via ORAL
  Filled 2014-04-09: qty 1

## 2014-04-09 MED ORDER — CLONIDINE HCL 0.1 MG PO TABS
0.1000 mg | ORAL_TABLET | Freq: Two times a day (BID) | ORAL | Status: DC
  Administered 2014-04-09 (×2): 0.1 mg via ORAL
  Filled 2014-04-09 (×2): qty 1

## 2014-04-09 NOTE — ED Notes (Signed)
EDP aware of pt v/s. EDP reported okay to continue with d/c. Pt asymptomatic. nad noted.

## 2014-04-09 NOTE — ED Notes (Signed)
Pt. To have TTS at 0515
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
## 2014-04-09 NOTE — Progress Notes (Signed)
Writer received collateral information from the social worker Charolette Forward [phone]).

## 2014-04-09 NOTE — ED Notes (Addendum)
EDP reported concerns to [facility] regarding pt disposition. EDP going forward with d/c and rescinding IVC. Agricultural consultant and Unit AD aware.

## 2014-04-09 NOTE — ED Notes (Signed)
New order for TTS ordered for extender to re-evaluate.

## 2014-04-09 NOTE — ED Notes (Signed)
Per Randall Hiss, pt to be d/c per most recent telepsych findings. EDP contact information given and [facility] to call EDP.
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
## 2014-04-09 NOTE — ED Notes (Signed)
Called [facility], Tom reported that pt is to be transferred to [facility]. Tom reported for EDP to call [facility] ED to facilitate transfer. Charge RN aware and reported to call [facility] charge RN to see if bed available for pt.

## 2014-04-09 NOTE — ED Provider Notes (Signed)
psyc recommends to discharge the pt home.  I told the concerns Dr. Christy Gentles had about discharging the pt,  But it was felt by psyc that the pt did not require admissions      Maudry Diego, MD 04/09/14 1416

## 2014-04-09 NOTE — ED Notes (Signed)
Per Dr. Christy Gentles,  If pt. Is not recommended for inpatient treatment after tele-psych, then pt. Will be admitted here at [facility]. Relayed this info with Judson Roch at [employer] point.

## 2014-04-09 NOTE — ED Notes (Signed)
Pt. Continues to sit at the end of bed with arms crossed. Pt. Calm at this time.

## 2014-04-09 NOTE — Progress Notes (Signed)
Per Manus Gunning, patient will be assessed by an extender in the morning for a final disposition. Writer informed the nurse Estill Bamberg.

## 2014-04-09 NOTE — Discharge Instructions (Signed)
Follow up with out pt tx

## 2014-04-09 NOTE — ED Notes (Signed)
Spoke with Luevenia Maxin, pt.'s Education officer, museum. She expressed concerns about the possibility of pt. Papers being rescinded. Requesting to speak with assesser from [facility]. Gave Ava from [facility] social workers number. [name] can be reached at [phone]
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
## 2014-04-09 NOTE — ED Notes (Signed)
Telepsych being completed.  

## 2014-04-09 NOTE — ED Notes (Signed)
Called [facility] ED and spoke with Chad,RN. [place] reported that they had one remaining bed open but that [facility] was transferring a pt. [place] reported another pt up for d/c in that unit and that was waiting for transport. [place] reported to call back in approximately 1 hour for update to see status of beds. [facility] Charge RN aware. No new orders given.

## 2014-04-09 NOTE — ED Notes (Signed)
Pt given a phone to use to call for a ride.

## 2014-04-09 NOTE — Consult Note (Signed)
[facility] Face-to-Face Psychiatry Consult   Reason for Consult:  Schizophrenia Referring Physician:  EDP  Ivan [name] is an 53 y.o. male. Total Time spent with patient: 20 minutes  Assessment: AXIS I:  Chronic Paranoid Schizophrenia AXIS II:  Deferred AXIS III:   Past Medical History  Diagnosis Date  . Diabetes mellitus   . Hypertension   . Bipolar 1 disorder   . Schizoaffective disorder   . Hypokalemia 04/02/2013  . Edema of both legs 04/07/2013   AXIS IV:  other psychosocial or environmental problems, problems related to social environment and problems with primary support group AXIS V:  61-70 mild symptoms  Plan:  No evidence of imminent risk to self or others at present.    Subjective:   Ivan [name] is a 53 y.o. male patient does not warrant admission.  HPI:  The patient denies suicidal/homicidal ideations, hallucinations, and drug/alcohol abuse.  He has been calm and cooperative in the ED.  Long history of family discord.  No indications for inpatient hospitalization needed at this time.  According to reports/notes, the family wants to institutionalize the patient.  He is not meeting criteria at this time. HPI Elements:  NA  Past Psychiatric History: Past Medical History  Diagnosis Date  . Diabetes mellitus   . Hypertension   . Bipolar 1 disorder   . Schizoaffective disorder   . Hypokalemia 04/02/2013  . Edema of both legs 04/07/2013    reports that he has been smoking Cigarettes and Cigars.  He has been smoking about 0.50 packs per day. He has never used smokeless tobacco. He reports that he drinks alcohol. He reports that he does not use illicit drugs. History reviewed. No pertinent family history.         Allergies:   Allergies  Allergen Reactions  . Bee Venom Swelling    ACT Assessment Complete:  Yes:    Educational Status    Risk to Self: Risk to self with the past 6 months Is patient at risk for suicide?: No Substance abuse history and/or treatment for  substance abuse?: No  Risk to Others:    Abuse:    Prior Inpatient Therapy:    Prior Outpatient Therapy:    Additional Information:                    Objective: Blood pressure 149/77, pulse 43, temperature 98.4 F (36.9 C), resp. rate 18, height 6' (1.829 m), weight 300 lb (136.079 kg), SpO2 100.00%.Body mass index is 40.68 kg/(m^2). Results for orders placed during the hospital encounter of 04/09/14 (from the past 72 hour(s))  URINE RAPID DRUG SCREEN (HOSP PERFORMED)     Status: None   Collection Time    04/09/14 12:30 AM      Result Value Ref Range   Opiates NONE DETECTED  NONE DETECTED   Cocaine NONE DETECTED  NONE DETECTED   Benzodiazepines NONE DETECTED  NONE DETECTED   Amphetamines NONE DETECTED  NONE DETECTED   Tetrahydrocannabinol NONE DETECTED  NONE DETECTED   Barbiturates NONE DETECTED  NONE DETECTED   Comment:            DRUG SCREEN FOR MEDICAL PURPOSES     ONLY.  IF CONFIRMATION IS NEEDED     FOR ANY PURPOSE, NOTIFY LAB     WITHIN 5 DAYS.                LOWEST DETECTABLE LIMITS     FOR URINE DRUG SCREEN  Drug Class       Cutoff (ng/mL)     Amphetamine      1000     Barbiturate      200     Benzodiazepine   993     Tricyclics       716     Opiates          300     Cocaine          300     THC              50  BASIC METABOLIC PANEL     Status: Abnormal   Collection Time    04/09/14 12:33 AM      Result Value Ref Range   Sodium 141  137 - 147 mEq/L   Potassium 3.3 (*) 3.7 - 5.3 mEq/L   Chloride 104  96 - 112 mEq/L   CO2 24  19 - 32 mEq/L   Glucose, Bld 101 (*) 70 - 99 mg/dL   BUN 33 (*) 6 - 23 mg/dL   Creatinine, Ser 2.13 (*) 0.50 - 1.35 mg/dL   Calcium 8.9  8.4 - 10.5 mg/dL   GFR calc non Af Amer 34 (*) >90 mL/min   GFR calc Af Amer 40 (*) >90 mL/min   Comment: (NOTE)     The eGFR has been calculated using the CKD EPI equation.     This calculation has not been validated in all clinical situations.     eGFR's persistently <90 mL/min  signify possible Chronic Kidney     Disease.   Anion gap 13  5 - 15  CBC WITH DIFFERENTIAL     Status: Abnormal   Collection Time    04/09/14 12:33 AM      Result Value Ref Range   WBC 5.4  4.0 - 10.5 K/uL   RBC 3.43 (*) 4.22 - 5.81 MIL/uL   Hemoglobin 10.0 (*) 13.0 - 17.0 g/dL   HCT 31.0 (*) 39.0 - 52.0 %   MCV 90.4  78.0 - 100.0 fL   MCH 29.2  26.0 - 34.0 pg   MCHC 32.3  30.0 - 36.0 g/dL   RDW 15.2  11.5 - 15.5 %   Platelets 333  150 - 400 K/uL   Neutrophils Relative % 39 (*) 43 - 77 %   Neutro Abs 2.1  1.7 - 7.7 K/uL   Lymphocytes Relative 47 (*) 12 - 46 %   Lymphs Abs 2.6  0.7 - 4.0 K/uL   Monocytes Relative 9  3 - 12 %   Monocytes Absolute 0.5  0.1 - 1.0 K/uL   Eosinophils Relative 4  0 - 5 %   Eosinophils Absolute 0.2  0.0 - 0.7 K/uL   Basophils Relative 1  0 - 1 %   Basophils Absolute 0.0  0.0 - 0.1 K/uL  ETHANOL     Status: None   Collection Time    04/09/14 12:33 AM      Result Value Ref Range   Alcohol, Ethyl (B) <11  0 - 11 mg/dL   Comment:            LOWEST DETECTABLE LIMIT FOR     SERUM ALCOHOL IS 11 mg/dL     FOR MEDICAL PURPOSES ONLY  VALPROIC ACID LEVEL     Status: Abnormal   Collection Time    04/09/14 12:57 AM      Result Value Ref Range   Valproic  Acid Lvl <10.0 (*) 50.0 - 100.0 ug/mL   Labs are reviewed and are pertinent for no medical issues.  Current Facility-Administered Medications  Medication Dose Route Frequency Provider Last Rate Last Dose  . amLODipine (NORVASC) tablet 5 mg  5 mg Oral Daily Sharyon Cable, MD   5 mg at 04/09/14 0957  . aspirin EC tablet 81 mg  81 mg Oral Daily Sharyon Cable, MD   81 mg at 04/09/14 0957  . benztropine (COGENTIN) tablet 1 mg  1 mg Oral Daily Sharyon Cable, MD   1 mg at 04/09/14 0956  . cloNIDine (CATAPRES) tablet 0.1 mg  0.1 mg Oral BID Sharyon Cable, MD   0.1 mg at 04/09/14 0956  . divalproex (DEPAKOTE) DR tablet 500 mg  500 mg Oral BID Sharyon Cable, MD   500 mg at 04/09/14 0957  .  LORazepam (ATIVAN) tablet 1 mg  1 mg Oral Q4H PRN Sharyon Cable, MD      . risperiDONE (RISPERDAL) tablet 0.5 mg  0.5 mg Oral BID Sharyon Cable, MD   0.5 mg at 04/09/14 3244   Current Outpatient Prescriptions  Medication Sig Dispense Refill  . amLODipine (NORVASC) 5 MG tablet Take 1 tablet (5 mg total) by mouth 2 (two) times daily.      Marland Kitchen aspirin EC 81 MG tablet Take 81 mg by mouth daily.      . benztropine (COGENTIN) 2 MG tablet Take 1 mg by mouth daily.      . cloNIDine (CATAPRES) 0.1 MG tablet Take 1 tablet (0.1 mg total) by mouth 2 (two) times daily.  60 tablet  11  . divalproex (DEPAKOTE) 500 MG DR tablet Take 500 mg by mouth 2 (two) times daily.      . furosemide (LASIX) 40 MG tablet Take 1 tablet (40 mg total) by mouth 2 (two) times daily.  30 tablet    . gabapentin (NEURONTIN) 400 MG capsule Take 400 mg by mouth 3 (three) times daily.      Marland Kitchen glipiZIDE (GLUCOTROL) 5 MG tablet Take 5 mg by mouth daily before breakfast.      . hydrALAZINE (APRESOLINE) 100 MG tablet Take 1 tablet (100 mg total) by mouth 3 (three) times daily.  90 tablet  0  . labetalol (NORMODYNE) 200 MG tablet Take 1 tablet (200 mg total) by mouth 2 (two) times daily.      . potassium chloride (K-DUR) 10 MEQ tablet Take 1 tablet (10 mEq total) by mouth 2 (two) times daily.      . risperiDONE (RISPERDAL) 0.5 MG tablet Take 0.5 mg by mouth 2 (two) times daily.      . Vitamin D, Ergocalciferol, (DRISDOL) 50000 UNITS CAPS capsule Take 50,000 Units by mouth every 21 ( twenty-one) days.        Psychiatric Specialty Exam:     Blood pressure 149/77, pulse 43, temperature 98.4 F (36.9 C), resp. rate 18, height 6' (1.829 m), weight 300 lb (136.079 kg), SpO2 100.00%.Body mass index is 40.68 kg/(m^2).  General Appearance: Casual  Eye Contact::  Good  Speech:  Normal Rate  Volume:  Normal  Mood:  Euthymic  Affect:  Congruent  Thought Process:  Coherent  Orientation:  Full (Time, Place, and Person)  Thought Content:   WDL  Suicidal Thoughts:  No  Homicidal Thoughts:  No  Memory:  Immediate;   Good Recent;   Good Remote;   Good  Judgement:  Good  Insight:  Good  Psychomotor Activity:  Normal  Concentration:  Good  Recall:  Good  Fund of Knowledge:Good  Language: Good  Akathisia:  No  Handed:  Right  AIMS (if indicated):     Assets:  Financial Resources/Insurance Housing Leisure Time Physical Health Resilience  Sleep:      Musculoskeletal: Strength & Muscle Tone: within normal limits Gait & Station: normal Patient leans: N/A  Treatment Plan Summary: Discharge home and follow-up with his regular provider if any concerns arise.  Waylan Boga, PMH-NP 04/09/2014 5:25 PM

## 2014-04-11 ENCOUNTER — Emergency Department (HOSPITAL_COMMUNITY)
Admission: EM | Admit: 2014-04-11 | Discharge: 2014-04-12 | Disposition: A | Discharge status: Other Acute Inpatient Hospital | Payer: 59 | Source: Home / Self Care | Attending: Emergency Medicine | Admitting: Emergency Medicine

## 2014-04-11 ENCOUNTER — Encounter (HOSPITAL_COMMUNITY): Payer: Self-pay | Admitting: Emergency Medicine

## 2014-04-11 DIAGNOSIS — F319 Bipolar disorder, unspecified: Secondary | ICD-10-CM | POA: Insufficient documentation

## 2014-04-11 DIAGNOSIS — Z91199 Patient's noncompliance with other medical treatment and regimen due to unspecified reason: Secondary | ICD-10-CM

## 2014-04-11 DIAGNOSIS — R011 Cardiac murmur, unspecified: Secondary | ICD-10-CM | POA: Insufficient documentation

## 2014-04-11 DIAGNOSIS — Z79899 Other long term (current) drug therapy: Secondary | ICD-10-CM

## 2014-04-11 DIAGNOSIS — F17200 Nicotine dependence, unspecified, uncomplicated: Secondary | ICD-10-CM | POA: Diagnosis present

## 2014-04-11 DIAGNOSIS — I10 Essential (primary) hypertension: Secondary | ICD-10-CM | POA: Insufficient documentation

## 2014-04-11 DIAGNOSIS — Z046 Encounter for general psychiatric examination, requested by authority: Secondary | ICD-10-CM

## 2014-04-11 DIAGNOSIS — Z7982 Long term (current) use of aspirin: Secondary | ICD-10-CM

## 2014-04-11 DIAGNOSIS — F39 Unspecified mood [affective] disorder: Secondary | ICD-10-CM | POA: Diagnosis present

## 2014-04-11 DIAGNOSIS — F250 Schizoaffective disorder, bipolar type: Secondary | ICD-10-CM

## 2014-04-11 DIAGNOSIS — Z9119 Patient's noncompliance with other medical treatment and regimen: Secondary | ICD-10-CM

## 2014-04-11 DIAGNOSIS — G8929 Other chronic pain: Secondary | ICD-10-CM | POA: Diagnosis present

## 2014-04-11 DIAGNOSIS — I129 Hypertensive chronic kidney disease with stage 1 through stage 4 chronic kidney disease, or unspecified chronic kidney disease: Secondary | ICD-10-CM | POA: Diagnosis present

## 2014-04-11 DIAGNOSIS — F489 Nonpsychotic mental disorder, unspecified: Secondary | ICD-10-CM | POA: Insufficient documentation

## 2014-04-11 DIAGNOSIS — F200 Paranoid schizophrenia: Principal | ICD-10-CM | POA: Diagnosis present

## 2014-04-11 DIAGNOSIS — N189 Chronic kidney disease, unspecified: Secondary | ICD-10-CM | POA: Diagnosis present

## 2014-04-11 DIAGNOSIS — M549 Dorsalgia, unspecified: Secondary | ICD-10-CM | POA: Diagnosis present

## 2014-04-11 DIAGNOSIS — E119 Type 2 diabetes mellitus without complications: Secondary | ICD-10-CM | POA: Diagnosis present

## 2014-04-11 DIAGNOSIS — F259 Schizoaffective disorder, unspecified: Secondary | ICD-10-CM | POA: Insufficient documentation

## 2014-04-11 DIAGNOSIS — F411 Generalized anxiety disorder: Secondary | ICD-10-CM | POA: Diagnosis present

## 2014-04-11 LAB — RAPID URINE DRUG SCREEN, HOSP PERFORMED
AMPHETAMINES: NOT DETECTED
Barbiturates: NOT DETECTED
Benzodiazepines: NOT DETECTED
COCAINE: NOT DETECTED
OPIATES: NOT DETECTED
TETRAHYDROCANNABINOL: NOT DETECTED

## 2014-04-11 LAB — COMPREHENSIVE METABOLIC PANEL
ALT: 54 U/L — AB (ref 0–53)
AST: 70 U/L — AB (ref 0–37)
Albumin: 3.4 g/dL — ABNORMAL LOW (ref 3.5–5.2)
Alkaline Phosphatase: 125 U/L — ABNORMAL HIGH (ref 39–117)
Anion gap: 12 (ref 5–15)
BUN: 21 mg/dL (ref 6–23)
CO2: 25 meq/L (ref 19–32)
Calcium: 8.9 mg/dL (ref 8.4–10.5)
Chloride: 107 mEq/L (ref 96–112)
Creatinine, Ser: 1.69 mg/dL — ABNORMAL HIGH (ref 0.50–1.35)
GFR calc Af Amer: 52 mL/min — ABNORMAL LOW (ref >90)
GFR, EST NON AFRICAN AMERICAN: 45 mL/min — AB (ref >90)
Glucose, Bld: 100 mg/dL — ABNORMAL HIGH (ref 70–99)
Potassium: 3.3 mEq/L — ABNORMAL LOW (ref 3.7–5.3)
SODIUM: 144 meq/L (ref 137–147)
TOTAL PROTEIN: 7.4 g/dL (ref 6.0–8.3)
Total Bilirubin: 0.3 mg/dL (ref 0.3–1.2)

## 2014-04-11 LAB — CBC
HCT: 31.9 % — ABNORMAL LOW (ref 39.0–52.0)
Hemoglobin: 10.5 g/dL — ABNORMAL LOW (ref 13.0–17.0)
MCH: 29.7 pg (ref 26.0–34.0)
MCHC: 32.9 g/dL (ref 30.0–36.0)
MCV: 90.1 fL (ref 78.0–100.0)
PLATELETS: 340 10*3/uL (ref 150–400)
RBC: 3.54 MIL/uL — AB (ref 4.22–5.81)
RDW: 15.2 % (ref 11.5–15.5)
WBC: 5.8 10*3/uL (ref 4.0–10.5)

## 2014-04-11 LAB — ETHANOL: Alcohol, Ethyl (B): <11 mg/dL (ref 0–11)

## 2014-04-11 LAB — CBG MONITORING, ED: Glucose-Capillary: 116 mg/dL — ABNORMAL HIGH (ref 70–99)

## 2014-04-11 LAB — ACETAMINOPHEN LEVEL: Acetaminophen (Tylenol), Serum: <15 ug/mL (ref 10–30)

## 2014-04-11 LAB — SALICYLATE LEVEL: Salicylate Lvl: <2 mg/dL — ABNORMAL LOW (ref 2.8–20.0)

## 2014-04-11 MED ORDER — ONDANSETRON HCL 4 MG PO TABS: 4.0000 mg | ORAL_TABLET | Freq: Three times a day (TID) | ORAL | Status: DC | PRN

## 2014-04-11 MED ORDER — ASPIRIN EC 81 MG PO TBEC
81.0000 mg | DELAYED_RELEASE_TABLET | Freq: Every day | ORAL | Status: DC
  Administered 2014-04-11 – 2014-04-12 (×2): 81 mg via ORAL
  Filled 2014-04-11 (×2): qty 1

## 2014-04-11 MED ORDER — LABETALOL HCL 200 MG PO TABS
200.0000 mg | ORAL_TABLET | Freq: Two times a day (BID) | ORAL | Status: DC
  Administered 2014-04-12: 200 mg via ORAL
  Filled 2014-04-11 (×4): qty 1

## 2014-04-11 MED ORDER — HYDRALAZINE HCL 25 MG PO TABS
ORAL_TABLET | ORAL | Status: AC
  Filled 2014-04-11: qty 4

## 2014-04-11 MED ORDER — ALUM & MAG HYDROXIDE-SIMETH 200-200-20 MG/5ML PO SUSP: 30.0000 mL | ORAL | Status: DC | PRN

## 2014-04-11 MED ORDER — FUROSEMIDE 40 MG PO TABS
40.0000 mg | ORAL_TABLET | Freq: Two times a day (BID) | ORAL | Status: DC
  Administered 2014-04-11 – 2014-04-12 (×2): 40 mg via ORAL
  Filled 2014-04-11 (×2): qty 1

## 2014-04-11 MED ORDER — IBUPROFEN 400 MG PO TABS: 600.0000 mg | ORAL_TABLET | Freq: Three times a day (TID) | ORAL | Status: DC | PRN

## 2014-04-11 MED ORDER — GABAPENTIN 400 MG PO CAPS
400.0000 mg | ORAL_CAPSULE | Freq: Three times a day (TID) | ORAL | Status: DC
  Administered 2014-04-11 – 2014-04-12 (×4): 400 mg via ORAL
  Filled 2014-04-11 (×4): qty 1

## 2014-04-11 MED ORDER — ACETAMINOPHEN 325 MG PO TABS: 650.0000 mg | ORAL_TABLET | ORAL | Status: DC | PRN

## 2014-04-11 MED ORDER — RISPERIDONE 0.5 MG PO TABS
0.5000 mg | ORAL_TABLET | Freq: Two times a day (BID) | ORAL | Status: DC
  Administered 2014-04-12: 0.5 mg via ORAL
  Filled 2014-04-11 (×4): qty 1

## 2014-04-11 MED ORDER — CLONIDINE HCL 0.1 MG PO TABS
0.1000 mg | ORAL_TABLET | Freq: Two times a day (BID) | ORAL | Status: DC
  Administered 2014-04-11 – 2014-04-12 (×2): 0.1 mg via ORAL
  Filled 2014-04-11 (×2): qty 1

## 2014-04-11 MED ORDER — HYDRALAZINE HCL 25 MG PO TABS
100.0000 mg | ORAL_TABLET | Freq: Three times a day (TID) | ORAL | Status: DC
  Administered 2014-04-11 – 2014-04-12 (×2): 100 mg via ORAL
  Filled 2014-04-11 (×2): qty 4
  Filled 2014-04-11 (×2): qty 2
  Filled 2014-04-11 (×3): qty 4
  Filled 2014-04-11 (×2): qty 2
  Filled 2014-04-11: qty 4

## 2014-04-11 MED ORDER — NICOTINE 21 MG/24HR TD PT24
21.0000 mg | MEDICATED_PATCH | Freq: Every day | TRANSDERMAL | Status: DC
  Filled 2014-04-11: qty 1

## 2014-04-11 MED ORDER — HYDRALAZINE HCL 100 MG PO TABS: 100.0000 mg | ORAL_TABLET | Freq: Three times a day (TID) | ORAL | Status: DC

## 2014-04-11 MED ORDER — DIVALPROEX SODIUM 250 MG PO DR TAB
500.0000 mg | DELAYED_RELEASE_TABLET | Freq: Two times a day (BID) | ORAL | Status: DC
  Administered 2014-04-11 – 2014-04-12 (×2): 500 mg via ORAL
  Filled 2014-04-11 (×2): qty 2

## 2014-04-11 MED ORDER — ZOLPIDEM TARTRATE 5 MG PO TABS: 5.0000 mg | ORAL_TABLET | Freq: Every evening | ORAL | Status: DC | PRN

## 2014-04-11 MED ORDER — VITAMIN D (ERGOCALCIFEROL) 1.25 MG (50000 UNIT) PO CAPS: 50000.0000 [IU] | ORAL_CAPSULE | ORAL | Status: DC

## 2014-04-11 MED ORDER — GLIPIZIDE 5 MG PO TABS
5.0000 mg | ORAL_TABLET | Freq: Every day | ORAL | Status: DC
  Administered 2014-04-12: 5 mg via ORAL
  Filled 2014-04-11 (×3): qty 1

## 2014-04-11 MED ORDER — AMLODIPINE BESYLATE 5 MG PO TABS
5.0000 mg | ORAL_TABLET | Freq: Two times a day (BID) | ORAL | Status: DC
  Administered 2014-04-12: 5 mg via ORAL
  Filled 2014-04-11 (×2): qty 1

## 2014-04-11 MED ORDER — BENZTROPINE MESYLATE 1 MG PO TABS
1.0000 mg | ORAL_TABLET | Freq: Every day | ORAL | Status: DC
  Administered 2014-04-11 – 2014-04-12 (×2): 1 mg via ORAL
  Filled 2014-04-11 (×2): qty 1

## 2014-04-11 MED ORDER — POTASSIUM CHLORIDE ER 10 MEQ PO TBCR
10.0000 meq | EXTENDED_RELEASE_TABLET | Freq: Two times a day (BID) | ORAL | Status: DC
  Administered 2014-04-12: 10 meq via ORAL
  Filled 2014-04-11 (×6): qty 1

## 2014-04-11 NOTE — ED Notes (Signed)
Pt returns to er with [facility] for evaluation of IVC, when asked why pt is here, pt states "same old stuff as before, family problems", [facility] advised that pt was petitioned by family due to pt seeing people in residence that were not there. Pt denies any complaints, denies any visual or auditory hallucinations, denies any SI or HI, [facility] reports that this is pt';s third trip to er for IVC this week,
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
## 2014-04-11 NOTE — BH Assessment (Signed)
Per Adbiodun at [facility] pt is declined due to aggression.   Lear Ng, [facility] Triage Specialist 04/11/2014 10:08 PM

## 2014-04-11 NOTE — ED Notes (Signed)
[place] [facility]- [phone] Erlene Senters- pt's sister- [phone]
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
## 2014-04-11 NOTE — Progress Notes (Signed)
Pt's referral has been faxed to the following facilities:  [facility]- per Susie can fax [facility]- per Juliann Pulse beds available [facility]- per Deedra can fax [facility]- per Mariam can fax for wait list [facility] [place]- per Joya San can fax  In addition the following facilities contacted are at capacity:  [place] per Caryl Pina  [facility]- per [facility]- per Danny Lawless- per [facility]- per [facility]- per Mariane Baumgarten- per Percival Spanish- per Milinda Antis- per [facility]- per [facility]- per [facility]- per Roderic Palau only 1 bed available for developmentally disabled pt at this time  [facility] Disposition MHT

## 2014-04-12 ENCOUNTER — Encounter (HOSPITAL_COMMUNITY): Payer: Self-pay | Admitting: Emergency Medicine

## 2014-04-12 ENCOUNTER — Emergency Department (HOSPITAL_COMMUNITY)
Admission: EM | Admit: 2014-04-12 | Discharge: 2014-04-13 | Disposition: A | Discharge status: Home / Self Care | Payer: 59 | Source: Home / Self Care | Attending: Emergency Medicine | Admitting: Emergency Medicine

## 2014-04-12 DIAGNOSIS — Z79899 Other long term (current) drug therapy: Secondary | ICD-10-CM | POA: Insufficient documentation

## 2014-04-12 DIAGNOSIS — F319 Bipolar disorder, unspecified: Secondary | ICD-10-CM

## 2014-04-12 DIAGNOSIS — E119 Type 2 diabetes mellitus without complications: Secondary | ICD-10-CM

## 2014-04-12 DIAGNOSIS — E876 Hypokalemia: Secondary | ICD-10-CM | POA: Insufficient documentation

## 2014-04-12 DIAGNOSIS — I10 Essential (primary) hypertension: Secondary | ICD-10-CM | POA: Insufficient documentation

## 2014-04-12 DIAGNOSIS — Z7982 Long term (current) use of aspirin: Secondary | ICD-10-CM | POA: Insufficient documentation

## 2014-04-12 DIAGNOSIS — F209 Schizophrenia, unspecified: Secondary | ICD-10-CM

## 2014-04-12 DIAGNOSIS — Z046 Encounter for general psychiatric examination, requested by authority: Secondary | ICD-10-CM

## 2014-04-12 DIAGNOSIS — F17200 Nicotine dependence, unspecified, uncomplicated: Secondary | ICD-10-CM

## 2014-04-12 DIAGNOSIS — IMO0002 Reserved for concepts with insufficient information to code with codable children: Secondary | ICD-10-CM | POA: Insufficient documentation

## 2014-04-12 LAB — CBG MONITORING, ED: Glucose-Capillary: 106 mg/dL — ABNORMAL HIGH (ref 70–99)

## 2014-04-12 MED ORDER — VITAMIN D (ERGOCALCIFEROL) 1.25 MG (50000 UNIT) PO CAPS: 50000.0000 [IU] | ORAL_CAPSULE | ORAL | Status: DC

## 2014-04-12 NOTE — BH Assessment (Signed)
[facility] - denied due to aggression, per Mateo Flow.  Boyce Medici, MA  Disposition MHT

## 2014-04-12 NOTE — BH Assessment (Signed)
[name], [facility] at [facility], confirms adult unit is currently at capacity. Contacted the following facilities for placement:  BED AVAILABLE, PT UNDER REVIEW: [facility], per [facility], per [place]: [facility], per Oswaldo Done, per [facility], per [facility], per [facility], per [facility], per [facility], per [facility], per [facility], per [facility], per [facility], per [place], per [facility], per [facility]  NO RESPONSE: [facility]  PT DECLINED: [place], [place], [facility] Triage Specialist [phone]

## 2014-04-12 NOTE — ED Notes (Signed)
Pt here from [facility], he was transferred there from [facility], his IVC papers were not complete and needed to come to have them completed. Pt has already been cleared at [facility].

## 2014-04-12 NOTE — ED Notes (Signed)
MD at bedside. 

## 2014-04-12 NOTE — BH Assessment (Signed)
Pt's referral is currently under review at [facility], per Maudie Mercury.  Boyce Medici, MA  Disposition MHT

## 2014-04-13 ENCOUNTER — Encounter (HOSPITAL_COMMUNITY): Payer: Self-pay | Admitting: *Deleted

## 2014-04-13 ENCOUNTER — Inpatient Hospital Stay (HOSPITAL_COMMUNITY)
Admission: AD | Admit: 2014-04-13 | Discharge: 2014-04-20 | DRG: 885 | Disposition: A | Discharge status: Home / Self Care | Payer: 59 | Source: Intra-hospital | Attending: Psychiatry | Admitting: Psychiatry

## 2014-04-13 DIAGNOSIS — F200 Paranoid schizophrenia: Secondary | ICD-10-CM

## 2014-04-13 DIAGNOSIS — I10 Essential (primary) hypertension: Secondary | ICD-10-CM

## 2014-04-13 DIAGNOSIS — M549 Dorsalgia, unspecified: Secondary | ICD-10-CM | POA: Diagnosis present

## 2014-04-13 DIAGNOSIS — Z9119 Patient's noncompliance with other medical treatment and regimen: Secondary | ICD-10-CM | POA: Diagnosis not present

## 2014-04-13 DIAGNOSIS — N189 Chronic kidney disease, unspecified: Secondary | ICD-10-CM | POA: Diagnosis present

## 2014-04-13 DIAGNOSIS — Z91148 Patient's other noncompliance with medication regimen for other reason: Secondary | ICD-10-CM

## 2014-04-13 DIAGNOSIS — R600 Localized edema: Secondary | ICD-10-CM

## 2014-04-13 DIAGNOSIS — Z79899 Other long term (current) drug therapy: Secondary | ICD-10-CM | POA: Diagnosis not present

## 2014-04-13 DIAGNOSIS — F17200 Nicotine dependence, unspecified, uncomplicated: Secondary | ICD-10-CM | POA: Diagnosis present

## 2014-04-13 DIAGNOSIS — F319 Bipolar disorder, unspecified: Secondary | ICD-10-CM | POA: Diagnosis present

## 2014-04-13 DIAGNOSIS — Z91199 Patient's noncompliance with other medical treatment and regimen due to unspecified reason: Secondary | ICD-10-CM | POA: Diagnosis not present

## 2014-04-13 DIAGNOSIS — G8929 Other chronic pain: Secondary | ICD-10-CM | POA: Diagnosis present

## 2014-04-13 DIAGNOSIS — E119 Type 2 diabetes mellitus without complications: Secondary | ICD-10-CM | POA: Diagnosis present

## 2014-04-13 DIAGNOSIS — F39 Unspecified mood [affective] disorder: Secondary | ICD-10-CM | POA: Diagnosis present

## 2014-04-13 DIAGNOSIS — F209 Schizophrenia, unspecified: Secondary | ICD-10-CM | POA: Diagnosis present

## 2014-04-13 DIAGNOSIS — I129 Hypertensive chronic kidney disease with stage 1 through stage 4 chronic kidney disease, or unspecified chronic kidney disease: Secondary | ICD-10-CM | POA: Diagnosis present

## 2014-04-13 DIAGNOSIS — Z9114 Patient's other noncompliance with medication regimen: Secondary | ICD-10-CM

## 2014-04-13 DIAGNOSIS — F411 Generalized anxiety disorder: Secondary | ICD-10-CM | POA: Diagnosis present

## 2014-04-13 DIAGNOSIS — Z7982 Long term (current) use of aspirin: Secondary | ICD-10-CM | POA: Diagnosis not present

## 2014-04-13 LAB — HEMOGLOBIN A1C
HEMOGLOBIN A1C: 6.3 % — AB (ref <5.7)
MEAN PLASMA GLUCOSE: 134 mg/dL — AB (ref <117)

## 2014-04-13 LAB — GLUCOSE, CAPILLARY
Glucose-Capillary: 96 mg/dL (ref 70–99)
Glucose-Capillary: 90 mg/dL (ref 70–99)
Glucose-Capillary: 75 mg/dL (ref 70–99)
Glucose-Capillary: 119 mg/dL — ABNORMAL HIGH (ref 70–99)
Glucose-Capillary: 116 mg/dL — ABNORMAL HIGH (ref 70–99)

## 2014-04-13 MED ORDER — BENZTROPINE MESYLATE 0.5 MG PO TABS
0.5000 mg | ORAL_TABLET | Freq: Every day | ORAL | Status: DC
Start: 2014-04-13 — End: 2014-04-14
  Administered 2014-04-13: 0.5 mg via ORAL
  Filled 2014-04-13 (×2): qty 1

## 2014-04-13 MED ORDER — BENZTROPINE MESYLATE 1 MG PO TABS
1.0000 mg | ORAL_TABLET | Freq: Every day | ORAL | Status: DC
  Administered 2014-04-13: 1 mg via ORAL
  Filled 2014-04-13 (×3): qty 1

## 2014-04-13 MED ORDER — GLIPIZIDE 5 MG PO TABS
5.0000 mg | ORAL_TABLET | Freq: Every day | ORAL | Status: DC
  Administered 2014-04-13 – 2014-04-20 (×8): 5 mg via ORAL
  Filled 2014-04-13 (×11): qty 1

## 2014-04-13 MED ORDER — RISPERIDONE 0.5 MG PO TABS
0.5000 mg | ORAL_TABLET | Freq: Two times a day (BID) | ORAL | Status: DC
  Administered 2014-04-13: 0.5 mg via ORAL
  Filled 2014-04-13 (×5): qty 1

## 2014-04-13 MED ORDER — ALUM & MAG HYDROXIDE-SIMETH 200-200-20 MG/5ML PO SUSP: 30.0000 mL | ORAL | Status: DC | PRN

## 2014-04-13 MED ORDER — LABETALOL HCL 200 MG PO TABS
200.0000 mg | ORAL_TABLET | Freq: Two times a day (BID) | ORAL | Status: DC
  Administered 2014-04-13 – 2014-04-20 (×16): 200 mg via ORAL
  Filled 2014-04-13 (×19): qty 1

## 2014-04-13 MED ORDER — AMLODIPINE BESYLATE 5 MG PO TABS
5.0000 mg | ORAL_TABLET | Freq: Two times a day (BID) | ORAL | Status: DC
  Administered 2014-04-13 – 2014-04-20 (×15): 5 mg via ORAL
  Filled 2014-04-13 (×21): qty 1

## 2014-04-13 MED ORDER — OLANZAPINE 10 MG IM SOLR: 5.0000 mg | Freq: Two times a day (BID) | INTRAMUSCULAR | Status: DC | PRN

## 2014-04-13 MED ORDER — LAMOTRIGINE 25 MG PO TABS
25.0000 mg | ORAL_TABLET | Freq: Every evening | ORAL | Status: DC
  Administered 2014-04-13 – 2014-04-18 (×6): 25 mg via ORAL
  Filled 2014-04-13 (×8): qty 1

## 2014-04-13 MED ORDER — NICOTINE POLACRILEX 2 MG MT GUM
2.0000 mg | CHEWING_GUM | OROMUCOSAL | Status: DC | PRN
  Administered 2014-04-13 – 2014-04-20 (×24): 2 mg via ORAL
  Filled 2014-04-13 (×16): qty 1

## 2014-04-13 MED ORDER — CARBAMAZEPINE ER 200 MG PO TB12
200.0000 mg | ORAL_TABLET | Freq: Two times a day (BID) | ORAL | Status: DC
  Filled 2014-04-13 (×2): qty 1

## 2014-04-13 MED ORDER — OLANZAPINE 5 MG PO TBDP
5.0000 mg | ORAL_TABLET | Freq: Every day | ORAL | Status: DC
  Administered 2014-04-13: 5 mg via ORAL
  Filled 2014-04-13 (×3): qty 1

## 2014-04-13 MED ORDER — ASPIRIN EC 81 MG PO TBEC
81.0000 mg | DELAYED_RELEASE_TABLET | Freq: Every day | ORAL | Status: DC
  Administered 2014-04-13 – 2014-04-20 (×8): 81 mg via ORAL
  Filled 2014-04-13 (×10): qty 1

## 2014-04-13 MED ORDER — HYDRALAZINE HCL 50 MG PO TABS
100.0000 mg | ORAL_TABLET | Freq: Three times a day (TID) | ORAL | Status: DC
  Administered 2014-04-13 – 2014-04-20 (×24): 100 mg via ORAL
  Filled 2014-04-13 (×28): qty 2

## 2014-04-13 MED ORDER — POTASSIUM CHLORIDE CRYS ER 20 MEQ PO TBCR
20.0000 meq | EXTENDED_RELEASE_TABLET | Freq: Two times a day (BID) | ORAL | Status: AC
  Administered 2014-04-13 – 2014-04-14 (×4): 20 meq via ORAL
  Filled 2014-04-13 (×4): qty 1

## 2014-04-13 MED ORDER — INSULIN ASPART 100 UNIT/ML ~~LOC~~ SOLN
0.0000 [IU] | Freq: Three times a day (TID) | SUBCUTANEOUS | Status: DC
  Administered 2014-04-16 – 2014-04-18 (×5): 3 [IU] via SUBCUTANEOUS
  Administered 2014-04-19: 4 [IU] via SUBCUTANEOUS
  Administered 2014-04-20: 3 [IU] via SUBCUTANEOUS

## 2014-04-13 MED ORDER — OLANZAPINE 10 MG PO TBDP
5.0000 mg | ORAL_TABLET | Freq: Two times a day (BID) | ORAL | Status: DC | PRN
  Administered 2014-04-15: 5 mg via ORAL

## 2014-04-13 MED ORDER — GABAPENTIN 400 MG PO CAPS
400.0000 mg | ORAL_CAPSULE | Freq: Three times a day (TID) | ORAL | Status: DC
  Administered 2014-04-13 – 2014-04-20 (×24): 400 mg via ORAL
  Filled 2014-04-13: qty 9
  Filled 2014-04-13 (×5): qty 1
  Filled 2014-04-13: qty 9
  Filled 2014-04-13 (×3): qty 1
  Filled 2014-04-13: qty 9
  Filled 2014-04-13 (×10): qty 1
  Filled 2014-04-13 (×2): qty 9
  Filled 2014-04-13 (×8): qty 1
  Filled 2014-04-13: qty 9
  Filled 2014-04-13: qty 1

## 2014-04-13 MED ORDER — VITAMIN D (ERGOCALCIFEROL) 1.25 MG (50000 UNIT) PO CAPS
50000.0000 [IU] | ORAL_CAPSULE | ORAL | Status: DC
  Filled 2014-04-13: qty 1

## 2014-04-13 MED ORDER — TRAMADOL HCL 50 MG PO TABS: 50.0000 mg | ORAL_TABLET | Freq: Two times a day (BID) | ORAL | Status: DC

## 2014-04-13 MED ORDER — POTASSIUM CHLORIDE ER 10 MEQ PO TBCR: 10.0000 meq | EXTENDED_RELEASE_TABLET | Freq: Two times a day (BID) | ORAL | Status: DC

## 2014-04-13 MED ORDER — BENZTROPINE MESYLATE 0.5 MG PO TABS
0.5000 mg | ORAL_TABLET | Freq: Two times a day (BID) | ORAL | Status: DC
  Filled 2014-04-13 (×4): qty 1

## 2014-04-13 MED ORDER — TRAZODONE HCL 50 MG PO TABS
50.0000 mg | ORAL_TABLET | Freq: Every evening | ORAL | Status: DC | PRN
  Filled 2014-04-13 (×4): qty 1

## 2014-04-13 MED ORDER — CLONIDINE HCL 0.1 MG PO TABS
0.1000 mg | ORAL_TABLET | Freq: Two times a day (BID) | ORAL | Status: DC
  Administered 2014-04-13 – 2014-04-20 (×15): 0.1 mg via ORAL
  Filled 2014-04-13 (×3): qty 1
  Filled 2014-04-13: qty 6
  Filled 2014-04-13 (×2): qty 1
  Filled 2014-04-13: qty 6
  Filled 2014-04-13 (×3): qty 1
  Filled 2014-04-13: qty 6
  Filled 2014-04-13 (×3): qty 1
  Filled 2014-04-13: qty 6
  Filled 2014-04-13 (×6): qty 1

## 2014-04-13 MED ORDER — INSULIN ASPART 100 UNIT/ML ~~LOC~~ SOLN
0.0000 [IU] | Freq: Every day | SUBCUTANEOUS | Status: DC
Start: 2014-04-13 — End: 2014-04-21

## 2014-04-13 MED ORDER — TRAMADOL HCL 50 MG PO TABS: 50.0000 mg | ORAL_TABLET | Freq: Two times a day (BID) | ORAL | Status: AC | PRN

## 2014-04-13 MED ORDER — TRAZODONE HCL 100 MG PO TABS
100.0000 mg | ORAL_TABLET | Freq: Every day | ORAL | Status: DC
  Administered 2014-04-13 – 2014-04-19 (×7): 100 mg via ORAL
  Filled 2014-04-13 (×5): qty 1
  Filled 2014-04-13: qty 3
  Filled 2014-04-13 (×3): qty 1
  Filled 2014-04-13: qty 3
  Filled 2014-04-13 (×2): qty 1

## 2014-04-13 MED ORDER — DIVALPROEX SODIUM 500 MG PO DR TAB
500.0000 mg | DELAYED_RELEASE_TABLET | Freq: Two times a day (BID) | ORAL | Status: DC
  Administered 2014-04-13: 500 mg via ORAL
  Filled 2014-04-13 (×5): qty 1

## 2014-04-13 MED ORDER — DICLOFENAC SODIUM 1 % TD GEL
2.0000 g | Freq: Four times a day (QID) | TRANSDERMAL | Status: DC
  Administered 2014-04-13 – 2014-04-20 (×22): 2 g via TOPICAL
  Filled 2014-04-13: qty 100

## 2014-04-13 MED ORDER — OLANZAPINE 10 MG IM SOLR
5.0000 mg | Freq: Every day | INTRAMUSCULAR | Status: DC
  Filled 2014-04-13 (×2): qty 10

## 2014-04-13 MED ORDER — MAGNESIUM HYDROXIDE 400 MG/5ML PO SUSP: 30.0000 mL | Freq: Every day | ORAL | Status: DC | PRN

## 2014-04-13 MED ORDER — BENZTROPINE MESYLATE 1 MG/ML IJ SOLN
0.5000 mg | Freq: Every day | INTRAMUSCULAR | Status: DC
  Filled 2014-04-13 (×2): qty 0.5

## 2014-04-13 MED ORDER — FUROSEMIDE 40 MG PO TABS
40.0000 mg | ORAL_TABLET | Freq: Two times a day (BID) | ORAL | Status: DC
  Administered 2014-04-13 – 2014-04-20 (×15): 40 mg via ORAL
  Filled 2014-04-13 (×19): qty 1

## 2014-04-13 MED ORDER — RISPERIDONE 1 MG PO TABS
1.0000 mg | ORAL_TABLET | Freq: Two times a day (BID) | ORAL | Status: DC
  Filled 2014-04-13 (×4): qty 1

## 2014-04-13 MED ORDER — ACETAMINOPHEN 325 MG PO TABS
650.0000 mg | ORAL_TABLET | Freq: Four times a day (QID) | ORAL | Status: DC | PRN
Start: 2014-04-13 — End: 2014-04-21
  Administered 2014-04-15 – 2014-04-19 (×5): 650 mg via ORAL
  Filled 2014-04-13 (×6): qty 2

## 2014-04-13 NOTE — BHH Suicide Risk Assessment (Signed)
   Nursing information obtained from:    Demographic factors:    Current Mental Status:    Loss Factors:    Historical Factors:    Risk Reduction Factors:    Total Time spent with patient: 20 minutes  CLINICAL FACTORS:   Schizophrenia:   Paranoid or undifferentiated type  Psychiatric Specialty Exam: Physical Exam  Constitutional: He appears well-developed and well-nourished.  Psychiatric: His speech is normal. His mood appears anxious. His affect is angry, labile and inappropriate. He is agitated, aggressive and combative. Thought content is paranoid and delusional. Cognition and memory are normal. He expresses impulsivity and inappropriate judgment. He is inattentive.    Review of Systems  Unable to perform ROS: mental acuity    Blood pressure 132/86, pulse 68, temperature 97.9 F (36.6 C), temperature source Oral, resp. rate 18, height 6' 1.5" (1.867 m), weight 137.44 kg (303 lb).Body mass index is 39.43 kg/(m^2).  General Appearance: Disheveled  Eye Contact::  Poor  Speech:  Normal Rate and loud  Volume:  Increased  Mood:  Angry, Anxious and Irritable  Affect:  Inappropriate and Labile  Thought Process:  Disorganized and Irrelevant  Orientation:  Other:  oriented to person ,place and situation  Thought Content:  Paranoid Ideation and patient is not cooperative but per IVC petition patient is paranoid,aggressive threatening to destroy prroperty with bulldozer  Suicidal Thoughts:  Did not express,uncooperative  Homicidal Thoughts:  Per IVC petition patient is aggressive ,threatening to destroy property with a bulldozer  Memory:  Immediate;   unable to assess secondary to patients mental acuity Recent;   unable to assess Remote;   unable to assess  Judgement:  Impaired  Insight:  Lacking  Psychomotor Activity:  Increased  Concentration:  poor  Recall:  Poor  Fund of Knowledge:Poor  Language: Fair  Akathisia:  No  Handed:  unknown  AIMS (if indicated):   0  Assets:   Others:  access to health care  Sleep:  Number of Hours: 0.5   Musculoskeletal: Strength & Muscle Tone: within normal limits Gait & Station: normal Patient leans: N/A  COGNITIVE FEATURES THAT CONTRIBUTE TO RISK:  Closed-mindedness Polarized thinking Thought constriction (tunnel vision)    SUICIDE RISK:   Mild:  Suicidal ideation of limited frequency, intensity, duration, and specificity.  There are no identifiable plans, no associated intent, mild dysphoria and related symptoms, good self-control (both objective and subjective assessment), few other risk factors, and identifiable protective factors, including available and accessible social support. (however patient is not cooperative )  PLAN OF CARE:  I certify that inpatient services furnished can reasonably be expected to improve the patient's condition.  Ivan [name] 04/13/2014, 11:46 AM

## 2014-04-13 NOTE — Consult Note (Signed)
Case discussed, patient does not meet inpatient criteria and can be discharged

## 2014-04-13 NOTE — Progress Notes (Signed)
Nutrition Brief Note  Patient identified on the Malnutrition Screening Tool (MST) Report  Wt Readings from Last 10 Encounters:  04/13/14 303 lb (137.44 kg)  04/11/14 300 lb (136.079 kg)  04/09/14 300 lb (136.079 kg)  04/07/14 300 lb (136.079 kg)  03/23/13 300 lb (136.079 kg)  02/28/13 285 lb (129.275 kg)  01/08/13 301 lb (136.533 kg)  01/07/13 311 lb (141.069 kg)  10/11/11 281 lb (127.461 kg)    Body mass index is 39.43 kg/(m^2). Patient meets criteria for class II obesity based on current BMI.   Current diet order is CHO modified and pt is also offered choice of unit snacks mid-morning and mid-afternoon.  Pt is eating as desired.    Admitted with paranoid behavior, long hx of mental illness including paranoid schizophrenia with multiple admissions to mental institutes.   Attempted multiple times to speak with pt however he was sound asleep. Nursing reports pt eating 100% of meals. Weight stable PTA. Nursing aware to consult RD if nutrition needs identified.   Carlis Stable MS, [place], LDN [phone] Pager [phone] Weekend/After Hours Pager

## 2014-04-13 NOTE — Progress Notes (Signed)
Pt alert and oriented. Pt has flat affect. Pt reports back pain. MD made aware. Pt did not attend group this morning. Pt took meds and ate lunch and has tolerated both well. Pt denies any SI/HI/AH/VH. Pt has been compliant with medications and follows most commands. Pt contracts for safety. Pt remains safe and appropriate in the milieu. RN will continue to monitor

## 2014-04-13 NOTE — Progress Notes (Signed)
The focus of this group is to educate the patient on the purpose and policies of crisis stabilization and provide a format to answer questions about their admission.  The group details unit policies and expectations of patients while admitted. Patient did not attend.

## 2014-04-13 NOTE — Tx Team (Signed)
Initial Interdisciplinary Treatment Plan   PATIENT STRESSORS: Financial difficulties Legal issue Marital or family conflict Medication change or noncompliance Occupational concerns   PROBLEM LIST: Problem List/Patient Goals Date to be addressed Date deferred Reason deferred Estimated date of resolution  "Don't pester"      Increased risk for suicide 04/13/14     Alteration in thought process 04/13/14                                          DISCHARGE CRITERIA:  Ability to meet basic life and health needs Adequate post-discharge living arrangements Improved stabilization in mood, thinking, and/or behavior Medical problems require only outpatient monitoring Motivation to continue treatment in a less acute level of care Need for constant or close observation no longer present Reduction of life-threatening or endangering symptoms to within safe limits Safe-care adequate arrangements made Verbal commitment to aftercare and medication compliance  PRELIMINARY DISCHARGE PLAN: Outpatient therapy Participate in family therapy Return to previous living arrangement  PATIENT/FAMIILY INVOLVEMENT: This treatment plan has been presented to and reviewed with the patient, KNOWLEDGE BAQUERO, and/or family member.  The patient and family have been given the opportunity to ask questions and make suggestions.  Wynonia Hazard Laverne 04/13/2014, 2:01 AM

## 2014-04-13 NOTE — Progress Notes (Signed)
D PT. Denies SI and HI, no complaints of pain or discomfort noted.   Pt. Denies A and VH.  A Writer offered support and encouragement,   Discussed pt's day with him.  R Pt. Remains safe on the unit,  Pt. Takes his scheduled medications without issue.  CBG was 119, no insulin required.  Pt. Denied pain until writer discussed the Voltaren with him then admitted he has some pain in his hand and wrist which was relieved with the Voltaren.  Pt. Has very little input, he answers with yes  Or no when possible, he appears withdrawn at times.

## 2014-04-13 NOTE — Tx Team (Signed)
  Interdisciplinary Treatment Plan Update   Date Reviewed:  04/13/2014  Time Reviewed:  1:05 PM  Progress in Treatment:   Attending groups: No Participating in groups: No Taking medication as prescribed: Yes  Tolerating medication: Yes Family/Significant other contact made: No  Patient understands diagnosis: No  Limited insight Discussing patient identified problems/goals with staff: Yes  See initial care plan Medical problems stabilized or resolved: Yes Denies suicidal/homicidal ideation: Yes  In tx team Patient has not harmed self or others: Yes  For review of initial/current patient goals, please see plan of care.  Estimated Length of Stay:  4-5 days  Reason for Continuation of Hospitalization: Medication stabilization Other; describe Paranoia, Mood instability  New Problems/Goals identified:  N/A  Discharge Plan or Barriers:   return home, follow up outpt  Additional Comments:  " I have family problems".  : Ivan [name] is a 53 year old AAM, divorced and lives alone. He has a long history of mental illness dating back to 2001. He was diagnosed with Paranoid schizophrenia and has had several admissions to mental institutes, the last one at [facility] being in 03/2013 when he was discharged on Invega.. Patient was brought in this time ,IVCed by family for paranoid behavior . Patient on evaluation appears to be very labile,aggressive,combative ,unwilling to cooperate . Patient reports back pain and hence would not sit down for an evaluation. Assessor agreed to evaluate him  standing ,however he was unwilling to cooperate even then.  Per IVC petition patient has been non compliant on medications,not going to his appointments with his psychiatrist at [facility]. He also has been very paranoid and threatening to crush everything with a bull dozer. Family worried since his father owns a Doctor, general practice.   Attendees:  Signature: Steva Colder, MD 04/13/2014 1:05 PM   Signature: Ripley Fraise, LCSW 04/13/2014  1:05 PM  Signature: Elmarie Shiley, NP 04/13/2014 1:05 PM  Signature: Mayra Neer, RN 04/13/2014 1:05 PM  Signature: Darrol Angel, RN 04/13/2014 1:05 PM  Signature:  04/13/2014 1:05 PM  Signature:   04/13/2014 1:05 PM  Signature:    Signature:    Signature:    Signature:    Signature:    Signature:      Scribe for Treatment Team:   Ripley Fraise, LCSW  04/13/2014 1:05 PM

## 2014-04-13 NOTE — BHH Group Notes (Signed)
Adult Psychoeducational Group Note  Date:  04/13/2014 Time:  8:54 PM  Group Topic/Focus:  Wrap-Up Group:   The focus of this group is to help patients review their daily goal of treatment and discuss progress on daily workbooks.  Participation Level:  Minimal  Participation Quality:  Drowsy  Affect:  Sleepy  Cognitive:  Appropriate  Insight: Lacking  Engagement in Group:  Limited  Modes of Intervention:  Discussion  Additional Comments:  Ivan [name] was drowsy and sleepy throughout group.  He stated that he took his medications, ate and napped throughout the day.  [name] A 04/13/2014, 8:54 PM

## 2014-04-13 NOTE — H&P (Addendum)
Psychiatric Admission Assessment Adult  Patient Identification:  Ivan [name] Date of Evaluation:  04/13/2014 Chief Complaint: " I have family problems".   History of Present Illness:: Ivan [name] is a 53 year old  AAM, divorced and lives alone. He has a long history of mental illness dating back to 2001. He was diagnosed with Paranoid schizophrenia and has had several admissions to mental institutes, the last one at [facility] being in 03/2013 when he was discharged on Invega.. Patient was brought in this time ,IVCed by family for paranoid behavior . Patient on evaluation appears to be very labile,aggressive,combative ,unwilling to cooperate . Patient reports back pain and hence would not sit down for an evaluation and hence majority of the eval was done with patient standing ,however he was unwilling to cooperate even then. Per IVC petition patient has been non compliant on medications,not going to his appointments with his psychiatrist at [facility]. He also has been very paranoid and threatening to crush everything with a bull dozer. Family worried since his father owns a Doctor, general practice.  Patient is anxious,inpain ,aggressive ,agitated and delusional.  Elements:  Location:  psychosis,aggressive,agitated,non compliant on meds. Quality:  paranoid that there are camers watching him .Has been threatening family that he would use a bull dozer to destroy everything. Severity:  severe. Timing:  constant. Duration:  since the past few days worsening. Context:  has schizophrenia and has been non compliant on medications.. Associated Signs/Synptoms:  Anxiety Symptoms:  Excessive Worry, Psychotic Symptoms:  Delusions, Paranoia, PTSD Symptoms: Negative Total Time spent with patient: 1 hour  Psychiatric Specialty Exam: Physical Exam  Constitutional: He appears well-developed and well-nourished.  Psychiatric: His speech is normal. His mood appears anxious. His affect is angry, labile and inappropriate. He is  agitated, aggressive and combative. Thought content is paranoid and delusional. Cognition and memory are normal. He expresses impulsivity and inappropriate judgment.    Review of Systems  Unable to perform ROS: mental acuity    Blood pressure 132/86, pulse 68, temperature 97.9 F (36.6 C), temperature source Oral, resp. rate 18, height 6' 1.5" (1.867 m), weight 137.44 kg (303 lb).Body mass index is 39.43 kg/(m^2).  General Appearance: Disheveled  Eye Contact::  Minimal  Speech:  limited but normal  rate and loud  Volume:  Increased  Mood:  Angry, Anxious and Irritable  Affect:  Inappropriate and Labile  Thought Process:  Disorganized and Irrelevant  Orientation:  Full (Time, Place, and Person)  Thought Content:  Delusions and Paranoid Ideation  Suicidal Thoughts:  No  Homicidal Thoughts:  No  Memory:  Negative  Judgement:  Impaired  Insight:  Lacking  Psychomotor Activity:  Increased  Concentration:  Poor  Recall:  [place] of Knowledge:Fair  Language: Fair  Akathisia:  No    AIMS (if indicated):     Assets:  Others:  access to health care  Sleep:  Number of Hours: 0.5    Musculoskeletal: Strength & Muscle Tone: within normal limits Gait & Station: normal Patient leans: N/A  Past Psychiatric History: Diagnosis:schizophrenia  Hospitalizations:several,last one in [facility] (2014)  Outpatient Care:[facility]  Substance Abuse Care:Denies  Self-Mutilation:denies  Suicidal Attempts:unknown, patient uncooperative  Violent Behaviors:patient uncooperative   Past Medical History:   Past Medical History  Diagnosis Date  . Diabetes mellitus   . Hypertension   . Bipolar 1 disorder   . Schizoaffective disorder   . Hypokalemia 04/02/2013  . Edema of both legs 04/07/2013   None. Allergies:   Allergies  Allergen Reactions  . Bee Venom Swelling   PTA Medications: Prescriptions prior to admission  Medication Sig Dispense Refill  . amLODipine (NORVASC) 5 MG tablet Take 1 tablet  (5 mg total) by mouth 2 (two) times daily.      Marland Kitchen aspirin EC 81 MG tablet Take 81 mg by mouth daily.      . benztropine (COGENTIN) 2 MG tablet Take 1 mg by mouth daily.      . divalproex (DEPAKOTE) 500 MG DR tablet Take 500 mg by mouth 2 (two) times daily.      . furosemide (LASIX) 40 MG tablet Take 1 tablet (40 mg total) by mouth 2 (two) times daily.  30 tablet    . gabapentin (NEURONTIN) 400 MG capsule Take 400 mg by mouth 3 (three) times daily.      . hydrALAZINE (APRESOLINE) 100 MG tablet Take 1 tablet (100 mg total) by mouth 3 (three) times daily.  90 tablet  0  . risperiDONE (RISPERDAL) 0.5 MG tablet Take 0.5 mg by mouth 2 (two) times daily.      . cloNIDine (CATAPRES) 0.1 MG tablet Take 1 tablet (0.1 mg total) by mouth 2 (two) times daily.  60 tablet  11  . glipiZIDE (GLUCOTROL) 5 MG tablet Take 5 mg by mouth daily before breakfast.      . labetalol (NORMODYNE) 200 MG tablet Take 1 tablet (200 mg total) by mouth 2 (two) times daily.      . potassium chloride (K-DUR) 10 MEQ tablet Take 1 tablet (10 mEq total) by mouth 2 (two) times daily.      . Vitamin D, Ergocalciferol, (DRISDOL) 50000 UNITS CAPS capsule Take 50,000 Units by mouth every 21 ( twenty-one) days.        Previous Psychotropic Medications:  Medication/Dose  Navane,trazodone,invega 9 mg,seroquel 100 mg ,depakote 100 mg daily,ambien               Substance Abuse History in the last 12 months:  No.  Consequences of Substance Abuse: Negative  Social History:  reports that he has been smoking Cigarettes and Cigars.  He has a 19.5 pack-year smoking history. He has never used smokeless tobacco. He reports that he drinks alcohol. He reports that he does not use illicit drugs. Additional Social History: Pain Medications: see nar History of alcohol / drug use?: Yes Longest period of sobriety (when/how long): 6 to 8 months Negative Consequences of Use: Personal relationships;Work / Youth worker Name of Substance 1: etoh 1 -  Age of First Use: 10 to 53 yrs old 1 - Amount (size/oz): 3 to 4 beers 1 - Duration: on going 1 - Last Use / Amount: 2 to 3 months ago                  Current Place of Residence:[place] ,[place] Marital Status:  Divorced Relationships:relational struggles Education:  unknown Glass blower/designer History:  None. Legal History:unknown Hobbies/Interests:none  Family History:  History reviewed. No pertinent family history.  Results for orders placed during the hospital encounter of 04/13/14 (from the past 72 hour(s))  GLUCOSE, CAPILLARY     Status: None   Collection Time    04/13/14  1:49 AM      Result Value Ref Range   Glucose-Capillary 96  70 - 99 mg/dL  GLUCOSE, CAPILLARY     Status: None   Collection Time    04/13/14  5:53 AM      Result Value Ref Range   Glucose-Capillary  90  70 - 99 mg/dL   Comment 1 Notify RN    HEMOGLOBIN A1C     Status: Abnormal   Collection Time    04/13/14  6:15 AM      Result Value Ref Range   Hemoglobin A1C 6.3 (*) <5.7 %   Comment: (NOTE)                                                                               According to the ADA Clinical Practice Recommendations for 2011, when     HbA1c is used as a screening test:      >=6.5%   Diagnostic of Diabetes Mellitus               (if abnormal result is confirmed)     5.7-6.4%   Increased risk of developing Diabetes Mellitus     References:Diagnosis and Classification of Diabetes Mellitus,Diabetes     [id number] 1):S62-S69 and Standards of Medical Care in             Diabetes - 2011,Diabetes Care,2011,34 (Suppl 1):S11-S61.   Mean Plasma Glucose 134 (*) <117 mg/dL   Comment: Performed at [place], [facility]     Status: None   Collection Time    04/13/14 11:46 AM      Result Value Ref Range   Glucose-Capillary 75  70 - 99 mg/dL   Comment 1 Documented in Chart     Comment 2 Notify RN     Psychological  Evaluations:none  Assessment:   DSM5:  Primary psychiatric diagnosis: Schizophrenia,multiple episodes,currently in acute episode  Secondary psychiatric diagnosis: Non compliance with medical treatment  Non psychiatric diagnosis: Diabetes Mellitus Hypertension Chronic back pain Pedal edema    Past Medical History  Diagnosis Date  . Diabetes mellitus   . Hypertension   . Bipolar 1 disorder   . Schizoaffective disorder   . Hypokalemia 04/02/2013  . Edema of both legs 04/07/2013    Treatment Plan/Recommendations:   Patient will benefit from inpatient stay and treatment. Estimated length of stay is 5-7 days. Will  discontinue Risperdal and will start Zyprexa zydis 5 mg po qhs. Also will start Forced medication order since patient appeared to be not cooperative with treatment this AM. Second opinion and forced med order  In chart. Could give IM Zyprexa if patient refuses PO. Will continue cogentin for EPS. Will DC Depakote 2/2 to lack of efficacy and will start lamictal 25 mg po daily. Will continue gabapentin 400 mg po bid which will help with pain as well as mood swings. Will continue trazodone but will increase dose to 100 mg for sleep. Will continue current medication as scheduled for his medical issues. CSW will work on disposition. Will make prns available for agitation.  Treatment Plan Summary: Daily contact with patient to assess and evaluate symptoms and progress in treatment Medication management Current Medications:  Current Facility-Administered Medications  Medication Dose Route Frequency Provider Last Rate Last Dose  . acetaminophen (TYLENOL) tablet 650 mg  650 mg Oral Q6H PRN Laverle Hobby, PA-C      . alum & mag hydroxide-simeth (MAALOX/MYLANTA) 200-200-20 MG/5ML suspension 30 mL  30 mL  Oral Q4H PRN Laverle Hobby, PA-C      . amLODipine (NORVASC) tablet 5 mg  5 mg Oral BID Laverle Hobby, PA-C   5 mg at 04/13/14 [id number]  . aspirin EC tablet 81 mg  81 mg  Oral Daily Laverle Hobby, PA-C   81 mg at 04/13/14 0941  . benztropine (COGENTIN) tablet 0.5 mg  0.5 mg Oral QHS Kirstein Baxley, MD       Or  . benztropine mesylate (COGENTIN) injection 0.5 mg  0.5 mg Intramuscular QHS Everard Interrante, MD      . cloNIDine (CATAPRES) tablet 0.1 mg  0.1 mg Oral BID Laverle Hobby, PA-C   0.1 mg at 04/13/14 0940  . diclofenac sodium (VOLTAREN) 1 % transdermal gel 2 g  2 g Topical QID Irbin Fines, MD      . furosemide (LASIX) tablet 40 mg  40 mg Oral BID Laverle Hobby, PA-C   40 mg at 04/13/14 0941  . gabapentin (NEURONTIN) capsule 400 mg  400 mg Oral TID Laverle Hobby, PA-C   400 mg at 04/13/14 1319  . glipiZIDE (GLUCOTROL) tablet 5 mg  5 mg Oral QAC breakfast Laverle Hobby, PA-C   5 mg at 04/13/14 [id number]  . hydrALAZINE (APRESOLINE) tablet 100 mg  100 mg Oral TID Laverle Hobby, PA-C   100 mg at 04/13/14 1318  . insulin aspart (novoLOG) injection 0-20 Units  0-20 Units Subcutaneous TID WC Spencer E Simon, PA-C      . insulin aspart (novoLOG) injection 0-5 Units  0-5 Units Subcutaneous QHS Spencer E Simon, PA-C      . labetalol (NORMODYNE) tablet 200 mg  200 mg Oral BID Laverle Hobby, PA-C   200 mg at 04/13/14 0941  . lamoTRIgine (LAMICTAL) tablet 25 mg  25 mg Oral QPM Larri Brewton, MD      . magnesium hydroxide (MILK OF MAGNESIA) suspension 30 mL  30 mL Oral Daily PRN Laverle Hobby, PA-C      . nicotine polacrilex (NICORETTE) gum 2 mg  2 mg Oral PRN Mojeed Akintayo      . OLANZapine zydis (ZYPREXA) disintegrating tablet 5 mg  5 mg Oral BID PRN Ursula Alert, MD       Or  . OLANZapine (ZYPREXA) injection 5 mg  5 mg Intramuscular BID PRN Reagen Goates, MD      . OLANZapine zydis (ZYPREXA) disintegrating tablet 5 mg  5 mg Oral QHS Ursula Alert, MD       Or  . OLANZapine (ZYPREXA) injection 5 mg  5 mg Intramuscular QHS Treyvin Glidden, MD      . potassium chloride SA (K-DUR,KLOR-CON) CR tablet 20 mEq  20 mEq Oral BID Laverle Hobby, PA-C   20 mEq  at 04/13/14 0940  . traMADol (ULTRAM) tablet 50 mg  50 mg Oral Q12H PRN Ursula Alert, MD      . traZODone (DESYREL) tablet 100 mg  100 mg Oral QHS Ursula Alert, MD      . Vitamin D (Ergocalciferol) (DRISDOL) capsule 50,000 Units  50,000 Units Oral Q21 days Laverle Hobby, PA-C        Observation Level/Precautions:  1 to 1  Laboratory:  HbAIC               I certify that inpatient services furnished can reasonably be expected to improve the patient's condition.   Torris House 8/25/20153:41 PM

## 2014-04-13 NOTE — BHH Group Notes (Signed)
[place] LCSW Group Therapy  04/13/2014 , 1:03 PM   Type of Therapy:  Group Therapy  Participation Level:  Did not attend  Was standing in hall when group started, and standing in same spot when we ended.  Summary of Progress/Problems: Today's group focused on the term Diagnosis.  Participants were asked to define the term, and then pronounce whether it is a negative, positive or neutral term.  [name] 04/13/2014 , 1:03 PM

## 2014-04-13 NOTE — BHH Counselor (Signed)
Adult Psychosocial Assessment Update Interdisciplinary Team  Previous [facility] admissions/discharges:  Admissions Discharges  Date: Date:  Date: Date:  Date: Date:  Date: Date:  Date: Date:   Changes since the last Psychosocial Assessment (including adherence to outpatient mental health and/or substance abuse treatment, situational issues contributing to decompensation and/or relapse). Ivan [name] presents as paranoid, irritable and impatient.  He refused to answer all the Dr's questions, continually referring her to the chart and telling her he is not going to talk more about that subject, especially if it had to do with family. The record indicates that he has not been going to follow up appointments and has not been taking medications.  He was IVCed by family, which he has been each time he has come in, and he believes he is being set up by them.           Discharge Plan 1. Will you be returning to the same living situation after discharge?   Yes:X  home No:      If no, what is your plan?           2. Would you like a referral for services when you are discharged? Yes:     If yes, for what services?  No:       Pt states he does not want to see a psychiatrist.  We will refer him to [facility] in [place].       Summary and Recommendations (to be completed by the evaluator) Ivan [name] is a 53 YO AA male who has a significant and persistent mental illness.  He is non compliant with meds and follow up appointments.  Plan is to get him on Haldol Decanoate.  In the meantime, he can benefit from crises stabilization, medication management, therapeutic milieu and referral for services.                       Signature:  Trish Mage, 04/13/2014 10:40 AM
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
## 2014-04-14 LAB — GLUCOSE, CAPILLARY
GLUCOSE-CAPILLARY: 71 mg/dL (ref 70–99)
Glucose-Capillary: 105 mg/dL — ABNORMAL HIGH (ref 70–99)
Glucose-Capillary: 101 mg/dL — ABNORMAL HIGH (ref 70–99)
Glucose-Capillary: 104 mg/dL — ABNORMAL HIGH (ref 70–99)

## 2014-04-14 LAB — HEMOGLOBIN A1C
HEMOGLOBIN A1C: 6.3 % — AB (ref <5.7)
Mean Plasma Glucose: 134 mg/dL — ABNORMAL HIGH (ref <117)

## 2014-04-14 MED ORDER — BENZTROPINE MESYLATE 0.5 MG PO TABS
0.5000 mg | ORAL_TABLET | Freq: Two times a day (BID) | ORAL | Status: DC
  Administered 2014-04-14 – 2014-04-15 (×2): 0.5 mg via ORAL
  Filled 2014-04-14 (×4): qty 1

## 2014-04-14 MED ORDER — HALOPERIDOL 5 MG PO TABS
5.0000 mg | ORAL_TABLET | Freq: Two times a day (BID) | ORAL | Status: DC
  Administered 2014-04-14 – 2014-04-15 (×2): 5 mg via ORAL
  Filled 2014-04-14 (×4): qty 1

## 2014-04-14 MED ORDER — BACITRACIN-NEOMYCIN-POLYMYXIN OINTMENT TUBE
TOPICAL_OINTMENT | Freq: Two times a day (BID) | CUTANEOUS | Status: DC
  Administered 2014-04-14: 17:00:00 via TOPICAL
  Administered 2014-04-15 (×2): 15 via TOPICAL
  Administered 2014-04-16: 09:00:00 via TOPICAL
  Administered 2014-04-16 – 2014-04-20 (×7): 15 via TOPICAL
  Filled 2014-04-14: qty 15

## 2014-04-14 MED ORDER — DOUBLE ANTIBIOTIC 500-10000 UNIT/GM EX OINT
TOPICAL_OINTMENT | Freq: Two times a day (BID) | CUTANEOUS | Status: DC
  Filled 2014-04-14 (×15): qty 1

## 2014-04-14 MED ORDER — BENZTROPINE MESYLATE 1 MG/ML IJ SOLN
0.5000 mg | Freq: Two times a day (BID) | INTRAMUSCULAR | Status: DC
  Filled 2014-04-14 (×4): qty 0.5

## 2014-04-14 MED ORDER — HALOPERIDOL LACTATE 5 MG/ML IJ SOLN
5.0000 mg | Freq: Two times a day (BID) | INTRAMUSCULAR | Status: DC
  Filled 2014-04-14 (×4): qty 1

## 2014-04-14 NOTE — BHH Group Notes (Signed)
[facility] LCSW Aftercare Discharge Planning Group Note   04/14/2014 10:44 AM  Participation Quality:  Slept through the entire group    [name], Ivan [name]

## 2014-04-14 NOTE — Progress Notes (Signed)
PT Cancellation/Screen Note  Patient Details Name: Ivan [name] MRN: [id number] DOB: 1960/09/03   Cancelled Treatment:    Reason Eval/Treat Not Completed: PT screened, no needs identified, will sign off-spoke briefly with pt who denied acute back pain during the session/denied need for PT services at this time. Pt did report he has chronic issues with back pain but that pain meds and other modalities ordered by physician are helping manage pain. Since pt was not currently expriencing pain,therapist did not perform any exercises/stretches;avoiding exacerbating pain. Therapist did review exercise/stretch handout (with pt verbally) that pt could try if he should feel the need to in the future. Cautioned him to terminate any stretch/exercise that causes increased pain. Left copy of handout in pt's hard chart and instructed him to make RN aware if he would like to take that handout with him once he discharges. Recommend follow up with outpatient PT/MD if pain persists and/or becomes intolerable. Thanks.  Weston Anna, MPT Pager: [phone]     Weston Anna [facility] 04/14/2014, 2:12 PM

## 2014-04-14 NOTE — Progress Notes (Signed)
[facility] MD Progress Note  04/14/2014 12:41 PM Ivan [name]  MRN:  [id number] Subjective: " I am in pain "  Objective: Patient seen and chart reviewed. Patient continues to be not cooperative ,irritable,delusional and disorganized. Reports being paranoid. Denies any AH/VH/SI today. He continues to have back issues which are currently being managed with pain medication. He reports that these meds are helping him. He reports appetite as fair. Discussed with patient about haldol decanoate since he has a long history of non compliance. However patient reports being tried on it in the past and reports 'that very medication has its side effects".  Per nursing report patient reported that slept well,his bed was changed to mechanical and that appears to have helped him.  Diagnosis:   DSM5:  Primary psychiatric diagnosis:  Schizophrenia,multiple episodes,currently in acute episode   Secondary psychiatric diagnosis:  Non compliance with medical treatment   Non psychiatric diagnosis:  Diabetes Mellitus  Hypertension  Chronic back pain  Pedal edema      ADL's:  Intact  Sleep: Fair  Appetite:  Fair  Suicidal Ideation:  Denies  Homicidal Ideation:  Denies  AEB (as evidenced by):  Psychiatric Specialty Exam: Physical Exam  Constitutional: He is oriented to person, place, and time. He appears well-developed and well-nourished.  HENT:  Head: Normocephalic and atraumatic.  Neurological: He is alert and oriented to person, place, and time.  Skin: Skin is warm and dry.  Laceration to right sided elbow pos-healing    Review of Systems  Musculoskeletal: Positive for back pain.  Neurological: Negative.   Psychiatric/Behavioral: Positive for hallucinations. The patient is nervous/anxious and has insomnia.     Blood pressure 135/69, pulse 65, temperature 98.7 F (37.1 C), temperature source Oral, resp. rate 18, height 6' 1.5" (1.867 m), weight 137.44 kg (303 lb).Body mass index is 39.43  kg/(m^2).  General Appearance: Fairly Groomed  Engineer, water::  Minimal  Speech:  Normal Rate and Slow  Volume:  Decreased  Mood:  Angry, Anxious and Irritable  Affect:  Constricted  Thought Process:  Irrelevant  Orientation:  Full (Time, Place, and Person)  Thought Content:  Delusions and Paranoid Ideation  Suicidal Thoughts:  No  Homicidal Thoughts:  No  Memory:  Negative  Judgement:  Impaired  Insight:  Lacking  Psychomotor Activity:  Decreased  Concentration:  Fair  Recall:  Poor  Fund of Knowledge:Poor  Language: Poor  Akathisia:  No    AIMS (if indicated):   0  Assets:  Others:  access to health care  Sleep:  Number of Hours: 2   Musculoskeletal: Strength & Muscle Tone: within normal limits Gait & Station: normal Patient leans: N/A  Current Medications: Current Facility-Administered Medications  Medication Dose Route Frequency Provider Last Rate Last Dose  . acetaminophen (TYLENOL) tablet 650 mg  650 mg Oral Q6H PRN Laverle Hobby, PA-C      . alum & mag hydroxide-simeth (MAALOX/MYLANTA) 200-200-20 MG/5ML suspension 30 mL  30 mL Oral Q4H PRN Laverle Hobby, PA-C      . amLODipine (NORVASC) tablet 5 mg  5 mg Oral BID Laverle Hobby, PA-C   5 mg at 04/14/14 0750  . aspirin EC tablet 81 mg  81 mg Oral Daily Laverle Hobby, PA-C   81 mg at 04/14/14 0749  . benztropine (COGENTIN) tablet 0.5 mg  0.5 mg Oral BID Ursula Alert, MD       Or  . benztropine mesylate (COGENTIN) injection 0.5 mg  0.5  mg Intramuscular BID Ursula Alert, MD      . cloNIDine (CATAPRES) tablet 0.1 mg  0.1 mg Oral BID Laverle Hobby, PA-C   0.1 mg at 04/14/14 0749  . diclofenac sodium (VOLTAREN) 1 % transdermal gel 2 g  2 g Topical QID Ursula Alert, MD   2 g at 04/14/14 0751  . furosemide (LASIX) tablet 40 mg  40 mg Oral BID Laverle Hobby, PA-C   40 mg at 04/14/14 0750  . gabapentin (NEURONTIN) capsule 400 mg  400 mg Oral TID Laverle Hobby, PA-C   400 mg at 04/14/14 1111  . glipiZIDE  (GLUCOTROL) tablet 5 mg  5 mg Oral QAC breakfast Laverle Hobby, PA-C   5 mg at 04/14/14 [id number]  . haloperidol (HALDOL) tablet 5 mg  5 mg Oral BID Ursula Alert, MD       Or  . haloperidol lactate (HALDOL) injection 5 mg  5 mg Intramuscular BID Ursula Alert, MD      . hydrALAZINE (APRESOLINE) tablet 100 mg  100 mg Oral TID Laverle Hobby, PA-C   100 mg at 04/14/14 1111  . insulin aspart (novoLOG) injection 0-20 Units  0-20 Units Subcutaneous TID WC Spencer E Simon, PA-C      . insulin aspart (novoLOG) injection 0-5 Units  0-5 Units Subcutaneous QHS Spencer E Simon, PA-C      . labetalol (NORMODYNE) tablet 200 mg  200 mg Oral BID Laverle Hobby, PA-C   200 mg at 04/14/14 0750  . lamoTRIgine (LAMICTAL) tablet 25 mg  25 mg Oral QPM Teriann Livingood, MD   25 mg at 04/13/14 1643  . magnesium hydroxide (MILK OF MAGNESIA) suspension 30 mL  30 mL Oral Daily PRN Laverle Hobby, PA-C      . nicotine polacrilex (NICORETTE) gum 2 mg  2 mg Oral PRN Mojeed Akintayo   2 mg at 04/13/14 2202  . OLANZapine zydis (ZYPREXA) disintegrating tablet 5 mg  5 mg Oral BID PRN Ursula Alert, MD       Or  . OLANZapine (ZYPREXA) injection 5 mg  5 mg Intramuscular BID PRN Ursula Alert, MD      . potassium chloride SA (K-DUR,KLOR-CON) CR tablet 20 mEq  20 mEq Oral BID Laverle Hobby, PA-C   20 mEq at 04/14/14 0750  . traMADol (ULTRAM) tablet 50 mg  50 mg Oral Q12H PRN Ursula Alert, MD      . traZODone (DESYREL) tablet 100 mg  100 mg Oral QHS Ursula Alert, MD   100 mg at 04/13/14 2159  . Vitamin D (Ergocalciferol) (DRISDOL) capsule 50,000 Units  50,000 Units Oral Q21 days Laverle Hobby, PA-C        Lab Results:  Results for orders placed during the hospital encounter of 04/13/14 (from the past 48 hour(s))  GLUCOSE, CAPILLARY     Status: None   Collection Time    04/13/14  1:49 AM      Result Value Ref Range   Glucose-Capillary 96  70 - 99 mg/dL  GLUCOSE, CAPILLARY     Status: None   Collection Time     04/13/14  5:53 AM      Result Value Ref Range   Glucose-Capillary 90  70 - 99 mg/dL   Comment 1 Notify RN    HEMOGLOBIN A1C     Status: Abnormal   Collection Time    04/13/14  6:15 AM      Result Value Ref Range  Hemoglobin A1C 6.3 (*) <5.7 %   Comment: (NOTE)                                                                               According to the ADA Clinical Practice Recommendations for 2011, when     HbA1c is used as a screening test:      >=6.5%   Diagnostic of Diabetes Mellitus               (if abnormal result is confirmed)     5.7-6.4%   Increased risk of developing Diabetes Mellitus     References:Diagnosis and Classification of Diabetes Mellitus,Diabetes     [id number] 1):S62-S69 and Standards of Medical Care in             Diabetes - 2011,Diabetes [id number] (Suppl 1):S11-S61.   Mean Plasma Glucose 134 (*) <117 mg/dL   Comment: Performed at [facility], CAPILLARY     Status: None   Collection Time    04/13/14 11:46 AM      Result Value Ref Range   Glucose-Capillary 75  70 - 99 mg/dL   Comment 1 Documented in Chart     Comment 2 Notify RN    GLUCOSE, CAPILLARY     Status: Abnormal   Collection Time    04/13/14  4:29 PM      Result Value Ref Range   Glucose-Capillary 119 (*) 70 - 99 mg/dL   Comment 1 Documented in Chart     Comment 2 Notify RN    HEMOGLOBIN A1C     Status: Abnormal   Collection Time    04/13/14  7:30 PM      Result Value Ref Range   Hemoglobin A1C 6.3 (*) <5.7 %   Comment: (NOTE)                                                                               According to the ADA Clinical Practice Recommendations for 2011, when     HbA1c is used as a screening test:      >=6.5%   Diagnostic of Diabetes Mellitus               (if abnormal result is confirmed)     5.7-6.4%   Increased risk of developing Diabetes Mellitus     References:Diagnosis and Classification of Diabetes Mellitus,Diabetes      [id number] 1):S62-S69 and Standards of Medical Care in             Diabetes - 2011,Diabetes Care,2011,34 (Suppl 1):S11-S61.   Mean Plasma Glucose 134 (*) <117 mg/dL   Comment: Performed at [place], CAPILL[place]     Status: Abnormal   Collection Time    04/13/14  8:33 PM      Result Value Ref Range   Glucose-Capillary 116 (*) 70 - 99 mg/dL  GLUCOSE, CAPILLARY     Status: Abnormal   Collection Time    04/14/14  6:08 AM      Result Value Ref Range   Glucose-Capillary 105 (*) 70 - 99 mg/dL  GLUCOSE, CAPILLARY     Status: None   Collection Time    04/14/14 11:55 AM      Result Value Ref Range   Glucose-Capillary 71  70 - 99 mg/dL    Physical Findings: AIMS: Facial and Oral Movements Muscles of Facial Expression: None, normal Lips and Perioral Area: None, normal Jaw: None, normal Tongue: None, normal,Extremity Movements Upper (arms, wrists, hands, fingers): None, normal Lower (legs, knees, ankles, toes): None, normal, Trunk Movements Neck, shoulders, hips: None, normal, Overall Severity Severity of abnormal movements (highest score from questions above): None, normal Incapacitation due to abnormal movements: None, normal Patient's awareness of abnormal movements (rate only patient's report): No Awareness, Dental Status Current problems with teeth and/or dentures?: No Does patient usually wear dentures?: No  CIWA:  CIWA-Ar Total: 1 COWS:     Treatment Plan Summary: Daily contact with patient to assess and evaluate symptoms and progress in treatment Medication management  Plan:  Will discontinue Zyprexa zydis 5 mg po qhs given patient's long non compliance history. Will start Haldol 5 mg po /IM BID. (Patient on  Forced medication order). Second opinion and forced med order In chart.   Will continue cogentin for EPS.  Will continue  lamictal 25 mg po daily.  Will continue gabapentin 400 mg po bid which will help with pain as well as mood swings.   Will continue trazodone 100 mg for sleep.  Will continue current medication as scheduled for his medical issues.  CSW will work on disposition.  Will make prns available for agitation.  For pedal edema ,patient to elevate the foot end of his bed. For DM ,will continue current medications as scheduled. For back pain,continue pain management (Tramadol prn for next 2 days,topical diclofenac gel,warm compress) For laceration on right sided elbow - will add bacitracin topical.          Medical Decision Making Problem Points:  Established problem, worsening (2), New problem, with no additional work-up planned (3) and Review of psycho-social stressors (1) Data Points:  Order Aims Assessment (2) Review of medication regiment & side effects (2) Review of new medications or change in dosage (2)  I certify that inpatient services furnished can reasonably be expected to improve the patient's condition.   Walden Statz 04/14/2014, 12:41 PM

## 2014-04-14 NOTE — Progress Notes (Signed)
Adult Psychoeducational Group Note  Date:  04/14/2014 Time:  10:46 PM   Group Topic/Focus:  Wrap-Up Group:   The focus of this group is to help patients review their daily goal of treatment and discuss progress on daily workbooks.  Participation Level:  Minimal  Participation Quality:  Appropriate  Affect:  Flat  Cognitive:  Oriented  Insight: Limited  Engagement in Group:  Limited  Modes of Intervention:  Socialization   Additional Comments:  Patient attended and participated in group tonight. He reported that today he went for his meals, and attended his groups. His day went OK  Salley Scarlet [facility] 04/14/2014, 10:46 PM

## 2014-04-14 NOTE — Progress Notes (Signed)
Patient ID: Ivan [name], male   DOB: [birth year], 53 y.o.   MRN: [id number]  D: Pt informed writer that "he came in for a little peace and quiet and that's what I got".  Writer asked pt about his discussion with the Dr. Abbott Pao informed the writer that he "should be leaving soon". Writer asked pt about his discharge plans. Pt stated, [place].  A:  Support and encouragement was offered. 15 min checks continued for safety.  R: Pt remains safe.

## 2014-04-14 NOTE — [facility] Group Notes (Signed)
[facility] [employer] Group Therapy  04/14/2014 , 10:44 AM    Type of Therapy:  M[employer] Presentation  Participation Level:  Slept through the entire group  Summary of Progress/Problems:  [name] from [facility] came to present his recovery story and play the guitar.    [name] 04/14/2014 , 10:44 AM

## 2014-04-14 NOTE — Progress Notes (Signed)
Pt alert and oriented. Pt calm and cooperative. Pt denies SI/HI/AH/VH. Pt contracts for safety. Pt uses voltaren gel for arthritis and reports relief. Pt has worked with PT today. Pt attended group and has been compliant with meds. RN will continue to monitor.

## 2014-04-14 NOTE — Progress Notes (Signed)
Patient ID: Ivan [name], male   DOB: February 12, 1961, 53 y.o.   MRN: [id number]  D: Pt still apprehensive about interacting with the writer. Continues to have min discussion or interaction, however pt is pleasant. Writer asked the pt about his conversation with his Dr. Abbott Pao stated, "everything is ok". Pt voiced no concerns or complaints.   A:  Support and encouragement was offered. 15 min checks continued for safety.  R: Pt remains safe.

## 2014-04-15 DIAGNOSIS — Z91199 Patient's noncompliance with other medical treatment and regimen due to unspecified reason: Secondary | ICD-10-CM

## 2014-04-15 DIAGNOSIS — Z9119 Patient's noncompliance with other medical treatment and regimen: Secondary | ICD-10-CM

## 2014-04-15 DIAGNOSIS — R609 Edema, unspecified: Secondary | ICD-10-CM

## 2014-04-15 DIAGNOSIS — I10 Essential (primary) hypertension: Secondary | ICD-10-CM

## 2014-04-15 DIAGNOSIS — F200 Paranoid schizophrenia: Principal | ICD-10-CM

## 2014-04-15 LAB — GLUCOSE, CAPILLARY
GLUCOSE-CAPILLARY: 105 mg/dL — AB (ref 70–99)
GLUCOSE-CAPILLARY: 141 mg/dL — AB (ref 70–99)
Glucose-Capillary: 108 mg/dL — ABNORMAL HIGH (ref 70–99)
Glucose-Capillary: 73 mg/dL (ref 70–99)
Glucose-Capillary: 124 mg/dL — ABNORMAL HIGH (ref 70–99)

## 2014-04-15 LAB — BASIC METABOLIC PANEL
Anion gap: 14 (ref 5–15)
BUN: 23 mg/dL (ref 6–23)
CO2: 24 mEq/L (ref 19–32)
Calcium: 9.2 mg/dL (ref 8.4–10.5)
Chloride: 100 mEq/L (ref 96–112)
Creatinine, Ser: 1.59 mg/dL — ABNORMAL HIGH (ref 0.50–1.35)
GFR calc Af Amer: 56 mL/min — ABNORMAL LOW (ref >90)
GFR calc non Af Amer: 49 mL/min — ABNORMAL LOW (ref >90)
Glucose, Bld: 114 mg/dL — ABNORMAL HIGH (ref 70–99)
POTASSIUM: 3.5 meq/L — AB (ref 3.7–5.3)
Sodium: 138 mEq/L (ref 137–147)

## 2014-04-15 LAB — PRO B NATRIURETIC PEPTIDE: Pro B Natriuretic peptide (BNP): 167 pg/mL — ABNORMAL HIGH (ref 0–125)

## 2014-04-15 MED ORDER — HALOPERIDOL LACTATE 5 MG/ML IJ SOLN
10.0000 mg | Freq: Every evening | INTRAMUSCULAR | Status: DC
  Filled 2014-04-15 (×7): qty 2

## 2014-04-15 MED ORDER — BENZTROPINE MESYLATE 1 MG PO TABS
1.0000 mg | ORAL_TABLET | Freq: Two times a day (BID) | ORAL | Status: DC
  Administered 2014-04-15 – 2014-04-20 (×11): 1 mg via ORAL
  Filled 2014-04-15 (×4): qty 1
  Filled 2014-04-15: qty 6
  Filled 2014-04-15 (×5): qty 1
  Filled 2014-04-15 (×2): qty 6
  Filled 2014-04-15 (×3): qty 1
  Filled 2014-04-15: qty 6

## 2014-04-15 MED ORDER — HALOPERIDOL 5 MG PO TABS
10.0000 mg | ORAL_TABLET | Freq: Every evening | ORAL | Status: DC
  Administered 2014-04-15 – 2014-04-19 (×5): 10 mg via ORAL
  Filled 2014-04-15 (×5): qty 2
  Filled 2014-04-15 (×2): qty 6
  Filled 2014-04-15: qty 2

## 2014-04-15 MED ORDER — HALOPERIDOL LACTATE 5 MG/ML IJ SOLN
5.0000 mg | Freq: Every day | INTRAMUSCULAR | Status: DC
Start: 2014-04-16 — End: 2014-04-21
  Filled 2014-04-15 (×7): qty 1

## 2014-04-15 MED ORDER — NICOTINE POLACRILEX 2 MG MT GUM
CHEWING_GUM | OROMUCOSAL | Status: AC
  Administered 2014-04-15: 12:00:00
  Filled 2014-04-15: qty 1

## 2014-04-15 MED ORDER — HALOPERIDOL 5 MG PO TABS
5.0000 mg | ORAL_TABLET | Freq: Every day | ORAL | Status: DC
  Administered 2014-04-16 – 2014-04-20 (×5): 5 mg via ORAL
  Filled 2014-04-15 (×2): qty 1
  Filled 2014-04-15 (×2): qty 3
  Filled 2014-04-15 (×4): qty 1

## 2014-04-15 MED ORDER — BENZTROPINE MESYLATE 1 MG/ML IJ SOLN
1.0000 mg | Freq: Two times a day (BID) | INTRAMUSCULAR | Status: DC
  Filled 2014-04-15 (×16): qty 1

## 2014-04-15 NOTE — [facility] Group Notes (Signed)
[facility] Group Notes:  (Counselor/Nursing/MHT/Case Management/Adjunct)  04/15/2014 1:15PM  Type of Therapy:  Group Therapy  Participation Level:  Active  Participation Quality:  Appropriate  Affect:  Flat  Cognitive:  Oriented  Insight:  Improving  Engagement in Group:  Limited  Engagement in Therapy:  Limited  Modes of Intervention:  Discussion, Exploration and Socialization  Summary of Progress/Problems: The topic for group was balance in life.  Pt participated in the discussion about when their life was in balance and out of balance and how this feels.  Pt discussed ways to get back in balance and short term goals they can work on to get where they want to be. Vicken was initially sleeping in group.  I awoke him and gave him the option of going to his room to go to bed. He chose to stay, and remained engaged from there forwards.  He made it clear that he sees himself differently from the rest of the patients.  As they were talking about being here to get help, he was expressing that he does not want to be here because he is on "lock down" and there is nothing he needs from [place].  "My family are the ones that should be here."  Limited insight.   [name] 04/15/2014 4:05 PM

## 2014-04-15 NOTE — Progress Notes (Signed)
D Pt. Denies SI and HI, no complaints of pain or discomfort noted  At this time.  A Writer offered support and encouragement.  R Pt. Remain safe on the unit.  Pt. Forwards little, denies A and VH

## 2014-04-15 NOTE — [facility] Group Notes (Signed)
[facility] Group Notes:  Goals group and Kicking bad habits  Date:  04/15/2014  Time:  10:52 AM  Type of Therapy:  Nurse Education  Participation Level:  Active  Participation Quality:  Appropriate  Affect:  Appropriate  Cognitive:  Alert  Insight:  Appropriate  Engagement in Group:  Engaged  Modes of Intervention:  Activity  Summary of Progress/Problems:  Ivan [name] 04/15/2014, 10:52 AM

## 2014-04-15 NOTE — Progress Notes (Signed)
Psychiatry requested input on management of pedal edema.  I have reviewed patient's chart and medications he is taking. Pt has chronic pedal edema. He is already taking lasix 40 mg twice a day. Would not increase the dose due to renal insufficiency. Plan: Obtain BNP and 2 D ECHO for evaluation of possible heart failure. I will follow up these results personally. If patient does not have heart failure he will need strict follow up with nephrology to monitor renal function while he is on lasix. Would currently continue same lasix dose. Elevate legs as seen recommended already by psychiatry.  Leisa Lenz [facility] [id number] or [phone]

## 2014-04-15 NOTE — Progress Notes (Signed)
[facility] MD Progress Note  04/15/2014 12:00 PM Ivan [name]  MRN:  [id number] Subjective: " I am in pain "  Objective: Patient seen and chart reviewed. Patient is a bit more  Cooperative today  Is less irritable. He continues to be delusional and paranoid.  Denies any AH/VH/SI today. He continues to have back issues which are currently being managed with pain medication. He reports appetite as fair.   Per nursing report patient continues to be withdrawn ,sleeps on recliner most of the time. He is compliant on meds.  Diagnosis:   DSM5:  Primary psychiatric diagnosis:  Schizophrenia,multiple episodes,currently in acute episode   Secondary psychiatric diagnosis:  Non compliance with medical treatment   Non psychiatric diagnosis:  Diabetes Mellitus  Hypertension  Chronic back pain  Pedal edema      ADL's:  Intact  Sleep: Fair  Appetite:  Fair  Suicidal Ideation:  Denies  Homicidal Ideation:  Denies  AEB (as evidenced by):  Psychiatric Specialty Exam: Physical Exam  Constitutional: He is oriented to person, place, and time. He appears well-developed and well-nourished.  HENT:  Head: Normocephalic and atraumatic.  Neurological: He is alert and oriented to person, place, and time.  Skin: Skin is warm and dry.  Laceration to right sided elbow pos-healing    Review of Systems  Musculoskeletal: Positive for back pain.  Neurological: Negative.   Psychiatric/Behavioral: Positive for hallucinations. The patient is nervous/anxious and has insomnia.     Blood pressure 139/76, pulse 67, temperature 97.7 F (36.5 C), temperature source Oral, resp. rate 17, height 6' 1.5" (1.867 m), weight 137.44 kg (303 lb).Body mass index is 39.43 kg/(m^2).  General Appearance: Fairly Groomed  Engineer, water::  Minimal  Speech:  Normal Rate and Slow  Volume:  Decreased  Mood:  Angry, Anxious and Irritable more cooperative  Affect:  Constricted  Thought Process:  Irrelevant  Orientation:   Full (Time, Place, and Person)  Thought Content:  Delusions and Paranoid Ideation-reports people are trying to spy on him.  Suicidal Thoughts:  No  Homicidal Thoughts:  No  Memory:  Negative  Judgement:  Impaired  Insight:  Lacking  Psychomotor Activity:  Decreased  Concentration:  Fair  Recall:  Poor  Fund of Knowledge:Poor  Language: Poor  Akathisia:  No    AIMS (if indicated):   0  Assets:  Others:  access to health care  Sleep:  Number of Hours: 5.75   Musculoskeletal: Strength & Muscle Tone: within normal limits Gait & Station: normal Patient leans: N/A  Current Medications: Current Facility-Administered Medications  Medication Dose Route Frequency Provider Last Rate Last Dose  . acetaminophen (TYLENOL) tablet 650 mg  650 mg Oral Q6H PRN Laverle Hobby, PA-C   650 mg at 04/15/14 0303  . alum & mag hydroxide-simeth (MAALOX/MYLANTA) 200-200-20 MG/5ML suspension 30 mL  30 mL Oral Q4H PRN Laverle Hobby, PA-C      . amLODipine (NORVASC) tablet 5 mg  5 mg Oral BID Laverle Hobby, PA-C   5 mg at 04/15/14 0809  . aspirin EC tablet 81 mg  81 mg Oral Daily Laverle Hobby, PA-C   81 mg at 04/15/14 0809  . benztropine (COGENTIN) tablet 1 mg  1 mg Oral BID Ursula Alert, MD       Or  . benztropine mesylate (COGENTIN) injection 1 mg  1 mg Intramuscular BID Seleena Reimers, MD      . cloNIDine (CATAPRES) tablet 0.1 mg  0.1 mg Oral  BID Laverle Hobby, PA-C   0.1 mg at 04/15/14 8270  . diclofenac sodium (VOLTAREN) 1 % transdermal gel 2 g  2 g Topical QID Ursula Alert, MD   2 g at 04/15/14 0811  . furosemide (LASIX) tablet 40 mg  40 mg Oral BID Laverle Hobby, PA-C   40 mg at 04/15/14 7867  . gabapentin (NEURONTIN) capsule 400 mg  400 mg Oral TID Laverle Hobby, PA-C   400 mg at 04/15/14 1159  . glipiZIDE (GLUCOTROL) tablet 5 mg  5 mg Oral QAC breakfast Laverle Hobby, PA-C   5 mg at 04/15/14 5449  . haloperidol (HALDOL) tablet 10 mg  10 mg Oral QPM Ursula Alert, MD       Or   . haloperidol lactate (HALDOL) injection 10 mg  10 mg Intramuscular QPM Ursula Alert, MD      . Derrill Memo ON 04/16/2014] haloperidol (HALDOL) tablet 5 mg  5 mg Oral Daily Ursula Alert, MD       Or  . Derrill Memo ON 04/16/2014] haloperidol lactate (HALDOL) injection 5 mg  5 mg Intramuscular Daily Jolonda Gomm, MD      . hydrALAZINE (APRESOLINE) tablet 100 mg  100 mg Oral TID Laverle Hobby, PA-C   100 mg at 04/15/14 1159  . insulin aspart (novoLOG) injection 0-20 Units  0-20 Units Subcutaneous TID WC Spencer E Simon, PA-C      . insulin aspart (novoLOG) injection 0-5 Units  0-5 Units Subcutaneous QHS Spencer E Simon, PA-C      . labetalol (NORMODYNE) tablet 200 mg  200 mg Oral BID Laverle Hobby, PA-C   200 mg at 04/15/14 2010  . lamoTRIgine (LAMICTAL) tablet 25 mg  25 mg Oral QPM Praneeth Bussey, MD   25 mg at 04/14/14 1710  . magnesium hydroxide (MILK OF MAGNESIA) suspension 30 mL  30 mL Oral Daily PRN Laverle Hobby, PA-C      . neomycin-bacitracin-polymyxin (NEOSPORIN) ointment   Topical BID Mojeed Akintayo   15 application at 02/28/18 0810  . nicotine polacrilex (NICORETTE) gum 2 mg  2 mg Oral PRN Mojeed Akintayo   2 mg at 04/15/14 0815  . OLANZapine zydis (ZYPREXA) disintegrating tablet 5 mg  5 mg Oral BID PRN Ursula Alert, MD   5 mg at 04/15/14 7588   Or  . OLANZapine (ZYPREXA) injection 5 mg  5 mg Intramuscular BID PRN Ursula Alert, MD      . traMADol (ULTRAM) tablet 50 mg  50 mg Oral Q12H PRN Ursula Alert, MD      . traZODone (DESYREL) tablet 100 mg  100 mg Oral QHS Ursula Alert, MD   100 mg at 04/14/14 2137  . Vitamin D (Ergocalciferol) (DRISDOL) capsule 50,000 Units  50,000 Units Oral Q21 days Laverle Hobby, PA-C        Lab Results:  Results for orders placed during the hospital encounter of 04/13/14 (from the past 48 hour(s))  GLUCOSE, CAPILLARY     Status: Abnormal   Collection Time    04/13/14  4:29 PM      Result Value Ref Range   Glucose-Capillary 119 (*) 70 - 99  mg/dL   Comment 1 Documented in Chart     Comment 2 Notify RN    HEMOGLOBIN A1C     Status: Abnormal   Collection Time    04/13/14  7:30 PM      Result Value Ref Range   Hemoglobin A1C 6.3 (*) <5.7 %  Comment: (NOTE)                                                                               According to the ADA Clinical Practice Recommendations for 2011, when     HbA1c is used as a screening test:      >=6.5%   Diagnostic of Diabetes Mellitus               (if abnormal result is confirmed)     5.7-6.4%   Increased risk of developing Diabetes Mellitus     References:Diagnosis and Classification of Diabetes Mellitus,Diabetes     IDPO,2423,53(IRWER 1):S62-S69 and Standards of Medical Care in             Diabetes - 2011,Diabetes XVQM,0867,61 (Suppl 1):S11-S61.   Mean Plasma Glucose 134 (*) <117 mg/dL   Comment: Performed at [facility], [facility]     Status: Abnormal   Collection Time    04/13/14  8:33 PM      Result Value Ref Range   Glucose-Capillary 116 (*) 70 - 99 mg/dL  GLUCOSE, CAPILLARY     Status: Abnormal   Collection Time    04/14/14  6:08 AM      Result Value Ref Range   Glucose-Capillary 105 (*) 70 - 99 mg/dL  GLUCOSE, CAPILLARY     Status: None   Collection Time    04/14/14 11:55 AM      Result Value Ref Range   Glucose-Capillary 71  70 - 99 mg/dL  GLUCOSE, CAPILLARY     Status: Abnormal   Collection Time    04/14/14  4:43 PM      Result Value Ref Range   Glucose-Capillary 101 (*) 70 - 99 mg/dL  GLUCOSE, CAPILLARY     Status: Abnormal   Collection Time    04/14/14  8:58 PM      Result Value Ref Range   Glucose-Capillary 104 (*) 70 - 99 mg/dL   Comment 1 Notify RN    GLUCOSE, CAPILLARY     Status: Abnormal   Collection Time    04/15/14  6:24 AM      Result Value Ref Range   Glucose-Capillary 108 (*) 70 - 99 mg/dL  BASIC METABOLIC PANEL     Status: Abnormal   Collection Time    04/15/14  6:30 AM      Result Value Ref Range   Sodium  138  137 - 147 mEq/L   Potassium 3.5 (*) 3.7 - 5.3 mEq/L   Chloride 100  96 - 112 mEq/L   CO2 24  19 - 32 mEq/L   Glucose, Bld 114 (*) 70 - 99 mg/dL   BUN 23  6 - 23 mg/dL   Creatinine, Ser 1.59 (*) 0.50 - 1.35 mg/dL   Calcium 9.2  8.4 - 10.5 mg/dL   GFR calc non Af Amer 49 (*) >90 mL/min   GFR calc Af Amer 56 (*) >90 mL/min   Comment: (NOTE)     The eGFR has been calculated using the CKD EPI equation.     This calculation has not been validated in all clinical situations.     eGFR's  persistently <90 mL/min signify possible Chronic Kidney     Disease.   Anion gap 14  5 - 15   Comment: Performed at [facility], [facility]     Status: None   Collection Time    04/15/14 11:31 AM      Result Value Ref Range   Glucose-Capillary 73  70 - 99 mg/dL   Comment 1 Documented in Chart     Comment 2 Notify RN      Physical Findings: AIMS: Facial and Oral Movements Muscles of Facial Expression: None, normal Lips and Perioral Area: None, normal Jaw: None, normal Tongue: None, normal,Extremity Movements Upper (arms, wrists, hands, fingers): None, normal Lower (legs, knees, ankles, toes): None, normal, Trunk Movements Neck, shoulders, hips: None, normal, Overall Severity Severity of abnormal movements (highest score from questions above): None, normal Incapacitation due to abnormal movements: None, normal Patient's awareness of abnormal movements (rate only patient's report): No Awareness, Dental Status Current problems with teeth and/or dentures?: No Does patient usually wear dentures?: No  CIWA:  CIWA-Ar Total: 1 COWS:     Treatment Plan Summary: Daily contact with patient to assess and evaluate symptoms and progress in treatment Medication management  Plan:  Will increase Haldol to 15 mg po /IM . (Patient on  Forced medication order). Second opinion and forced med order In chart.   Will continue cogentin for EPS.  Will continue  lamictal 25 mg po daily.   Will continue gabapentin 400 mg po bid which will help with pain as well as mood swings.  Will continue trazodone 100 mg for sleep.  Will continue current medication as scheduled for his medical issues.  CSW will work on disposition.  Will make prns available for agitation.  For pedal edema ,patient to elevate the foot end of his bed- IM consult placed. BMP reviewed . For DM ,will continue current medications as scheduled. For back pain,continue pain management (Tramadol prn for next 2 days,topical diclofenac gel,warm compress) For laceration on right sided elbow - will add bacitracin topical.          Medical Decision Making Problem Points:  Established problem, worsening (2), New problem, with no additional work-up planned (3) and Review of psycho-social stressors (1) Data Points:  Order Aims Assessment (2) Review of medication regiment & side effects (2) Review of new medications or change in dosage (2)  I certify that inpatient services furnished can reasonably be expected to improve the patient's condition.   Ivan [name] 04/15/2014, 12:00 PM
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
## 2014-04-16 LAB — GLUCOSE, CAPILLARY
GLUCOSE-CAPILLARY: 127 mg/dL — AB (ref 70–99)
Glucose-Capillary: 124 mg/dL — ABNORMAL HIGH (ref 70–99)
Glucose-Capillary: 100 mg/dL — ABNORMAL HIGH (ref 70–99)
Glucose-Capillary: 121 mg/dL — ABNORMAL HIGH (ref 70–99)

## 2014-04-16 NOTE — Progress Notes (Signed)
[facility] MD Progress Note  04/16/2014 2:31 PM Ivan [name]  MRN:  [id number] Subjective: " I am feeling better"  Objective: Patient seen and chart reviewed. Patient found on his recliner ,had to be asked to open his eyes . But patient is redirectable. Patient reports he slept a little better . Patient continues to be guarded and paranoid  but denies any SI/AH.  He continues to have back issues which are currently being managed with pain medication. He also has pedal edema which is being managed by hospitalist. He reports appetite as fair.   Per nursing report patient continues to be withdrawn ,sleeps on recliner most of the time. He is compliant on meds. No outbursts.  Diagnosis:   DSM5:  Primary psychiatric diagnosis:  Schizophrenia,multiple episodes,currently in acute episode   Secondary psychiatric diagnosis:  Non compliance with medical treatment   Non psychiatric diagnosis:  Diabetes Mellitus  Hypertension  Chronic back pain  Pedal edema      ADL's:  Intact  Sleep: Fair  Appetite:  Fair  Suicidal Ideation:  Denies  Homicidal Ideation:  Denies  AEB (as evidenced by):  Psychiatric Specialty Exam: Physical Exam  Constitutional: He is oriented to person, place, and time. He appears well-developed and well-nourished.  HENT:  Head: Normocephalic and atraumatic.  Neurological: He is alert and oriented to person, place, and time.  Skin: Skin is warm and dry.  Laceration to right sided elbow pos-healing    Review of Systems  Cardiovascular: Positive for leg swelling.  Musculoskeletal: Positive for back pain.  Neurological: Negative.   Psychiatric/Behavioral: The patient is nervous/anxious and has insomnia.     Blood pressure 135/66, pulse 62, temperature 97.5 F (36.4 C), temperature source Oral, resp. rate 18, height 6' 1.5" (1.867 m), weight 137.44 kg (303 lb).Body mass index is 39.43 kg/(m^2).  General Appearance: Fairly Groomed  Engineer, water::  Minimal   Speech:  Normal Rate and Slow  Volume:  Decreased  Mood:  Angry, Anxious and Irritable more cooperative  Affect:  Constricted  Thought Process:  Irrelevant  Orientation:  Full (Time, Place, and Person)  Thought Content:  Delusions and Paranoid Ideation denies any paranoia but appears to guarded ,suspicious  Suicidal Thoughts:  No  Homicidal Thoughts:  No  Memory:  Negative  Judgement:  Impaired  Insight:  Lacking  Psychomotor Activity:  Decreased  Concentration:  Fair  Recall:  Poor  Fund of Knowledge:Poor  Language: Poor  Akathisia:  No    AIMS (if indicated):   0  Assets:  Others:  access to health care  Sleep:  Number of Hours: 5.25   Musculoskeletal: Strength & Muscle Tone: within normal limits Gait & Station: normal Patient leans: N/A  Current Medications: Current Facility-Administered Medications  Medication Dose Route Frequency Provider Last Rate Last Dose  . acetaminophen (TYLENOL) tablet 650 mg  650 mg Oral Q6H PRN Laverle Hobby, PA-C   650 mg at 04/15/14 1346  . alum & mag hydroxide-simeth (MAALOX/MYLANTA) 200-200-20 MG/5ML suspension 30 mL  30 mL Oral Q4H PRN Laverle Hobby, PA-C      . amLODipine (NORVASC) tablet 5 mg  5 mg Oral BID Laverle Hobby, PA-C   5 mg at 04/16/14 4920  . aspirin EC tablet 81 mg  81 mg Oral Daily Laverle Hobby, PA-C   81 mg at 04/16/14 0813  . benztropine (COGENTIN) tablet 1 mg  1 mg Oral BID Ursula Alert, MD   1 mg at 04/16/14 [phone]  Or  . benztropine mesylate (COGENTIN) injection 1 mg  1 mg Intramuscular BID Marizol Borror, MD      . cloNIDine (CATAPRES) tablet 0.1 mg  0.1 mg Oral BID Laverle Hobby, PA-C   0.1 mg at 04/16/14 0813  . diclofenac sodium (VOLTAREN) 1 % transdermal gel 2 g  2 g Topical QID Ursula Alert, MD   2 g at 04/16/14 0840  . furosemide (LASIX) tablet 40 mg  40 mg Oral BID Laverle Hobby, PA-C   40 mg at 04/16/14 0813  . gabapentin (NEURONTIN) capsule 400 mg  400 mg Oral TID Laverle Hobby, PA-C   400 mg  at 04/16/14 1145  . glipiZIDE (GLUCOTROL) tablet 5 mg  5 mg Oral QAC breakfast Laverle Hobby, PA-C   5 mg at 04/16/14 2683  . haloperidol (HALDOL) tablet 10 mg  10 mg Oral QPM Ursula Alert, MD   10 mg at 04/15/14 1700   Or  . haloperidol lactate (HALDOL) injection 10 mg  10 mg Intramuscular QPM Novaleigh Kohlman, MD      . haloperidol (HALDOL) tablet 5 mg  5 mg Oral Daily Shelise Maron, MD   5 mg at 04/16/14 0813   Or  . haloperidol lactate (HALDOL) injection 5 mg  5 mg Intramuscular Daily Vivan Agostino, MD      . hydrALAZINE (APRESOLINE) tablet 100 mg  100 mg Oral TID Laverle Hobby, PA-C   100 mg at 04/16/14 1145  . insulin aspart (novoLOG) injection 0-20 Units  0-20 Units Subcutaneous TID WC Laverle Hobby, PA-C   3 Units at 04/16/14 0636  . insulin aspart (novoLOG) injection 0-5 Units  0-5 Units Subcutaneous QHS Spencer E Simon, PA-C      . labetalol (NORMODYNE) tablet 200 mg  200 mg Oral BID Laverle Hobby, PA-C   200 mg at 04/16/14 0840  . lamoTRIgine (LAMICTAL) tablet 25 mg  25 mg Oral QPM Hallie Ishida, MD   25 mg at 04/15/14 1659  . magnesium hydroxide (MILK OF MAGNESIA) suspension 30 mL  30 mL Oral Daily PRN Laverle Hobby, PA-C      . neomycin-bacitracin-polymyxin (NEOSPORIN) ointment   Topical BID Mojeed Akintayo      . nicotine polacrilex (NICORETTE) gum 2 mg  2 mg Oral PRN Mojeed Akintayo   2 mg at 04/16/14 0459  . OLANZapine zydis (ZYPREXA) disintegrating tablet 5 mg  5 mg Oral BID PRN Ursula Alert, MD   5 mg at 04/15/14 4196   Or  . OLANZapine (ZYPREXA) injection 5 mg  5 mg Intramuscular BID PRN Ursula Alert, MD      . traZODone (DESYREL) tablet 100 mg  100 mg Oral QHS Ursula Alert, MD   100 mg at 04/15/14 2146  . Vitamin D (Ergocalciferol) (DRISDOL) capsule 50,000 Units  50,000 Units Oral Q21 days Laverle Hobby, PA-C        Lab Results:  Results for orders placed during the hospital encounter of 04/13/14 (from the past 48 hour(s))  GLUCOSE, CAPILLARY      Status: Abnormal   Collection Time    04/14/14  4:43 PM      Result Value Ref Range   Glucose-Capillary 101 (*) 70 - 99 mg/dL  GLUCOSE, CAPILLARY     Status: Abnormal   Collection Time    04/14/14  8:58 PM      Result Value Ref Range   Glucose-Capillary 104 (*) 70 - 99 mg/dL   Comment  1 Notify RN    GLUCOSE, CAPILLARY     Status: Abnormal   Collection Time    04/15/14  6:24 AM      Result Value Ref Range   Glucose-Capillary 108 (*) 70 - 99 mg/dL  BASIC METABOLIC PANEL     Status: Abnormal   Collection Time    04/15/14  6:30 AM      Result Value Ref Range   Sodium 138  137 - 147 mEq/L   Potassium 3.5 (*) 3.7 - 5.3 mEq/L   Chloride 100  96 - 112 mEq/L   CO2 24  19 - 32 mEq/L   Glucose, Bld 114 (*) 70 - 99 mg/dL   BUN 23  6 - 23 mg/dL   Creatinine, Ser 1.59 (*) 0.50 - 1.35 mg/dL   Calcium 9.2  8.4 - 10.5 mg/dL   GFR calc non Af Amer 49 (*) >90 mL/min   GFR calc Af Amer 56 (*) >90 mL/min   Comment: (NOTE)     The eGFR has been calculated using the CKD EPI equation.     This calculation has not been validated in all clinical situations.     eGFR's persistently <90 mL/min signify possible Chronic Kidney     Disease.   Anion gap 14  5 - 15   Comment: Performed at [facility]     Status: None   Collection Time    04/15/14 11:31 AM      Result Value Ref Range   Glucose-Capillary 73  70 - 99 mg/dL   Comment 1 Documented in Chart     Comment 2 Notify RN    GLUCOSE, CAPILLARY     Status: Abnormal   Collection Time    04/15/14  4:54 PM      Result Value Ref Range   Glucose-Capillary 105 (*) 70 - 99 mg/dL  PRO B NATRIURETIC PEPTIDE     Status: Abnormal   Collection Time    04/15/14  7:12 PM      Result Value Ref Range   Pro B Natriuretic peptide (BNP) 167.0 (*) 0 - 125 pg/mL   Comment: Performed at [place], [facility]     Status: Abnormal   Collection Time    04/15/14  9:08 PM      Result Value Ref Range    Glucose-Capillary 141 (*) 70 - 99 mg/dL  GLUCOSE, CAPILLARY     Status: Abnormal   Collection Time    04/15/14  9:40 PM      Result Value Ref Range   Glucose-Capillary 124 (*) 70 - 99 mg/dL  GLUCOSE, CAPILLARY     Status: Abnormal   Collection Time    04/16/14  6:20 AM      Result Value Ref Range   Glucose-Capillary 124 (*) 70 - 99 mg/dL  GLUCOSE, CAPILLARY     Status: Abnormal   Collection Time    04/16/14 11:35 AM      Result Value Ref Range   Glucose-Capillary 100 (*) 70 - 99 mg/dL    Physical Findings: AIMS: Facial and Oral Movements Muscles of Facial Expression: None, normal Lips and Perioral Area: None, normal Jaw: None, normal Tongue: None, normal,Extremity Movements Upper (arms, wrists, hands, fingers): None, normal Lower (legs, knees, ankles, toes): None, normal, Trunk Movements Neck, shoulders, hips: None, normal, Overall Severity Severity of abnormal movements (highest score from questions above): None, normal Incapacitation due to abnormal movements:  None, normal Patient's awareness of abnormal movements (rate only patient's report): No Awareness, Dental Status Current problems with teeth and/or dentures?: No Does patient usually wear dentures?: No  CIWA:  CIWA-Ar Total: 1 COWS:     Treatment Plan Summary: Daily contact with patient to assess and evaluate symptoms and progress in treatment Medication management  Plan:  Patient has CKD as well as has pedal edema . IM seen patient recommended 2d echo , BNP (elevated)  as well as referral to [facility] for monitoring his renal function. Patient however has been refusing the echo and reports he has done it all and does not want to spend more of his money.  Will continue  Haldol 15 mg po /IM . (Patient on  Forced medication order). Will be cautious in increasing the dose 2/2 his multiple medical issues. Second opinion and forced med order In chart.   Will continue cogentin for EPS.  Will continue  lamictal  25 mg po daily.  Will continue gabapentin 400 mg po bid which will help with pain as well as mood swings.  Will continue trazodone 100 mg for sleep.  Will continue current medication as scheduled for his medical issues.  CSW will work on disposition.  Will make prns available for agitation.  . For DM ,will continue current medications as scheduled. For back pain,continue pain management (topical diclofenac gel,warm compress) For laceration on right sided elbow - will add bacitracin topical.          Medical Decision Making Problem Points:  Established problem, worsening (2), New problem, with no additional work-up planned (3) and Review of psycho-social stressors (1) Data Points:  Order Aims Assessment (2) Review of medication regiment & side effects (2) Review of new medications or change in dosage (2)  I certify that inpatient services furnished can reasonably be expected to improve the patient's condition.   Kenasia Scheller 04/16/2014, 2:31 PM

## 2014-04-16 NOTE — BHH Group Notes (Signed)
[facility] LCSW Group Therapy  04/16/2014 1:35 PM  Type of Therapy:  Group Therapy  Participation Level:  Minimal  Participation Quality:  Drowsy  Affect:  Flat  Cognitive:  Lacking  Insight:  Poor  Engagement in Therapy:  Limited and Poor  Modes of Intervention:  Confrontation, Discussion, Education, Exploration, Problem-solving, Rapport Building, Socialization and Support  Summary of Progress/Problems: Feelings around Relapse. Group members discussed the meaning of relapse and shared personal stories of relapse, how it affected them and others, and how they perceived themselves during this time. Group members were encouraged to identify triggers, warning signs and coping skills used when facing the possibility of relapse. Social supports were discussed and explored in detail. Ivan [name] presented with lethargic affect during group but was able to participate in group discussion when asked a direct question by CSW. Pt shared that relapse can mean failure. He shows limited insight AEB his difficulty in identifying why distrust of doctors and subsequent medication noncompliance can be addressed to avoid relapse. Ivan [name] minimally participated in group discussion.          Smart, Ivan [name] LCSWA 04/16/2014, 1:35 PM

## 2014-04-16 NOTE — BHH Group Notes (Signed)
[facility] LCSW Aftercare Discharge Planning Group Note   04/16/2014 10:53 AM  Participation Quality:  Minimal  Mood/Affect:  Flat  Depression Rating:  8  Anxiety Rating:  8  Thoughts of Suicide:  No Will you contract for safety?   NA  Current AVH:  Paranoia  Plan for Discharge/Comments:  Ivan [name] is unhappy about being here over the weekend as he believes he is fine and does not need to be here.  However, he is resigned to being here and is not fighting it.  He states that his edema does not cause pain, and he is fine, but Dr's note indicates he was c/o pain yesterday.  Transportation Means: sheriff  Supports: family  [name], Ivan [name]

## 2014-04-16 NOTE — Progress Notes (Signed)
Patient ID: Ivan [name], male   DOB: [birth year], 53 y.o.   MRN: [id number] D. Patient presents with irritable mood, affect congruent. Patient states '' I'm doing better. I'm ready to go home now. '' Pt completed self inventory and rates his depression at 8/10 , 10 being worst on depression scale 1 being least. He continues to have bilateral pitting edema, and is being followed by hospitalist. Discussed patients progress with both Dr. Charlies Silvers and Dr. Shea Evans this morning. Pt with pending 2d Echocardiogram. Writer notified Cardiopulmonary, as well as care link to transport patient for exam. Patient became agitated, irritable cursing at writer stating '' I don't know what the hell they ordered that for. I know what's wrong with me! '' Patient has refused to have procedure completed today. Writer notified Dr. Shea Evans of above information as patient continued to refuse. R. Patient denies any other acute concerns, and is in no acute distress at this time. Will continue to monitor q 15 minutes for safety.

## 2014-04-16 NOTE — Progress Notes (Signed)
Patient ID: Ivan [name], male   DOB: June 10, 1961, 53 y.o.   MRN: [id number] D: Client pleasant on the unit, but suspicious, no interaction noted. A: Writer continuous to assess for s/s of distress. Writer notifies radiology of pending order for Echocardiogram, spoke with [employer], who reports they will call in the am to have client brought over for the procedure. R: Writer will pass on in report.

## 2014-04-16 NOTE — Progress Notes (Signed)
Patient ID: Ivan [name], male   DOB: 1961/02/05, 53 y.o.   MRN: [id number] Pt resting while sitting in chair in room, easily awaken. Pt denies SI/HI and A/V hall @ present to Probation officer. Pt denies pain or discomfort @ present. Will continue to evaluate for stabilization and monitor q 38min for safety.

## 2014-04-16 NOTE — B[facility] Group Notes (Signed)
Adult Psychoeducational Group Note  Date:  04/16/2014 Time:  8:54 PM  Group Topic/Focus:  Wrap-Up Group:   The focus of this group is to help patients review their daily goal of treatment and discuss progress on daily workbooks.  Participation Level:  Did Not Attend  Participation Quality:  None  Affect:  None  Cognitive:  None  Insight: None  Engagement in Group:  None  Modes of Intervention:  Discussion  Additional Comments:  Ivan [name] did not attend group.  [name] A 04/16/2014, 8:54 PM

## 2014-04-16 NOTE — Tx Team (Signed)
  Interdisciplinary Treatment Plan Update   Date Reviewed:  04/16/2014  Time Reviewed:  10:49 AM  Progress in Treatment:   Attending groups: Yes Participating in groups: Yes Taking medication as prescribed: Yes  Tolerating medication: Yes Family/Significant other contact made: Yes  Patient understands diagnosis: Yes  Discussing patient identified problems/goals with staff: Yes  See initial care plan Medical problems stabilized or resolved: Yes Denies suicidal/homicidal ideation: Yes  In tx team Patient has not harmed self or others: Yes  For review of initial/current patient goals, please see plan of care.  Estimated Length of Stay:  4-5 days  Reason for Continuation of Hospitalization: Medication stabilization Other; describe Paranoia  New Problems/Goals identified:  N/A  Discharge Plan or Barriers:   return home, follow up outpt  Additional Comments:  " I am in pain "   Patient is a bit more Cooperative today Is less irritable. He continues to be delusional and paranoid. He was started on Lamictal as a mood stabilizer and Haldol for paranoia.  Today the dosage of Haldol was changed-he now gets 5QD and 10QHS.  He was started on Haldol to allow him getting Haldol Dec.  However, due to edema we will not be giving that to him here.   Attendees:  Signature: Steva Colder, MD 04/16/2014 10:49 AM   Signature: Ripley Fraise, LCSW 04/16/2014 10:49 AM  Signature: Elmarie Shiley, NP 04/16/2014 10:49 AM  Signature: Mayra Neer, RN 04/16/2014 10:49 AM  Signature: Darrol Angel, RN 04/16/2014 10:49 AM  Signature:  04/16/2014 10:49 AM  Signature:   04/16/2014 10:49 AM  Signature:    Signature:    Signature:    Signature:    Signature:    Signature:      Scribe for Treatment Team:   Ripley Fraise, LCSW  04/16/2014 10:49 AM

## 2014-04-17 DIAGNOSIS — F2081 Schizophreniform disorder: Secondary | ICD-10-CM

## 2014-04-17 LAB — GLUCOSE, CAPILLARY
GLUCOSE-CAPILLARY: 125 mg/dL — AB (ref 70–99)
GLUCOSE-CAPILLARY: 118 mg/dL — AB (ref 70–99)
GLUCOSE-CAPILLARY: 142 mg/dL — AB (ref 70–99)
Glucose-Capillary: 136 mg/dL — ABNORMAL HIGH (ref 70–99)

## 2014-04-17 NOTE — Progress Notes (Signed)
Patient ID: Ivan [name], male   DOB: April 21, 1961, 53 y.o.   MRN: [id number] [facility] MD Progress Note  04/17/2014 3:05 PM MCCABE MARKOSKI  MRN:  [id number] Subjective: "I'm pretty good." Patient goes on to say he slept well, and is eating well. He reports he is only depressed about his health problems. He says he is attending the groups on the unit and he is taking his medication. He does not know who he will follow up with when he is discharged. He tells me he is from [place], [place].  Objective: Patient seen and chart reviewed. Patient was found in day room after 2 attempts to see him earlier, as he was asleep in his chair in his room. He is pleasant and cooperative and answers my questions. He is oriented x 3, RN reported him as irritable earlier, but he is taking his medication as noted. He denied any side effects or problems with his medication. He denied any new problems. Diagnosis:   DSM5:  Primary psychiatric diagnosis:  Schizophrenia,multiple episodes,currently in acute episode   Secondary psychiatric diagnosis:  Non compliance with medical treatment   Non psychiatric diagnosis:  Diabetes Mellitus  Hypertension  Chronic back pain  Pedal edema      ADL's:  Intact  Sleep: "pretty good" Appetite:  "pretty good"  Suicidal Ideation:  Denies  Homicidal Ideation:  Denies  AEB (as evidenced by):  Psychiatric Specialty Exam: Physical Exam  Constitutional: He is oriented to person, place, and time. He appears well-developed and well-nourished.  HENT:  Head: Normocephalic and atraumatic.  Neurological: He is alert and oriented to person, place, and time.  Skin: Skin is warm and dry.  Laceration to right sided elbow pos-healing    Review of Systems  Cardiovascular: Positive for leg swelling.  Musculoskeletal: Positive for back pain.  Neurological: Negative.   Psychiatric/Behavioral: The patient is nervous/anxious and has insomnia.     Blood pressure 123/67, pulse 67,  temperature 98.3 F (36.8 C), temperature source Oral, resp. rate 18, height 6' 1.5" (1.867 m), weight 137.44 kg (303 lb).Body mass index is 39.43 kg/(m^2).  General Appearance: Fairly Groomed  Engineer, water::  fair  Speech:  Normal Rate and Slow  Volume:  Decreased  Mood:  Less irritable  Affect:  Constricted guarded  Thought Process:  Goal directed  Orientation:  Full (Time, Place, and Person)  Thought Content:  Delusions and Paranoid Ideation denies any paranoia but appears to guarded ,suspicious  Suicidal Thoughts:  No  Homicidal Thoughts:  No  Memory:  Negative  Judgement:  Impaired  Insight:  Lacking  Psychomotor Activity:  Decreased  Concentration:  Fair  Recall:  Poor  Fund of Knowledge:Poor  Language: Poor  Akathisia:  No    AIMS (if indicated):   0  Assets:  Others:  access to health care  Sleep:  Number of Hours: 6.5   Musculoskeletal: Strength & Muscle Tone: within normal limits Gait & Station: normal Patient leans: N/A  Current Medications: Current Facility-Administered Medications  Medication Dose Route Frequency Provider Last Rate Last Dose  . acetaminophen (TYLENOL) tablet 650 mg  650 mg Oral Q6H PRN Laverle Hobby, PA-C   650 mg at 04/15/14 1346  . alum & mag hydroxide-simeth (MAALOX/MYLANTA) 200-200-20 MG/5ML suspension 30 mL  30 mL Oral Q4H PRN Laverle Hobby, PA-C      . amLODipine (NORVASC) tablet 5 mg  5 mg Oral BID Laverle Hobby, PA-C   5 mg at 04/17/14 0747  .  aspirin EC tablet 81 mg  81 mg Oral Daily Laverle Hobby, PA-C   81 mg at 04/17/14 0747  . benztropine (COGENTIN) tablet 1 mg  1 mg Oral BID Ursula Alert, MD   1 mg at 04/17/14 0746   Or  . benztropine mesylate (COGENTIN) injection 1 mg  1 mg Intramuscular BID Ursula Alert, MD      . cloNIDine (CATAPRES) tablet 0.1 mg  0.1 mg Oral BID Laverle Hobby, PA-C   0.1 mg at 04/17/14 0747  . diclofenac sodium (VOLTAREN) 1 % transdermal gel 2 g  2 g Topical QID Ursula Alert, MD   2 g at  04/17/14 0747  . furosemide (LASIX) tablet 40 mg  40 mg Oral BID Laverle Hobby, PA-C   40 mg at 04/17/14 0747  . gabapentin (NEURONTIN) capsule 400 mg  400 mg Oral TID Laverle Hobby, PA-C   400 mg at 04/17/14 1150  . glipiZIDE (GLUCOTROL) tablet 5 mg  5 mg Oral QAC breakfast Laverle Hobby, PA-C   5 mg at 04/17/14 [id number]  . haloperidol (HALDOL) tablet 10 mg  10 mg Oral QPM Ursula Alert, MD   10 mg at 04/16/14 1615   Or  . haloperidol lactate (HALDOL) injection 10 mg  10 mg Intramuscular QPM Saramma Eappen, MD      . haloperidol (HALDOL) tablet 5 mg  5 mg Oral Daily Saramma Eappen, MD   5 mg at 04/17/14 0746   Or  . haloperidol lactate (HALDOL) injection 5 mg  5 mg Intramuscular Daily Saramma Eappen, MD      . hydrALAZINE (APRESOLINE) tablet 100 mg  100 mg Oral TID Laverle Hobby, PA-C   100 mg at 04/17/14 1150  . insulin aspart (novoLOG) injection 0-20 Units  0-20 Units Subcutaneous TID WC Laverle Hobby, PA-C   3 Units at 04/17/14 [phone]  . insulin aspart (novoLOG) injection 0-5 Units  0-5 Units Subcutaneous QHS Spencer E Simon, PA-C      . labetalol (NORMODYNE) tablet 200 mg  200 mg Oral BID Laverle Hobby, PA-C   200 mg at 04/17/14 0747  . lamoTRIgine (LAMICTAL) tablet 25 mg  25 mg Oral QPM Saramma Eappen, MD   25 mg at 04/16/14 1615  . magnesium hydroxide (MILK OF MAGNESIA) suspension 30 mL  30 mL Oral Daily PRN Laverle Hobby, PA-C      . neomycin-bacitracin-polymyxin (NEOSPORIN) ointment   Topical BID Mojeed Akintayo   15 application at [id number]  . nicotine polacrilex (NICORETTE) gum 2 mg  2 mg Oral PRN Mojeed Akintayo   2 mg at 04/16/14 2302  . OLANZapine zydis (ZYPREXA) disintegrating tablet 5 mg  5 mg Oral BID PRN Ursula Alert, MD   5 mg at 04/15/14 [id number]   Or  . OLANZapine (ZYPREXA) injection 5 mg  5 mg Intramuscular BID PRN Ursula Alert, MD      . traZODone (DESYREL) tablet 100 mg  100 mg Oral QHS Ursula Alert, MD   100 mg at 04/16/14 2118  . Vitamin D  (Ergocalciferol) (DRISDOL) capsule 50,000 Units  50,000 Units Oral Q21 days Laverle Hobby, PA-C        Lab Results:  Results for orders placed during the hospital encounter of 04/13/14 (from the past 48 hour(s))  GLUCOSE, CAPILLARY     Status: Abnormal   Collection Time    04/15/14  4:54 PM      Result Value Ref Range  Glucose-Capillary 105 (*) 70 - 99 mg/dL  PRO B NATRIURETIC PEPTIDE     Status: Abnormal   Collection Time    04/15/14  7:12 PM      Result Value Ref Range   Pro B Natriuretic peptide (BNP) 167.0 (*) 0 - 125 pg/mL   Comment: Performed at [facility], CAPILLARY     Status: Abnormal   Collection Time    04/15/14  9:08 PM      Result Value Ref Range   Glucose-Capillary 141 (*) 70 - 99 mg/dL  GLUCOSE, CAPILLARY     Status: Abnormal   Collection Time    04/15/14  9:40 PM      Result Value Ref Range   Glucose-Capillary 124 (*) 70 - 99 mg/dL  GLUCOSE, CAPILLARY     Status: Abnormal   Collection Time    04/16/14  6:20 AM      Result Value Ref Range   Glucose-Capillary 124 (*) 70 - 99 mg/dL  GLUCOSE, CAPILLARY     Status: Abnormal   Collection Time    04/16/14 11:35 AM      Result Value Ref Range   Glucose-Capillary 100 (*) 70 - 99 mg/dL  GLUCOSE, CAPILLARY     Status: Abnormal   Collection Time    04/16/14  5:05 PM      Result Value Ref Range   Glucose-Capillary 121 (*) 70 - 99 mg/dL   Comment 1 Documented in Chart     Comment 2 Notify RN    GLUCOSE, CAPILLARY     Status: Abnormal   Collection Time    04/16/14  8:24 PM      Result Value Ref Range   Glucose-Capillary 127 (*) 70 - 99 mg/dL  GLUCOSE, CAPILLARY     Status: Abnormal   Collection Time    04/17/14  6:01 AM      Result Value Ref Range   Glucose-Capillary 125 (*) 70 - 99 mg/dL  GLUCOSE, CAPILLARY     Status: Abnormal   Collection Time    04/17/14 12:03 PM      Result Value Ref Range   Glucose-Capillary 118 (*) 70 - 99 mg/dL   Comment 1 Notify RN      Physical  Findings: AIMS: Facial and Oral Movements Muscles of Facial Expression: None, normal Lips and Perioral Area: None, normal Jaw: None, normal Tongue: None, normal,Extremity Movements Upper (arms, wrists, hands, fingers): None, normal Lower (legs, knees, ankles, toes): None, normal, Trunk Movements Neck, shoulders, hips: None, normal, Overall Severity Severity of abnormal movements (highest score from questions above): None, normal Incapacitation due to abnormal movements: None, normal Patient's awareness of abnormal movements (rate only patient's report): No Awareness, Dental Status Current problems with teeth and/or dentures?: No Does patient usually wear dentures?: No  CIWA:  CIWA-Ar Total: 1 COWS:     Treatment Plan Summary: Daily contact with patient to assess and evaluate symptoms and progress in treatment Medication management  Assessment: Some improvement noted. Patient has CKD as well as has pedal edema . IM seen patient recommended 2d echo , BNP (elevated)  as well as referral to [facility] for monitoring his renal function. Patient however has been refusing the echo and reports he has done it all and does not want to spend more of his money.  Plan: Will continue  Haldol 15 mg po /IM . (Patient on  Forced medication order). Will be cautious in increasing the dose 2/2 his  multiple medical issues. Second opinion and forced med order In chart.   Will continue cogentin for EPS.  Will continue  lamictal 25 mg po daily.  Will continue gabapentin 400 mg po bid which will help with pain as well as mood swings.  Will continue trazodone 100 mg for sleep.  Will continue current medication as scheduled for his medical issues.  CSW will work on disposition.  Will make prns available for agitation. For DM ,will continue current medications as scheduled. For back pain,continue pain management (topical diclofenac gel,warm compress) For laceration on right sided elbow - will add  bacitracin topical. Continue current plan of care with no changes at this time. Marlane Hatcher. Cing  RPAC 3:14 PM 04/17/2014          Medical Decision Making Problem Points:  Established problem, worsening (2), New problem, with no additional work-up planned (3) and Review of psycho-social stressors (1) Data Points:  Order Aims Assessment (2) Review of medication regiment & side effects (2) Review of new medications or change in dosage (2)  I certify that inpatient services furnished can reasonably be expected to improve the patient's condition.   Ramere Downs 04/17/2014, 3:05 PM

## 2014-04-17 NOTE — Progress Notes (Signed)
D: Patient pleasant this evening. No new complaint. Stated "things are pretty good for me today". Denied SI, AH/VH. Patient passively c/o of right eye pain. Refused pain medication stated "It's nothing" Complied with medications and treatments. CBG 116 mg/dl. No coverage per sliding scale. A: Patient encouraged to continued with the treatment regimen. Offered support and encouragement. Encouraged to verbalize needs to staff. Medications given as ordered. R: Patient very receptive. Will continue to monitor patient.

## 2014-04-17 NOTE — Progress Notes (Signed)
[facility] Group Notes:  (Nursing/MHT/Case Management/Adjunct)  Date:  04/17/2014  Time:  2:52 PM  Type of Therapy:  Psychoeducational Skills  Participation Level:  None  Participation Quality:  None  Affect:  Appropriate  Cognitive:  Appropriate  Insight:  None  Engagement in Group:  Engaged  Modes of Intervention:  Activity  Summary of Progress/Problems: Pts played Pictionary using coping skills. Pt attended group and appeared to be engaged however he did not participate.  Clint Bolder 04/17/2014, 2:52 PM

## 2014-04-17 NOTE — BHH Group Notes (Signed)
[facility] Group Notes:  (Nursing/MHT/Case Management/Adjunct)  Date:  04/17/2014  Time:  5:18 PM  Type of Therapy:  Psychoeducational Skills  Participation Level:  Did Not Attend  Ventura Sellers 04/17/2014, 5:18 PM

## 2014-04-17 NOTE — Progress Notes (Signed)
Patient ID: Ivan [name], male   DOB: [birth year], 54 y.o.   MRN: [id number] D. Patient presents with irritable mood, affect congruent again today. Ivan [name] is pleasant superficially, but is not forthcoming with thoughts/concerns to Probation officer. He states '' I guess i'm doing okay, just up in here on a Saturday. ''  Patient continued to encourage to elevate legs due to swelling, he states '' concrete floor isn't helping anything. '' Pt did complete self inventory and rates his depression at 9/10 on depression scale, 10 being worst. He denies any SI/HI or A/V Hallucinations. A. Medications given as ordered. Support and encouragement provided. Discussed above and patients progress with N. Mashburn PAC. R. Patient denies any other acute concerns, and is in no acute distress at this time. Will continue to monitor q 15 minutes for safety.

## 2014-04-17 NOTE — BHH Group Notes (Signed)
[facility] Group Notes:  (Clinical Social Work)  04/17/2014  11:15-12:00PM  Summary of Progress/Problems:   The main focus of today's process group was to discuss patients' feelings about hospitalization, the stigma attached to mental health, and sources of motivation to stay well.  We then worked to identify a specific plan to avoid future hospitalizations when discharged from the hospital for this admission.  The patient was drowsy throughout group.  Type of Therapy:  Group Therapy - Process  Participation Level:  Minimal  Participation Quality:  Drowsy  Affect:  Blunted  Cognitive:  Oriented  Insight:  Limited  Engagement in Therapy:  Limited  Modes of Intervention:  Exploration, Discussion  Selmer Dominion, LCSW 04/17/2014, 4:34 PM

## 2014-04-17 NOTE — BHH Group Notes (Signed)
[facility] Group Notes:  (Nursing/MHT/Case Management/Adjunct)  Date:  04/17/2014  Time:  10:55 AM  Type of Therapy:  Psychoeducational Skills  Participation Level:  Did Not Attend  Ventura Sellers 04/17/2014, 10:55 AM

## 2014-04-17 NOTE — Progress Notes (Signed)
I reviewed the note and agree with the A&P.

## 2014-04-17 NOTE — Progress Notes (Signed)
The focus of this group is to help patients review their daily goal of treatment and discuss progress on daily workbooks. Pt attended the evening group session but appeared to be asleep for most of it. Pt did respond to discussion questions when woken and prompted. Pt told the Writer that today was a good day, the highlight of which was getting to go to the courtyard and feel the sun on his face. "I hope we can go again tomorrow." Pt told the Writer his only additional needs this evening were for towels and hospital gowns, both of which were given to him following group. Pt's affect when awake was appropriate.

## 2014-04-18 LAB — GLUCOSE, CAPILLARY
GLUCOSE-CAPILLARY: 129 mg/dL — AB (ref 70–99)
GLUCOSE-CAPILLARY: 144 mg/dL — AB (ref 70–99)
Glucose-Capillary: 128 mg/dL — ABNORMAL HIGH (ref 70–99)
Glucose-Capillary: 80 mg/dL (ref 70–99)

## 2014-04-18 NOTE — Progress Notes (Signed)
Adult Psychoeducational Group Note  Date:  04/18/2014 Time:  9:33 PM  Group Topic/Focus:  Wrap-Up Group:   The focus of this group is to help patients review their daily goal of treatment and discuss progress on daily workbooks.  Participation Level:  Active  Participation Quality:  Appropriate  Affect:  Appropriate  Cognitive:  Appropriate  Insight: Appropriate  Engagement in Group:  Engaged  Modes of Intervention:  Discussion  Additional Comments:  The patient expressed he learn in group about fitness and eating healthy.The patient also said that his day was good.  Nash Shearer 04/18/2014, 9:33 PM

## 2014-04-18 NOTE — Progress Notes (Signed)
D:Patient denied SI, AH/VH. Patient denied pain at this time. No new complaint. Patient appeared sleepy and nonchalant when talking to him. A: Medication given as ordered. Encouraged patient to be compliant with his medications and treatments.

## 2014-04-18 NOTE — Progress Notes (Signed)
Patient ID: KNOXTON ANSLEY, male   DOB: 09-11-1960, 53 y.o.   MRN: [id number] D. Patient presents with irritable mood, affect congruent again today.  Tonia denies any acute concerns, and continues to state '' doing fine, just want to find out when I can go home. '' He remains guarded, but is cooperative with staff. He denies any SI/HI or A/V Hallucinations. A. Medications given as ordered. Support and encouragement provided. Discussed above and patients progress with Reginold Agent NP. R. Patient denies any other acute concerns, and is in no acute distress at this time. Will continue to monitor q 15 minutes for safety.

## 2014-04-18 NOTE — BHH Group Notes (Addendum)
[facility] Group Notes:  (Nursing/MHT/Case Management/Adjunct)  Date:  04/18/2014  Time:  11:16 AM  Type of Therapy:  Nurse Education  Participation Level:  Did Not Attend  Participation Quality:  DID NOT ATTEND  Affect:  DID NOT ATTEND  Cognitive:  DID NOT ATTEND  Insight:  None DID NOT ATTEND  Engagement in Group:  DID NOT ATTEND  Modes of Intervention:  DID NOT ATTEND  Summary of Progress/Problems: DID NOT ATTEND SELF INVENTORY GROUP.   Ivan [name] 04/18/2014, 11:16 AM

## 2014-04-18 NOTE — BHH Group Notes (Signed)
[facility] Group Notes:  (Nursing/MHT/Case Management/Adjunct)  Date:  04/18/2014  Time:  11:21 AM  Type of Therapy:  Nurse Education  Participation Level:  Did Not Attend  Participation Quality:  DID NOT ATTEND  Affect:  DID NOT ATTEND  Cognitive:  DID NOT ATTEND  Insight:  None DID NOT ATTEND  Engagement in Group:  DID NOT ATTEND  Modes of Intervention:  DID NOT ATTEND  Summary of Progress/Problems: DID NOT ATTEND HEALTHY SUPPORT SYSTEMS GROUP.   Benancio Deeds Shanta 04/18/2014, 11:21 AM

## 2014-04-18 NOTE — BHH Group Notes (Signed)
[facility] Group Notes:  (Clinical Social Work)  04/18/2014   11:15am-12:00pm  Summary of Progress/Problems:  The main focus of today's process group was to listen to a variety of genres of music and to identify that different types of music provoke different responses.  The patient then was able to identify personally what was soothing for them, as well as energizing.  Handouts were used to record feelings evoked, as well as how patient can personally use this knowledge in sleep habits, with depression, and with other symptoms.  The patient expressed little in group, appeared to be sleeping, but at times did mumble whether he liked a song.  Type of Therapy:  Music Therapy   Participation Level:  Active  Participation Quality:  Drowsy  Affect:  Blunted  Cognitive:  Oriented  Insight:  Engaged  Engagement in Therapy:  Engaged  Modes of Intervention:   Activity, Exploration  Selmer Dominion, LCSW 04/18/2014, 12:30pm

## 2014-04-18 NOTE — Progress Notes (Signed)
Patient ID: Ivan [name], male   DOB: [birth year], 53 y.o.   MRN: [id number] [facility] MD Progress Note  04/18/2014 11:17 AM Ivan [name]  MRN:  [id number] Subjective: " I am doing okay"  Objective:  Patient was seen and evaluated in his room.  He was more cooperative and calm.  He answered all questions promptly.  He reported doing well and taking his medications.  He reported good night sleep and appetite.  Patient spends more time in his room instead of participating in group.  Patient states his swollen leg is as a result of cellulitis and he believes he does not need any further work up by the hospitalist.  We will continue to encourage patient to participate in group therapy and to continue taking his medications. Diagnosis:   DSM5:  Primary psychiatric diagnosis:  Schizophrenia,multiple episodes,currently in acute episode   Secondary psychiatric diagnosis:  Non compliance with medical treatment / Medications.  Non psychiatric diagnosis:  Diabetes Mellitus  Hypertension  Chronic back pain  Pedal edema   ADL's:  Intact  Sleep:  good Appetite:  good  Suicidal Ideation:  Denies  Homicidal Ideation:  Denies  Psychiatric Specialty Exam: Physical Exam  Constitutional: He is oriented to person, place, and time. He appears well-developed and well-nourished.  HENT:  Head: Normocephalic and atraumatic.  Neurological: He is alert and oriented to person, place, and time.  Skin: Skin is warm and dry.  Laceration to right sided elbow pos-healing    Review of Systems  Cardiovascular: Positive for leg swelling.  Musculoskeletal: Positive for back pain.  Neurological: Negative.   Psychiatric/Behavioral: The patient is nervous/anxious and has insomnia.     Blood pressure 118/69, pulse 75, temperature 98.1 F (36.7 C), temperature source Oral, resp. rate 20, height 6' 1.5" (1.867 m), weight 137.44 kg (303 lb).Body mass index is 39.43 kg/(m^2).  General Appearance: Fairly Groomed   Engineer, water::  fair  Speech:  Normal Rate and Slow  Volume:  Decreased  Mood:  Less irritable  Affect:  Constricted guarded  Thought Process:  Goal directed  Orientation:  Full (Time, Place, and Person)  Thought Content:  Delusions and Paranoid Ideation denies any paranoia but appears to guarded ,suspicious  Suicidal Thoughts:  No  Homicidal Thoughts:  No  Memory:  Negative  Judgement:  Impaired  Insight:  Lacking  Psychomotor Activity:  Decreased  Concentration:  Fair  Recall:  Poor  Fund of Knowledge:Poor  Language: Poor  Akathisia:  No    AIMS (if indicated):   0  Assets:  Others:  access to health care  Sleep:  Number of Hours: 4.5   Musculoskeletal: Strength & Muscle Tone: Sitting on a chair in his room Gait & Station: Did not witness patient walking Patient leans: Was sitting during the assessment  Current Medications: Current Facility-Administered Medications  Medication Dose Route Frequency Provider Last Rate Last Dose  . acetaminophen (TYLENOL) tablet 650 mg  650 mg Oral Q6H PRN Laverle Hobby, PA-C   650 mg at 04/15/14 1346  . alum & mag hydroxide-simeth (MAALOX/MYLANTA) 200-200-20 MG/5ML suspension 30 mL  30 mL Oral Q4H PRN Laverle Hobby, PA-C      . amLODipine (NORVASC) tablet 5 mg  5 mg Oral BID Laverle Hobby, PA-C   5 mg at 04/17/14 0747  . aspirin EC tablet 81 mg  81 mg Oral Daily Laverle Hobby, PA-C   81 mg at 04/17/14 0747  . benztropine (COGENTIN) tablet 1  mg  1 mg Oral BID Ursula Alert, MD   1 mg at 04/17/14 0746   Or  . benztropine mesylate (COGENTIN) injection 1 mg  1 mg Intramuscular BID Ursula Alert, MD      . cloNIDine (CATAPRES) tablet 0.1 mg  0.1 mg Oral BID Laverle Hobby, PA-C   0.1 mg at 04/17/14 0747  . diclofenac sodium (VOLTAREN) 1 % transdermal gel 2 g  2 g Topical QID Ursula Alert, MD   2 g at 04/17/14 0747  . furosemide (LASIX) tablet 40 mg  40 mg Oral BID Laverle Hobby, PA-C   40 mg at 04/17/14 0747  . gabapentin  (NEURONTIN) capsule 400 mg  400 mg Oral TID Laverle Hobby, PA-C   400 mg at 04/17/14 1150  . glipiZIDE (GLUCOTROL) tablet 5 mg  5 mg Oral QAC breakfast Laverle Hobby, PA-C   5 mg at 04/17/14 [id number]  . haloperidol (HALDOL) tablet 10 mg  10 mg Oral QPM Ursula Alert, MD   10 mg at 04/16/14 1615   Or  . haloperidol lactate (HALDOL) injection 10 mg  10 mg Intramuscular QPM Saramma Eappen, MD      . haloperidol (HALDOL) tablet 5 mg  5 mg Oral Daily Saramma Eappen, MD   5 mg at 04/17/14 0746   Or  . haloperidol lactate (HALDOL) injection 5 mg  5 mg Intramuscular Daily Saramma Eappen, MD      . hydrALAZINE (APRESOLINE) tablet 100 mg  100 mg Oral TID Laverle Hobby, PA-C   100 mg at 04/17/14 1150  . insulin aspart (novoLOG) injection 0-20 Units  0-20 Units Subcutaneous TID WC Laverle Hobby, PA-C   3 Units at 04/17/14 [phone]  . insulin aspart (novoLOG) injection 0-5 Units  0-5 Units Subcutaneous QHS Spencer E Simon, PA-C      . labetalol (NORMODYNE) tablet 200 mg  200 mg Oral BID Laverle Hobby, PA-C   200 mg at 04/17/14 0747  . lamoTRIgine (LAMICTAL) tablet 25 mg  25 mg Oral QPM Saramma Eappen, MD   25 mg at 04/16/14 1615  . magnesium hydroxide (MILK OF MAGNESIA) suspension 30 mL  30 mL Oral Daily PRN Laverle Hobby, PA-C      . neomycin-bacitracin-polymyxin (NEOSPORIN) ointment   Topical BID Mojeed Akintayo   15 application at [id number]  . nicotine polacrilex (NICORETTE) gum 2 mg  2 mg Oral PRN Mojeed Akintayo   2 mg at 04/16/14 2302  . OLANZapine zydis (ZYPREXA) disintegrating tablet 5 mg  5 mg Oral BID PRN Ursula Alert, MD   5 mg at 04/15/14 [id number]   Or  . OLANZapine (ZYPREXA) injection 5 mg  5 mg Intramuscular BID PRN Ursula Alert, MD      . traZODone (DESYREL) tablet 100 mg  100 mg Oral QHS Ursula Alert, MD   100 mg at 04/16/14 2118  . Vitamin D (Ergocalciferol) (DRISDOL) capsule 50,000 Units  50,000 Units Oral Q21 days Laverle Hobby, PA-C        Lab Results:  Results for orders  placed during the hospital encounter of 04/13/14 (from the past 48 hour(s))  GLUCOSE, CAPILLARY     Status: Abnormal   Collection Time    04/15/14  4:54 PM      Result Value Ref Range   Glucose-Capillary 105 (*) 70 - 99 mg/dL  PRO B NATRIURETIC PEPTIDE     Status: Abnormal   Collection Time    04/15/14  7:12 PM      Result Value Ref Range   Pro B Natriuretic peptide (BNP) 167.0 (*) 0 - 125 pg/mL   Comment: Performed at [place], CAPILLARY     Status: Abnormal   Collection Time    04/15/14  9:08 PM      Result Value Ref Range   Glucose-Capillary 141 (*) 70 - 99 mg/dL  GLUCOSE, CAPILLARY     Status: Abnormal   Collection Time    04/15/14  9:40 PM      Result Value Ref Range   Glucose-Capillary 124 (*) 70 - 99 mg/dL  GLUCOSE, CAPILLARY     Status: Abnormal   Collection Time    04/16/14  6:20 AM      Result Value Ref Range   Glucose-Capillary 124 (*) 70 - 99 mg/dL  GLUCOSE, CAPILLARY     Status: Abnormal   Collection Time    04/16/14 11:35 AM      Result Value Ref Range   Glucose-Capillary 100 (*) 70 - 99 mg/dL  GLUCOSE, CAPILLARY     Status: Abnormal   Collection Time    04/16/14  5:05 PM      Result Value Ref Range   Glucose-Capillary 121 (*) 70 - 99 mg/dL   Comment 1 Documented in Chart     Comment 2 Notify RN    GLUCOSE, CAPILLARY     Status: Abnormal   Collection Time    04/16/14  8:24 PM      Result Value Ref Range   Glucose-Capillary 127 (*) 70 - 99 mg/dL  GLUCOSE, CAPILLARY     Status: Abnormal   Collection Time    04/17/14  6:01 AM      Result Value Ref Range   Glucose-Capillary 125 (*) 70 - 99 mg/dL  GLUCOSE, CAPILLARY     Status: Abnormal   Collection Time    04/17/14 12:03 PM      Result Value Ref Range   Glucose-Capillary 118 (*) 70 - 99 mg/dL   Comment 1 Notify RN      Physical Findings: AIMS: Facial and Oral Movements Muscles of Facial Expression: None, normal Lips and Perioral Area: None, normal Jaw: None,  normal Tongue: None, normal,Extremity Movements Upper (arms, wrists, hands, fingers): None, normal Lower (legs, knees, ankles, toes): None, normal, Trunk Movements Neck, shoulders, hips: None, normal, Overall Severity Severity of abnormal movements (highest score from questions above): None, normal Incapacitation due to abnormal movements: None, normal Patient's awareness of abnormal movements (rate only patient's report): No Awareness, Dental Status Current problems with teeth and/or dentures?: No Does patient usually wear dentures?: No  CIWA:  CIWA-Ar Total: 1 COWS:     Treatment Plan Summary: Daily contact with patient to assess and evaluate symptoms and progress in treatment Medication management  Assessment: Patient was seen in his room sitting and resting.  He reported doing well and that his swollen leg is as a result of cellulitis.  Asked if he is taking medication for cellulitis patient stated" I am taking a lot of medications, I am taking my medications"  Patient promised he will continue to take his medications.  Plan: Continue with plan of care Continue crisis management Encourage to participate in group and individual sessions Continue medication management/ and review as needed Forced medication is in place incase patient refuses his medications We will continue to offer Trazodone 100 mg po at bed time for sleep Olanzapine 10 mg po bid  prn for agitation.  May use injection if po is refused Lamotrigine 25 mg po at pm for mood stabilization Haldol 5 mg po daily or 5 mg injection daily  if oral route is refused Haldol 10 mg po at pm or 10 mg injection IM if oral route is refused. Benztropine 1 mg po bid or 1 mg IM injection if oral route is refused. Discharge  Plan in progress Address health issues /V/S as needed    Medical Decision Making Problem Points:  Established problem, worsening (2), New problem, with no additional work-up planned (3) and Review of psycho-social  stressors (1) Data Points:  Order Aims Assessment (2) Review of medication regiment & side effects (2) Review of new medications or change in dosage (2)  I certify that inpatient services furnished can reasonably be expected to improve the patient's condition.   Charmaine Downs, C   PMHNP-BC 04/18/2014, 11:17 AM

## 2014-04-18 NOTE — Progress Notes (Signed)
I have reviewed the note and agree with the A&P.

## 2014-04-18 NOTE — Progress Notes (Signed)
Adult Activity Group Note   Date: 04/18/2014 Time: 11:15am   Group Topic: Wellness - Jeopardy   Participation Level: Active  Participation Quality: Appropriate  Affect: Appropriate  Activity: Patients participated in jeopardy like game, answering questions relating to wellness in categories such as Fruits, Fitness, Veggies, Exercise, and Food Safety   Additional comments:

## 2014-04-19 LAB — GLUCOSE, CAPILLARY
GLUCOSE-CAPILLARY: 107 mg/dL — AB (ref 70–99)
GLUCOSE-CAPILLARY: 164 mg/dL — AB (ref 70–99)
Glucose-Capillary: 106 mg/dL — ABNORMAL HIGH (ref 70–99)
Glucose-Capillary: 165 mg/dL — ABNORMAL HIGH (ref 70–99)

## 2014-04-19 MED ORDER — LAMOTRIGINE 25 MG PO TABS
50.0000 mg | ORAL_TABLET | Freq: Every evening | ORAL | Status: DC
  Administered 2014-04-19: 50 mg via ORAL
  Filled 2014-04-19: qty 2
  Filled 2014-04-19 (×2): qty 6
  Filled 2014-04-19: qty 2

## 2014-04-19 NOTE — Progress Notes (Signed)
Patient ID: Ivan [name], male   DOB: 1961/07/16, 53 y.o.   MRN: [id number]  D: Pt was asleep sitting in a chair in his room prior to the assessment. Writer asked the pt about his stay since their encounter last week. Pt stated, "[facility] served papers on me". Pt stated he's going to back to [facility] discussed pending discharge with pt. Pt stated, "I'm trying to stay away from confusion". Writer asked pt if there was something that happened on the unit. Pt stated, "no, where I live at".   A:  Support and encouragement was offered. 15 min checks continued for safety.  R: Pt remains safe.

## 2014-04-19 NOTE — [facility] Suicide Risk Assessment (Signed)
[place] INPATIENT:  Family/Significant Other Suicide Prevention Education  Suicide Prevention Education:  Education Completed; No one has been identified by the patient as the family member/significant other with whom the patient will be residing, and identified as the person(s) who will aid the patient in the event of a mental health crisis (suicidal ideations/suicide attempt).  With written consent from the patient, the family member/significant other has been provided the following suicide prevention education, prior to the and/or following the discharge of the patient.  The suicide prevention education provided includes the following:  Suicide risk factors  Suicide prevention and interventions  National Suicide Hotline telephone number  [facility] assessment telephone number  [facility] Emergency Assistance [place] and/or Residential Mobile Crisis Unit telephone number  Request made of family/significant other to:  Remove weapons (e.g., guns, rifles, knives), all items previously/currently identified as safety concern.    Remove drugs/medications (over-the-counter, prescriptions, illicit drugs), all items previously/currently identified as a safety concern.  The family member/significant other verbalizes understanding of the suicide prevention education information provided.  The family member/significant other agrees to remove the items of safety concern listed above.  The patient did not endorse SI at the time of admission, nor did the patient c/o SI during the stay here.  SPE not required.   [name] 04/19/2014, 10:23 AM

## 2014-04-19 NOTE — Progress Notes (Signed)
Patient ID: Ivan [name], male   DOB: [birth year], 53 y.o.   MRN: [id number] CSW contacted [place] office to schedule transportation for Pt at discharge.  Transportation scheduled for 04/20/14 at [place], [place] 04/19/2014 9:09 AM

## 2014-04-19 NOTE — Progress Notes (Signed)
D:  Patient's self inventory sheet, patient has fair sleep, no sleep medication needed.  Good appetite, normal energy level, good concentration.  Rated depression and anxiety 8, hopeless 6.  Has experienced withdrawals.  Denied SI.  Denied physical problem.  Did state that he has back/arm pain, worst pain #9.  Does take pain medication which has been helpful.  Plans to go home and take medications.  Does have discharge plans.   A:  Medications given per MD orders.  Emotional support and encouragement given patient. R:  Denied SI and HI, contracts for safety.  Denied A/V hallucinations.  Safety maintained with 15 minute checks.

## 2014-04-19 NOTE — Progress Notes (Signed)
Patient ID: Ivan [name], male   DOB: [birth year], 53 y.o.   MRN: [id number] [facility] MD Progress Note  04/19/2014 11:13 AM DAVELLE BEITZEL  MRN:  [id number] Subjective: " I am fine"  Objective:  Patient was seen and evaluated in his room.Patients chart reviewed. Patient found on his recliner with eyes closed. He continues to be irritable but improving.He reports having a good weekend. He denies AH/VH/SI/HI. Patient is compliant on his medications. But when it was discussed with patient that he needs to follow up with nephrology for management of his renal issues,patient declines and reports he does not want to. He reported good night sleep and appetite. However he reports that he continues to be in pain.   Per staff patient appears to be irritable and grumpy today that he cannot leave today.    Diagnosis:   DSM5:  Primary psychiatric diagnosis:  Schizophrenia,multiple episodes,currently in acute episode   Secondary psychiatric diagnosis:  Non compliance with medical treatment / Medications.  Non psychiatric diagnosis:  Diabetes Mellitus  Hypertension  Chronic back pain  Pedal edema   ADL's:  Intact  Sleep:  good Appetite:  good  Suicidal Ideation:  Denies  Homicidal Ideation:  Denies  Psychiatric Specialty Exam: Physical Exam  Constitutional: He is oriented to person, place, and time. He appears well-developed and well-nourished.  HENT:  Head: Normocephalic and atraumatic.  Neurological: He is alert and oriented to person, place, and time.  Skin: Skin is warm and dry.  Laceration to right sided elbow pos-healing  Psychiatric: His speech is normal. Judgment normal. His mood appears anxious. His affect is angry and labile. He is slowed and withdrawn. Thought content is paranoid (improving). Cognition and memory are normal. He is inattentive.    Review of Systems  Cardiovascular: Positive for leg swelling.  Musculoskeletal: Positive for back pain.  Neurological: Negative.    Psychiatric/Behavioral: The patient is nervous/anxious and has insomnia.     Blood pressure 127/81, pulse 70, temperature 98 F (36.7 C), temperature source Oral, resp. rate 18, height 6' 1.5" (1.867 m), weight 137.44 kg (303 lb).Body mass index is 39.43 kg/(m^2).  General Appearance: Fairly Groomed  Engineer, water::  fair  Speech:  Normal Rate and Slow  Volume:  Decreased  Mood:  Less irritable  Affect:  Constricted guarded  Thought Process:  Goal directed  Orientation:  Full (Time, Place, and Person)  Thought Content:  Delusions and Paranoid Ideation denies any paranoia but appears to guarded ,suspicious  Suicidal Thoughts:  No  Homicidal Thoughts:  No  Memory:  Negative  Judgement:  Impaired  Insight:  Lacking  Psychomotor Activity:  Decreased  Concentration:  Fair  Recall:  Poor  Fund of Knowledge:Poor  Language: Poor  Akathisia:  No    AIMS (if indicated):   0  Assets:  Others:  access to health care  Sleep:  Number of Hours: 3.25   Musculoskeletal: Strength & Muscle Tone: Sitting on a chair in his room Gait & Station: Did not witness patient walking Patient leans: Was sitting during the assessment  Current Medications: Current Facility-Administered Medications  Medication Dose Route Frequency Provider Last Rate Last Dose  . acetaminophen (TYLENOL) tablet 650 mg  650 mg Oral Q6H PRN Laverle Hobby, PA-C   650 mg at 04/15/14 1346  . alum & mag hydroxide-simeth (MAALOX/MYLANTA) 200-200-20 MG/5ML suspension 30 mL  30 mL Oral Q4H PRN Laverle Hobby, PA-C      . amLODipine (NORVASC) tablet 5 mg  5 mg Oral BID Laverle Hobby, PA-C   5 mg at 04/17/14 0747  . aspirin EC tablet 81 mg  81 mg Oral Daily Laverle Hobby, PA-C   81 mg at 04/17/14 0747  . benztropine (COGENTIN) tablet 1 mg  1 mg Oral BID Ursula Alert, MD   1 mg at 04/17/14 0746   Or  . benztropine mesylate (COGENTIN) injection 1 mg  1 mg Intramuscular BID Ursula Alert, MD      . cloNIDine (CATAPRES) tablet  0.1 mg  0.1 mg Oral BID Laverle Hobby, PA-C   0.1 mg at 04/17/14 0747  . diclofenac sodium (VOLTAREN) 1 % transdermal gel 2 g  2 g Topical QID Ursula Alert, MD   2 g at 04/17/14 0747  . furosemide (LASIX) tablet 40 mg  40 mg Oral BID Laverle Hobby, PA-C   40 mg at 04/17/14 0747  . gabapentin (NEURONTIN) capsule 400 mg  400 mg Oral TID Laverle Hobby, PA-C   400 mg at 04/17/14 1150  . glipiZIDE (GLUCOTROL) tablet 5 mg  5 mg Oral QAC breakfast Laverle Hobby, PA-C   5 mg at 04/17/14 [id number]  . haloperidol (HALDOL) tablet 10 mg  10 mg Oral QPM Ursula Alert, MD   10 mg at 04/16/14 1615   Or  . haloperidol lactate (HALDOL) injection 10 mg  10 mg Intramuscular QPM Owain Eckerman, MD      . haloperidol (HALDOL) tablet 5 mg  5 mg Oral Daily Jahir Halt, MD   5 mg at 04/17/14 0746   Or  . haloperidol lactate (HALDOL) injection 5 mg  5 mg Intramuscular Daily Suri Tafolla, MD      . hydrALAZINE (APRESOLINE) tablet 100 mg  100 mg Oral TID Laverle Hobby, PA-C   100 mg at 04/17/14 1150  . insulin aspart (novoLOG) injection 0-20 Units  0-20 Units Subcutaneous TID WC Laverle Hobby, PA-C   3 Units at 04/17/14 [phone]  . insulin aspart (novoLOG) injection 0-5 Units  0-5 Units Subcutaneous QHS Spencer E Simon, PA-C      . labetalol (NORMODYNE) tablet 200 mg  200 mg Oral BID Laverle Hobby, PA-C   200 mg at 04/17/14 0747  . lamoTRIgine (LAMICTAL) tablet 25 mg  25 mg Oral QPM Darlene Brozowski, MD   25 mg at 04/16/14 1615  . magnesium hydroxide (MILK OF MAGNESIA) suspension 30 mL  30 mL Oral Daily PRN Laverle Hobby, PA-C      . neomycin-bacitracin-polymyxin (NEOSPORIN) ointment   Topical BID Mojeed Akintayo   15 application at [phone]  . nicotine polacrilex (NICORETTE) gum 2 mg  2 mg Oral PRN Mojeed Akintayo   2 mg at 04/16/14 2302  . OLANZapine zydis (ZYPREXA) disintegrating tablet 5 mg  5 mg Oral BID PRN Ursula Alert, MD   5 mg at 04/15/14 [id number]   Or  . OLANZapine (ZYPREXA) injection 5 mg  5 mg  Intramuscular BID PRN Ursula Alert, MD      . traZODone (DESYREL) tablet 100 mg  100 mg Oral QHS Ursula Alert, MD   100 mg at 04/16/14 2118  . Vitamin D (Ergocalciferol) (DRISDOL) capsule 50,000 Units  50,000 Units Oral Q21 days Laverle Hobby, PA-C        Lab Results:  Results for orders placed during the hospital encounter of 04/13/14 (from the past 48 hour(s))  GLUCOSE, CAPILLARY     Status: Abnormal   Collection Time  04/15/14  4:54 PM      Result Value Ref Range   Glucose-Capillary 105 (*) 70 - 99 mg/dL  PRO B NATRIURETIC PEPTIDE     Status: Abnormal   Collection Time    04/15/14  7:12 PM      Result Value Ref Range   Pro B Natriuretic peptide (BNP) 167.0 (*) 0 - 125 pg/mL   Comment: Performed at [place], CAPILLARY     Status: Abnormal   Collection Time    04/15/14  9:08 PM      Result Value Ref Range   Glucose-Capillary 141 (*) 70 - 99 mg/dL  GLUCOSE, CAPILLARY     Status: Abnormal   Collection Time    04/15/14  9:40 PM      Result Value Ref Range   Glucose-Capillary 124 (*) 70 - 99 mg/dL  GLUCOSE, CAPILLARY     Status: Abnormal   Collection Time    04/16/14  6:20 AM      Result Value Ref Range   Glucose-Capillary 124 (*) 70 - 99 mg/dL  GLUCOSE, CAPILLARY     Status: Abnormal   Collection Time    04/16/14 11:35 AM      Result Value Ref Range   Glucose-Capillary 100 (*) 70 - 99 mg/dL  GLUCOSE, CAPILLARY     Status: Abnormal   Collection Time    04/16/14  5:05 PM      Result Value Ref Range   Glucose-Capillary 121 (*) 70 - 99 mg/dL   Comment 1 Documented in Chart     Comment 2 Notify RN    GLUCOSE, CAPILLARY     Status: Abnormal   Collection Time    04/16/14  8:24 PM      Result Value Ref Range   Glucose-Capillary 127 (*) 70 - 99 mg/dL  GLUCOSE, CAPILLARY     Status: Abnormal   Collection Time    04/17/14  6:01 AM      Result Value Ref Range   Glucose-Capillary 125 (*) 70 - 99 mg/dL  GLUCOSE, CAPILLARY     Status:  Abnormal   Collection Time    04/17/14 12:03 PM      Result Value Ref Range   Glucose-Capillary 118 (*) 70 - 99 mg/dL   Comment 1 Notify RN      Physical Findings: AIMS: Facial and Oral Movements Muscles of Facial Expression: None, normal Lips and Perioral Area: None, normal Jaw: None, normal Tongue: None, normal,Extremity Movements Upper (arms, wrists, hands, fingers): None, normal Lower (legs, knees, ankles, toes): None, normal, Trunk Movements Neck, shoulders, hips: None, normal, Overall Severity Severity of abnormal movements (highest score from questions above): None, normal Incapacitation due to abnormal movements: None, normal Patient's awareness of abnormal movements (rate only patient's report): No Awareness, Dental Status Current problems with teeth and/or dentures?: No Does patient usually wear dentures?: No  CIWA:  CIWA-Ar Total: 1 COWS:     Treatment Plan Summary: Daily contact with patient to assess and evaluate symptoms and progress in treatment Medication management  Assessment: Patient was seen in his room sitting and resting.  He reported doing well and that his swollen leg is as a result of cellulitis.  Asked if he is taking medication for cellulitis patient stated" I am taking a lot of medications, I am taking my medications"  Patient promised he will continue to take his medications.  Plan: Continue with plan of care Continue crisis management Encourage to  participate in group and individual sessions Continue medication management/ and review as needed Forced medication is in place incase patient refuses his medications We will continue to offer Trazodone 100 mg po at bed time for sleep Olanzapine 10 mg po bid prn for agitation.  May use injection if po is refused Will increase Lamotrigine to 50 mg po at pm for mood stabilization Haldol 15 mg po daily or 15 mg injection daily  if oral route is refused Benztropine 1 mg po bid or 1 mg IM injection if oral  route is refused. Discharge  Plan in progress Address health issues /V/S as needed    Medical Decision Making Problem Points:  Established problem, stable/improving (1), Review of last therapy session (1) and Review of psycho-social stressors (1) Data Points:  Order Aims Assessment (2) Review of medication regiment & side effects (2) Review of new medications or change in dosage (2)  I certify that inpatient services furnished can reasonably be expected to improve the patient's condition.   Shan Valdes md 04/19/2014, 11:13 AM

## 2014-04-19 NOTE — BHH Group Notes (Signed)
[facility] LCSW Aftercare Discharge Planning Group Note   04/19/2014 11:06 AM  Participation Quality:  Minimal  Mood/Affect:  Irritable  Depression Rating:  Denies in group; on paper rates it as 7  Anxiety Rating:  Denies in group; on paper rates it as 7  Thoughts of Suicide:  No Will you contract for safety?   NA  Current AVH:  Denies  Plan for Discharge/Comments:  Ivan [name] immediately became grumpy[er] when he learned he would not be leaving until tomorrow.  When I told him it had to do with transportation and needing to give Sheriff 24 hours notice, he argued with me, telling me I was wrong and just trying to keep him here.  When asked if if he wants to follow up at [facility] or [facility], he said "neither" and refused to sign a release.  Transportation Means: sheriff  Supports: family  Ivan [name], Ivan [name]

## 2014-04-19 NOTE — Progress Notes (Signed)
Adult Psychoeducational Group Note  Date:  04/19/2014 Time:  9:34 PM  Group Topic/Focus:  Wrap-Up Group:   The focus of this group is to help patients review their daily goal of treatment and discuss progress on daily workbooks.  Participation Level:  Minimal  Participation Quality:  Drowsy  Affect:  Flat  Cognitive:  Oriented  Insight: Limited  Engagement in Group:  Limited  Modes of Intervention:  Socialization and Support  Additional Comments:  Patient attended and participated in group tonight. He reports that his day was fine. He went to his meals and went for groups. The patient was very drowsy and did not participate much.  Salley Scarlet [facility] 04/19/2014, 9:34 PM

## 2014-04-19 NOTE — BHH Group Notes (Signed)
[facility] LCSW Group Therapy  04/19/2014 1:15 pm  Type of Therapy: Process Group Therapy  Participation Level:  Active  Participation Quality:  Appropriate  Affect:  Flat  Cognitive:  Oriented  Insight:  Improving  Engagement in Group:  Limited  Engagement in Therapy:  Limited  Modes of Intervention:  Activity, Clarification, Education, Problem-solving and Support  Summary of Progress/Problems: Today's group addressed the issue of overcoming obstacles.  Patients were asked to identify their biggest obstacle post d/c that stands in the way of their on-going success, and then problem solve as to how to manage this. Ivan [name] states his biggest obstacle is his family.  Continues to externalize.  When asked about overcoming obstacles in the past, he reported that he has "made some mistakes," and when he overcame those he felt a great sense of accomplishment.    Ivan [name] 04/19/2014   2:25 PM

## 2014-04-20 LAB — GLUCOSE, CAPILLARY
Glucose-Capillary: 130 mg/dL — ABNORMAL HIGH (ref 70–99)
Glucose-Capillary: 90 mg/dL (ref 70–99)

## 2014-04-20 MED ORDER — CLONIDINE HCL 0.1 MG PO TABS: 0.1000 mg | ORAL_TABLET | Freq: Two times a day (BID) | ORAL | Status: DC

## 2014-04-20 MED ORDER — LABETALOL HCL 200 MG PO TABS: 200.0000 mg | ORAL_TABLET | Freq: Two times a day (BID) | ORAL | Status: DC

## 2014-04-20 MED ORDER — LAMOTRIGINE 25 MG PO TABS: 50.0000 mg | ORAL_TABLET | Freq: Every evening | ORAL | Status: DC

## 2014-04-20 MED ORDER — HALOPERIDOL 10 MG PO TABS: 10.0000 mg | ORAL_TABLET | Freq: Every evening | ORAL | Status: DC

## 2014-04-20 MED ORDER — TRAZODONE HCL 100 MG PO TABS: 100.0000 mg | ORAL_TABLET | Freq: Every day | ORAL | Status: DC

## 2014-04-20 MED ORDER — AMLODIPINE BESYLATE 5 MG PO TABS: 5.0000 mg | ORAL_TABLET | Freq: Two times a day (BID) | ORAL | Status: DC

## 2014-04-20 MED ORDER — FUROSEMIDE 40 MG PO TABS: 40.0000 mg | ORAL_TABLET | Freq: Two times a day (BID) | ORAL | Status: DC

## 2014-04-20 MED ORDER — ASPIRIN EC 81 MG PO TBEC: 81.0000 mg | DELAYED_RELEASE_TABLET | Freq: Every day | ORAL | Status: DC

## 2014-04-20 MED ORDER — GABAPENTIN 400 MG PO CAPS: 400.0000 mg | ORAL_CAPSULE | Freq: Three times a day (TID) | ORAL | Status: DC

## 2014-04-20 MED ORDER — VITAMIN D (ERGOCALCIFEROL) 1.25 MG (50000 UNIT) PO CAPS: 50000.0000 [IU] | ORAL_CAPSULE | ORAL | Status: DC

## 2014-04-20 MED ORDER — POTASSIUM CHLORIDE ER 10 MEQ PO TBCR: 10.0000 meq | EXTENDED_RELEASE_TABLET | Freq: Two times a day (BID) | ORAL | Status: DC

## 2014-04-20 MED ORDER — HALOPERIDOL 5 MG PO TABS: 5.0000 mg | ORAL_TABLET | Freq: Every day | ORAL | Status: DC

## 2014-04-20 MED ORDER — HYDRALAZINE HCL 100 MG PO TABS: 100.0000 mg | ORAL_TABLET | Freq: Three times a day (TID) | ORAL | Status: DC

## 2014-04-20 MED ORDER — GLIPIZIDE 5 MG PO TABS: 5.0000 mg | ORAL_TABLET | Freq: Every day | ORAL | Status: DC

## 2014-04-20 MED ORDER — BENZTROPINE MESYLATE 1 MG PO TABS: 1.0000 mg | ORAL_TABLET | Freq: Two times a day (BID) | ORAL | Status: DC

## 2014-04-20 NOTE — Tx Team (Signed)
  Interdisciplinary Treatment Plan Update   Date Reviewed:  04/20/2014  Time Reviewed:  9:55 AM  Progress in Treatment:   Attending groups: Yes Participating in groups: Yes Taking medication as prescribed: Yes  Tolerating medication: Yes Family/Significant other contact made: Yes  Patient understands diagnosis: Yes  Discussing patient identified problems/goals with staff: Yes  See initial care plan Medical problems stabilized or resolved: Yes Denies suicidal/homicidal ideation: Yes  In tx team Patient has not harmed self or others: Yes  For review of initial/current patient goals, please see plan of care.  Estimated Length of Stay:  D/C today  Reason for Continuation of Hospitalization:   New Problems/Goals identified:  N/A  Discharge Plan or Barriers:   return home, follow up outpt  Additional Comments:  Attendees:  Signature: Steva Colder, MD 04/20/2014 9:55 AM   Signature: Ripley Fraise, LCSW 04/20/2014 9:55 AM  Signature: Elmarie Shiley, NP 04/20/2014 9:55 AM  Signature: Mayra Neer, RN 04/20/2014 9:55 AM  Signature: Darrol Angel, RN 04/20/2014 9:55 AM  Signature:  04/20/2014 9:55 AM  Signature:   04/20/2014 9:55 AM  Signature:    Signature:    Signature:    Signature:    Signature:    Signature:      Scribe for Treatment Team:   Ripley Fraise, LCSW  04/20/2014 9:55 AM

## 2014-04-20 NOTE — Progress Notes (Signed)
The focus of this group is to educate the patient on the purpose and policies of crisis stabilization and provide a format to answer questions about their admission.  The group details unit policies and expectations of patients while admitted.  Patient did not attend 0900 nurse education orientation group this morning.  Patient stayed in room.  

## 2014-04-20 NOTE — BHH Suicide Risk Assessment (Signed)
   Demographic Factors:  Male, Divorced or widowed and Living alone  Total Time spent with patient: 20 minutes  Psychiatric Specialty Exam: Physical Exam  Constitutional: He appears well-developed and well-nourished.  HENT:  Head: Normocephalic.  Eyes: Pupils are equal, round, and reactive to light.  Neck: Normal range of motion.  Skin: Skin is warm.  Psychiatric: His speech is normal and behavior is normal. Judgment and thought content normal. His mood appears anxious. Cognition and memory are normal.    Review of Systems  Constitutional: Negative.   HENT: Negative.   Cardiovascular: Positive for leg swelling.  Musculoskeletal: Positive for back pain.  Neurological: Negative.   Psychiatric/Behavioral: Negative for suicidal ideas and hallucinations. The patient does not have insomnia.     Blood pressure 134/78, pulse 68, temperature 98.5 F (36.9 C), temperature source Oral, resp. rate 20, height 6' 1.5" (1.867 m), weight 137.44 kg (303 lb).Body mass index is 39.43 kg/(m^2).  General Appearance: Casual  Eye Contact::  Fair  Speech:  Normal Rate  Volume:  Normal  Mood:  Anxious  Affect:  Congruent  Thought Process:  Coherent  Orientation:  Full (Time, Place, and Person)  Thought Content:  Negative  Suicidal Thoughts:  No  Homicidal Thoughts:  No  Memory:  Immediate;   Fair Recent;   Fair Remote;   Good  Judgement:  Fair  Insight:  Fair  Psychomotor Activity:  Normal  Concentration:  Good  Recall:  [place] of Knowledge:Fair  Language: Fair  Akathisia:  No    AIMS (if indicated):   0  Assets:  Communication Skills  Sleep:  Number of Hours: 6.25    Musculoskeletal: Strength & Muscle Tone: within normal limits Gait & Station: normal Patient leans: N/A   Mental Status Per Nursing Assessment::   On Admission:     Current Mental Status by Physician: denies AH/VH/SI/HI  Loss Factors: Financial problems/change in socioeconomic status  Historical  Factors: Family history of mental illness or substance abuse and Impulsivity  Risk Reduction Factors:   Religious beliefs about death and Positive coping skills or problem solving skills  Continued Clinical Symptoms:  Chronic Pain Previous Psychiatric Diagnoses and Treatments Medical Diagnoses and Treatments/Surgeries  Cognitive Features That Contribute To Risk:  Closed-mindedness    Suicide Risk:  Minimal: No identifiable suicidal ideation.  Patients presenting with no risk factors but with morbid ruminations; may be classified as minimal risk based on the severity of the depressive symptoms  Discharge Diagnoses:  Primary psychiatric diagnosis:  Schizophrenia,multiple episodes,currently in acute episode   Secondary psychiatric diagnosis:  Non compliance with medical treatment / Medications.   Non psychiatric diagnosis:  Diabetes Mellitus  Hypertension  Chronic back pain  Pedal edema   Past Medical History  Diagnosis Date  . Diabetes mellitus   . Hypertension   . Bipolar 1 disorder   . Schizoaffective disorder   . Hypokalemia 04/02/2013  . Edema of both legs 04/07/2013    Plan Of Care/Follow-up recommendations:  Activity:  No restrictions,patient to follow up with aftercare as scheduled  Is patient on multiple antipsychotic therapies at discharge:  No   Has Patient had three or more failed trials of antipsychotic monotherapy by history:  No  Recommended Plan for Multiple Antipsychotic Therapies: NA    Ivan [name] 04/20/2014, 9:05 AM

## 2014-04-20 NOTE — Discharge Summary (Signed)
Physician Discharge Summary Note  Patient:  Ivan [name] is an 53 y.o., male MRN:  [id number] DOB:  12-26-1960 Patient phone:  [phone] (home)  Patient address:   [address] [place] [address],  Total Time spent with patient: 20 minutes  Date of Admission:  04/13/2014 Date of Discharge: 04/20/14  Reason for Admission:  Paranoia, Aggressive behavior  Discharge Diagnoses: Active Problems:   Schizophrenia  Psychiatric Specialty Exam: Physical Exam  Psychiatric: He has a normal mood and affect. His speech is normal and behavior is normal. Judgment and thought content normal. Cognition and memory are normal.    Review of Systems  Constitutional: Negative.   HENT: Negative.   Eyes: Negative.   Respiratory: Negative.   Cardiovascular: Negative.   Gastrointestinal: Negative.   Genitourinary: Negative.   Musculoskeletal: Negative.   Skin: Negative.   Neurological: Negative.   Endo/Heme/Allergies: Negative.   Psychiatric/Behavioral: Negative.     Blood pressure 134/78, pulse 68, temperature 98.5 F (36.9 C), temperature source Oral, resp. rate 20, height 6' 1.5" (1.867 m), weight 137.44 kg (303 lb).Body mass index is 39.43 kg/(m^2).  See Physician SRA                                                  Past Psychiatric History: See H&P Diagnosis:  Hospitalizations:  Outpatient Care:  Substance Abuse Care:  Self-Mutilation:  Suicidal Attempts:  Violent Behaviors:   Musculoskeletal: Strength & Muscle Tone: within normal limits Gait & Station: normal Patient leans: N/A  DSM5:  Primary psychiatric diagnosis:  Schizophrenia,multiple episodes,currently in acute episode  Secondary psychiatric diagnosis:  Non compliance with medical treatment / Medications.  Non psychiatric diagnosis:  Diabetes Mellitus  Hypertension  Chronic back pain  Pedal edema  Level of Care:  OP  Hospital Course:  Ivan [name] is a 53 year old AAM, divorced and lives  alone. He has a long history of mental illness dating back to 2001. He was diagnosed with Paranoid schizophrenia and has had several admissions to mental institutes, the last one at [facility] being in 03/2013 when he was discharged on Invega.. Patient was brought in this time ,IVCed by family for paranoid behavior . Patient on evaluation appears to be very labile,aggressive,combative ,unwilling to cooperate . Patient reports back pain and hence would not sit down for an evaluation and hence majority of the eval was done with patient standing ,however he was unwilling to cooperate even then. Per IVC petition patient has been non compliant on medications,not going to his appointments with his psychiatrist at [facility]. He also has been very paranoid and threatening to crush everything with a bull dozer. Family worried since his father owns a Doctor, general practice.          Ivan [name] was admitted to the adult 400 unit. He was evaluated and his symptoms were identified. Medication management was discussed and initiated. The patient was started on Haldol for psychosis and Lamictal for improved mood stability. He was placed on forced medication order after second opinion was obtained due to refusing oral medications. The patient was to receive IM injections if he refused the oral medications.  He was oriented to the unit and encouraged to participate in unit programming. Medical problems were identified and treated appropriately. An Internal Medicine consult was obtained to evaluate pedal edema. He refused an echo  that was recommended due to financial concerns. The patient was informed that he needed follow up for his renal issues. His Diabetes was treated with Novolog SSI and home medication of Glipizide. However, the patient declined. Home medication was restarted as needed.        The patient was evaluated each day by a clinical provider to ascertain the patient's response to treatment. Nursing staff noted that the patient's mood  could be very irritable at times.  Improvement was noted by the patient's report of decreasing symptoms, improved sleep and appetite, affect, medication tolerance, behavior, and participation in unit programming.  He was asked each day to complete a self inventory noting mood, mental status, pain, new symptoms, anxiety and concerns.         He responded well to medication and being in a therapeutic and supportive environment. Positive and appropriate behavior was noted and the patient was motivated for recovery.  The patient worked closely with the treatment team and case manager to develop a discharge plan with appropriate goals. Coping skills, problem solving as well as relaxation therapies were also part of the unit programming.         By the day of discharge he was in much improved condition than upon admission.  Symptoms were reported as significantly decreased or resolved completely.  The patient denied SI/HI and voiced no AVH. He was motivated to continue taking medication with a goal of continued improvement in mental health.  Ivan [name] was discharged home with a plan to follow up as noted below.  Consults:  psychiatry  Significant Diagnostic Studies:    Discharge Vitals:   Blood pressure 134/78, pulse 68, temperature 98.5 F (36.9 C), temperature source Oral, resp. rate 20, height 6' 1.5" (1.867 m), weight 137.44 kg (303 lb). Body mass index is 39.43 kg/(m^2). Lab Results:   Results for orders placed during the hospital encounter of 04/13/14 (from the past 72 hour(s))  GLUCOSE, CAPILLARY     Status: Abnormal   Collection Time    04/17/14 12:03 PM      Result Value Ref Range   Glucose-Capillary 118 (*) 70 - 99 mg/dL   Comment 1 Notify RN    GLUCOSE, CAPILLARY     Status: Abnormal   Collection Time    04/17/14  4:58 PM      Result Value Ref Range   Glucose-Capillary 142 (*) 70 - 99 mg/dL   Comment 1 Notify RN    GLUCOSE, CAPILLARY     Status: Abnormal   Collection Time     04/17/14  8:55 PM      Result Value Ref Range   Glucose-Capillary 136 (*) 70 - 99 mg/dL   Comment 1 Notify RN    GLUCOSE, CAPILLARY     Status: Abnormal   Collection Time    04/18/14  6:00 AM      Result Value Ref Range   Glucose-Capillary 128 (*) 70 - 99 mg/dL   Comment 1 Documented in Chart    GLUCOSE, CAPILLARY     Status: None   Collection Time    04/18/14 11:48 AM      Result Value Ref Range   Glucose-Capillary 80  70 - 99 mg/dL   Comment 1 Notify RN    GLUCOSE, CAPILLARY     Status: Abnormal   Collection Time    04/18/14  5:09 PM      Result Value Ref Range   Glucose-Capillary 129 (*) 70 -  99 mg/dL   Comment 1 Notify RN    GLUCOSE, CAPILLARY     Status: Abnormal   Collection Time    04/18/14  8:52 PM      Result Value Ref Range   Glucose-Capillary 144 (*) 70 - 99 mg/dL  GLUCOSE, CAPILLARY     Status: Abnormal   Collection Time    04/19/14  6:12 AM      Result Value Ref Range   Glucose-Capillary 106 (*) 70 - 99 mg/dL  GLUCOSE, CAPILLARY     Status: Abnormal   Collection Time    04/19/14 11:56 AM      Result Value Ref Range   Glucose-Capillary 107 (*) 70 - 99 mg/dL  GLUCOSE, CAPILLARY     Status: Abnormal   Collection Time    04/19/14  4:20 PM      Result Value Ref Range   Glucose-Capillary 164 (*) 70 - 99 mg/dL   Comment 1 Documented in Chart     Comment 2 Notify RN    GLUCOSE, CAPILLARY     Status: Abnormal   Collection Time    04/19/14  8:38 PM      Result Value Ref Range   Glucose-Capillary 165 (*) 70 - 99 mg/dL  GLUCOSE, CAPILLARY     Status: Abnormal   Collection Time    04/20/14  6:27 AM      Result Value Ref Range   Glucose-Capillary 130 (*) 70 - 99 mg/dL    Physical Findings: AIMS: Facial and Oral Movements Muscles of Facial Expression: None, normal Lips and Perioral Area: None, normal Jaw: None, normal Tongue: None, normal,Extremity Movements Upper (arms, wrists, hands, fingers): None, normal Lower (legs, knees, ankles, toes): None, normal,  Trunk Movements Neck, shoulders, hips: None, normal, Overall Severity Severity of abnormal movements (highest score from questions above): None, normal Incapacitation due to abnormal movements: None, normal Patient's awareness of abnormal movements (rate only patient's report): No Awareness, Dental Status Current problems with teeth and/or dentures?: No Does patient usually wear dentures?: No  CIWA:  CIWA-Ar Total: 2 COWS:  COWS Total Score: 2  Psychiatric Specialty Exam: See Psychiatric Specialty Exam and Suicide Risk Assessment completed by Attending Physician prior to discharge.  Discharge destination:  Home  Is patient on multiple antipsychotic therapies at discharge:  No   Has Patient had three or more failed trials of antipsychotic monotherapy by history:  No  Recommended Plan for Multiple Antipsychotic Therapies: NA  Discharge Instructions   Discharge instructions    Complete by:  As directed   Please follow up with your Primary Care Provider for further management of your medical problems such as Hypertension and Diabetes.            Medication List    STOP taking these medications       divalproex 500 MG DR tablet  Commonly known as:  DEPAKOTE     risperiDONE 0.5 MG tablet  Commonly known as:  RISPERDAL      TAKE these medications     Indication   amLODipine 5 MG tablet  Commonly known as:  NORVASC  Take 1 tablet (5 mg total) by mouth 2 (two) times daily.   Indication:  High Blood Pressure     aspirin EC 81 MG tablet  Take 1 tablet (81 mg total) by mouth daily.   Indication:  Heart Attack     benztropine 1 MG tablet  Commonly known as:  COGENTIN  Take 1 tablet (1  mg total) by mouth 2 (two) times daily.   Indication:  Extrapyramidal Reaction caused by Medications     cloNIDine 0.1 MG tablet  Commonly known as:  CATAPRES  Take 1 tablet (0.1 mg total) by mouth 2 (two) times daily.   Indication:  High Blood Pressure     furosemide 40 MG tablet   Commonly known as:  LASIX  Take 1 tablet (40 mg total) by mouth 2 (two) times daily.   Indication:  High Blood Pressure     gabapentin 400 MG capsule  Commonly known as:  NEURONTIN  Take 1 capsule (400 mg total) by mouth 3 (three) times daily.   Indication:  Agitation, Nerve Pain     glipiZIDE 5 MG tablet  Commonly known as:  GLUCOTROL  Take 1 tablet (5 mg total) by mouth daily before breakfast.   Indication:  Type 2 Diabetes     haloperidol 10 MG tablet  Commonly known as:  HALDOL  Take 1 tablet (10 mg total) by mouth every evening.   Indication:  Psychosis, Schizophrenia     haloperidol 5 MG tablet  Commonly known as:  HALDOL  Take 1 tablet (5 mg total) by mouth daily.   Indication:  Psychosis, Schizophrenia     hydrALAZINE 100 MG tablet  Commonly known as:  APRESOLINE  Take 1 tablet (100 mg total) by mouth 3 (three) times daily.   Indication:  High Blood Pressure     labetalol 200 MG tablet  Commonly known as:  NORMODYNE  Take 1 tablet (200 mg total) by mouth 2 (two) times daily.   Indication:  High Blood Pressure     lamoTRIgine 25 MG tablet  Commonly known as:  LAMICTAL  Take 2 tablets (50 mg total) by mouth every evening.   Indication:  Mood control     potassium chloride 10 MEQ tablet  Commonly known as:  K-DUR  Take 1 tablet (10 mEq total) by mouth 2 (two) times daily.   Indication:  Low Amount of Potassium in the Blood     traZODone 100 MG tablet  Commonly known as:  DESYREL  Take 1 tablet (100 mg total) by mouth at bedtime.   Indication:  Trouble Sleeping     Vitamin D (Ergocalciferol) 50000 UNITS Caps capsule  Commonly known as:  DRISDOL  Take 1 capsule (50,000 Units total) by mouth every 21 ( twenty-one) days.   Indication:  Vitamin D Deficiency           Follow-up Information   Follow up with [facility] On 04/21/2014. (8AM on Wednesday for your hospital follow up appointment)    Contact information:   [place] [phone]       Follow-up recommendations:   Activity: No restrictions,patient to follow up with aftercare as scheduled   Comments:   Take all your medications as prescribed by your mental healthcare provider.  Report any adverse effects and or reactions from your medicines to your outpatient provider promptly.  Patient is instructed and cautioned to not engage in alcohol and or illegal drug use while on prescription medicines.  In the event of worsening symptoms, patient is instructed to call the crisis hotline, 911 and or go to the nearest ED for appropriate evaluation and treatment of symptoms.  Follow-up with your primary care provider for your other medical issues, concerns and or health care needs.   Total Discharge Time:  Greater than 30 minutes.  Signed: Elmarie Shiley  NP-C 04/20/2014, 9:46 AM

## 2014-04-20 NOTE — Progress Notes (Signed)
[facility] Adult Case Management Discharge Plan :  Will you be returning to the same living situation after discharge: Yes,  home At discharge, do you have transportation home?:Yes,  sheriff Do you have the ability to pay for your medications:Yes,  MCR, mental health  Release of information consent forms completed and in the chart;  Patient's signature needed at discharge.  Patient to Follow up at: Follow-up Information   Follow up with [facility] On 04/21/2014. (8AM on Wednesday for your hospital follow up appointment)    Contact information:   [place] [phone]      Patient denies SI/HI:   Yes,  yes    Safety Planning and Suicide Prevention discussed:  Yes,  yes  [name] 04/20/2014, 9:56 AM

## 2014-04-20 NOTE — Progress Notes (Addendum)
D:  Patient's self inventory sheet, patient slept good last night, sleep medication was helpful.  Good appetite, normal energy level, good concentration.  Rated depression 9, hopeless and anxiety 8.  Withdrawals from drugs continue.  Denied SI.  No physical problems.  Pain continues, medication does help pain.  Goal is to discharge today.  Does have discharge plans.   A:  Medications administered per MD orders.  Emotional support and encouragement given patient. R:  Denied SI and HI.  Denied A/V hallucinations.  Safety maintained with 15 minute checks.   CBG at lunch 90, no insulin required per MD orders.

## 2014-04-20 NOTE — Progress Notes (Signed)
Discharge Note:  Patient denied SI and HI.  Denied A/V hallucinations.  Sheriff came to pick up patient to transport home.  Suicide prevention information given and discussed with patient who stated he understood and had no questions.  Patient received his belongings that were in locker 49 listed on search sheet.  Patient stated he appreciated all assistance received from [facility] staff.

## 2014-04-20 NOTE — Discharge Summary (Signed)
Patient was seen face to face for psychiatric evaluation, suicide risk assessment and case discussed with treatment team and NP and made appropriate disposition plans. Reviewed the information documented and agree with the treatment plan.   Lailana Shira ,MD Attending Psychiatrist  [facility]    

## 2014-04-23 NOTE — Progress Notes (Signed)
Patient Discharge Instructions:  No documentation was faxed to [facility] for [place].  The patient refused to sign the ROI.  Patsey Berthold, 04/23/2014, 11:43 AM

## 2014-05-18 ENCOUNTER — Encounter (HOSPITAL_COMMUNITY): Payer: Self-pay | Admitting: Emergency Medicine

## 2014-05-18 ENCOUNTER — Emergency Department (HOSPITAL_COMMUNITY)
Admission: EM | Admit: 2014-05-18 | Discharge: 2014-05-20 | Disposition: A | Discharge status: Other Acute Inpatient Hospital | Payer: Medicare Other | Attending: Emergency Medicine | Admitting: Emergency Medicine

## 2014-05-18 DIAGNOSIS — I10 Essential (primary) hypertension: Secondary | ICD-10-CM | POA: Insufficient documentation

## 2014-05-18 DIAGNOSIS — F209 Schizophrenia, unspecified: Secondary | ICD-10-CM

## 2014-05-18 DIAGNOSIS — F200 Paranoid schizophrenia: Secondary | ICD-10-CM | POA: Diagnosis not present

## 2014-05-18 DIAGNOSIS — Z008 Encounter for other general examination: Secondary | ICD-10-CM | POA: Diagnosis present

## 2014-05-18 DIAGNOSIS — E119 Type 2 diabetes mellitus without complications: Secondary | ICD-10-CM | POA: Insufficient documentation

## 2014-05-18 DIAGNOSIS — Z7982 Long term (current) use of aspirin: Secondary | ICD-10-CM | POA: Diagnosis not present

## 2014-05-18 DIAGNOSIS — F250 Schizoaffective disorder, bipolar type: Secondary | ICD-10-CM | POA: Insufficient documentation

## 2014-05-18 DIAGNOSIS — F319 Bipolar disorder, unspecified: Secondary | ICD-10-CM | POA: Diagnosis not present

## 2014-05-18 DIAGNOSIS — Z79899 Other long term (current) drug therapy: Secondary | ICD-10-CM | POA: Insufficient documentation

## 2014-05-18 DIAGNOSIS — F17200 Nicotine dependence, unspecified, uncomplicated: Secondary | ICD-10-CM | POA: Diagnosis not present

## 2014-05-18 HISTORY — DX: Paranoid schizophrenia: F20.0

## 2014-05-18 LAB — BASIC METABOLIC PANEL
Anion gap: 11 (ref 5–15)
BUN: 17 mg/dL (ref 6–23)
CHLORIDE: 102 meq/L (ref 96–112)
CO2: 29 mEq/L (ref 19–32)
CREATININE: 1.39 mg/dL — AB (ref 0.50–1.35)
Calcium: 9.4 mg/dL (ref 8.4–10.5)
GFR calc Af Amer: 66 mL/min — ABNORMAL LOW (ref >90)
GFR calc non Af Amer: 57 mL/min — ABNORMAL LOW (ref >90)
Glucose, Bld: 99 mg/dL (ref 70–99)
Potassium: 3.2 mEq/L — ABNORMAL LOW (ref 3.7–5.3)
Sodium: 142 mEq/L (ref 137–147)

## 2014-05-18 LAB — RAPID URINE DRUG SCREEN, HOSP PERFORMED
Amphetamines: NOT DETECTED
Barbiturates: NOT DETECTED
Benzodiazepines: NOT DETECTED
COCAINE: NOT DETECTED
Opiates: NOT DETECTED
TETRAHYDROCANNABINOL: NOT DETECTED

## 2014-05-18 LAB — CBC WITH DIFFERENTIAL/PLATELET
Basophils Absolute: 0.1 10*3/uL (ref 0.0–0.1)
Basophils Relative: 1 % (ref 0–1)
EOS ABS: 0.3 10*3/uL (ref 0.0–0.7)
Eosinophils Relative: 3 % (ref 0–5)
HCT: 35.7 % — ABNORMAL LOW (ref 39.0–52.0)
HEMOGLOBIN: 11.6 g/dL — AB (ref 13.0–17.0)
Lymphocytes Relative: 31 % (ref 12–46)
Lymphs Abs: 2.3 10*3/uL (ref 0.7–4.0)
MCH: 29.1 pg (ref 26.0–34.0)
MCHC: 32.5 g/dL (ref 30.0–36.0)
MCV: 89.7 fL (ref 78.0–100.0)
MONOS PCT: 12 % (ref 3–12)
Monocytes Absolute: 0.9 10*3/uL (ref 0.1–1.0)
NEUTROS ABS: 3.9 10*3/uL (ref 1.7–7.7)
Neutrophils Relative %: 53 % (ref 43–77)
Platelets: 377 10*3/uL (ref 150–400)
RBC: 3.98 MIL/uL — ABNORMAL LOW (ref 4.22–5.81)
RDW: 14.8 % (ref 11.5–15.5)
WBC: 7.3 10*3/uL (ref 4.0–10.5)

## 2014-05-18 LAB — ETHANOL: Alcohol, Ethyl (B): <11 mg/dL (ref 0–11)

## 2014-05-18 MED ORDER — HALOPERIDOL 5 MG PO TABS
10.0000 mg | ORAL_TABLET | Freq: Every evening | ORAL | Status: DC
  Administered 2014-05-19: 10 mg via ORAL
  Filled 2014-05-18: qty 2

## 2014-05-18 MED ORDER — VITAMIN D (ERGOCALCIFEROL) 1.25 MG (50000 UNIT) PO CAPS
50000.0000 [IU] | ORAL_CAPSULE | ORAL | Status: DC
  Administered 2014-05-19: 50000 [IU] via ORAL
  Filled 2014-05-18: qty 1

## 2014-05-18 MED ORDER — AMLODIPINE BESYLATE 5 MG PO TABS
5.0000 mg | ORAL_TABLET | Freq: Two times a day (BID) | ORAL | Status: DC
  Administered 2014-05-19 – 2014-05-20 (×3): 5 mg via ORAL
  Filled 2014-05-18 (×3): qty 1

## 2014-05-18 MED ORDER — CLONIDINE HCL 0.1 MG PO TABS
0.1000 mg | ORAL_TABLET | Freq: Two times a day (BID) | ORAL | Status: DC
  Administered 2014-05-19 – 2014-05-20 (×4): 0.1 mg via ORAL
  Filled 2014-05-18 (×4): qty 1

## 2014-05-18 MED ORDER — GLIPIZIDE 5 MG PO TABS
5.0000 mg | ORAL_TABLET | Freq: Every day | ORAL | Status: DC
  Administered 2014-05-19 – 2014-05-20 (×2): 5 mg via ORAL
  Filled 2014-05-18 (×3): qty 1

## 2014-05-18 MED ORDER — TRAZODONE HCL 50 MG PO TABS
100.0000 mg | ORAL_TABLET | Freq: Every day | ORAL | Status: DC
  Administered 2014-05-19: 100 mg via ORAL
  Filled 2014-05-18: qty 2

## 2014-05-18 MED ORDER — GABAPENTIN 400 MG PO CAPS
400.0000 mg | ORAL_CAPSULE | Freq: Three times a day (TID) | ORAL | Status: DC
  Administered 2014-05-19 – 2014-05-20 (×4): 400 mg via ORAL
  Filled 2014-05-18 (×4): qty 1

## 2014-05-18 MED ORDER — POTASSIUM CHLORIDE ER 10 MEQ PO TBCR
10.0000 meq | EXTENDED_RELEASE_TABLET | Freq: Two times a day (BID) | ORAL | Status: DC
  Administered 2014-05-19 – 2014-05-20 (×3): 10 meq via ORAL
  Filled 2014-05-18 (×6): qty 1

## 2014-05-18 MED ORDER — BENZTROPINE MESYLATE 1 MG PO TABS
1.0000 mg | ORAL_TABLET | Freq: Two times a day (BID) | ORAL | Status: DC
  Administered 2014-05-19 – 2014-05-20 (×3): 1 mg via ORAL
  Filled 2014-05-18 (×3): qty 1

## 2014-05-18 MED ORDER — HYDRALAZINE HCL 25 MG PO TABS
ORAL_TABLET | ORAL | Status: AC
  Filled 2014-05-18: qty 4

## 2014-05-18 MED ORDER — FUROSEMIDE 40 MG PO TABS
40.0000 mg | ORAL_TABLET | Freq: Two times a day (BID) | ORAL | Status: DC
  Administered 2014-05-19 – 2014-05-20 (×3): 40 mg via ORAL
  Filled 2014-05-18 (×3): qty 1

## 2014-05-18 MED ORDER — LAMOTRIGINE 25 MG PO TABS
50.0000 mg | ORAL_TABLET | Freq: Every evening | ORAL | Status: DC
  Administered 2014-05-19: 50 mg via ORAL
  Filled 2014-05-18 (×3): qty 2

## 2014-05-18 MED ORDER — LABETALOL HCL 200 MG PO TABS
ORAL_TABLET | ORAL | Status: AC
  Filled 2014-05-18: qty 1

## 2014-05-18 MED ORDER — ASPIRIN EC 81 MG PO TBEC
81.0000 mg | DELAYED_RELEASE_TABLET | Freq: Every day | ORAL | Status: DC
  Administered 2014-05-19 – 2014-05-20 (×2): 81 mg via ORAL
  Filled 2014-05-18 (×2): qty 1

## 2014-05-18 MED ORDER — LABETALOL HCL 200 MG PO TABS
200.0000 mg | ORAL_TABLET | Freq: Two times a day (BID) | ORAL | Status: DC
  Administered 2014-05-19 – 2014-05-20 (×3): 200 mg via ORAL
  Filled 2014-05-18 (×6): qty 1

## 2014-05-18 MED ORDER — LAMOTRIGINE 25 MG PO TABS
ORAL_TABLET | ORAL | Status: AC
  Filled 2014-05-18: qty 2

## 2014-05-18 MED ORDER — HYDRALAZINE HCL 25 MG PO TABS
100.0000 mg | ORAL_TABLET | Freq: Three times a day (TID) | ORAL | Status: DC
  Administered 2014-05-19 – 2014-05-20 (×4): 100 mg via ORAL
  Filled 2014-05-18 (×10): qty 4

## 2014-05-18 MED ORDER — HALOPERIDOL 5 MG PO TABS
5.0000 mg | ORAL_TABLET | Freq: Every day | ORAL | Status: DC
  Administered 2014-05-19 – 2014-05-20 (×2): 5 mg via ORAL
  Filled 2014-05-18 (×2): qty 1

## 2014-05-18 NOTE — ED Notes (Signed)
Pt walked to room 17 with [employer] deputies and [employer] security.

## 2014-05-18 NOTE — ED Notes (Signed)
Pt continues to ignore staff. Will not answer questions and refused medictions.

## 2014-05-18 NOTE — ED Notes (Signed)
Pt ambulating to BR with out difficulty. Refusing to answer questions at this time.

## 2014-05-18 NOTE — ED Notes (Signed)
PT was d/c from psych hospital on 04/21/14 and is in the process of getting an interim guardian appointed to him. PT is IVC for not taking his perscription medications and his sister IVC him for increased paranoid schizoprenia and bipolar. PT refused VS in triage and refused an arm bractlet.
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
## 2014-05-18 NOTE — ED Notes (Signed)
Pt refusing to leave waiting area behind triage. Pt refusing to answer questions and when he does speak it is a loud pressured voice. Pt stating he is leaving and going to [facility] to see his PCP. [facility] with pt.

## 2014-05-19 DIAGNOSIS — F331 Major depressive disorder, recurrent, moderate: Secondary | ICD-10-CM

## 2014-05-19 LAB — CBG MONITORING, ED: Glucose-Capillary: 133 mg/dL — ABNORMAL HIGH (ref 70–99)

## 2014-05-19 MED ORDER — ACETAMINOPHEN 500 MG PO TABS
1000.0000 mg | ORAL_TABLET | Freq: Once | ORAL | Status: AC
  Administered 2014-05-19: 1000 mg via ORAL
  Filled 2014-05-19: qty 2

## 2014-05-19 NOTE — ED Notes (Signed)
Patient's sister Ivan [name] called and stated she is his interim guardian at this time. She is requesting that we call her when we know if the patient will be discharged or sent to another facility. Number is [phone].

## 2014-05-19 NOTE — ED Notes (Signed)
Spoke with patient's sister Ivan [name] and advised her that patient is recommended for inpatient treatment. Patient verbalized understanding.

## 2014-05-19 NOTE — BH Assessment (Signed)
Nena Polio, PA evaluated patient today and inpatient was recommend. Writer referred patient to [facility].

## 2014-05-19 NOTE — BH Assessment (Signed)
Spoke with [facility] Ivan [name] regarding prior request for Tele-psych evaluation by Psychiatrist or extender. Per Ivan [name] is to be contacted to complete Tele-psych evaluation.  This Probation officer spoke with Ivan [name] at [place] outpatient location and was informed that [name] is not available at this time. Per Ivan [name] she will ensure to inform Ivan [name] that she will need to contact TTS regarding this request.   [name], MS, [place] Assessment Counselor

## 2014-05-19 NOTE — ED Provider Notes (Signed)
Seen and evaluated by TTS.  Plan for telemetry psychiatry consultation in the morning  Hoy Morn, MD 05/19/14 [phone]

## 2014-05-19 NOTE — ED Notes (Signed)
Attempted to give 1800 meds but pt was sleeping. [employer] to let me know when pt is awake.

## 2014-05-19 NOTE — ED Notes (Signed)
TTS being completed at this time. ?

## 2014-05-19 NOTE — ED Notes (Signed)
Patient is sleeping and will get vitals once he wakes up

## 2014-05-19 NOTE — ED Notes (Signed)
Patient continues to ignore staff members and continues to state that we need to look at the blood work that it will me everything I need to know.
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
## 2014-05-19 NOTE — ED Notes (Signed)
TTS completed; patient began yelling at evaluator.  Dr. Venora Maples, [employer] and [facility] security at room.  Patient instructed not to raise his voice and answer questions to help determine if he can go home or not.  Patient continued to be rude and raising his voice and then he began to talk more lucid and answer questions.
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
## 2014-05-20 ENCOUNTER — Inpatient Hospital Stay (HOSPITAL_COMMUNITY)
Admission: EM | Admit: 2014-05-20 | Discharge: 2014-05-25 | DRG: 885 | Disposition: A | Discharge status: Home / Self Care | Payer: 59 | Source: Intra-hospital | Attending: Psychiatry | Admitting: Psychiatry

## 2014-05-20 ENCOUNTER — Encounter (HOSPITAL_COMMUNITY): Payer: Self-pay | Admitting: *Deleted

## 2014-05-20 DIAGNOSIS — I129 Hypertensive chronic kidney disease with stage 1 through stage 4 chronic kidney disease, or unspecified chronic kidney disease: Secondary | ICD-10-CM | POA: Diagnosis present

## 2014-05-20 DIAGNOSIS — Z79899 Other long term (current) drug therapy: Secondary | ICD-10-CM | POA: Diagnosis not present

## 2014-05-20 DIAGNOSIS — E119 Type 2 diabetes mellitus without complications: Secondary | ICD-10-CM | POA: Diagnosis present

## 2014-05-20 DIAGNOSIS — F17210 Nicotine dependence, cigarettes, uncomplicated: Secondary | ICD-10-CM | POA: Diagnosis present

## 2014-05-20 DIAGNOSIS — G4700 Insomnia, unspecified: Secondary | ICD-10-CM | POA: Diagnosis present

## 2014-05-20 DIAGNOSIS — F203 Undifferentiated schizophrenia: Secondary | ICD-10-CM

## 2014-05-20 DIAGNOSIS — Z9114 Patient's other noncompliance with medication regimen: Secondary | ICD-10-CM | POA: Diagnosis present

## 2014-05-20 DIAGNOSIS — N183 Chronic kidney disease, stage 3 unspecified: Secondary | ICD-10-CM

## 2014-05-20 DIAGNOSIS — F200 Paranoid schizophrenia: Principal | ICD-10-CM | POA: Diagnosis present

## 2014-05-20 DIAGNOSIS — Z7982 Long term (current) use of aspirin: Secondary | ICD-10-CM

## 2014-05-20 DIAGNOSIS — G8929 Other chronic pain: Secondary | ICD-10-CM | POA: Diagnosis present

## 2014-05-20 DIAGNOSIS — Z559 Problems related to education and literacy, unspecified: Secondary | ICD-10-CM | POA: Diagnosis present

## 2014-05-20 DIAGNOSIS — Z046 Encounter for general psychiatric examination, requested by authority: Secondary | ICD-10-CM | POA: Diagnosis present

## 2014-05-20 DIAGNOSIS — F319 Bipolar disorder, unspecified: Secondary | ICD-10-CM | POA: Diagnosis present

## 2014-05-20 DIAGNOSIS — I10 Essential (primary) hypertension: Secondary | ICD-10-CM

## 2014-05-20 DIAGNOSIS — Z599 Problem related to housing and economic circumstances, unspecified: Secondary | ICD-10-CM

## 2014-05-20 DIAGNOSIS — R600 Localized edema: Secondary | ICD-10-CM

## 2014-05-20 DIAGNOSIS — F250 Schizoaffective disorder, bipolar type: Secondary | ICD-10-CM | POA: Diagnosis not present

## 2014-05-20 LAB — CBG MONITORING, ED
GLUCOSE-CAPILLARY: 111 mg/dL — AB (ref 70–99)
GLUCOSE-CAPILLARY: 147 mg/dL — AB (ref 70–99)

## 2014-05-20 MED ORDER — LABETALOL HCL 200 MG PO TABS
200.0000 mg | ORAL_TABLET | Freq: Two times a day (BID) | ORAL | Status: DC
  Administered 2014-05-20 – 2014-05-25 (×11): 200 mg via ORAL
  Filled 2014-05-20 (×3): qty 1
  Filled 2014-05-20: qty 2
  Filled 2014-05-20 (×11): qty 1

## 2014-05-20 MED ORDER — AMLODIPINE BESYLATE 5 MG PO TABS
5.0000 mg | ORAL_TABLET | Freq: Two times a day (BID) | ORAL | Status: DC
  Administered 2014-05-20 – 2014-05-25 (×10): 5 mg via ORAL
  Filled 2014-05-20 (×17): qty 1

## 2014-05-20 MED ORDER — NICOTINE POLACRILEX 2 MG MT GUM
2.0000 mg | CHEWING_GUM | OROMUCOSAL | Status: DC | PRN
  Administered 2014-05-21 – 2014-05-25 (×10): 2 mg via ORAL
  Filled 2014-05-20 (×2): qty 1

## 2014-05-20 MED ORDER — CLONIDINE HCL 0.1 MG PO TABS
0.1000 mg | ORAL_TABLET | Freq: Two times a day (BID) | ORAL | Status: DC
  Administered 2014-05-20 – 2014-05-25 (×11): 0.1 mg via ORAL
  Filled 2014-05-20 (×15): qty 1

## 2014-05-20 MED ORDER — ACETAMINOPHEN 325 MG PO TABS
650.0000 mg | ORAL_TABLET | Freq: Four times a day (QID) | ORAL | Status: DC | PRN
  Administered 2014-05-21 – 2014-05-23 (×3): 650 mg via ORAL
  Filled 2014-05-20 (×4): qty 2

## 2014-05-20 MED ORDER — LINAGLIPTIN 5 MG PO TABS
5.0000 mg | ORAL_TABLET | Freq: Every day | ORAL | Status: DC
  Administered 2014-05-21 – 2014-05-25 (×5): 5 mg via ORAL
  Filled 2014-05-20 (×7): qty 1

## 2014-05-20 MED ORDER — TRAZODONE HCL 100 MG PO TABS
100.0000 mg | ORAL_TABLET | Freq: Every day | ORAL | Status: DC
  Administered 2014-05-20 – 2014-05-24 (×5): 100 mg via ORAL
  Filled 2014-05-20: qty 1
  Filled 2014-05-20: qty 3
  Filled 2014-05-20 (×3): qty 1
  Filled 2014-05-20: qty 3
  Filled 2014-05-20 (×4): qty 1

## 2014-05-20 MED ORDER — VITAMIN D (ERGOCALCIFEROL) 1.25 MG (50000 UNIT) PO CAPS
50000.0000 [IU] | ORAL_CAPSULE | ORAL | Status: DC
  Administered 2014-05-21: 50000 [IU] via ORAL
  Filled 2014-05-20 (×2): qty 1

## 2014-05-20 MED ORDER — FUROSEMIDE 40 MG PO TABS
40.0000 mg | ORAL_TABLET | Freq: Two times a day (BID) | ORAL | Status: DC
  Administered 2014-05-21 – 2014-05-25 (×10): 40 mg via ORAL
  Filled 2014-05-20 (×13): qty 1

## 2014-05-20 MED ORDER — ALUM & MAG HYDROXIDE-SIMETH 200-200-20 MG/5ML PO SUSP: 30.0000 mL | ORAL | Status: DC | PRN

## 2014-05-20 MED ORDER — ASPIRIN EC 81 MG PO TBEC
81.0000 mg | DELAYED_RELEASE_TABLET | Freq: Every day | ORAL | Status: DC
  Administered 2014-05-21 – 2014-05-25 (×5): 81 mg via ORAL
  Filled 2014-05-20 (×7): qty 1

## 2014-05-20 MED ORDER — GABAPENTIN 400 MG PO CAPS
400.0000 mg | ORAL_CAPSULE | Freq: Three times a day (TID) | ORAL | Status: DC
  Administered 2014-05-21 – 2014-05-25 (×15): 400 mg via ORAL
  Filled 2014-05-20: qty 1
  Filled 2014-05-20: qty 9
  Filled 2014-05-20: qty 1
  Filled 2014-05-20: qty 9
  Filled 2014-05-20 (×4): qty 1
  Filled 2014-05-20: qty 9
  Filled 2014-05-20 (×8): qty 1
  Filled 2014-05-20: qty 9
  Filled 2014-05-20 (×2): qty 1
  Filled 2014-05-20 (×2): qty 9
  Filled 2014-05-20: qty 1

## 2014-05-20 MED ORDER — DIVALPROEX SODIUM 500 MG PO DR TAB
500.0000 mg | DELAYED_RELEASE_TABLET | Freq: Two times a day (BID) | ORAL | Status: DC
  Administered 2014-05-20 – 2014-05-24 (×8): 500 mg via ORAL
  Filled 2014-05-20 (×11): qty 1

## 2014-05-20 MED ORDER — MAGNESIUM HYDROXIDE 400 MG/5ML PO SUSP: 30.0000 mL | Freq: Every day | ORAL | Status: DC | PRN

## 2014-05-20 MED ORDER — POTASSIUM CHLORIDE ER 10 MEQ PO TBCR
10.0000 meq | EXTENDED_RELEASE_TABLET | Freq: Two times a day (BID) | ORAL | Status: DC
  Administered 2014-05-20 – 2014-05-25 (×11): 10 meq via ORAL
  Filled 2014-05-20 (×16): qty 1

## 2014-05-20 NOTE — ED Provider Notes (Signed)
Patient accepted to [facility] by Dr. Shea Evans.  BP 133/77  Pulse 68  Temp(Src) 97.4 F (36.3 C) (Oral)  Resp 18  SpO2 100%   Ezequiel Essex, MD 05/20/14 1609
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
## 2014-05-20 NOTE — BH Assessment (Addendum)
[facility] Assessment Progress Note  The following facilities have been contacted to refer pt with results as noted:  *[facility]: at 11:54 Kerry Dory reports that he is uncertain about their admitting status.  He will check and call me back.  Results pending at this time. *[facility]: at 13:09 Angela Nevin reports that they are at capacity. [facility]: at 13:10 call rolled to voice mail for Vernie Murders and a message was left.  Response pending at this time. [facility]: at 13:12 a message was left for Darlene.  At 13:23 she called back to inform me that they only have geriatric beds at this time. [facility]: several calls were placed starting at 13:15.  In all cases they rolled to voice mail and a message was left.  Response pending at this time. [facility]: at 13:18 Anderson Malta reports that they are unlikely to have beds, but welcomes referral.  Documentation was faxed. *[facility]: at 13:20 they report that they are at capacity. [facility]: several calls were placed starting at 13:15.  In all cases they rolled to voice mail and a message was left.  Response pending at this time.  Per Letitia Libra, RN, [facility], pt has been accepted to [facility] to the service of Dr Shea Evans, Rm [id number].  At 15:57 I spoke to pt's nurse, Cruise, to notify her.  She agrees to call report to [phone].  A call has been placed to [facility] to inform them to destroy referral for this pt.  Jalene Mullet, MA Triage Specialist 05/20/2014 @ 16:18

## 2014-05-21 DIAGNOSIS — F259 Schizoaffective disorder, unspecified: Secondary | ICD-10-CM

## 2014-05-21 LAB — GLUCOSE, CAPILLARY: Glucose-Capillary: 127 mg/dL — ABNORMAL HIGH (ref 70–99)

## 2014-05-21 MED ORDER — HALOPERIDOL 5 MG PO TABS
5.0000 mg | ORAL_TABLET | Freq: Every day | ORAL | Status: DC
  Administered 2014-05-21 – 2014-05-24 (×4): 5 mg via ORAL
  Filled 2014-05-21 (×6): qty 1
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
## 2014-05-21 NOTE — Progress Notes (Signed)
D. Pt has been up and has been visible in milieu, has been attending and participating in various milieu activities. Pt spoke about how he is feeling ok but there are some paranoid undertones in his conversation. Pt did receive medication without incident. A. Support and encouragement provided. R. Safety maintained, will continue to monitor.

## 2014-05-21 NOTE — BHH Suicide Risk Assessment (Signed)
[facility] INPATIENT:  Family/Significant Other Suicide Prevention Education  Suicide Prevention Education:  Patient Refusal for Family/Significant Other Suicide Prevention Education: The patient Ivan [name] has refused to provide written consent for family/significant other to be provided Family/Significant Other Suicide Prevention Education during admission and/or prior to discharge.  Physician notified.  SPE completed with pt. SPI pamphlet provided to pt and he was encouraged to share information with support network, ask questions, and talk about any concerns relating to SPE.  Smart, Vallory Oetken LCSWA  05/21/2014, 1:52 PM
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
## 2014-05-21 NOTE — [facility] Group Notes (Signed)
[place] LCSW Group Therapy  05/21/2014 10:05 AM  Type of Therapy:  Group Therapy  Participation Level:  Did Not Attend-pt in room sleeping. Did not come to group when asked.   Smart, Sohum Delillo LCSWA  05/21/2014, 10:05 AM

## 2014-05-21 NOTE — Progress Notes (Signed)
D: Patient is A&Ox4, pleasant, denies SI/HI, A/V hallucinations, states he is doing well other than being cold because of the weather. Patient slept through group this morning.   A: Patient took medication as scheduled. Support and encouragement given prn. R: Patient remains safe.  Emonee Winkowski, Thornton Dales

## 2014-05-21 NOTE — Progress Notes (Signed)
[place] Group Notes:  (Nursing/MHT/Case Management/Adjunct)  Date:  05/21/2014  Time:  9:57 PM  Type of Therapy:  Psychoeducational Skills  Participation Level:  Active  Participation Quality:  Appropriate  Affect:  Appropriate  Cognitive:  Lacking  Insight:  Lacking  Engagement in Group:  Limited  Modes of Intervention:  Education  Summary of Progress/Problems: The patient expressed in group that he had a "fairly decent" day but did not offer additional details. As a theme for the day, his relapse prevention will include saving his money to buy what he is interested in and pushing himself to work harder in life.   Archie Balboa S 05/21/2014, 9:57 PM

## 2014-05-21 NOTE — Plan of Care (Signed)
Problem: Diagnosis: Increased Risk For Suicide Attempt Goal: LTG-Patient Will Show Positive Response to Medication LTG (by discharge) : Patient will show positive response to medication and will participate in the development of the discharge plan. As delusional symptoms about family begins to subside and client can verbalize at least two positive ways to avoid negative behaviors i.e. Walk away from stimulus and take medications everyday as prescribed. Outcome: Not Met (add Reason) Client is not ready fully address family issues or any positive things about family, medications just begun.
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
## 2014-05-21 NOTE — Tx Team (Signed)
Initial Interdisciplinary Treatment Plan   PATIENT STRESSORS: Health problems Legal issue   PROBLEM LIST: Problem List/Patient Goals Date to be addressed Date deferred Reason deferred Estimated date of resolution  Depression 05-20-14     "keep wicked sister away from me" 05-20-14     Noncompliant with medications 05-20-14                                          DISCHARGE CRITERIA:  Ability to meet basic life and health needs Improved stabilization in mood, thinking, and/or behavior Medical problems require only outpatient monitoring Reduction of life-threatening or endangering symptoms to within safe limits Verbal commitment to aftercare and medication compliance  PRELIMINARY DISCHARGE PLAN: Attend aftercare/continuing care group Outpatient therapy Return to previous living arrangement  PATIENT/FAMIILY INVOLVEMENT: This treatment plan has been presented to and reviewed with the patient, Ivan [name], and/or family member.  The patient and family have been given the opportunity to ask questions and make suggestions.  Zoe Lan 05/21/2014, 1:11 AM

## 2014-05-21 NOTE — Plan of Care (Signed)
Problem: Ineffective individual coping Goal: LTG: Patient will report a decrease in negative feelings Outcome: Not Met (add Reason) Client not ready to deal with reality that some of the issues expressed by family maybe occuring due to his noncompliance of medication.

## 2014-05-22 DIAGNOSIS — F200 Paranoid schizophrenia: Principal | ICD-10-CM

## 2014-05-22 LAB — BASIC METABOLIC PANEL
Anion gap: 12 (ref 5–15)
BUN: 22 mg/dL (ref 6–23)
CALCIUM: 9 mg/dL (ref 8.4–10.5)
CO2: 27 mEq/L (ref 19–32)
CREATININE: 1.58 mg/dL — AB (ref 0.50–1.35)
Chloride: 101 mEq/L (ref 96–112)
GFR calc Af Amer: 57 mL/min — ABNORMAL LOW (ref >90)
GFR, EST NON AFRICAN AMERICAN: 49 mL/min — AB (ref >90)
GLUCOSE: 132 mg/dL — AB (ref 70–99)
Potassium: 3.5 mEq/L — ABNORMAL LOW (ref 3.7–5.3)
Sodium: 140 mEq/L (ref 137–147)

## 2014-05-22 LAB — GLUCOSE, CAPILLARY: GLUCOSE-CAPILLARY: 119 mg/dL — AB (ref 70–99)
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
## 2014-05-22 NOTE — Progress Notes (Signed)
Patient ID: Ivan [name], male   DOB: 10/06/1960, 53 y.o.   MRN: [id number]  No s/s of distress noted at this time.

## 2014-05-22 NOTE — Progress Notes (Signed)
[place] Group Notes:  (Nursing/MHT/Case Management/Adjunct)  Date:  05/22/2014  Time:  9:04 PM  Type of Therapy:  Psychoeducational Skills  Participation Level:  Minimal  Participation Quality:  Attentive  Affect:  Flat  Cognitive:  Lacking  Insight:  Limited  Engagement in Group:  Limited  Modes of Intervention:  Education  Summary of Progress/Problems: The patient little to share in group except that he was grateful to be alive and that the staff assisted him today. He would not go into greater detail. In terms of the theme for the day, his support system will be comprised of his "ex-pastor". He states that their are some outstanding issues with his family and that he does not anticipate that they will be supportive of him.   Darya Bigler S 05/22/2014, 9:04 PM

## 2014-05-22 NOTE — Progress Notes (Signed)
Patient ID: Ivan [name], male   DOB: [birth year], 53 y.o.   MRN: [id number] Psychoeducational Group Note  Date:  05/22/2014 Time:1000am  Group Topic/Focus:  Identifying Needs:   The focus of this group is to help patients identify their personal needs that have been historically problematic and identify healthy behaviors to address their needs.  Participation Level:  Active  Participation Quality:  Appropriate  Affect:  Appropriate  Cognitive:  Appropriate  Insight:  Supportive  Engagement in Group:  Supportive  Additional Comments:  Inventory group   Pricilla Larsson 05/22/2014,1:06 PM

## 2014-05-22 NOTE — Progress Notes (Signed)
Patient ID: Ivan [name], male   DOB: 01-06-1961, 53 y.o.   MRN: [id number] Psychoeducational Group Note  Date:  05/22/2014 Time:1030am  Group Topic/Focus:  Identifying Needs:   The focus of this group is to help patients identify their personal needs that have been historically problematic and identify healthy behaviors to address their needs.  Participation Level:  Active  Participation Quality:  Appropriate  Affect:  Appropriate  Cognitive:  Appropriate  Insight:  Supportive  Engagement in Group:  Supportive  Additional Comments:  Healthy coping skills.   Pricilla Larsson 05/22/2014,1:07 PM

## 2014-05-22 NOTE — Progress Notes (Signed)
[facility] MD Progress Note  05/22/2014 3:17 PM Ivan [name]  MRN:  [id number] Subjective: Patient reports: "I am angry because my father and sister made me come here.''  Objective: Patient seen and chart reviewed. He continues to verbalize his anger against his family whom he claims forced him to be admitted in the hospital. However, patient is less moody, irritable, agitated, bizarre on the unit today but remains paranoid. Patient reports that he was recently discharged from [facility] but was not compliant with his medications. Patient is now compliant with his medications and has not verbalized any adverse reactions. Diagnosis:   DSM5: Schizophrenia Disorders:  Delusional Disorder (297.1) and Schizophrenia (295.7) Obsessive-Compulsive Disorders:   Trauma-Stressor Disorders:   Substance/Addictive Disorders:   Depressive Disorders:  Disruptive Mood Dysregulation Disorder (296.99) Total Time spent with patient: 30 minutes  Axis I: Chronic paranoid schizophrenia  Axis II: Deferred Axis [name]:  Past Medical History  Diagnosis Date  . Diabetes mellitus   . Hypertension   . Bipolar 1 disorder   . Schizoaffective disorder   . Hypokalemia 04/02/2013  . Edema of both legs 04/07/2013  . Paranoid schizophrenia    Axis IV: other psychosocial or environmental problems and problems related to social environment  ADL's:  Intact  Sleep: Fair  Appetite:  Fair  Suicidal Ideation:  denies Homicidal Ideation:  denies AEB (as evidenced by):  Psychiatric Specialty Exam: Physical Exam  ROS  Blood pressure 106/66, pulse 61, temperature 97.7 F (36.5 C), temperature source Oral, resp. rate 16, height 6' (1.829 m), weight 126.1 kg (278 lb).Body mass index is 37.7 kg/(m^2).  General Appearance: Disheveled  Eye Contact::  Minimal  Speech:  Slow  Volume:  Decreased  Mood:  Dysphoric  Affect:  Constricted  Thought Process:  Circumstantial and Disorganized  Orientation:  Full (Time, Place, and  Person)  Thought Content:  Delusions and Paranoid Ideation  Suicidal Thoughts:  No  Homicidal Thoughts:  No  Memory:  Immediate;   Fair Recent;   Fair Remote;   Fair  Judgement:  Impaired  Insight:  Lacking  Psychomotor Activity:  Decreased  Concentration:  Fair  Recall:  [employer] of Knowledge:Fair  Language: Good  Akathisia:  No  Handed:  Right  AIMS (if indicated):     Assets:  Communication Skills Desire for Improvement Physical Health  Sleep:  Number of Hours: 2.5   Musculoskeletal: Strength & Muscle Tone: within normal limits Gait & Station: normal Patient leans: Backward  Current Medications: Current Facility-Administered Medications  Medication Dose Route Frequency Provider Last Rate Last Dose  . acetaminophen (TYLENOL) tablet 650 mg  650 mg Oral Q6H PRN Malena Peer, NP   650 mg at 05/21/14 1704  . alum & mag hydroxide-simeth (MAALOX/MYLANTA) 200-200-20 MG/5ML suspension 30 mL  30 mL Oral Q4H PRN Evanna Glenda Chroman, NP      . amLODipine (NORVASC) tablet 5 mg  5 mg Oral BID Malena Peer, NP   5 mg at 05/22/14 0836  . aspirin EC tablet 81 mg  81 mg Oral Daily Evanna Cori Greig Castilla, NP   81 mg at 05/22/14 0836  . cloNIDine (CATAPRES) tablet 0.1 mg  0.1 mg Oral BID Malena Peer, NP   0.1 mg at 05/22/14 0836  . divalproex (DEPAKOTE) DR tablet 500 mg  500 mg Oral BID Malena Peer, NP   500 mg at 05/22/14 0836  . furosemide (LASIX) tablet 40 mg  40 mg Oral BID Evanna Cori  Burkett, NP   40 mg at 05/22/14 0836  . gabapentin (NEURONTIN) capsule 400 mg  400 mg Oral TID Malena Peer, NP   400 mg at 05/22/14 1251  . haloperidol (HALDOL) tablet 5 mg  5 mg Oral Daily Neita Garnet, MD   5 mg at 05/22/14 0836  . labetalol (NORMODYNE) tablet 200 mg  200 mg Oral BID Malena Peer, NP   200 mg at 05/22/14 0836  . linagliptin (TRADJENTA) tablet 5 mg  5 mg Oral Daily Evanna Glenda Chroman, NP   5 mg at 05/22/14 0836  . magnesium hydroxide (MILK OF  MAGNESIA) suspension 30 mL  30 mL Oral Daily PRN Evanna Glenda Chroman, NP      . nicotine polacrilex (NICORETTE) gum 2 mg  2 mg Oral PRN Ursula Alert, MD   2 mg at 05/22/14 1251  . potassium chloride (K-DUR) CR tablet 10 mEq  10 mEq Oral BID Malena Peer, NP   10 mEq at 05/22/14 0836  . traZODone (DESYREL) tablet 100 mg  100 mg Oral QHS Malena Peer, NP   100 mg at 05/21/14 2044  . Vitamin D (Ergocalciferol) (DRISDOL) capsule 50,000 Units  50,000 Units Oral Q21 days Malena Peer, NP   50,000 Units at 05/21/14 1201    Lab Results:  Results for orders placed during the hospital encounter of 05/20/14 (from the past 48 hour(s))  GLUCOSE, [facility]ILL[facility]     Status: Abnormal   Collection Time    05/21/14  6:09 AM      Result Value Ref Range   Glucose-Capillary 127 (*) 70 - 99 mg/dL  BASIC METABOLIC PANEL     Status: Abnormal   Collection Time    05/22/14  6:30 AM      Result Value Ref Range   Sodium 140  137 - 147 mEq/L   Potassium 3.5 (*) 3.7 - 5.3 mEq/L   Chloride 101  96 - 112 mEq/L   CO2 27  19 - 32 mEq/L   Glucose, Bld 132 (*) 70 - 99 mg/dL   BUN 22  6 - 23 mg/dL   Creatinine, Ser 1.58 (*) 0.50 - 1.35 mg/dL   Calcium 9.0  8.4 - 10.5 mg/dL   GFR calc non Af Amer 49 (*) >90 mL/min   GFR calc Af Amer 57 (*) >90 mL/min   Comment: (NOTE)     The eGFR has been calculated using the CKD EPI equation.     This calculation has not been validated in all clinical situations.     eGFR's persistently <90 mL/min signify possible Chronic Kidney     Disease.   Anion gap 12  5 - 15   Comment: Performed at [place], [facility]     Status: Abnormal   Collection Time    05/22/14  6:41 AM      Result Value Ref Range   Glucose-Capillary 119 (*) 70 - 99 mg/dL    Physical Findings: AIMS: Facial and Oral Movements Muscles of Facial Expression: None, normal Lips and Perioral Area: None, normal Jaw: None, normal Tongue: None, normal,Extremity  Movements Upper (arms, wrists, hands, fingers): None, normal Lower (legs, knees, ankles, toes): None, normal, Trunk Movements Neck, shoulders, hips: None, normal, Overall Severity Severity of abnormal movements (highest score from questions above): None, normal Incapacitation due to abnormal movements: None, normal Patient's awareness of abnormal movements (rate only patient's report): No Awareness, Dental Status Current problems with teeth and/or  dentures?: Yes (missing lower teeth) Does patient usually wear dentures?: No  CIWA:  CIWA-Ar Total: 1 COWS:     Treatment Plan Summary: Daily contact with patient to assess and evaluate symptoms and progress in treatment Medication management  Plan:1. Admit for crisis management and stabilization. 2. Medication management to reduce current symptoms to base line and improve the  patient's overall level of functioning: Continue Depakote 556m bid for mood stabilization Continue Haldol 556mpo daily Continue Trazodone 10071mhs for insomnia 3. Treat health problems as indicated. 4. Develop treatment plan to decrease risk of relapse upon discharge and the need for  readmission. 5. Psycho-social education regarding relapse prevention and self care. 6. Health care follow up as needed for medical problems. 7. Restart home medications where appropriate.   Medical Decision Making Problem Points:  Established problem, stable/improving (1), Review of last therapy session (1) and Review of psycho-social stressors (1) Data Points:  Order Aims Assessment (2) Review of medication regiment & side effects (2) Review of new medications or change in dosage (2)  I certify that inpatient services furnished can reasonably be expected to improve the patient's condition.   Fiona Coto,MD 05/22/2014, 3:17 PM

## 2014-05-23 LAB — GLUCOSE, CAPILLARY: Glucose-Capillary: 119 mg/dL — ABNORMAL HIGH (ref 70–99)

## 2014-05-23 NOTE — Progress Notes (Signed)
Ivan [name] is doing well. HE attends his groups, he pays attention to the discussions and he says he understands them, as well. HE completes his  Morning assessment and on it he writes he  Rates his depression, anxiety,  And hopelessness  61/2, / 5 / 6.    A  He says he wants to find a new church and new support systems.   R Safe[facility] is in place and poc moved forward.

## 2014-05-23 NOTE — Progress Notes (Signed)
Patient ID: Ivan [name], male   DOB: 1960/12/05, 53 y.o.   MRN: [id number] Psychoeducational Group Note  Date:  05/23/2014 Time:  1030am  Group Topic/Focus:  Making Healthy Choices:   The focus of this group is to help patients identify negative/unhealthy choices they were using prior to admission and identify positive/healthier coping strategies to replace them upon discharge.  Participation Level:  Active  Participation Quality:  Appropriate  Affect:  Appropriate  Cognitive:  Appropriate  Insight:  Supportive  Engagement in Group:  Supportive  Additional Comments:  Healthy support systems  Pricilla Larsson 05/23/2014,3:44 PM

## 2014-05-23 NOTE — Progress Notes (Signed)
Patient ID: Ivan [name], male   DOB: 04/26/1961, 53 y.o.   MRN: [id number] Psychoeducational Group Note  Date:  05/23/2014 Time:  1015am  Group Topic/Focus:  Making Healthy Choices:   The focus of this group is to help patients identify negative/unhealthy choices they were using prior to admission and identify positive/healthier coping strategies to replace them upon discharge.  Participation Level:  Active  Participation Quality:  Appropriate  Affect:  Appropriate  Cognitive:  Appropriate  Insight:  Supportive  Engagement in Group:  Supportive  Additional Comments:  Inventory group   Pricilla Larsson 05/23/2014,3:43 PM

## 2014-05-23 NOTE — Progress Notes (Signed)
Adult Psychoeducational Group Note  Date:  05/23/2014 Time:  9:24 PM  Group Topic/Focus:    Participation Level:  Active  Participation Quality:  Appropriate  Affect:  Appropriate  Cognitive:  Appropriate  Insight: Appropriate  Engagement in Group:  Engaged  Modes of Intervention:  Discussion  Additional Comments: The patient expressed that his day was ok.The patient also said that he was glad to be getting help.  Ivan [name] 05/23/2014, 9:24 PM

## 2014-05-23 NOTE — [facility] Group Notes (Signed)
[place] Group Notes:  (Clinical Social Work)  05/23/2014   11:15am-12:00pm  Summary of Progress/Problems:  The main focus of today's process group was to listen to a variety of genres of music and to identify that different types of music provoke different responses.  The patient then was able to identify personally what was soothing for them, as well as energizing.  Handouts were used to record feelings evoked, as well as how patient can personally use this knowledge in sleep habits, with depression, and with other symptoms.  The patient expressed understanding of concepts, as well as knowledge of how each type of music affected him/her and how this can be used at home as a wellness/recovery tool.  He participated little in group, had his eyes closed much of the time.  He left the room for a few minutes in the middle of group, and when he returned remained standing.  He had to think awhile each time he was asked how different music made him feel, prior to being able to respond.  Type of Therapy:  Music Therapy   Participation Level:  Active  Participation Quality:  Inattentive  Affect:  Blunted  Cognitive:  Oriented  Insight:  Limited  Engagement in Therapy:  Limited  Modes of Intervention:   Activity, Exploration  Selmer Dominion, LCSW 05/23/2014, 12:30pm

## 2014-05-23 NOTE — Progress Notes (Signed)
D: Pt in a pleasant mood. States that he had a good day. He is looking forward to going home and finding a new church to attend that will add to his support system.  A: Support given. Verbalization encouraged. Medications given as prescribed. Encouraged pt to come to staff with any concerns.  R: Pt is receptive. No complaints of pain or discomfort at this time. Q15 min safety checks maintained. Pt remains safe on the unit. Will continue to monitor pt.

## 2014-05-23 NOTE — Progress Notes (Signed)
Patient ID: Ivan [name], male   DOB: 13-Jul-1961, 53 y.o.   MRN: [id number] [facility] MD Progress Note  05/23/2014 2:28 PM Ivan [name]  MRN:  [id number] Subjective: Patient reports: "I am all right today, hope to go home soon.''  Objective: Patient seen and chart reviewed. He endorses decreased mood swings,  Irritability and agitation. He appears less bizarre, disorganized but remains paranoid.  Patient is  compliant with his medications and has not verbalized any adverse reactions. Diagnosis:   DSM5: Schizophrenia Disorders:  Delusional Disorder (297.1) and Schizophrenia (295.7) Obsessive-Compulsive Disorders:   Trauma-Stressor Disorders:   Substance/Addictive Disorders:   Depressive Disorders:  Disruptive Mood Dysregulation Disorder (296.99) Total Time spent with patient: 30 minutes  Axis I: Chronic paranoid schizophrenia  Axis II: Deferred Axis [name]:  Past Medical History  Diagnosis Date  . Diabetes mellitus   . Hypertension   . Bipolar 1 disorder   . Schizoaffective disorder   . Hypokalemia 04/02/2013  . Edema of both legs 04/07/2013  . Paranoid schizophrenia    Axis IV: other psychosocial or environmental problems and problems related to social environment  ADL's:  Intact  Sleep: Fair  Appetite:  Fair  Suicidal Ideation:  denies Homicidal Ideation:  denies AEB (as evidenced by):  Psychiatric Specialty Exam: Physical Exam  ROS  Blood pressure 126/67, pulse 73, temperature 98.6 F (37 C), temperature source Oral, resp. rate 16, height 6' (1.829 m), weight 126.1 kg (278 lb).Body mass index is 37.7 kg/(m^2).  General Appearance: Disheveled  Eye Contact::  Minimal  Speech:  Slow  Volume:  Decreased  Mood:  Dysphoric  Affect:  Constricted  Thought Process:  Circumstantial and Disorganized  Orientation:  Full (Time, Place, and Person)  Thought Content:  Delusions and Paranoid Ideation  Suicidal Thoughts:  No  Homicidal Thoughts:  No  Memory:  Immediate;    Fair Recent;   Fair Remote;   Fair  Judgement:  Impaired  Insight:  Lacking  Psychomotor Activity:  Decreased  Concentration:  Fair  Recall:  [employer] of Knowledge:Fair  Language: Good  Akathisia:  No  Handed:  Right  AIMS (if indicated):     Assets:  Communication Skills Desire for Improvement Physical Health  Sleep:  Number of Hours: 4.25   Musculoskeletal: Strength & Muscle Tone: within normal limits Gait & Station: normal Patient leans: Backward  Current Medications: Current Facility-Administered Medications  Medication Dose Route Frequency Provider Last Rate Last Dose  . acetaminophen (TYLENOL) tablet 650 mg  650 mg Oral Q6H PRN Malena Peer, NP   650 mg at 05/22/14 2042  . alum & mag hydroxide-simeth (MAALOX/MYLANTA) 200-200-20 MG/5ML suspension 30 mL  30 mL Oral Q4H PRN Evanna Glenda Chroman, NP      . amLODipine (NORVASC) tablet 5 mg  5 mg Oral BID Malena Peer, NP   5 mg at 05/23/14 0814  . aspirin EC tablet 81 mg  81 mg Oral Daily Evanna Cori Greig Castilla, NP   81 mg at 05/23/14 0737  . cloNIDine (CATAPRES) tablet 0.1 mg  0.1 mg Oral BID Malena Peer, NP   0.1 mg at 05/23/14 0813  . divalproex (DEPAKOTE) DR tablet 500 mg  500 mg Oral BID Malena Peer, NP   500 mg at 05/23/14 0813  . furosemide (LASIX) tablet 40 mg  40 mg Oral BID Malena Peer, NP   40 mg at 05/23/14 0813  . gabapentin (NEURONTIN) capsule 400 mg  400 mg Oral  TID Malena Peer, NP   400 mg at 05/23/14 1334  . haloperidol (HALDOL) tablet 5 mg  5 mg Oral Daily Neita Garnet, MD   5 mg at 05/23/14 0813  . labetalol (NORMODYNE) tablet 200 mg  200 mg Oral BID Malena Peer, NP   200 mg at 05/23/14 0814  . linagliptin (TRADJENTA) tablet 5 mg  5 mg Oral Daily Evanna Glenda Chroman, NP   5 mg at 05/23/14 0814  . magnesium hydroxide (MILK OF MAGNESIA) suspension 30 mL  30 mL Oral Daily PRN Evanna Glenda Chroman, NP      . nicotine polacrilex (NICORETTE) gum 2 mg  2 mg Oral  PRN Ursula Alert, MD   2 mg at 05/22/14 2001  . potassium chloride (K-DUR) CR tablet 10 mEq  10 mEq Oral BID Malena Peer, NP   10 mEq at 05/23/14 0814  . traZODone (DESYREL) tablet 100 mg  100 mg Oral QHS Malena Peer, NP   100 mg at 05/22/14 2119  . Vitamin D (Ergocalciferol) (DRISDOL) capsule 50,000 Units  50,000 Units Oral Q21 days Malena Peer, NP   50,000 Units at 05/21/14 1201    Lab Results:  Results for orders placed during the hospital encounter of 05/20/14 (from the past 48 hour(s))  BASIC METABOLIC PANEL     Status: Abnormal   Collection Time    05/22/14  6:30 AM      Result Value Ref Range   Sodium 140  137 - 147 mEq/L   Potassium 3.5 (*) 3.7 - 5.3 mEq/L   Chloride 101  96 - 112 mEq/L   CO2 27  19 - 32 mEq/L   Glucose, Bld 132 (*) 70 - 99 mg/dL   BUN 22  6 - 23 mg/dL   Creatinine, Ser 1.58 (*) 0.50 - 1.35 mg/dL   Calcium 9.0  8.4 - 10.5 mg/dL   GFR calc non Af Amer 49 (*) >90 mL/min   GFR calc Af Amer 57 (*) >90 mL/min   Comment: (NOTE)     The eGFR has been calculated using the CKD EPI equation.     This calculation has not been validated in all clinical situations.     eGFR's persistently <90 mL/min signify possible Chronic Kidney     Disease.   Anion gap 12  5 - 15   Comment: Performed at [place], [place]     Status: Abnormal   Collection Time    05/22/14  6:41 AM      Result Value Ref Range   Glucose-Capillary 119 (*) 70 - 99 mg/dL  GLUCOSE, CAPILLARY     Status: Abnormal   Collection Time    05/23/14  5:16 AM      Result Value Ref Range   Glucose-Capillary 119 (*) 70 - 99 mg/dL    Physical Findings: AIMS: Facial and Oral Movements Muscles of Facial Expression: None, normal Lips and Perioral Area: None, normal Jaw: None, normal Tongue: None, normal,Extremity Movements Upper (arms, wrists, hands, fingers): None, normal Lower (legs, knees, ankles, toes): None, normal, Trunk Movements Neck,  shoulders, hips: None, normal, Overall Severity Severity of abnormal movements (highest score from questions above): None, normal Incapacitation due to abnormal movements: None, normal Patient's awareness of abnormal movements (rate only patient's report): No Awareness, Dental Status Current problems with teeth and/or dentures?: Yes (missing lower teeth) Does patient usually wear dentures?: No  CIWA:  CIWA-Ar Total: 1 COWS:  Treatment Plan Summary: Daily contact with patient to assess and evaluate symptoms and progress in treatment Medication management  Plan:1. Admit for crisis management and stabilization. 2. Medication management to reduce current symptoms to base line and improve the  patient's overall level of functioning: Continue Depakote 525m bid for mood stabilization Continue Haldol 56mpo daily Continue Trazodone 10031mhs for insomnia 3. Treat health problems as indicated. 4. Develop treatment plan to decrease risk of relapse upon discharge and the need for  readmission. 5. Psycho-social education regarding relapse prevention and self care. 6. Health care follow up as needed for medical problems.  Medical Decision Making Problem Points:  Established problem, stable/improving (1), Review of last therapy session (1) and Review of psycho-social stressors (1) Data Points:  Order Aims Assessment (2) Review of medication regiment & side effects (2) Review of new medications or change in dosage (2)  I certify that inpatient services furnished can reasonably be expected to improve the patient's condition.   Alean Kromer,MD 05/23/2014, 2:28 PM

## 2014-05-24 DIAGNOSIS — Z9114 Patient's other noncompliance with medication regimen: Secondary | ICD-10-CM

## 2014-05-24 LAB — GLUCOSE, CAPILLARY: Glucose-Capillary: 138 mg/dL — ABNORMAL HIGH (ref 70–99)

## 2014-05-24 MED ORDER — HALOPERIDOL 5 MG PO TABS
10.0000 mg | ORAL_TABLET | Freq: Every day | ORAL | Status: DC
  Administered 2014-05-25: 10 mg via ORAL
  Filled 2014-05-24 (×2): qty 2
  Filled 2014-05-24 (×2): qty 6

## 2014-05-24 MED ORDER — BENZTROPINE MESYLATE 0.5 MG PO TABS
0.5000 mg | ORAL_TABLET | Freq: Every day | ORAL | Status: DC
  Administered 2014-05-24 – 2014-05-25 (×2): 0.5 mg via ORAL
  Filled 2014-05-24 (×5): qty 1
  Filled 2014-05-24 (×2): qty 3

## 2014-05-24 MED ORDER — LAMOTRIGINE 100 MG PO TABS
100.0000 mg | ORAL_TABLET | Freq: Every day | ORAL | Status: DC
  Administered 2014-05-24 – 2014-05-25 (×2): 100 mg via ORAL
  Filled 2014-05-24 (×2): qty 1
  Filled 2014-05-24: qty 3
  Filled 2014-05-24: qty 1
  Filled 2014-05-24: qty 3
  Filled 2014-05-24 (×2): qty 1

## 2014-05-24 NOTE — Plan of Care (Signed)
Problem: Diagnosis: Increased Risk For Suicide Attempt Goal: STG-Patient Will Comply With Medication Regime Outcome: Completed/Met Date Met:  05/24/14 Pt is compliant with medication regime

## 2014-05-24 NOTE — Progress Notes (Signed)
D: Pt presents with flat affect and depressed mood. Pt has minimal interaction on the milieu. Pt pleasant on approach and cooperative with treatment. Pt denies SI/HI/AVH. Pt appears to be minimizing his symptoms.  A: Medications administered as ordered per MD. Verbal support given. Pt encouraged to express his emotions. Pt encouraged to attend groups. 15 minute checks performed for safety.  R: Pt safety maintained at this.

## 2014-05-24 NOTE — Progress Notes (Signed)
D: Patient in the hallway on approach.  Patient states he had a good day.  Patient states he tried to attend group.  Patient denies SI/HI and denies AVH. A: Staff to monitor Q 15 mins for safety.  Encouragement and support offered.  Scheduled medications administered per orders.  EKG completed tonight. R: Patient remains safe on the unit.  Patient attended group tonight.  Patient visible on the unit.  Patient taking administered medications.

## 2014-05-24 NOTE — [facility] Group Notes (Signed)
[facility] LCSW Aftercare Discharge Planning Group Note   05/24/2014 4:38 PM  Participation Quality:  Did not attend    [place]

## 2014-05-24 NOTE — Progress Notes (Signed)
Patient ID: Ivan [name], male   DOB: [birth year], 53 y.o.   MRN: [id number] [facility] MD Progress Note  05/24/2014 1:06 PM Ivan [name]  MRN:  [id number] Subjective: Patient reports: "I am all right today, and wants to leave, I am here for family issues".  Objective: Patient seen and chart reviewed. Patient appears to be irritable . He is withdrawn and reports that he wants to be discharged since he has been in and out of hospital for so long due to family issues. Patient was brought in for paranoia as well as irritability. Patient has a hx of being noncompliant on medications. Patient was recently in jail for a past warrant and has a court date on oct 12 th.  Patient denies any side effects of medications and has been compliant on it. Patient lives in a family run [facility].   Diagnosis:   DSM5: Primary psychiatric diagnosis:  Schizophrenia,multiple episodes,currently in acute episode   Secondary psychiatric diagnosis:  Non compliance with medical treatment / Medications.   Non psychiatric diagnosis:  Diabetes Mellitus  Hypertension  Chronic back pain  Pedal edema          Total Time spent with patient: 30 minutes   Past Medical History  Diagnosis Date  . Diabetes mellitus   . Hypertension   . Bipolar 1 disorder   . Schizoaffective disorder   . Hypokalemia 04/02/2013  . Edema of both legs 04/07/2013  . Paranoid schizophrenia     ADL's:  Intact  Sleep: Fair  Appetite:  Fair  :  Psychiatric Specialty Exam: Physical Exam  ROS  Blood pressure 110/70, pulse 74, temperature 98.7 F (37.1 C), temperature source Oral, resp. rate 16, height 6' (1.829 m), weight 126.1 kg (278 lb).Body mass index is 37.7 kg/(m^2).  General Appearance: Disheveled  Eye Contact::  Minimal  Speech:  Slow  Volume:  Decreased  Mood:  Dysphoric  Affect:  Constricted  Thought Process:  Circumstantial and Disorganized  Orientation:  Full (Time, Place, and Person)  Thought Content:  Delusions and  Paranoid Ideation  Suicidal Thoughts:  No  Homicidal Thoughts:  No  Memory:  Immediate;   Fair Recent;   Fair Remote;   Fair  Judgement:  Impaired  Insight:  Lacking  Psychomotor Activity:  Decreased  Concentration:  Fair  Recall:  [employer] of Knowledge:Fair  Language: Good  Akathisia:  No  Handed:  Right  AIMS (if indicated):     Assets:  Communication Skills Desire for Improvement Physical Health  Sleep:  Number of Hours: 4.75   Musculoskeletal: Strength & Muscle Tone: within normal limits Gait & Station: normal Patient leans: Backward  Current Medications: Current Facility-Administered Medications  Medication Dose Route Frequency Provider Last Rate Last Dose  . acetaminophen (TYLENOL) tablet 650 mg  650 mg Oral Q6H PRN Malena Peer, NP   650 mg at 05/23/14 2000  . alum & mag hydroxide-simeth (MAALOX/MYLANTA) 200-200-20 MG/5ML suspension 30 mL  30 mL Oral Q4H PRN Evanna Glenda Chroman, NP      . amLODipine (NORVASC) tablet 5 mg  5 mg Oral BID Malena Peer, NP   5 mg at 05/24/14 0807  . aspirin EC tablet 81 mg  81 mg Oral Daily Evanna Cori Greig Castilla, NP   81 mg at 05/24/14 0806  . benztropine (COGENTIN) tablet 0.5 mg  0.5 mg Oral Daily Aldeen Riga, MD   0.5 mg at 05/24/14 1159  . cloNIDine (CATAPRES) tablet 0.1 mg  0.1  mg Oral BID Malena Peer, NP   0.1 mg at 05/24/14 0807  . furosemide (LASIX) tablet 40 mg  40 mg Oral BID Malena Peer, NP   40 mg at 05/24/14 0806  . gabapentin (NEURONTIN) capsule 400 mg  400 mg Oral TID Malena Peer, NP   400 mg at 05/24/14 1159  . [START ON 05/25/2014] haloperidol (HALDOL) tablet 10 mg  10 mg Oral Daily Ezekial Arns, MD      . labetalol (NORMODYNE) tablet 200 mg  200 mg Oral BID Malena Peer, NP   200 mg at 05/24/14 0807  . lamoTRIgine (LAMICTAL) tablet 100 mg  100 mg Oral Daily Liam Bossman, MD      . linagliptin (TRADJENTA) tablet 5 mg  5 mg Oral Daily Evanna Glenda Chroman, NP   5 mg at  05/24/14 0807  . magnesium hydroxide (MILK OF MAGNESIA) suspension 30 mL  30 mL Oral Daily PRN Evanna Glenda Chroman, NP      . nicotine polacrilex (NICORETTE) gum 2 mg  2 mg Oral PRN Ursula Alert, MD   2 mg at 05/24/14 1250  . potassium chloride (K-DUR) CR tablet 10 mEq  10 mEq Oral BID Malena Peer, NP   10 mEq at 05/24/14 0806  . traZODone (DESYREL) tablet 100 mg  100 mg Oral QHS Malena Peer, NP   100 mg at 05/23/14 2108  . Vitamin D (Ergocalciferol) (DRISDOL) capsule 50,000 Units  50,000 Units Oral Q21 days Malena Peer, NP   50,000 Units at 05/21/14 1201    Lab Results:  Results for orders placed during the hospital encounter of 05/20/14 (from the past 48 hour(s))  GLUCOSE, CAPILLARY     Status: Abnormal   Collection Time    05/23/14  5:16 AM      Result Value Ref Range   Glucose-Capillary 119 (*) 70 - 99 mg/dL  GLUCOSE, CAPILLARY     Status: Abnormal   Collection Time    05/24/14  6:06 AM      Result Value Ref Range   Glucose-Capillary 138 (*) 70 - 99 mg/dL    Physical Findings: AIMS: Facial and Oral Movements Muscles of Facial Expression: None, normal Lips and Perioral Area: None, normal Jaw: None, normal Tongue: None, normal,Extremity Movements Upper (arms, wrists, hands, fingers): None, normal Lower (legs, knees, ankles, toes): None, normal, Trunk Movements Neck, shoulders, hips: None, normal, Overall Severity Severity of abnormal movements (highest score from questions above): None, normal Incapacitation due to abnormal movements: None, normal Patient's awareness of abnormal movements (rate only patient's report): No Awareness, Dental Status Current problems with teeth and/or dentures?: Yes (missing lower teeth) Does patient usually wear dentures?: No  CIWA:  CIWA-Ar Total: 1 COWS:     Treatment Plan Summary: Daily contact with patient to assess and evaluate symptoms and progress in treatment Medication management  Plan:1. Continue admission  for crisis management and stabilization. 2. Medication management to reduce current symptoms to base line and improve the  patient's overall level of functioning: Will continue Lamictal at a higher dose . Will DC Depakote secondary to elevated LFTs (his Depakote was dc ed last admission due to the same). Continue Gabapentin 400 mg po tid. Will order LFTs. Increase Haldol to  10 mg po daily. Continue Cogentin for EPS. Continue Trazodone 100mg  Qhs for insomnia 3. Treat health problems as indicated.For pedal edema -will elevate the foot end of bed. Hospitalist saw patient last admission.Per hospitalist patient  will  need strict follow up with nephrology to monitor renal function while he is on lasix.    Will repeat BMP in AM.    Will continue Lasix 40 mg bid, clonidine 0.1 mg bid, Norvasc 5 mg po daily,Labetolol 200 mg po bid, aspirin 81 mg po daily. 4. Develop treatment plan to decrease risk of relapse upon discharge and the need for  readmission. 5. Psycho-social education regarding relapse prevention and self care.   Medical Decision Making Problem Points:  Established problem, stable/improving (1), Review of last therapy session (1) and Review of psycho-social stressors (1) Data Points:  Order Aims Assessment (2) Review of medication regiment & side effects (2) Review of new medications or change in dosage (2)  I certify that inpatient services furnished can reasonably be expected to improve the patient's condition.   Jhaden Pizzuto,MD 05/24/2014, 1:06 PM

## 2014-05-24 NOTE — Consult Note (Signed)
Case discussed, agree with plan 

## 2014-05-24 NOTE — [facility] Group Notes (Signed)
[facility] LCSW Group Therapy  05/24/2014 1:15 pm  Type of Therapy: Process Group Therapy  Participation Level:  Active  Participation Quality:  Appropriate  Affect:  Flat  Cognitive:  Oriented  Insight:  Limited  Engagement in Group:  Limited  Engagement in Therapy:  Limited  Modes of Intervention:  Activity, Clarification, Education, Problem-solving and Support  Summary of Progress/Problems: Today's group addressed the issue of overcoming obstacles.  Patients were asked to identify their biggest obstacle post d/c that stands in the way of their on-going success, and then problem solve as to how to manage this. Ivan [name] sat with eyes closed for most of group, but when called on, responded in a way that indicated he was awake and paying attention.  Stated his obstacle that he has overcome is "hanging around the wrong crowd," but declined to be more specific.  He said this made him feel bad that he could not see them anymore, "but sometimes you have to be cold with others for yourself."  Stated his biggest obstacle gong forward is "continuing to stay away from negative people."  Ivan [name] 05/24/2014   4:39 PM

## 2014-05-24 NOTE — Progress Notes (Signed)
The focus of this group is to help patients review their daily goal of treatment and discuss progress on daily workbooks. Pt attended the evening group session and responded to all discussion prompts from the [place]. Pt told the Writer that today was a good day on the unit, though he didn't attend very many groups. "I attended one group this afternoon and it was very good." Pt told the Writer he had no additional requests from Nursing Staff this evening. Pt said that upon discharge he planned to stay on his medications and not let things stress him out. "I need to take care of me and not let other people bother me." Pt's affect was flat when speaking/being spoken to and appeared to be asleep for the rest of group.

## 2014-05-24 NOTE — Progress Notes (Signed)
Patient ID: Ivan [name], male   DOB: [birth year], 53 y.o.   MRN: [id number] D: Patient reports feeling anxious after a phone call home. Pt will not elaborate but stated he will be fine. Pt mood and affect appeared depressed and flat. Pt denies SI/HI/AVH and pain. Pt attended evening wrap up group and engage in discussion. Pt denies any needs or concerns.  Cooperative with assessment. No acute distressed noted at this time.   A: Met with pt 1:1. Medications administered as prescribed. Writer encouraged pt to discuss feelings. Pt encouraged to come to staff with any question or concerns.   R: Patient remains safe. He is complaint with medications and denies any adverse reaction. Continue current POC.

## 2014-05-25 DIAGNOSIS — M549 Dorsalgia, unspecified: Secondary | ICD-10-CM

## 2014-05-25 DIAGNOSIS — N183 Chronic kidney disease, stage 3 (moderate): Secondary | ICD-10-CM

## 2014-05-25 DIAGNOSIS — I10 Essential (primary) hypertension: Secondary | ICD-10-CM

## 2014-05-25 DIAGNOSIS — E119 Type 2 diabetes mellitus without complications: Secondary | ICD-10-CM

## 2014-05-25 LAB — COMPREHENSIVE METABOLIC PANEL
ALK PHOS: 124 U/L — AB (ref 39–117)
ALT: 20 U/L (ref 0–53)
AST: 16 U/L (ref 0–37)
Albumin: 3.2 g/dL — ABNORMAL LOW (ref 3.5–5.2)
Anion gap: 11 (ref 5–15)
BUN: 15 mg/dL (ref 6–23)
CALCIUM: 9.3 mg/dL (ref 8.4–10.5)
CO2: 28 meq/L (ref 19–32)
Chloride: 102 mEq/L (ref 96–112)
Creatinine, Ser: 1.44 mg/dL — ABNORMAL HIGH (ref 0.50–1.35)
GFR calc Af Amer: 64 mL/min — ABNORMAL LOW (ref >90)
GFR calc non Af Amer: 55 mL/min — ABNORMAL LOW (ref >90)
Glucose, Bld: 138 mg/dL — ABNORMAL HIGH (ref 70–99)
POTASSIUM: 3.5 meq/L — AB (ref 3.7–5.3)
SODIUM: 141 meq/L (ref 137–147)
Total Bilirubin: <0.2 mg/dL — ABNORMAL LOW (ref 0.3–1.2)
Total Protein: 7.7 g/dL (ref 6.0–8.3)

## 2014-05-25 LAB — VALPROIC ACID LEVEL: Valproic Acid Lvl: 22.1 ug/mL — ABNORMAL LOW (ref 50.0–100.0)

## 2014-05-25 LAB — LIPID PANEL
CHOL/HDL RATIO: 3.5 ratio
CHOLESTEROL: 138 mg/dL (ref 0–200)
HDL: 40 mg/dL (ref >39)
LDL Cholesterol: 63 mg/dL (ref 0–99)
Triglycerides: 176 mg/dL — ABNORMAL HIGH (ref <150)
VLDL: 35 mg/dL (ref 0–40)

## 2014-05-25 LAB — TSH: TSH: 0.779 u[IU]/mL (ref 0.350–4.500)

## 2014-05-25 MED ORDER — HALOPERIDOL 10 MG PO TABS: 10.0000 mg | ORAL_TABLET | Freq: Every day | ORAL | Status: DC

## 2014-05-25 MED ORDER — FUROSEMIDE 40 MG PO TABS: 40.0000 mg | ORAL_TABLET | Freq: Two times a day (BID) | ORAL | Status: DC

## 2014-05-25 MED ORDER — LABETALOL HCL 200 MG PO TABS: 200.0000 mg | ORAL_TABLET | Freq: Two times a day (BID) | ORAL | Status: DC

## 2014-05-25 MED ORDER — LAMOTRIGINE 100 MG PO TABS: 100.0000 mg | ORAL_TABLET | Freq: Every day | ORAL | Status: DC

## 2014-05-25 MED ORDER — TRAZODONE HCL 100 MG PO TABS: 100.0000 mg | ORAL_TABLET | Freq: Every day | ORAL | Status: DC

## 2014-05-25 MED ORDER — BENZTROPINE MESYLATE 0.5 MG PO TABS: 0.5000 mg | ORAL_TABLET | Freq: Every day | ORAL | Status: DC

## 2014-05-25 MED ORDER — SITAGLIPTIN PHOSPHATE 100 MG PO TABS: 100.0000 mg | ORAL_TABLET | Freq: Every day | ORAL | Status: DC

## 2014-05-25 MED ORDER — GLIPIZIDE 5 MG PO TABS: 5.0000 mg | ORAL_TABLET | Freq: Every day | ORAL | Status: DC

## 2014-05-25 MED ORDER — HYDRALAZINE HCL 100 MG PO TABS: 100.0000 mg | ORAL_TABLET | Freq: Three times a day (TID) | ORAL | Status: DC

## 2014-05-25 MED ORDER — ASPIRIN EC 81 MG PO TBEC
81.0000 mg | DELAYED_RELEASE_TABLET | Freq: Every day | ORAL | Status: DC
Start: 2014-05-25 — End: 2014-07-06

## 2014-05-25 MED ORDER — CLONIDINE HCL 0.1 MG PO TABS: 0.1000 mg | ORAL_TABLET | Freq: Two times a day (BID) | ORAL | Status: DC

## 2014-05-25 MED ORDER — AMLODIPINE BESYLATE 5 MG PO TABS: 5.0000 mg | ORAL_TABLET | Freq: Two times a day (BID) | ORAL | Status: DC

## 2014-05-25 MED ORDER — GABAPENTIN 400 MG PO CAPS: 400.0000 mg | ORAL_CAPSULE | Freq: Three times a day (TID) | ORAL | Status: DC

## 2014-05-25 MED ORDER — POTASSIUM CHLORIDE ER 10 MEQ PO TBCR: 10.0000 meq | EXTENDED_RELEASE_TABLET | Freq: Two times a day (BID) | ORAL | Status: DC

## 2014-05-25 MED ORDER — VITAMIN D (ERGOCALCIFEROL) 1.25 MG (50000 UNIT) PO CAPS: 50000.0000 [IU] | ORAL_CAPSULE | ORAL | Status: DC

## 2014-05-25 NOTE — [facility] Group Notes (Signed)
[facility] LCSW Group Therapy  05/25/2014 1:32 PM  Type of Therapy:  Group Therapy  Participation Level:  Minimal  Participation Quality:  Drowsy  Affect:  Flat  Cognitive:  Oriented  Insight:  Limited  Engagement in Therapy:  Limited  Modes of Intervention:  Clarification, Confrontation, Discussion, Education, Exploration, Limit-setting, Problem-solving, Rapport Building, Socialization and Support  Summary of Progress/Problems: Feelings around Diagnosis-Patients were encouraged to process their feelings surrounding their mental health diagnosis, discuss how they view their diagnosis, and identify the positive and negative aspects of having a mental health diagnosis. Maceo came to group about ten minutes late. He shared that he feels that his diagnosis is a label given to him by "people that barely know me." Jaimen was resistant to exploring his feelings further but actively listened as others shared their perspectives. Dahl was drowsy during group with flat affect. He was unable to identify a positive support.   Smart, Dollie Bressi LCSWA  05/25/2014, 1:32 PM

## 2014-05-25 NOTE — B[facility] Suicide Risk Assessment (Addendum)
Demographic Factors:  Male and Divorced or widowed  Total Time spent with patient: 20 minutes  Psychiatric Specialty Exam: Physical Exam  Constitutional: He is oriented to person, place, and time. He appears well-developed and well-nourished.  HENT:  Head: Normocephalic and atraumatic.  Neck: Normal range of motion. Neck supple.  GI: Soft.  Musculoskeletal: Normal range of motion.  Neurological: He is alert and oriented to person, place, and time.  Skin: Skin is warm.  Psychiatric: He has a normal mood and affect. His speech is normal and behavior is normal. Thought content is not paranoid. Cognition and memory are normal. He expresses impulsivity (IMPROVING). He expresses no homicidal and no suicidal ideation.    Review of Systems  Constitutional: Negative.   HENT: Negative.   Eyes: Negative.   Respiratory: Negative.   Cardiovascular: Negative.   Gastrointestinal: Negative.   Genitourinary: Negative.   Musculoskeletal: Positive for myalgias.  Skin: Negative.   Neurological: Negative.   Psychiatric/Behavioral: Negative for depression, suicidal ideas and hallucinations. The patient is not nervous/anxious and does not have insomnia.     Blood pressure 111/72, pulse 73, temperature 97.6 F (36.4 C), temperature source Oral, resp. rate 20, height 6' (1.829 m), weight 126.1 kg (278 lb), SpO2 98.00%.Body mass index is 37.7 kg/(m^2).  General Appearance: Casual  Eye Contact::  Fair  Speech:  Clear and Coherent  Volume:  Normal  Mood:  Euthymic  Affect:  Congruent  Thought Process:  Coherent  Orientation:  Full (Time, Place, and Person)  Thought Content:  WDL  Suicidal Thoughts:  No  Homicidal Thoughts:  No  Memory:  Immediate;   Fair Recent;   Fair Remote;   Fair  Judgement:  Fair  Insight:  Fair  Psychomotor Activity:  Normal  Concentration:  Fair  Recall:  [employer] of [place]  Language: Fair  Akathisia:  No    AIMS (if indicated):   0  Assets:   Communication Skills Desire for Improvement  Sleep:  Number of Hours: 5.75    Musculoskeletal: Strength & Muscle Tone: within normal limits Gait & Station: normal Patient leans: N/A   Mental Status Per Nursing Assessment::   On Admission:  NA  Current Mental Status by Physician: Denies SI/HI/AH/VH  Loss Factors: Decline in physical health  Historical Factors: Family history of mental illness or substance abuse and Impulsivity  Risk Reduction Factors:   Positive therapeutic relationship  Continued Clinical Symptoms:  Previous Psychiatric Diagnoses and Treatments Medical Diagnoses and Treatments/Surgeries  Cognitive Features That Contribute To Risk:  Closed-mindedness    Suicide Risk:  Minimal: No identifiable suicidal ideation.  Patients presenting with no risk factors but with morbid ruminations; may be classified as minimal risk based on the severity of the depressive symptoms  Discharge Diagnoses:  DSM5:  Primary psychiatric diagnosis:  Schizophrenia,multiple episodes,currently in acute episode (resolved)  Secondary psychiatric diagnosis:  Non compliance with medical treatment / Medications.   Non psychiatric diagnosis:  Diabetes Mellitus  Hypertension  Chronic back pain  Pedal edema  Hx of Elevated LFTs due to Depakote CKD Stage III    Past Medical History  Diagnosis Date  . Diabetes mellitus   . Hypertension   . Bipolar 1 disorder   . Schizoaffective disorder   . Hypokalemia 04/02/2013  . Edema of both legs 04/07/2013  . Paranoid schizophrenia     Plan Of Care/Follow-up recommendations:  Activity:  No restrictions  Is patient on multiple antipsychotic therapies at discharge:  No  Has Patient had three or more failed trials of antipsychotic monotherapy by history:  No  Recommended Plan for Multiple Antipsychotic Therapies: NA    [name] MD 05/25/2014, 9:30 AM

## 2014-05-25 NOTE — Progress Notes (Signed)
  RD consulted for nutrition education.  Lab Results  Component Value Date   HGBA1C 6.3* 04/13/2014   Reinforced the need to avoid sugar sweetened beverages.  Gave patient a copy of the plate method handout.  "I know all about the food groups."  Patient with decreased level of alertness as well. Expect poor-fair compliance.  Patient is not ready for life style change.    Body mass index is 37.7 kg/(m^2). Pt meets criteria for obesity grade 2 based on current BMI.  Current diet order is regular, patient is consuming approximately good% of meals at this time. Labs and medications reviewed. No further nutrition interventions warranted at this time. RD contact information provided. If additional nutrition issues arise, please re-consult RD.  Antonieta Iba, RD, LDN Clinical Inpatient Dietitian Pager:  [phone] Weekend and after hours pager:  [phone]

## 2014-05-25 NOTE — Discharge Summary (Signed)
Physician Discharge Summary Note  Patient:  Ivan [name] is an 53 y.o., male MRN:  [id number] DOB:  [birth year] Patient phone:  [phone] (home)  Patient address:   [address] [place] [address],  Total Time spent with patient: 20 minutes  Date of Admission:  05/20/2014 Date of Discharge: 05/25/14  Reason for Admission:  Mood stabilization   Discharge Diagnoses: Principal Problem:   Chronic paranoid schizophrenia  Psychiatric Specialty Exam: Physical Exam  Psychiatric: He has a normal mood and affect. His speech is normal and behavior is normal. Judgment and thought content normal. Cognition and memory are normal.    Review of Systems  Constitutional: Negative.   HENT: Negative.   Eyes: Negative.   Respiratory: Negative.   Cardiovascular: Negative.   Gastrointestinal: Negative.   Genitourinary: Negative.   Musculoskeletal: Negative.   Skin: Negative.   Neurological: Negative.   Endo/Heme/Allergies: Negative.   Psychiatric/Behavioral: Negative.     Blood pressure 111/72, pulse 73, temperature 97.6 F (36.4 C), temperature source Oral, resp. rate 20, height 6' (1.829 m), weight 126.1 kg (278 lb), SpO2 98.00%.Body mass index is 37.7 kg/(m^2).  See Physician SRA                                                  Past Psychiatric History: See H&P Diagnosis:  Hospitalizations:  Outpatient Care:  Substance Abuse Care:  Self-Mutilation:  Suicidal Attempts:  Violent Behaviors:   Musculoskeletal: Strength & Muscle Tone: within normal limits Gait & Station: normal Patient leans: N/A  DSM5: Primary psychiatric diagnosis:  Schizophrenia,multiple episodes,currently in acute episode (resolved)  Secondary psychiatric diagnosis:  Non compliance with medical treatment / Medications.  Non psychiatric diagnosis:  Diabetes Mellitus  Hypertension  Chronic back pain  Pedal edema  Hx of Elevated LFTs due to Depakote  CKD Stage [name]  Level of  Care:  OP  Hospital Course:     [name] is a 53 y.o. male patient admitted with bizarre behaviors, agitation, paranoia, and refuses to cooperate with ED providers. He has a history of chronic paranoid schizophrenia and was recently discharged from [facility] in patient admission. He also has a history of medication non-compliance. During patient interview, patient remains vague about the events that took place that led prior to his admission. He becomes slightly agitated when asked to elaborate on the details of his family life. He states that his father and his sister made him come to the hospital. 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 Ivan [name] is also not able to verify his medication list and regimen.         Ivan [name] was admitted to the adult 400 unit. He was evaluated and his symptoms were identified. Medication management was discussed and initiated. Patient was started on several new medications to help stabilize his symptoms. These included Haldol for psychosis and Neurontin for agitation. His home medication of Lamictal was increased to 200 mg during his admission to help stabilize his mood. He was oriented to the unit and encouraged to participate in unit programming. Medical problems were identified and treated appropriately. A registered dietitian was consulted to educate patient about diabetic diet.  Home medication was restarted as needed. His medications to treat his Diabetes and Hypertension  were continued.         The patient was evaluated each day by a clinical provider to ascertain the patient's response to treatment.  Improvement was noted by the patient's report of decreasing symptoms, improved sleep and appetite, affect, medication tolerance, behavior, and participation in unit programming.  He was asked each day to complete a self inventory noting mood, mental status, pain, new symptoms, anxiety and  concerns.         He responded well to medication and being in a therapeutic and supportive environment. Positive and appropriate behavior was noted and the patient was motivated for recovery.  Staff noted that his mood continued to be very irritable at times. He continued to minimize his problems and focus on issues with his family. The patient worked closely with the treatment team and case manager to develop a discharge plan with appropriate goals. Coping skills, problem solving as well as relaxation therapies were also part of the unit programming.         By the day of discharge he was in much improved condition than upon admission.  Symptoms were reported as significantly decreased or resolved completely.  The patient denied SI/HI and voiced no AVH. He was motivated to continue taking medication with a goal of continued improvement in mental health. Ivan [name] was discharged home with a plan to follow up as noted below. The patient was provided with prescriptions and sample medications.           Consults:  psychiatry  Significant Diagnostic Studies:    Discharge Vitals:   Blood pressure 111/72, pulse 73, temperature 97.6 F (36.4 C), temperature source Oral, resp. rate 20, height 6' (1.829 m), weight 126.1 kg (278 lb), SpO2 98.00%. Body mass index is 37.7 kg/(m^2). Lab Results:   Results for orders placed during the hospital encounter of 05/20/14 (from the past 72 hour(s))  GLUCOSE, CAPILLARY     Status: Abnormal   Collection Time    05/23/14  5:16 AM      Result Value Ref Range   Glucose-Capillary 119 (*) 70 - 99 mg/dL  GLUCOSE, CAPILLARY     Status: Abnormal   Collection Time    05/24/14  6:06 AM      Result Value Ref Range   Glucose-Capillary 138 (*) 70 - 99 mg/dL  VALPROIC ACID LEVEL     Status: Abnormal   Collection Time    05/25/14  6:20 AM      Result Value Ref Range   Valproic Acid Lvl 22.1 (*) 50.0 - 100.0 ug/mL   Comment: Performed at [facility]     Status: Abnormal   Collection Time    05/25/14  6:20 AM      Result Value Ref Range   Cholesterol 138  0 - 200 mg/dL   Triglycerides 176 (*) <150 mg/dL   HDL 40  >39 mg/dL   Total CHOL/HDL Ratio 3.5     VLDL 35  0 - 40 mg/dL   LDL Cholesterol 63  0 - 99 mg/dL   Comment:            Total Cholesterol/HDL:CHD Risk     Coronary Heart Disease Risk Table                         Men   Women      1/2 Average Risk   3.4   3.3  Average Risk       5.0   4.4      2 X Average Risk   9.6   7.1      3 X Average Risk  23.4   11.0                Use the calculated Patient Ratio     above and the CHD Risk Table     to determine the patient's CHD Risk.                ATP [name] CLASSIFICATION (LDL):      <100     mg/dL   Optimal      100-129  mg/dL   Near or Above                        Optimal      130-159  mg/dL   Borderline      160-189  mg/dL   High      >190     mg/dL   Very High     Performed at [facility] PANEL     Status: Abnormal   Collection Time    05/25/14  6:20 AM      Result Value Ref Range   Sodium 141  137 - 147 mEq/L   Potassium 3.5 (*) 3.7 - 5.3 mEq/L   Chloride 102  96 - 112 mEq/L   CO2 28  19 - 32 mEq/L   Glucose, Bld 138 (*) 70 - 99 mg/dL   BUN 15  6 - 23 mg/dL   Creatinine, Ser 1.44 (*) 0.50 - 1.35 mg/dL   Calcium 9.3  8.4 - 10.5 mg/dL   Total Protein 7.7  6.0 - 8.3 g/dL   Albumin 3.2 (*) 3.5 - 5.2 g/dL   AST 16  0 - 37 U/L   ALT 20  0 - 53 U/L   Alkaline Phosphatase 124 (*) 39 - 117 U/L   Total Bilirubin <0.2 (*) 0.3 - 1.2 mg/dL   GFR calc non Af Amer 55 (*) >90 mL/min   GFR calc Af Amer 64 (*) >90 mL/min   Comment: (NOTE)     The eGFR has been calculated using the CKD EPI equation.     This calculation has not been validated in all clinical situations.     eGFR's persistently <90 mL/min signify possible Chronic Kidney     Disease.   Anion gap 11  5 - 15   Comment: Performed at [facility]     Physical Findings: AIMS: Facial and Oral Movements Muscles of Facial Expression: None, normal Lips and Perioral Area: None, normal Jaw: None, normal Tongue: None, normal,Extremity Movements Upper (arms, wrists, hands, fingers): None, normal Lower (legs, knees, ankles, toes): None, normal, Trunk Movements Neck, shoulders, hips: None, normal, Overall Severity Severity of abnormal movements (highest score from questions above): None, normal Incapacitation due to abnormal movements: None, normal Patient's awareness of abnormal movements (rate only patient's report): No Awareness, Dental Status Current problems with teeth and/or dentures?: Yes (missing lower teeth) Does patient usually wear dentures?: No  CIWA:  CIWA-Ar Total: 1 COWS:     Psychiatric Specialty Exam: See Psychiatric Specialty Exam and Suicide Risk Assessment completed by Attending Physician prior to discharge.  Discharge destination:  Home  Is patient on multiple antipsychotic therapies at discharge:  No   Has Patient had three or more failed  trials of antipsychotic monotherapy by history:  No  Recommended Plan for Multiple Antipsychotic Therapies: NA      Discharge Instructions   Discharge instructions    Complete by:  As directed   Please follow up with your Primary Care Provider for further management of your medical problems such as Diabetes and High Blood Pressure.            Medication List    STOP taking these medications       divalproex 500 MG DR tablet  Commonly known as:  DEPAKOTE     risperiDONE 0.5 MG tablet  Commonly known as:  RISPERDAL      TAKE these medications     Indication   amLODipine 5 MG tablet  Commonly known as:  NORVASC  Take 1 tablet (5 mg total) by mouth 2 (two) times daily.   Indication:  High Blood Pressure     aspirin EC 81 MG tablet  Take 1 tablet (81 mg total) by mouth daily.   Indication:  Heart Attack     benztropine 0.5 MG tablet  Commonly known as:   COGENTIN  Take 1 tablet (0.5 mg total) by mouth daily.   Indication:  Extrapyramidal Reaction caused by Medications     cloNIDine 0.1 MG tablet  Commonly known as:  CATAPRES  Take 1 tablet (0.1 mg total) by mouth 2 (two) times daily.   Indication:  High Blood Pressure     furosemide 40 MG tablet  Commonly known as:  LASIX  Take 1 tablet (40 mg total) by mouth 2 (two) times daily.   Indication:  High Blood Pressure     gabapentin 400 MG capsule  Commonly known as:  NEURONTIN  Take 1 capsule (400 mg total) by mouth 3 (three) times daily.   Indication:  Agitation, Nerve Pain     glipiZIDE 5 MG tablet  Commonly known as:  GLUCOTROL  Take 1 tablet (5 mg total) by mouth daily before breakfast.   Indication:  Type 2 Diabetes     haloperidol 10 MG tablet  Commonly known as:  HALDOL  Take 1 tablet (10 mg total) by mouth daily. For mood control   Indication:  Schizophrenia     hydrALAZINE 100 MG tablet  Commonly known as:  APRESOLINE  Take 1 tablet (100 mg total) by mouth 3 (three) times daily.   Indication:  High Blood Pressure     labetalol 200 MG tablet  Commonly known as:  NORMODYNE  Take 1 tablet (200 mg total) by mouth 2 (two) times daily.   Indication:  High Blood Pressure     lamoTRIgine 100 MG tablet  Commonly known as:  LAMICTAL  Take 1 tablet (100 mg total) by mouth daily.   Indication:  Schizophrenia     potassium chloride 10 MEQ tablet  Commonly known as:  K-DUR  Take 1 tablet (10 mEq total) by mouth 2 (two) times daily.   Indication:  Low Amount of Potassium in the Blood     sitaGLIPtin 100 MG tablet  Commonly known as:  JANUVIA  Take 1 tablet (100 mg total) by mouth daily.   Indication:  Type 2 Diabetes     traZODone 100 MG tablet  Commonly known as:  DESYREL  Take 1 tablet (100 mg total) by mouth at bedtime.   Indication:  Trouble Sleeping     Vitamin D (Ergocalciferol) 50000 UNITS Caps capsule  Commonly known as:  DRISDOL  Take 1 capsule (  50,000  Units total) by mouth every 21 ( twenty-one) days.   Indication:  Vitamin D Deficiency       Follow-up Information   Follow up with Tamela Gammon . (appt needed by d/c date)    Contact information:   [address] [place], [place] [address] Phone: [phone] Fax: [phone]      Follow-up recommendations:   Activity: No restrictions,patient to follow up with aftercare as scheduled   Comments:   Take all your medications as prescribed by your mental healthcare provider.  Report any adverse effects and or reactions from your medicines to your outpatient provider promptly.  Patient is instructed and cautioned to not engage in alcohol and or illegal drug use while on prescription medicines.  In the event of worsening symptoms, patient is instructed to call the crisis hotline, 911 and or go to the nearest ED for appropriate evaluation and treatment of symptoms.  Follow-up with your primary care provider for your other medical issues, concerns and or health care needs.   Total Discharge Time:  Greater than 30 minutes.  SignedElmarie Shiley NP-C 05/25/2014, 9:51 AM

## 2014-05-25 NOTE — Progress Notes (Signed)
Discharge Note:  Patient discharged home with sheriff.  Denied SI and HI.  Denied A/V hallucinations.  Suicide prevention information given and discussed with patient who stated he understood and had no questions.  Patient stated he received all his belongings, clothing, misc items, toiletries, shoes, prescriptions, medications.  Patient stated he appreciated all assistance received from [facility] staff.

## 2014-05-25 NOTE — Progress Notes (Signed)
[facility] Adult Case Management Discharge Plan :  Will you be returning to the same living situation after discharge: Yes,  home At discharge, do you have transportation home?:Yes,  sheriff Do you have the ability to pay for your medications:Yes,  MCR/mental health  Release of information consent forms completed and in the chart;  Patient's signature needed at discharge.  Patient to Follow up at: Follow-up Information   Follow up with Tamela Gammon  On 05/27/2014. (Go to the walk-in clinic between 8 and 11 for your hospital follow up appointment)    Contact information:   [address] [place], [place] [address] Phone: [phone] Fax: [phone]      Patient denies SI/HI:   Yes,  yes    Safety Planning and Suicide Prevention discussed:  Yes,  yes  Trish Mage 05/25/2014, 9:56 AM

## 2014-05-25 NOTE — Progress Notes (Signed)
The focus of this group is to educate the patient on the purpose and policies of crisis stabilization and provide a format to answer questions about their admission.  The group details unit policies and expectations of patients while admitted.  Patient did not attend nurse education orientation group this morning.  Patient stayed in his room.

## 2014-05-25 NOTE — Progress Notes (Signed)
D:  Patient's self inventory sheet, patient sleeps good, takes sleep medication which is helpful.  Good appetite, normal energy level, good concentration.  Rated depression 5, hopeless 6 and half, anxiety 5.  Denied withdrawals.  Denied SI.  Has experienced pain, headache in past 24 hours.  Pain medication helpful, back/arm #6.  Plans to discharge home.  Does have discharge plans.  No problems taking medications after discharge. A:  Medications administered per MD orders.  Emotional support and encouragement given patient. R:  Denied SI and HI.  Denied A/V hallucinations.  Safety maintained with 15 minute checks.

## 2014-05-25 NOTE — Tx Team (Signed)
  Interdisciplinary Treatment Plan Update   Date Reviewed:  05/25/2014  Time Reviewed:  9:53 AM  Progress in Treatment:   Attending groups: Yes Participating in groups: Yes Taking medication as prescribed: Yes  Tolerating medication: Yes Family/Significant other contact made: Yes  Patient understands diagnosis: Yes  Discussing patient identified problems/goals with staff: Yes  See initial care plan Medical problems stabilized or resolved: Yes Denies suicidal/homicidal ideation: Yes  In tx team Patient has not harmed self or others: Yes  For review of initial/current patient goals, please see plan of care.  Estimated Length of Stay:  D/C today  Reason for Continuation of Hospitalization:   New Problems/Goals identified:  N/A  Discharge Plan or Barriers:   return home, follow up outpt  Additional Comments:  Attendees:  Signature: Steva Colder, MD 05/25/2014 9:53 AM   Signature: Ripley Fraise, LCSW 05/25/2014 9:53 AM  Signature: Elmarie Shiley, NP 05/25/2014 9:53 AM  Signature: Mayra Neer, RN 05/25/2014 9:53 AM  Signature: Darrol Angel, RN 05/25/2014 9:53 AM  Signature:  05/25/2014 9:53 AM  Signature:   05/25/2014 9:53 AM  Signature:    Signature:    Signature:    Signature:    Signature:    Signature:      Scribe for Treatment Team:   Ripley Fraise, LCSW  05/25/2014 9:53 AM

## 2014-05-27 NOTE — Progress Notes (Signed)
Patient Discharge Instructions:  After Visit Summary (AVS):   Faxed to:  05/27/14 Discharge Summary Note:   Faxed to:  05/27/14 Psychiatric Admission Assessment Note:   Faxed to:  05/27/14 Suicide Risk Assessment - Discharge Assessment:   Faxed to:  05/27/14 Faxed/Sent to the Next Level Care provider:  05/27/14 Faxed to [facility] @ [place], 05/27/2014, 4:11 PM

## 2014-05-29 NOTE — Discharge Summary (Signed)
Patient was seen face to face for psychiatric evaluation, suicide risk assessment and case discussed with treatment team and NP and made appropriate disposition plans. Reviewed the information documented and agree with the treatment plan.   Ivan [name] ,MD Attending Psychiatrist  [facility]    

## 2014-06-01 ENCOUNTER — Other Ambulatory Visit: Payer: Self-pay | Admitting: Nurse Practitioner

## 2014-06-30 ENCOUNTER — Encounter (HOSPITAL_COMMUNITY): Payer: Self-pay

## 2014-06-30 ENCOUNTER — Emergency Department (HOSPITAL_COMMUNITY)
Admission: EM | Admit: 2014-06-30 | Discharge: 2014-07-01 | Disposition: A | Discharge status: Other Acute Inpatient Hospital | Payer: Medicare Other | Attending: Emergency Medicine | Admitting: Emergency Medicine

## 2014-06-30 DIAGNOSIS — F3170 Bipolar disorder, currently in remission, most recent episode unspecified: Secondary | ICD-10-CM | POA: Diagnosis not present

## 2014-06-30 DIAGNOSIS — E119 Type 2 diabetes mellitus without complications: Secondary | ICD-10-CM | POA: Insufficient documentation

## 2014-06-30 DIAGNOSIS — Z720 Tobacco use: Secondary | ICD-10-CM | POA: Diagnosis not present

## 2014-06-30 DIAGNOSIS — E876 Hypokalemia: Secondary | ICD-10-CM | POA: Diagnosis not present

## 2014-06-30 DIAGNOSIS — I10 Essential (primary) hypertension: Secondary | ICD-10-CM | POA: Insufficient documentation

## 2014-06-30 DIAGNOSIS — F259 Schizoaffective disorder, unspecified: Secondary | ICD-10-CM | POA: Insufficient documentation

## 2014-06-30 DIAGNOSIS — Z79899 Other long term (current) drug therapy: Secondary | ICD-10-CM | POA: Diagnosis not present

## 2014-06-30 DIAGNOSIS — Z046 Encounter for general psychiatric examination, requested by authority: Secondary | ICD-10-CM | POA: Diagnosis present

## 2014-06-30 DIAGNOSIS — F319 Bipolar disorder, unspecified: Secondary | ICD-10-CM

## 2014-06-30 DIAGNOSIS — Z7982 Long term (current) use of aspirin: Secondary | ICD-10-CM | POA: Diagnosis not present

## 2014-06-30 LAB — URINALYSIS, ROUTINE W REFLEX MICROSCOPIC
BILIRUBIN URINE: NEGATIVE
Glucose, UA: NEGATIVE mg/dL
Ketones, ur: NEGATIVE mg/dL
LEUKOCYTES UA: NEGATIVE
NITRITE: NEGATIVE
Protein, ur: >300 mg/dL — AB
UROBILINOGEN UA: 0.2 mg/dL (ref 0.0–1.0)
pH: 6 (ref 5.0–8.0)

## 2014-06-30 LAB — CBC WITH DIFFERENTIAL/PLATELET
BASOS ABS: 0 10*3/uL (ref 0.0–0.1)
BASOS PCT: 1 % (ref 0–1)
Eosinophils Absolute: 0.2 10*3/uL (ref 0.0–0.7)
Eosinophils Relative: 3 % (ref 0–5)
HCT: 34 % — ABNORMAL LOW (ref 39.0–52.0)
Hemoglobin: 11.3 g/dL — ABNORMAL LOW (ref 13.0–17.0)
Lymphocytes Relative: 31 % (ref 12–46)
Lymphs Abs: 1.8 10*3/uL (ref 0.7–4.0)
MCH: 29.3 pg (ref 26.0–34.0)
MCHC: 33.2 g/dL (ref 30.0–36.0)
MCV: 88.1 fL (ref 78.0–100.0)
MONO ABS: 0.6 10*3/uL (ref 0.1–1.0)
Monocytes Relative: 10 % (ref 3–12)
NEUTROS ABS: 3.2 10*3/uL (ref 1.7–7.7)
NEUTROS PCT: 55 % (ref 43–77)
Platelets: 364 10*3/uL (ref 150–400)
RBC: 3.86 MIL/uL — ABNORMAL LOW (ref 4.22–5.81)
RDW: 15.1 % (ref 11.5–15.5)
WBC: 5.8 10*3/uL (ref 4.0–10.5)

## 2014-06-30 LAB — COMPREHENSIVE METABOLIC PANEL
ALK PHOS: 115 U/L (ref 39–117)
ALT: 39 U/L (ref 0–53)
ANION GAP: 13 (ref 5–15)
AST: 65 U/L — ABNORMAL HIGH (ref 0–37)
Albumin: 3.1 g/dL — ABNORMAL LOW (ref 3.5–5.2)
BILIRUBIN TOTAL: 0.3 mg/dL (ref 0.3–1.2)
BUN: 22 mg/dL (ref 6–23)
CHLORIDE: 103 meq/L (ref 96–112)
CO2: 27 mEq/L (ref 19–32)
CREATININE: 1.73 mg/dL — AB (ref 0.50–1.35)
Calcium: 9.3 mg/dL (ref 8.4–10.5)
GFR calc non Af Amer: 44 mL/min — ABNORMAL LOW (ref >90)
GFR, EST AFRICAN AMERICAN: 51 mL/min — AB (ref >90)
Glucose, Bld: 101 mg/dL — ABNORMAL HIGH (ref 70–99)
POTASSIUM: 3.3 meq/L — AB (ref 3.7–5.3)
Sodium: 143 mEq/L (ref 137–147)
Total Protein: 7.4 g/dL (ref 6.0–8.3)

## 2014-06-30 LAB — RAPID URINE DRUG SCREEN, HOSP PERFORMED
Amphetamines: NOT DETECTED
BARBITURATES: NOT DETECTED
Benzodiazepines: NOT DETECTED
COCAINE: NOT DETECTED
Opiates: NOT DETECTED
Tetrahydrocannabinol: NOT DETECTED

## 2014-06-30 LAB — ETHANOL: Alcohol, Ethyl (B): <11 mg/dL (ref 0–11)

## 2014-06-30 LAB — CBG MONITORING, ED: Glucose-Capillary: 129 mg/dL — ABNORMAL HIGH (ref 70–99)

## 2014-06-30 LAB — URINE MICROSCOPIC-ADD ON

## 2014-06-30 MED ORDER — HYDRALAZINE HCL 25 MG PO TABS
100.0000 mg | ORAL_TABLET | Freq: Three times a day (TID) | ORAL | Status: DC
  Administered 2014-06-30 – 2014-07-01 (×3): 100 mg via ORAL
  Filled 2014-06-30 (×9): qty 4

## 2014-06-30 MED ORDER — ACETAMINOPHEN 325 MG PO TABS
650.0000 mg | ORAL_TABLET | ORAL | Status: DC | PRN
  Administered 2014-07-01: 650 mg via ORAL
  Filled 2014-06-30: qty 2

## 2014-06-30 MED ORDER — LAMOTRIGINE 100 MG PO TABS
100.0000 mg | ORAL_TABLET | Freq: Every day | ORAL | Status: DC
  Administered 2014-06-30 – 2014-07-01 (×2): 100 mg via ORAL
  Filled 2014-06-30 (×4): qty 1

## 2014-06-30 MED ORDER — ZOLPIDEM TARTRATE 5 MG PO TABS
10.0000 mg | ORAL_TABLET | Freq: Every evening | ORAL | Status: DC | PRN
  Administered 2014-06-30: 10 mg via ORAL
  Filled 2014-06-30: qty 2

## 2014-06-30 MED ORDER — TRAZODONE HCL 50 MG PO TABS
100.0000 mg | ORAL_TABLET | Freq: Every day | ORAL | Status: DC
  Administered 2014-06-30: 100 mg via ORAL
  Filled 2014-06-30: qty 2

## 2014-06-30 MED ORDER — ASPIRIN EC 81 MG PO TBEC
81.0000 mg | DELAYED_RELEASE_TABLET | Freq: Every day | ORAL | Status: DC
  Administered 2014-06-30 – 2014-07-01 (×2): 81 mg via ORAL
  Filled 2014-06-30 (×2): qty 1

## 2014-06-30 MED ORDER — IBUPROFEN 400 MG PO TABS: 600.0000 mg | ORAL_TABLET | Freq: Three times a day (TID) | ORAL | Status: DC | PRN

## 2014-06-30 MED ORDER — POTASSIUM CHLORIDE CRYS ER 20 MEQ PO TBCR: 10.0000 meq | EXTENDED_RELEASE_TABLET | Freq: Two times a day (BID) | ORAL | Status: DC

## 2014-06-30 MED ORDER — AMLODIPINE BESYLATE 5 MG PO TABS
5.0000 mg | ORAL_TABLET | Freq: Two times a day (BID) | ORAL | Status: DC
  Administered 2014-06-30 – 2014-07-01 (×3): 5 mg via ORAL
  Filled 2014-06-30 (×3): qty 1

## 2014-06-30 MED ORDER — GLIPIZIDE 5 MG PO TABS
5.0000 mg | ORAL_TABLET | Freq: Every day | ORAL | Status: DC
  Administered 2014-07-01: 5 mg via ORAL
  Filled 2014-06-30 (×3): qty 1

## 2014-06-30 MED ORDER — POTASSIUM CHLORIDE CRYS ER 20 MEQ PO TBCR
40.0000 meq | EXTENDED_RELEASE_TABLET | Freq: Two times a day (BID) | ORAL | Status: DC
  Administered 2014-06-30 – 2014-07-01 (×3): 40 meq via ORAL
  Filled 2014-06-30 (×3): qty 2

## 2014-06-30 MED ORDER — LINAGLIPTIN 5 MG PO TABS
5.0000 mg | ORAL_TABLET | Freq: Every day | ORAL | Status: DC
  Administered 2014-06-30 – 2014-07-01 (×2): 5 mg via ORAL
  Filled 2014-06-30 (×4): qty 1

## 2014-06-30 MED ORDER — CLONIDINE HCL 0.1 MG PO TABS
0.1000 mg | ORAL_TABLET | Freq: Two times a day (BID) | ORAL | Status: DC
  Administered 2014-06-30 – 2014-07-01 (×3): 0.1 mg via ORAL
  Filled 2014-06-30 (×4): qty 1

## 2014-06-30 MED ORDER — ONDANSETRON HCL 4 MG PO TABS: 4.0000 mg | ORAL_TABLET | Freq: Three times a day (TID) | ORAL | Status: DC | PRN

## 2014-06-30 MED ORDER — NICOTINE 21 MG/24HR TD PT24
21.0000 mg | MEDICATED_PATCH | Freq: Every day | TRANSDERMAL | Status: DC
  Filled 2014-06-30 (×2): qty 1

## 2014-06-30 MED ORDER — LABETALOL HCL 200 MG PO TABS
200.0000 mg | ORAL_TABLET | Freq: Two times a day (BID) | ORAL | Status: DC
  Administered 2014-06-30 – 2014-07-01 (×3): 200 mg via ORAL
  Filled 2014-06-30 (×7): qty 1

## 2014-06-30 MED ORDER — BENZTROPINE MESYLATE 1 MG PO TABS
0.5000 mg | ORAL_TABLET | Freq: Every day | ORAL | Status: DC
  Administered 2014-06-30 – 2014-07-01 (×2): 0.5 mg via ORAL
  Filled 2014-06-30 (×2): qty 1

## 2014-06-30 MED ORDER — LORAZEPAM 1 MG PO TABS
1.0000 mg | ORAL_TABLET | Freq: Three times a day (TID) | ORAL | Status: DC | PRN
  Administered 2014-06-30: 1 mg via ORAL
  Filled 2014-06-30: qty 1

## 2014-06-30 MED ORDER — GABAPENTIN 400 MG PO CAPS
400.0000 mg | ORAL_CAPSULE | Freq: Three times a day (TID) | ORAL | Status: DC
  Administered 2014-06-30 – 2014-07-01 (×3): 400 mg via ORAL
  Filled 2014-06-30 (×3): qty 1

## 2014-06-30 MED ORDER — ALUM & MAG HYDROXIDE-SIMETH 200-200-20 MG/5ML PO SUSP: 30.0000 mL | ORAL | Status: DC | PRN

## 2014-06-30 MED ORDER — VITAMIN D (ERGOCALCIFEROL) 1.25 MG (50000 UNIT) PO CAPS
50000.0000 [IU] | ORAL_CAPSULE | ORAL | Status: DC
  Administered 2014-06-30: 50000 [IU] via ORAL
  Filled 2014-06-30: qty 1

## 2014-06-30 MED ORDER — POTASSIUM CHLORIDE CRYS ER 10 MEQ PO TBCR
10.0000 meq | EXTENDED_RELEASE_TABLET | Freq: Two times a day (BID) | ORAL | Status: DC
  Filled 2014-06-30 (×3): qty 1

## 2014-06-30 MED ORDER — FUROSEMIDE 40 MG PO TABS
40.0000 mg | ORAL_TABLET | Freq: Two times a day (BID) | ORAL | Status: DC
  Administered 2014-06-30 – 2014-07-01 (×2): 40 mg via ORAL
  Filled 2014-06-30 (×2): qty 1

## 2014-06-30 MED ORDER — HALOPERIDOL 5 MG PO TABS
10.0000 mg | ORAL_TABLET | Freq: Every day | ORAL | Status: DC
  Administered 2014-06-30 – 2014-07-01 (×2): 10 mg via ORAL
  Filled 2014-06-30 (×2): qty 2

## 2014-06-30 NOTE — ED Notes (Signed)
Pt brought by sheriff dept after family took out IVC paperwork. Per paper work , pt has been off his medications and was found wandering with two hammers in hand and confused." pt non-verbal.

## 2014-06-30 NOTE — ED Provider Notes (Addendum)
CSN: [id number]     Arrival date & time 06/30/14  1033 History  This chart was scribed for Janice Norrie, MD by Molli Posey, ED Scribe. This patient was seen in room APA17/APA17 and the patient's care was started 11:00 AM.     Chief Complaint  Patient presents with  . V70.1   The history is provided by the patient. No language interpreter was used.    Level V caveat for psychiatric illness  HPI Comments: Ivan [name] is a 53 y.o. male with a history of DM who presents to the Emergency Department and reports recent family problems that have "gone too far this time". Pt refuses to discuss what is going on at this time. Per IVC paperwork "pt has been off his medications and was found wandering with two hammers in hand and confused". Pt non-verbal. They also state he has been knocking on the neighbor's doors and acting aggressively. They state he has been walking around with 2 hammers. Pt reports a history of smoking. He says he does not drink very often. He lives by himself and says he is on disability. He states he is not physically sick. Pt reports no symptoms or modifying factors at this time.   PCP Dr. Eula Fried   Past Medical History  Diagnosis Date  . Diabetes mellitus   . Hypertension   . Bipolar 1 disorder   . Schizoaffective disorder   . Hypokalemia 04/02/2013  . Edema of both legs 04/07/2013  . Paranoid schizophrenia    Past Surgical History  Procedure Laterality Date  . Breast surgery    . Fracture surgery      ribs and arm   No family history on file. History  Substance Use Topics  . Smoking status: Current Every Day Smoker -- 0.50 packs/day for 39 years    Types: Cigarettes, Cigars  . Smokeless tobacco: Never Used  . Alcohol Use: 1.2 oz/week    1 Glasses of wine, 1 Cans of beer per week     Comment: once or twice a month   lives alone per patient, but actually lives in [place] On disability   Review of Systems  Psychiatric/Behavioral: Positive for  agitation.  All other systems reviewed and are negative.     Allergies  Bee venom  Home Medications   Prior to Admission medications   Medication Sig Start Date End Date Taking? Authorizing Provider  amLODipine (NORVASC) 5 MG tablet Take 1 tablet (5 mg total) by mouth 2 (two) times daily. 05/25/14   Elmarie Shiley, NP  aspirin EC 81 MG tablet Take 1 tablet (81 mg total) by mouth daily. 05/25/14   Elmarie Shiley, NP  benztropine (COGENTIN) 0.5 MG tablet Take 1 tablet (0.5 mg total) by mouth daily. 05/25/14   Elmarie Shiley, NP  cloNIDine (CATAPRES) 0.1 MG tablet Take 1 tablet (0.1 mg total) by mouth 2 (two) times daily. 05/25/14   Elmarie Shiley, NP  furosemide (LASIX) 40 MG tablet Take 1 tablet (40 mg total) by mouth 2 (two) times daily. 05/25/14   Elmarie Shiley, NP  gabapentin (NEURONTIN) 400 MG capsule Take 1 capsule (400 mg total) by mouth 3 (three) times daily. 05/25/14   Elmarie Shiley, NP  glipiZIDE (GLUCOTROL) 5 MG tablet Take 1 tablet (5 mg total) by mouth daily before breakfast. 05/25/14   Elmarie Shiley, NP  haloperidol (HALDOL) 10 MG tablet Take 1 tablet (10 mg total) by mouth daily. For mood control 05/25/14   Mickel Baas  Rosana Hoes, NP  hydrALAZINE (APRESOLINE) 100 MG tablet Take 1 tablet (100 mg total) by mouth 3 (three) times daily. 05/25/14   Elmarie Shiley, NP  labetalol (NORMODYNE) 200 MG tablet Take 1 tablet (200 mg total) by mouth 2 (two) times daily. 05/25/14   Elmarie Shiley, NP  lamoTRIgine (LAMICTAL) 100 MG tablet Take 1 tablet (100 mg total) by mouth daily. 05/25/14   Elmarie Shiley, NP  potassium chloride (K-DUR) 10 MEQ tablet Take 1 tablet (10 mEq total) by mouth 2 (two) times daily. 05/25/14   Elmarie Shiley, NP  sitaGLIPtin (JANUVIA) 100 MG tablet Take 1 tablet (100 mg total) by mouth daily. 05/25/14   Elmarie Shiley, NP  traZODone (DESYREL) 100 MG tablet Take 1 tablet (100 mg total) by mouth at bedtime. 05/25/14   Elmarie Shiley, NP  Vitamin D, Ergocalciferol, (DRISDOL) 50000 UNITS CAPS capsule Take 1 capsule (50,000  Units total) by mouth every 21 ( twenty-one) days. 05/25/14   Elmarie Shiley, NP   BP 185/105 mmHg  Pulse 95  Temp(Src) 97.8 F (36.6 C) (Oral)  Resp 16  SpO2 99%  Vital signs normal except hypertension (not taking meds)  Physical Exam  Constitutional: He is oriented to person, place, and time. He appears well-developed and well-nourished.  Non-toxic appearance. He does not appear ill. No distress.  Uncooperative. Says his eyes are open when they are closed.   HENT:  Head: Normocephalic and atraumatic.  Right Ear: External ear normal.  Left Ear: External ear normal.  Nose: Nose normal. No mucosal edema or rhinorrhea.  Mouth/Throat: Oropharynx is clear and moist and mucous membranes are normal. No dental abscesses or uvula swelling.  Eyes: Conjunctivae and EOM are normal. Pupils are equal, round, and reactive to light.  Neck: Normal range of motion and full passive range of motion without pain. Neck supple.  Cardiovascular: Normal rate, regular rhythm and normal heart sounds.  Exam reveals no gallop and no friction rub.   No murmur heard. Pulmonary/Chest: Effort normal and breath sounds normal. No respiratory distress. He has no wheezes. He has no rhonchi. He has no rales. He exhibits no tenderness and no crepitus.  Refuses to take big deep breaths.   Abdominal: Soft. Normal appearance and bowel sounds are normal. He exhibits no distension. There is no tenderness. There is no rebound and no guarding.  Musculoskeletal: Normal range of motion. He exhibits no edema or tenderness.  Moves all extremities well.   Neurological: He is alert and oriented to person, place, and time. He has normal strength. No cranial nerve deficit.  Skin: Skin is warm, dry and intact. No rash noted. No erythema. No pallor.  Psychiatric: He has a normal mood and affect. His speech is normal and behavior is normal. His mood appears not anxious.  Nursing note and vitals reviewed.   ED Course  Procedures   DIAGNOSTIC STUDIES: Oxygen Saturation is 99% on RA, normal by my interpretation.    COORDINATION OF CARE: Pt's home medications were ordered. PT was started on oral potassium for his mild hypokalemia  IVC papers filled out.  Waiting for TSS evaluation  Labs Review Results for orders placed or performed during the hospital encounter of 06/30/14  Comprehensive metabolic panel  Result Value Ref Range   Sodium 143 137 - 147 mEq/L   Potassium 3.3 (L) 3.7 - 5.3 mEq/L   Chloride 103 96 - 112 mEq/L   CO2 27 19 - 32 mEq/L   Glucose, Bld 101 (H) 70 - 99 mg/dL  BUN 22 6 - 23 mg/dL   Creatinine, Ser 1.73 (H) 0.50 - 1.35 mg/dL   Calcium 9.3 8.4 - 10.5 mg/dL   Total Protein 7.4 6.0 - 8.3 g/dL   Albumin 3.1 (L) 3.5 - 5.2 g/dL   AST 65 (H) 0 - 37 U/L   ALT 39 0 - 53 U/L   Alkaline Phosphatase 115 39 - 117 U/L   Total Bilirubin 0.3 0.3 - 1.2 mg/dL   GFR calc non Af Amer 44 (L) >90 mL/min   GFR calc Af Amer 51 (L) >90 mL/min   Anion gap 13 5 - 15  CBC with Differential  Result Value Ref Range   WBC 5.8 4.0 - 10.5 K/uL   RBC 3.86 (L) 4.22 - 5.81 MIL/uL   Hemoglobin 11.3 (L) 13.0 - 17.0 g/dL   HCT 34.0 (L) 39.0 - 52.0 %   MCV 88.1 78.0 - 100.0 fL   MCH 29.3 26.0 - 34.0 pg   MCHC 33.2 30.0 - 36.0 g/dL   RDW 15.1 11.5 - 15.5 %   Platelets 364 150 - 400 K/uL   Neutrophils Relative % 55 43 - 77 %   Neutro Abs 3.2 1.7 - 7.7 K/uL   Lymphocytes Relative 31 12 - 46 %   Lymphs Abs 1.8 0.7 - 4.0 K/uL   Monocytes Relative 10 3 - 12 %   Monocytes Absolute 0.6 0.1 - 1.0 K/uL   Eosinophils Relative 3 0 - 5 %   Eosinophils Absolute 0.2 0.0 - 0.7 K/uL   Basophils Relative 1 0 - 1 %   Basophils Absolute 0.0 0.0 - 0.1 K/uL  Urinalysis, Routine w reflex microscopic  Result Value Ref Range   Color, Urine YELLOW YELLOW   APPearance CLEAR CLEAR   Specific Gravity, Urine >1.030 (H) 1.005 - 1.030   pH 6.0 5.0 - 8.0   Glucose, UA NEGATIVE NEGATIVE mg/dL   Hgb urine dipstick MODERATE (A) NEGATIVE    Bilirubin Urine NEGATIVE NEGATIVE   Ketones, ur NEGATIVE NEGATIVE mg/dL   Protein, ur >300 (A) NEGATIVE mg/dL   Urobilinogen, UA 0.2 0.0 - 1.0 mg/dL   Nitrite NEGATIVE NEGATIVE   Leukocytes, UA NEGATIVE NEGATIVE  Urine rapid drug screen (hosp performed)  Result Value Ref Range   Opiates NONE DETECTED NONE DETECTED   Cocaine NONE DETECTED NONE DETECTED   Benzodiazepines NONE DETECTED NONE DETECTED   Amphetamines NONE DETECTED NONE DETECTED   Tetrahydrocannabinol NONE DETECTED NONE DETECTED   Barbiturates NONE DETECTED NONE DETECTED  Ethanol  Result Value Ref Range   Alcohol, Ethyl (B) <11 0 - 11 mg/dL  Urine microscopic-add on  Result Value Ref Range   WBC, UA 0-2 <3 WBC/hpf   RBC / HPF 0-2 <3 RBC/hpf   Laboratory interpretation all normal except mild anemia, stable renal insufficiency, hypokalemia     Imaging Review No results found.   EKG Interpretation None      MDM   Final diagnoses:  Bipolar I disorder, most recent episode (or current) unspecified  Schizoaffective disorder, unspecified type  Hypokalemia   Disposition pending  Rolland Porter, MD, FACEP   I personally performed the services described in this documentation, which was scribed in my presence. The recorded information has been reviewed and considered.  Rolland Porter, MD, FACEP     Janice Norrie, MD 06/30/14 [place], MD 06/30/14 [phone]

## 2014-06-30 NOTE — BH Assessment (Addendum)
Tele Assessment Note   Ivan [name] is an 53 y.o. African American  male that denies SI/HI/Psychosis.  Patient was IVC by his sister.  Per documentation on the IVC the patient was walking in the street with a hammer.  Patient reports that he has been hospitalized over 15 times in a psychiatric hospital because his sister takes out IVC papers on him .  Patient reports that this is a family matter and he does not need to be in the hospital.  Patient denies any outpatient mental health services.  Patient denies physical, mental or sexual abuse.  Patient denies substance abuse.     Axis I: Mood Disorder NOS Axis II: Deferred Axis [name]:  Past Medical History  Diagnosis Date  . Diabetes mellitus   . Hypertension   . Bipolar 1 disorder   . Schizoaffective disorder   . Hypokalemia 04/02/2013  . Edema of both legs 04/07/2013  . Paranoid schizophrenia    Axis IV: economic problems, housing problems, occupational problems, other psychosocial or environmental problems, problems related to social environment, problems with access to health care services and problems with primary support group Axis V: 31-40 impairment in reality testing  Past Medical History:  Past Medical History  Diagnosis Date  . Diabetes mellitus   . Hypertension   . Bipolar 1 disorder   . Schizoaffective disorder   . Hypokalemia 04/02/2013  . Edema of both legs 04/07/2013  . Paranoid schizophrenia     Past Surgical History  Procedure Laterality Date  . Breast surgery    . Fracture surgery      ribs and arm    Family History: No family history on file.  Social History:  reports that he has been smoking Cigarettes and Cigars.  He has a 19.5 pack-year smoking history. He has never used smokeless tobacco. He reports that he drinks about 1.2 oz of alcohol per week. He reports that he does not use illicit drugs.  Additional Social History:     CIWA: CIWA-Ar BP: (!) 185/105 mmHg Pulse Rate: 95 COWS:    PATIENT  STRENGTHS: (choose at least two) Average or above average intelligence Financial means  Allergies:  Allergies  Allergen Reactions  . Bee Venom Swelling    Home Medications:  (Not in a hospital admission)  OB/GYN Status:  No LMP for male patient.  General Assessment Data Location of Assessment: [facility] Is this a Tele or Face-to-Face Assessment?: Tele Assessment Is this an Initial Assessment or a Re-assessment for this encounter?: Initial Assessment Living Arrangements: Alone Can pt return to current living arrangement?: Yes Admission Status: Involuntary Is patient capable of signing voluntary admission?: No Transfer from: Acute Hospital Referral Source: Self/Family/Friend  Medical Screening Exam ([place]) Medical Exam completed: Yes  [place] Living Arrangements: Alone Name of Psychiatrist: None Name of Therapist: NOne  Education Status Is patient currently in school?: No  Risk to self with the past 6 months Suicidal Ideation: No Suicidal Intent: No Is patient at risk for suicide?: No Suicidal Plan?: No Access to Means: No What has been your use of drugs/alcohol within the last 12 months?: None Reported Previous Attempts/Gestures: No How many times?: 0 Other Self Harm Risks: NA Triggers for Past Attempts: None known Intentional Self Injurious Behavior:  (NA) Family Suicide History:  (None Reported) Recent stressful life event(s): Other (Comment) (Family problems) Persecutory voices/beliefs?: No Depression: No Depression Symptoms:  (Patient denies any depression ) Substance abuse history and/or treatment  for substance abuse?: No Suicide prevention information given to non-admitted patients: Not applicable  Risk to Others within the past 6 months Homicidal Ideation: No Thoughts of Harm to Others: No Comment - Thoughts of Harm to Others: None Reported Current Homicidal Intent: No Current Homicidal Plan: No Access to  Homicidal Means: No Identified Victim: None Reported History of harm to others?: No Assessment of Violence: None Noted Violent Behavior Description: NA Does patient have access to weapons?: No Criminal Charges Pending?: No Describe Pending Criminal Charges: NA Does patient have a court date: No Court Date:  (NA)  Psychosis Hallucinations: None noted Delusions: None noted  Mental Status Report Appear/Hygiene: Unremarkable, In scrubs Eye Contact: Poor Motor Activity: Freedom of movement, Agitation Speech: Rapid, Pressured, Logical/coherent, Abusive Level of Consciousness: Alert Mood: Anxious Affect: Irritable, Preoccupied Anxiety Level: Moderate Thought Processes: Coherent, Relevant Judgement: Unimpaired Orientation: Person, Place, Time Obsessive Compulsive Thoughts/Behaviors: None  Cognitive Functioning Concentration: Normal Memory: Recent Intact, Remote Intact IQ: Average Insight: Fair Impulse Control: Poor Appetite: Fair Weight Loss: 0 Weight Gain: 0 Sleep: No Change Total Hours of Sleep: 8 Vegetative Symptoms: None  ADLScreening [facility] Assessment Services) Patient's cognitive ability adequate to safely complete daily activities?: Yes Patient able to express need for assistance with ADLs?: Yes Independently performs ADLs?: Yes (appropriate for developmental age)  Prior Inpatient Therapy Prior Inpatient Therapy: Yes Prior Therapy Dates: 05-19-2014 Prior Therapy Facilty/Provider(s): [facility] Reason for Treatment: psychosis  Prior Outpatient Therapy Prior Outpatient Therapy: Yes Prior Therapy Dates: 2014 Prior Therapy Facilty/Provider(s): Dr. Casimiro Needle, [facility] Reason for Treatment: Med mgnt  ADL Screening (condition at time of admission) Patient's cognitive ability adequate to safely complete daily activities?: Yes Patient able to express need for assistance with ADLs?: Yes Independently performs ADLs?: Yes (appropriate for developmental age)              Advance Directives (For Healthcare) Does patient have an advance directive?: No    Additional Information 1:1 In Past 12 Months?: No CIRT Risk: No Elopement Risk: No Does patient have medical clearance?: Yes     Disposition: Pending psych disposition   Disposition Initial Assessment Completed for this Encounter: Yes Disposition of Patient: Other dispositions Type of inpatient treatment program: Adult Other disposition(s): Other (Comment) (Pending psych disposition )  Graciella Freer [place] 06/30/2014 4:20 PM

## 2014-06-30 NOTE — BH Assessment (Signed)
Per, Ivan [name] patient reports that the patient needs to be seen by the incoming extender at 8pm for a final disposition.

## 2014-06-30 NOTE — ED Notes (Signed)
Updated Ivan [name], DSS liason for law enforcement, that pt has arrived. [name] said to contact her if she can assist in finding help for the pt.

## 2014-07-01 ENCOUNTER — Encounter (HOSPITAL_COMMUNITY): Payer: Self-pay | Admitting: Behavioral Health

## 2014-07-01 ENCOUNTER — Inpatient Hospital Stay (HOSPITAL_COMMUNITY)
Admission: AD | Admit: 2014-07-01 | Discharge: 2014-07-06 | DRG: 885 | Disposition: A | Discharge status: Home / Self Care | Payer: Medicare Other | Source: Intra-hospital | Attending: Emergency Medicine | Admitting: Emergency Medicine

## 2014-07-01 DIAGNOSIS — Z9119 Patient's noncompliance with other medical treatment and regimen: Secondary | ICD-10-CM | POA: Diagnosis present

## 2014-07-01 DIAGNOSIS — E0821 Diabetes mellitus due to underlying condition with diabetic nephropathy: Secondary | ICD-10-CM | POA: Insufficient documentation

## 2014-07-01 DIAGNOSIS — H11421 Conjunctival edema, right eye: Secondary | ICD-10-CM

## 2014-07-01 DIAGNOSIS — E1121 Type 2 diabetes mellitus with diabetic nephropathy: Secondary | ICD-10-CM | POA: Diagnosis present

## 2014-07-01 DIAGNOSIS — H578 Other specified disorders of eye and adnexa: Secondary | ICD-10-CM | POA: Diagnosis present

## 2014-07-01 DIAGNOSIS — G894 Chronic pain syndrome: Secondary | ICD-10-CM | POA: Diagnosis present

## 2014-07-01 DIAGNOSIS — S0501XA Injury of conjunctiva and corneal abrasion without foreign body, right eye, initial encounter: Secondary | ICD-10-CM

## 2014-07-01 DIAGNOSIS — E559 Vitamin D deficiency, unspecified: Secondary | ICD-10-CM | POA: Diagnosis present

## 2014-07-01 DIAGNOSIS — F17210 Nicotine dependence, cigarettes, uncomplicated: Secondary | ICD-10-CM | POA: Diagnosis present

## 2014-07-01 DIAGNOSIS — F201 Disorganized schizophrenia: Secondary | ICD-10-CM | POA: Diagnosis not present

## 2014-07-01 DIAGNOSIS — S0590XA Unspecified injury of unspecified eye and orbit, initial encounter: Secondary | ICD-10-CM | POA: Insufficient documentation

## 2014-07-01 DIAGNOSIS — N183 Chronic kidney disease, stage 3 unspecified: Secondary | ICD-10-CM | POA: Insufficient documentation

## 2014-07-01 DIAGNOSIS — F29 Unspecified psychosis not due to a substance or known physiological condition: Secondary | ICD-10-CM | POA: Diagnosis present

## 2014-07-01 DIAGNOSIS — I10 Essential (primary) hypertension: Secondary | ICD-10-CM | POA: Insufficient documentation

## 2014-07-01 DIAGNOSIS — F3170 Bipolar disorder, currently in remission, most recent episode unspecified: Secondary | ICD-10-CM | POA: Diagnosis not present

## 2014-07-01 DIAGNOSIS — R600 Localized edema: Secondary | ICD-10-CM | POA: Insufficient documentation

## 2014-07-01 DIAGNOSIS — I129 Hypertensive chronic kidney disease with stage 1 through stage 4 chronic kidney disease, or unspecified chronic kidney disease: Secondary | ICD-10-CM | POA: Diagnosis present

## 2014-07-01 DIAGNOSIS — F200 Paranoid schizophrenia: Secondary | ICD-10-CM

## 2014-07-01 DIAGNOSIS — F319 Bipolar disorder, unspecified: Secondary | ICD-10-CM

## 2014-07-01 DIAGNOSIS — R531 Weakness: Secondary | ICD-10-CM | POA: Diagnosis present

## 2014-07-01 DIAGNOSIS — Z9114 Patient's other noncompliance with medication regimen: Secondary | ICD-10-CM | POA: Diagnosis present

## 2014-07-01 LAB — CBG MONITORING, ED
GLUCOSE-CAPILLARY: 125 mg/dL — AB (ref 70–99)
Glucose-Capillary: 125 mg/dL — ABNORMAL HIGH (ref 70–99)

## 2014-07-01 MED ORDER — ALUM & MAG HYDROXIDE-SIMETH 200-200-20 MG/5ML PO SUSP: 30.0000 mL | ORAL | Status: DC | PRN

## 2014-07-01 MED ORDER — ACETAMINOPHEN 325 MG PO TABS
650.0000 mg | ORAL_TABLET | Freq: Four times a day (QID) | ORAL | Status: DC | PRN
  Administered 2014-07-01 – 2014-07-06 (×10): 650 mg via ORAL
  Filled 2014-07-01 (×9): qty 2

## 2014-07-01 MED ORDER — OLANZAPINE 10 MG PO TBDP
10.0000 mg | ORAL_TABLET | Freq: Three times a day (TID) | ORAL | Status: DC | PRN
  Administered 2014-07-02: 10 mg via ORAL
  Filled 2014-07-01: qty 1

## 2014-07-01 MED ORDER — TRAZODONE HCL 50 MG PO TABS
50.0000 mg | ORAL_TABLET | Freq: Every evening | ORAL | Status: DC | PRN
  Administered 2014-07-03 – 2014-07-04 (×2): 50 mg via ORAL
  Filled 2014-07-01 (×2): qty 1

## 2014-07-01 MED ORDER — MAGNESIUM HYDROXIDE 400 MG/5ML PO SUSP: 30.0000 mL | Freq: Every day | ORAL | Status: DC | PRN

## 2014-07-01 NOTE — ED Provider Notes (Signed)
Patient accepted to [facility] by [facility] NP.  BP 157/90 mmHg  Pulse 66  Temp(Src) 97.9 F (36.6 C) (Oral)  Resp 16  Ht 6\' 3"  (1.905 m)  Wt 260 lb (117.935 kg)  BMI 32.50 kg/m2  SpO2 100%   Ezequiel Essex, MD 07/01/14 1001

## 2014-07-01 NOTE — Progress Notes (Signed)
Pt has been assessed and meets inpatient criteria for treatment. Pt has been accepted at [facility], Ronnette Juniper, awaiting discharges. Pt is IVC'd.   Charlene Brooke, MSW  Social Worker [phone]

## 2014-07-01 NOTE — ED Notes (Signed)
telepsych being completed.

## 2014-07-01 NOTE — Consult Note (Signed)
Telepsych Consultation   Reason for Consult:  IVC'd by family for non compliance and walking around with 2 hammers in his hands aggressively Referring Physician:  EDP Ivan [name] is an 53 y.o. male.  Assessment: AXIS I:  Chronic paranoid schizophrenia, Bipolar (likely manic) Med non-compliant AXIS II:  Deferred AXIS [name]:   Past Medical History  Diagnosis Date  . Diabetes mellitus   . Hypertension   . Bipolar 1 disorder   . Schizoaffective disorder   . Hypokalemia 04/02/2013  . Edema of both legs 04/07/2013  . Paranoid schizophrenia    AXIS IV:  other psychosocial or environmental problems AXIS V:  21-30 behavior considerably influenced by delusions or hallucinations OR serious impairment in judgment, communication OR inability to function in almost all areas  Plan:  Recommend psychiatric Inpatient admission when medically cleared.  Subjective:   Ivan [name] is a 53 y.o. male patient admitted with bizarre behaviors, agitation, paranoia, and refuses to cooperate with ED providers,and was reportedly walking around confused and agitated with 2 hammers in his hands (police observed this). Pt is known to [facility] and has presented inpatient without incident in the past. Pt reports that he has been noncompliant with his medications. Pt's reality testing is not intact and he is not oriented to place or time, only to self. Pt has pressured speech, is tangential, and may be experiencing a manic episode due to abrupt cessation of medications. Pt will come inpatient at [facility] for stabilization. His answers are nonsensical regarding the assessment and he is a poor historian.   HPI:  FROM EDP NOTES: Ivan [name] is a 53 y.o. male with a history of DM who presents to the Emergency Department and reports recent family problems that have "gone too far this time". Pt refuses to discuss what is going on at this time. Per IVC paperwork "pt has been off his medications and was found wandering with two  hammers in hand and confused". Pt non-verbal. They also state he has been knocking on the neighbor's doors and acting aggressively. They state he has been walking around with 2 hammers. Pt reports a history of smoking. He says he does not drink very often. He lives by himself and says he is on disability. He states he is not physically sick. Pt reports no symptoms or modifying factors at this time. HPI Elements:   Location:  [facility]. Quality:  Chronic. Severity:  severe. Timing:  on going. Duration:  years. Context:  patient is paranoid, refuses to cooperate, with history of multiple hospital admissions for the same.  Past Psychiatric History: Past Medical History  Diagnosis Date  . Diabetes mellitus   . Hypertension   . Bipolar 1 disorder   . Schizoaffective disorder   . Hypokalemia 04/02/2013  . Edema of both legs 04/07/2013  . Paranoid schizophrenia     reports that he has been smoking Cigarettes and Cigars.  He has a 19.5 pack-year smoking history. He has never used smokeless tobacco. He reports that he drinks about 1.2 oz of alcohol per week. He reports that he does not use illicit drugs. No family history on file. Family History Substance Abuse: No Family Supports: Yes, List: Living Arrangements: Alone Can pt return to current living arrangement?: Yes Allergies:   Allergies  Allergen Reactions  . Bee Venom Swelling    ACT Assessment Complete:  Yes:    Educational Status    Risk to Self: Risk to self with the past 6 months Suicidal  Ideation: No Suicidal Intent: No Is patient at risk for suicide?: No Suicidal Plan?: No Access to Means: No What has been your use of drugs/alcohol within the last 12 months?: None Reported Previous Attempts/Gestures: No How many times?: 0 Other Self Harm Risks: NA Triggers for Past Attempts: None known Intentional Self Injurious Behavior:  (NA) Family Suicide History:  (None Reported) Recent stressful life event(s): Other (Comment) (Family  problems) Persecutory voices/beliefs?: No Depression: No Depression Symptoms:  (Patient denies any depression ) Substance abuse history and/or treatment for substance abuse?: No Suicide prevention information given to non-admitted patients: Not applicable  Risk to Others: Risk to Others within the past 6 months Homicidal Ideation: No Thoughts of Harm to Others: No Comment - Thoughts of Harm to Others: None Reported Current Homicidal Intent: No Current Homicidal Plan: No Access to Homicidal Means: No Identified Victim: None Reported History of harm to others?: No Assessment of Violence: None Noted Violent Behavior Description: NA Does patient have access to weapons?: No Criminal Charges Pending?: No Describe Pending Criminal Charges: NA Does patient have a court date: No Court Date:  (NA)  Abuse:    Prior Inpatient Therapy: Prior Inpatient Therapy Prior Inpatient Therapy: Yes Prior Therapy Dates: 05-19-2014 Prior Therapy Facilty/Provider(s): [facility] Reason for Treatment: psychosis  Prior Outpatient Therapy: Prior Outpatient Therapy Prior Outpatient Therapy: Yes Prior Therapy Dates: 2014 Prior Therapy Facilty/Provider(s): Dr. Casimiro Needle, [facility] Reason for Treatment: Med mgnt  Additional Information: Additional Information 1:1 In Past 12 Months?: No CIRT Risk: No Elopement Risk: No Does patient have medical clearance?: Yes                  Objective: Blood pressure 157/90, pulse 66, temperature 97.9 F (36.6 C), temperature source Oral, resp. rate 16, height 6' 3" (1.905 m), weight 117.935 kg (260 lb), SpO2 100 %.Body mass index is 32.5 kg/(m^2). Results for orders placed or performed during the hospital encounter of 06/30/14 (from the past 72 hour(s))  Comprehensive metabolic panel     Status: Abnormal   Collection Time: 06/30/14 11:17 AM  Result Value Ref Range   Sodium 143 137 - 147 mEq/L   Potassium 3.3 (L) 3.7 - 5.3 mEq/L   Chloride 103 96 - 112 mEq/L   CO2  27 19 - 32 mEq/L   Glucose, Bld 101 (H) 70 - 99 mg/dL   BUN 22 6 - 23 mg/dL   Creatinine, Ser 1.73 (H) 0.50 - 1.35 mg/dL   Calcium 9.3 8.4 - 10.5 mg/dL   Total Protein 7.4 6.0 - 8.3 g/dL   Albumin 3.1 (L) 3.5 - 5.2 g/dL   AST 65 (H) 0 - 37 U/L   ALT 39 0 - 53 U/L   Alkaline Phosphatase 115 39 - 117 U/L   Total Bilirubin 0.3 0.3 - 1.2 mg/dL   GFR calc non Af Amer 44 (L) >90 mL/min   GFR calc Af Amer 51 (L) >90 mL/min    Comment: (NOTE) The eGFR has been calculated using the CKD EPI equation. This calculation has not been validated in all clinical situations. eGFR's persistently <90 mL/min signify possible Chronic Kidney Disease.    Anion gap 13 5 - 15  CBC with Differential     Status: Abnormal   Collection Time: 06/30/14 11:17 AM  Result Value Ref Range   WBC 5.8 4.0 - 10.5 K/uL   RBC 3.86 (L) 4.22 - 5.81 MIL/uL   Hemoglobin 11.3 (L) 13.0 - 17.0 g/dL   HCT 34.0 (L) 39.0 -  52.0 %   MCV 88.1 78.0 - 100.0 fL   MCH 29.3 26.0 - 34.0 pg   MCHC 33.2 30.0 - 36.0 g/dL   RDW 15.1 11.5 - 15.5 %   Platelets 364 150 - 400 K/uL   Neutrophils Relative % 55 43 - 77 %   Neutro Abs 3.2 1.7 - 7.7 K/uL   Lymphocytes Relative 31 12 - 46 %   Lymphs Abs 1.8 0.7 - 4.0 K/uL   Monocytes Relative 10 3 - 12 %   Monocytes Absolute 0.6 0.1 - 1.0 K/uL   Eosinophils Relative 3 0 - 5 %   Eosinophils Absolute 0.2 0.0 - 0.7 K/uL   Basophils Relative 1 0 - 1 %   Basophils Absolute 0.0 0.0 - 0.1 K/uL  Ethanol     Status: None   Collection Time: 06/30/14 11:17 AM  Result Value Ref Range   Alcohol, Ethyl (B) <11 0 - 11 mg/dL    Comment:        LOWEST DETECTABLE LIMIT FOR SERUM ALCOHOL IS 11 mg/dL FOR MEDICAL PURPOSES ONLY   Urinalysis, Routine w reflex microscopic     Status: Abnormal   Collection Time: 06/30/14  2:11 PM  Result Value Ref Range   Color, Urine YELLOW YELLOW   APPearance CLEAR CLEAR   Specific Gravity, Urine >1.030 (H) 1.005 - 1.030   pH 6.0 5.0 - 8.0   Glucose, UA NEGATIVE NEGATIVE  mg/dL   Hgb urine dipstick MODERATE (A) NEGATIVE   Bilirubin Urine NEGATIVE NEGATIVE   Ketones, ur NEGATIVE NEGATIVE mg/dL   Protein, ur >300 (A) NEGATIVE mg/dL   Urobilinogen, UA 0.2 0.0 - 1.0 mg/dL   Nitrite NEGATIVE NEGATIVE   Leukocytes, UA NEGATIVE NEGATIVE  Urine rapid drug screen (hosp performed)     Status: None   Collection Time: 06/30/14  2:11 PM  Result Value Ref Range   Opiates NONE DETECTED NONE DETECTED   Cocaine NONE DETECTED NONE DETECTED   Benzodiazepines NONE DETECTED NONE DETECTED   Amphetamines NONE DETECTED NONE DETECTED   Tetrahydrocannabinol NONE DETECTED NONE DETECTED   Barbiturates NONE DETECTED NONE DETECTED    Comment:        DRUG SCREEN FOR MEDICAL PURPOSES ONLY.  IF CONFIRMATION IS NEEDED FOR ANY PURPOSE, NOTIFY LAB WITHIN 5 DAYS.        LOWEST DETECTABLE LIMITS FOR URINE DRUG SCREEN Drug Class       Cutoff (ng/mL) Amphetamine      1000 Barbiturate      200 Benzodiazepine   498 Tricyclics       264 Opiates          300 Cocaine          300 THC              50   Urine microscopic-add on     Status: None   Collection Time: 06/30/14  2:11 PM  Result Value Ref Range   WBC, UA 0-2 <3 WBC/hpf   RBC / HPF 0-2 <3 RBC/hpf  CBG monitoring, ED     Status: Abnormal   Collection Time: 06/30/14  5:15 PM  Result Value Ref Range   Glucose-Capillary 129 (H) 70 - 99 mg/dL  CBG monitoring, ED     Status: Abnormal   Collection Time: 07/01/14  4:24 AM  Result Value Ref Range   Glucose-Capillary 125 (H) 70 - 99 mg/dL  CBG monitoring, ED     Status: Abnormal   Collection Time:  07/01/14  7:25 AM  Result Value Ref Range   Glucose-Capillary 125 (H) 70 - 99 mg/dL   Labs are reviewed and are pertinent for . Low K, declining renal function, likely anemia of chronic disease,elevated glucose, tox screen negative  Current Facility-Administered Medications  Medication Dose Route Frequency Provider Last Rate Last Dose  . amLODipine (NORVASC) tablet 5 mg  5 mg Oral  BID Francine Graven, DO   5 mg at 05/19/14 0818  . aspirin EC tablet 81 mg  81 mg Oral Daily Francine Graven, DO      . benztropine (COGENTIN) tablet 1 mg  1 mg Oral BID Francine Graven, DO      . cloNIDine (CATAPRES) tablet 0.1 mg  0.1 mg Oral BID Francine Graven, DO   0.1 mg at 05/19/14 0120  . furosemide (LASIX) tablet 40 mg  40 mg Oral BID Francine Graven, DO   40 mg at 05/19/14 0818  . gabapentin (NEURONTIN) capsule 400 mg  400 mg Oral TID Francine Graven, DO      . glipiZIDE (GLUCOTROL) tablet 5 mg  5 mg Oral QAC breakfast Francine Graven, DO      . haloperidol (HALDOL) tablet 10 mg  10 mg Oral QPM Francine Graven, DO      . haloperidol (HALDOL) tablet 5 mg  5 mg Oral Daily Francine Graven, DO      . hydrALAZINE (APRESOLINE) tablet 100 mg  100 mg Oral 3 times per day Francine Graven, DO      . labetalol (NORMODYNE) tablet 200 mg  200 mg Oral BID Francine Graven, DO   200 mg at 05/19/14 0120  . lamoTRIgine (LAMICTAL) tablet 50 mg  50 mg Oral QPM Francine Graven, DO      . potassium chloride (K-DUR) CR tablet 10 mEq  10 mEq Oral BID Francine Graven, DO      . traZODone (DESYREL) tablet 100 mg  100 mg Oral QHS Francine Graven, DO      . Vitamin D (Ergocalciferol) (DRISDOL) capsule 50,000 Units  50,000 Units Oral Q21 days Francine Graven, DO       Current Outpatient Prescriptions  Medication Sig Dispense Refill  . amLODipine (NORVASC) 5 MG tablet Take 1 tablet (5 mg total) by mouth 2 (two) times daily.      Marland Kitchen aspirin EC 81 MG tablet Take 1 tablet (81 mg total) by mouth daily.      . benztropine (COGENTIN) 1 MG tablet Take 1 tablet (1 mg total) by mouth 2 (two) times daily.  60 tablet  0  . cloNIDine (CATAPRES) 0.1 MG tablet Take 1 tablet (0.1 mg total) by mouth 2 (two) times daily.  60 tablet  11  . furosemide (LASIX) 40 MG tablet Take 1 tablet (40 mg total) by mouth 2 (two) times daily.  30 tablet    . gabapentin (NEURONTIN) 400 MG capsule Take 1 capsule (400 mg total) by  mouth 3 (three) times daily.  90 capsule  0  . glipiZIDE (GLUCOTROL) 5 MG tablet Take 1 tablet (5 mg total) by mouth daily before breakfast.      . haloperidol (HALDOL) 10 MG tablet Take 1 tablet (10 mg total) by mouth every evening.  30 tablet  0  . haloperidol (HALDOL) 5 MG tablet Take 1 tablet (5 mg total) by mouth daily.  30 tablet  0  . hydrALAZINE (APRESOLINE) 100 MG tablet Take 1 tablet (100 mg total) by mouth 3 (three) times daily.  90 tablet  0  . labetalol (NORMODYNE) 200 MG tablet Take 1 tablet (200 mg total) by mouth 2 (two) times daily.      Marland Kitchen lamoTRIgine (LAMICTAL) 25 MG tablet Take 2 tablets (50 mg total) by mouth every evening.  60 tablet  0  . potassium chloride (K-DUR) 10 MEQ tablet Take 1 tablet (10 mEq total) by mouth 2 (two) times daily.      . traZODone (DESYREL) 100 MG tablet Take 1 tablet (100 mg total) by mouth at bedtime.  30 tablet  0  . Vitamin D, Ergocalciferol, (DRISDOL) 50000 UNITS CAPS capsule Take 1 capsule (50,000 Units total) by mouth every 21 ( twenty-one) days.  30 capsule      Psychiatric Specialty Exam:  Patient refuses to cooperate     Blood pressure 157/90, pulse 66, temperature 97.9 F (36.6 C), temperature source Oral, resp. rate 16, height 6' 3" (1.905 m), weight 117.935 kg (260 lb), SpO2 100 %.Body mass index is 32.5 kg/(m^2).  General Appearance:  Scrubs ED hospital bec  Eye Contact::  none  Speech:  clear  Volume:  normal  Mood:  irritable  Affect:  flat  Thought Process:  Tangential, Rumination about police and doctors  Orientation:         X self  Thought Content:  Rumination about police  Suicidal Thoughts:  UTO  Homicidal Thoughts:  UTO  Memory:  UTO  Judgement:  poor  Insight:  lacking  Psychomotor Activity:   Increased, agitated/fidgety  Concentration:   Poor  Recall:     UTO  Akathisia:       UTO  Handed:    AIMS (if indicated):     Assets:  Resliience  Sleep:      Treatment Plan Summary: -Admit to Inpatient at [facility]. If bed  not available, refer out for stabilization of Bipolar and Schizophrenia  Benjamine Mola, FNP-BC 9:34 AM 07/01/2014

## 2014-07-01 NOTE — ED Notes (Signed)
Per [facility], pt will be re-assessed at 830.

## 2014-07-01 NOTE — ED Notes (Signed)
telepsych monitor at bedside. 

## 2014-07-01 NOTE — ED Notes (Signed)
Per [facility], pt has been accepted to [facility] but does not have a bed available at this time. [facility] reports are expecting discharges this afternoon. Pt and [employer] aware. [facility] also wanted IVC paperwork to be faxed to [phone].

## 2014-07-01 NOTE — Progress Notes (Signed)
Admission Note  D: Patient admitted to [facility] from [facility] ED. Patient presents with depressed affect and mood. Writer observed that the patient appeared very drowsy on admission. Per report he was found confused, walking the streets with two hammers.  A: Support and encouragement provided to patient. Oriented patient to the unit and informed him of the rules/policies of the hospital. Initiated Q15 minute checks for safety.   R: Patient receptive. Denies SI/HI/AVH. Patient remains safe on the unit.

## 2014-07-01 NOTE — ED Notes (Signed)
[name], case worker, recommending [facility]. [name] informed that pt has re-assessment this am regarding disposition and that no note has been placed regarding that encounter. [name] reported would contact [facility] with recommendations.

## 2014-07-01 NOTE — Progress Notes (Signed)
Pt accepted to [facility], room 500-1,accepting Catalina Pizza, NP. Forestine Na RN made. Pt is IVC'd.  Charlene Brooke, MSW  Social Worker [phone]

## 2014-07-01 NOTE — ED Notes (Signed)
Pt requesting to take a shower. Pt escorted by security and [employer] officer to shower. Clean linen provided.

## 2014-07-01 NOTE — Tx Team (Signed)
Initial Interdisciplinary Treatment Plan   PATIENT STRESSORS: Health problems Medication change or noncompliance   PATIENT STRENGTHS: Ability for insight General fund of knowledge Motivation for treatment/growth   PROBLEM LIST: Problem List/Patient Goals Date to be addressed Date deferred Reason deferred Estimated date of resolution  Psychosis 07/01/14                                                      DISCHARGE CRITERIA:  Ability to meet basic life and health needs Improved stabilization in mood, thinking, and/or behavior Motivation to continue treatment in a less acute level of care  PRELIMINARY DISCHARGE PLAN: Attend aftercare/continuing care group Return to previous living arrangement  PATIENT/FAMIILY INVOLVEMENT: This treatment plan has been presented to and reviewed with the patient, Ivan [name].  The patient and family have been given the opportunity to ask questions and make suggestions.  Eduard Roux E 07/01/2014, 6:07 PM

## 2014-07-01 NOTE — Progress Notes (Signed)
D: Pt has flat affect and depressed mood.  Pt denies SI/HI, denies hallucinations.  Pt reports his day was "pretty fair."  Pt has minimal interactions with peers and staff and has stayed in his room for the majority of the evening.   A: Met with pt and provided support and encouragement.  PRN medication administered for pain, see flowsheet.  Safety maintained.   R: Pt verbally contracts for safety and is in no distress.  Denies needs and concerns and reports he will notify staff if any needs/concerns arise.  Will continue to monitor and assess for safety.

## 2014-07-02 ENCOUNTER — Encounter (HOSPITAL_COMMUNITY): Payer: Self-pay | Admitting: Emergency Medicine

## 2014-07-02 DIAGNOSIS — F209 Schizophrenia, unspecified: Secondary | ICD-10-CM

## 2014-07-02 MED ORDER — LINAGLIPTIN 5 MG PO TABS
5.0000 mg | ORAL_TABLET | Freq: Every day | ORAL | Status: DC
  Administered 2014-07-02 – 2014-07-05 (×4): 5 mg via ORAL
  Filled 2014-07-02 (×10): qty 1

## 2014-07-02 MED ORDER — NICOTINE POLACRILEX 2 MG MT GUM
CHEWING_GUM | OROMUCOSAL | Status: AC
  Filled 2014-07-02: qty 1

## 2014-07-02 MED ORDER — FLUORESCEIN SODIUM 1 MG OP STRP
1.0000 | ORAL_STRIP | Freq: Once | OPHTHALMIC | Status: AC
  Administered 2014-07-02: 1 via OPHTHALMIC
  Filled 2014-07-02: qty 1

## 2014-07-02 MED ORDER — ACETAMINOPHEN 325 MG PO TABS
650.0000 mg | ORAL_TABLET | Freq: Once | ORAL | Status: AC
  Administered 2014-07-02: 650 mg via ORAL

## 2014-07-02 MED ORDER — POTASSIUM CHLORIDE CRYS ER 20 MEQ PO TBCR
20.0000 meq | EXTENDED_RELEASE_TABLET | Freq: Two times a day (BID) | ORAL | Status: DC
  Administered 2014-07-02 – 2014-07-06 (×8): 20 meq via ORAL
  Filled 2014-07-02 (×17): qty 1

## 2014-07-02 MED ORDER — HYDRALAZINE HCL 50 MG PO TABS
100.0000 mg | ORAL_TABLET | Freq: Three times a day (TID) | ORAL | Status: DC
  Administered 2014-07-02 – 2014-07-06 (×12): 100 mg via ORAL
  Filled 2014-07-02 (×2): qty 2
  Filled 2014-07-02: qty 4
  Filled 2014-07-02 (×21): qty 2
  Filled 2014-07-02: qty 4
  Filled 2014-07-02 (×2): qty 2

## 2014-07-02 MED ORDER — NON FORMULARY: 100.0000 mg | Freq: Every day | Status: DC

## 2014-07-02 MED ORDER — HALOPERIDOL 5 MG PO TABS
10.0000 mg | ORAL_TABLET | Freq: Every day | ORAL | Status: DC
  Administered 2014-07-02 – 2014-07-03 (×2): 10 mg via ORAL
  Filled 2014-07-02 (×5): qty 2

## 2014-07-02 MED ORDER — ASPIRIN EC 81 MG PO TBEC
81.0000 mg | DELAYED_RELEASE_TABLET | Freq: Every day | ORAL | Status: DC
  Administered 2014-07-02 – 2014-07-05 (×4): 81 mg via ORAL
  Filled 2014-07-02 (×10): qty 1

## 2014-07-02 MED ORDER — LAMOTRIGINE 100 MG PO TABS
100.0000 mg | ORAL_TABLET | Freq: Every day | ORAL | Status: DC
  Administered 2014-07-02 – 2014-07-05 (×4): 100 mg via ORAL
  Filled 2014-07-02 (×5): qty 1
  Filled 2014-07-02: qty 3
  Filled 2014-07-02 (×4): qty 1

## 2014-07-02 MED ORDER — ERYTHROMYCIN 5 MG/GM OP OINT
TOPICAL_OINTMENT | Freq: Once | OPHTHALMIC | Status: AC
  Administered 2014-07-02: 16:00:00 via OPHTHALMIC
  Filled 2014-07-02: qty 3.5

## 2014-07-02 MED ORDER — GABAPENTIN 400 MG PO CAPS
400.0000 mg | ORAL_CAPSULE | Freq: Three times a day (TID) | ORAL | Status: DC
  Administered 2014-07-02 – 2014-07-06 (×12): 400 mg via ORAL
  Filled 2014-07-02 (×7): qty 1
  Filled 2014-07-02: qty 9
  Filled 2014-07-02 (×3): qty 1
  Filled 2014-07-02: qty 9
  Filled 2014-07-02 (×8): qty 1
  Filled 2014-07-02: qty 9
  Filled 2014-07-02 (×4): qty 1

## 2014-07-02 MED ORDER — NICOTINE POLACRILEX 2 MG MT GUM
2.0000 mg | CHEWING_GUM | OROMUCOSAL | Status: DC | PRN
  Administered 2014-07-02 – 2014-07-06 (×5): 2 mg via ORAL
  Filled 2014-07-02 (×5): qty 1

## 2014-07-02 MED ORDER — AMLODIPINE BESYLATE 5 MG PO TABS
5.0000 mg | ORAL_TABLET | Freq: Two times a day (BID) | ORAL | Status: DC
  Administered 2014-07-02 – 2014-07-05 (×7): 5 mg via ORAL
  Filled 2014-07-02 (×18): qty 1

## 2014-07-02 MED ORDER — BENZTROPINE MESYLATE 0.5 MG PO TABS
0.5000 mg | ORAL_TABLET | Freq: Every day | ORAL | Status: DC
  Administered 2014-07-02 – 2014-07-05 (×4): 0.5 mg via ORAL
  Filled 2014-07-02 (×4): qty 1
  Filled 2014-07-02: qty 3
  Filled 2014-07-02 (×6): qty 1

## 2014-07-02 MED ORDER — FUROSEMIDE 40 MG PO TABS
40.0000 mg | ORAL_TABLET | Freq: Two times a day (BID) | ORAL | Status: DC
  Administered 2014-07-02 – 2014-07-06 (×8): 40 mg via ORAL
  Filled 2014-07-02 (×17): qty 1

## 2014-07-02 MED ORDER — TETRACAINE HCL 0.5 % OP SOLN
2.0000 [drp] | Freq: Once | OPHTHALMIC | Status: AC
  Administered 2014-07-02: 2 [drp] via OPHTHALMIC
  Filled 2014-07-02: qty 2

## 2014-07-02 MED ORDER — LABETALOL HCL 200 MG PO TABS
200.0000 mg | ORAL_TABLET | Freq: Two times a day (BID) | ORAL | Status: DC
  Administered 2014-07-02 – 2014-07-05 (×7): 200 mg via ORAL
  Filled 2014-07-02 (×17): qty 1

## 2014-07-02 NOTE — ED Notes (Signed)
Pt alert, nad, resp even unlabored, skin pwd, denies needs, ambulates to discharge
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
## 2014-07-02 NOTE — Plan of Care (Signed)
Problem: Ineffective individual coping Goal: STG: Patient will remain free from self harm Outcome: Progressing Pt has not harmed himself this shift.  He verbally contracted for safety.

## 2014-07-02 NOTE — ED Notes (Signed)
Contacted [facility] transportation, dispatcher states unit is in service, will arrives soon.

## 2014-07-02 NOTE — BHH Counselor (Signed)
Adult Psychosocial Assessment Update Interdisciplinary Team  Previous [facility] admissions/discharges:  Admissions Discharges  Date: 10/1 Date:  Date: 8/25 Date:  Date: 8/14 Date:  Date: 5/14 Date:  Date: Date:   Changes since the last Psychosocial Assessment (including adherence to outpatient mental health and/or substance abuse treatment, situational issues contributing to decompensation and/or relapse). Not surprisingly, Ivan [name] was non-compliant with meds and outpt appointments post d/c.  We found out last time she was here that she had just been appointed guardian, and she sent appropriate paperwork documenting that.  Ivan [name] presents with paranoia. as with previous admissions.           Discharge Plan 1. Will you be returning to the same living situation after discharge?   Yes:X  Home No:      If no, what is your plan?           2. Would you like a referral for services when you are discharged? Yes:     If yes, for what services?  No:       We are coordinating with [facility] to get Ivan [name] a CST.  If able to get long acting injection here, will also petition court for out pt commitment.       Summary and Recommendations (to be completed by the evaluator) Ivan [name] is a 53 YO AA male with limited insight who is here for the fifth time in past 18 mos.  He has been consistently non-compliant with psychotropic meds post d/c from the hospital.  He has an enmeshed relationship with his family, which generally exacerbates his paranoia.  He can benefit from crises stabilization, medication management, therapeutic milieu and referral for services.                       Signature:  Trish Mage, 07/02/2014 1:12 PM
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
## 2014-07-02 NOTE — Progress Notes (Signed)
[facility] Group Notes:  (Nursing/MHT/Case Management/Adjunct)  Date:  07/02/2014  Time:  10:17 PM  Type of Therapy:  Psychoeducational Skills  Participation Level:  Minimal  Participation Quality:  Resistant  Affect:  Flat  Cognitive:  Lacking  Insight:  Lacking  Engagement in Group:  Limited  Modes of Intervention:  Education  Summary of Progress/Problems: The patient expressed in group this evening that he didn't have a very good day since he felt "sore and achy" for much of the day. The patient then verbalized that he attended only one other group today. In terms of the theme for the day, his coping skill is to "remain calm" and to try to take things one day at a time.   Ivan [name] S 07/02/2014, 10:17 PM

## 2014-07-02 NOTE — BHH Suicide Risk Assessment (Signed)
   Nursing information obtained from:    Demographic factors:    Current Mental Status:    Loss Factors:    Historical Factors:    Risk Reduction Factors:    Total Time spent with patient: 30 minutes  CLINICAL FACTORS:   Unstable or Poor Therapeutic Relationship Previous Psychiatric Diagnoses and Treatments Medical Diagnoses and Treatments/Surgeries  Psychiatric Specialty Exam: Physical Exam Please see H&P.   ROS  Blood pressure 135/73, pulse 84, temperature 98 F (36.7 C), temperature source Oral, resp. rate 16, height 6\' 3"  (1.905 m), weight 124.286 kg (274 lb), SpO2 99 %.Body mass index is 34.25 kg/(m^2).  Please see H&P for MSE   SUICIDE RISK:   Minimal: No identifiable suicidal ideation.    PLAN OF CARE:Please see H&P.   I certify that inpatient services furnished can reasonably be expected to improve the patient's condition.  Akiem Urieta md 07/02/2014, 3:49 PM

## 2014-07-02 NOTE — BHH Group Notes (Signed)
[facility] LCSW Group Therapy  07/02/2014 1:26 PM  Type of Therapy:  Group Therapy  Participation Level: Invited-  Did Not Attend  Summary of Progress/Problems: Today's Topic: Hope-Group members were asked to explore the concept of [place]. Group members were challenged to identify what hope means to them, reflect on past experiences with hope, and explore what makes them feel hopeful/regain a sense of hope.   Smart, Janila Arrazola LCSWA 07/02/2014, 1:26 PM

## 2014-07-02 NOTE — BHH Group Notes (Signed)
[facility] LCSW Aftercare Discharge Planning Group Note   07/02/2014 10:03 AM  Participation Quality:  Minimal   Mood/Affect:  Flat  Depression Rating:  0  Anxiety Rating:  0  Thoughts of Suicide:  No Will you contract for safety?   NA  Current AVH:  Denies  Plan for Discharge/Comments:  CSW is assessing. He reports he slept very little last night.  Transportation Means: Bus.  Supports: family   Hyatt,Candace
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
## 2014-07-02 NOTE — Progress Notes (Signed)
Inpatient Diabetes Program Recommendations  AACE/ADA: New Consensus Statement on Inpatient Glycemic Control (2013)  Target Ranges:  Prepandial:   less than 140 mg/dL      Peak postprandial:   less than 180 mg/dL (1-2 hours)      Critically ill patients:  140 - 180 mg/dL   Reason for Assessment: Diabetes Consult  Diabetes history: DM2 Outpatient Diabetes medications: Januvia 100 mcg QD and glipizide 5 mg QD Current orders for Inpatient glycemic control: tradjenta 5 mg QD  Inpatient Diabetes Program Recommendations Correction (SSI): If CBGs >180 mg/dL, consider addition of Novolog sensitive tidwc HgbA1C: 6.3% - good glycemic control   Thank you. Lorenda Peck, RD, LDN, CDE Inpatient Diabetes Coordinator [phone]

## 2014-07-03 LAB — GLUCOSE, CAPILLARY
GLUCOSE-CAPILLARY: 171 mg/dL — AB (ref 70–99)
GLUCOSE-CAPILLARY: 161 mg/dL — AB (ref 70–99)
Glucose-Capillary: 155 mg/dL — ABNORMAL HIGH (ref 70–99)

## 2014-07-03 MED ORDER — ERYTHROMYCIN 5 MG/GM OP OINT
TOPICAL_OINTMENT | Freq: Three times a day (TID) | OPHTHALMIC | Status: DC
  Administered 2014-07-03 – 2014-07-06 (×6): via OPHTHALMIC
  Filled 2014-07-03 (×2): qty 3.5

## 2014-07-03 MED ORDER — ZIPRASIDONE HCL 40 MG PO CAPS: 40.0000 mg | ORAL_CAPSULE | Freq: Two times a day (BID) | ORAL | Status: DC

## 2014-07-03 MED ORDER — IBUPROFEN 600 MG PO TABS
600.0000 mg | ORAL_TABLET | Freq: Four times a day (QID) | ORAL | Status: DC | PRN
  Administered 2014-07-04 – 2014-07-05 (×3): 600 mg via ORAL
  Filled 2014-07-03 (×3): qty 1

## 2014-07-03 MED ORDER — HALOPERIDOL 5 MG PO TABS
10.0000 mg | ORAL_TABLET | Freq: Every day | ORAL | Status: DC
  Administered 2014-07-04 – 2014-07-06 (×3): 10 mg via ORAL
  Filled 2014-07-03 (×6): qty 2
  Filled 2014-07-03: qty 6

## 2014-07-03 NOTE — BHH Group Notes (Signed)
[facility] LCSW Group Therapy  07/03/2014 3:37 PM  Type of Therapy:  Group Therapy  Participation Level:  Did Not Attend  Participation Quality:  N/A  Affect:  N/A  Cognitive:  N/A  Insight:  N/A  Engagement in Therapy:  N/A  Modes of Intervention:  Discussion, Education, Exploration, Rapport Building and Support  Summary of Progress/Problems: Pt did not attend  Katha Hamming 07/03/2014, 3:37 PM

## 2014-07-03 NOTE — Progress Notes (Signed)
Did not attended group 

## 2014-07-03 NOTE — BHH Group Notes (Signed)
[facility] Group Notes:  (Nursing/MHT/Case Management/Adjunct)  Date:  07/03/2014  Time:  6:26 PM  Type of Therapy:  Psychoeducational Skills---self inventory with rn  Participation Level:    Participation Quality:  Drowsy  Affect:  Depressed  Cognitive:  Lacking  Insight:  Lacking  Engagement in Group:  Lacking and Limited  Modes of Intervention:  Discussion, Education and Exploration  Summary of Progress/Problems: pt slept through half of group.  Leonia Reader 07/03/2014, 6:26 PM
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
## 2014-07-04 DIAGNOSIS — Z9114 Patient's other noncompliance with medication regimen: Secondary | ICD-10-CM

## 2014-07-04 DIAGNOSIS — G8929 Other chronic pain: Secondary | ICD-10-CM

## 2014-07-04 DIAGNOSIS — R609 Edema, unspecified: Secondary | ICD-10-CM

## 2014-07-04 DIAGNOSIS — E118 Type 2 diabetes mellitus with unspecified complications: Secondary | ICD-10-CM

## 2014-07-04 DIAGNOSIS — F29 Unspecified psychosis not due to a substance or known physiological condition: Secondary | ICD-10-CM | POA: Insufficient documentation

## 2014-07-04 DIAGNOSIS — M549 Dorsalgia, unspecified: Secondary | ICD-10-CM

## 2014-07-04 DIAGNOSIS — I10 Essential (primary) hypertension: Secondary | ICD-10-CM

## 2014-07-04 DIAGNOSIS — F23 Brief psychotic disorder: Secondary | ICD-10-CM

## 2014-07-04 LAB — GLUCOSE, CAPILLARY
Glucose-Capillary: 127 mg/dL — ABNORMAL HIGH (ref 70–99)
Glucose-Capillary: 154 mg/dL — ABNORMAL HIGH (ref 70–99)
Glucose-Capillary: 136 mg/dL — ABNORMAL HIGH (ref 70–99)
Glucose-Capillary: 126 mg/dL — ABNORMAL HIGH (ref 70–99)

## 2014-07-04 MED ORDER — NICOTINE POLACRILEX 2 MG MT GUM
CHEWING_GUM | OROMUCOSAL | Status: AC
  Filled 2014-07-04: qty 1
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
## 2014-07-04 NOTE — Progress Notes (Signed)
D: Pt denies SI/HI/AVH. Pt is pleasant and cooperative. Pt stated he is suppose to leave tomorrow, so he can get his eye looked at. Pt stated he was feeling ok today, not so hot earlier, but he was feeling better this evening.   A: Pt was offered support and encouragement. Pt was given scheduled medications. Pt was encourage to attend groups. Q 15 minute checks were done for safety.   R:Pt attends groups and interacts well with peers and staff. Pt is taking medication. Pt has no complaints at this time.Pt receptive to treatment and safety maintained on unit.

## 2014-07-04 NOTE — Progress Notes (Signed)
Patient ID: Ivan [name], male   DOB: 1961/02/11, 53 y.o.   MRN: [id number] [facility] MD Progress Note  07/04/2014 7:35 PM Ivan [name]  MRN:  [id number] Subjective: Patient reports: "I feel like I'm getting better. I think I'm ready to go home tomorrow".   Objective: Patient seen and chart reviewed. Pt appears to be doing much better today. He is calm, cooperative, and appropriate. Pt reports that the medications are helping and that he is also doing better regarding his eye. His eye is still visibly swollen but vision is intact and PERRLA bilaterally. Pt will likely be stable for discharge in the next 24-48 hours based on his current presentation. He is denying SI, HI, AVH, and reality testing is intact. No remaining signs of psychosis are present. Pt feels that his acute psychosis was secondary to medication noncompliance.   HPI: Ivan [name] is a 53 y.o. AA male with a history of Diabetes Mellitus,Hypertension ,Chronic back pain ,Pedal edema ,Hx of Elevated LFTs due to Depakote ,CKD Stage [name] as well as schizophrenia who presented to the Emergency Department IVC ed . IVC petition stated that "pt has been off his medications and was found wandering with two hammers in hand and confused".Per petition it also stated that he has been knocking on the neighbor's doors and acting aggressively. It also stated that he has been walking around with 2 hammers.   Diagnosis:   DSM5: Primary psychiatric diagnosis:  Schizophrenia,multiple episodes,currently in acute episode   Secondary psychiatric diagnosis:  Non compliance with medical treatment / Medications.   Non psychiatric diagnosis:  Diabetes Mellitus  Hypertension  Chronic back pain  Pedal edema          Total Time spent with patient: 25 minutes   Past Medical History  Diagnosis Date  . Diabetes mellitus   . Hypertension   . Bipolar 1 disorder   . Schizoaffective disorder   . Hypokalemia 04/02/2013  . Edema of both  legs 04/07/2013  . Paranoid schizophrenia     ADL's:  Intact  Sleep: Good  Appetite:  Good  :  Psychiatric Specialty Exam: Physical Exam  Eyes:  Right eye is very swollen (abx ordered)    Review of Systems  Constitutional: Negative.   HENT: Negative.   Eyes: Positive for blurred vision (right eye, cleared by ED), pain (right eye) and redness (right eye).  Respiratory: Negative.   Cardiovascular: Negative.   Gastrointestinal: Negative.   Genitourinary: Negative.   Musculoskeletal: Negative.   Skin: Negative.   Neurological: Negative.   Endo/Heme/Allergies: Negative.   Psychiatric/Behavioral: Negative.     Blood pressure 110/75, pulse 105, temperature 98.9 F (37.2 C), temperature source Oral, resp. rate 18, height 6\' 3"  (1.905 m), weight 124.286 kg (274 lb), SpO2 99 %.Body mass index is 34.25 kg/(m^2).  General Appearance: Fairly Groomed  Engineer, water::  Good  Speech:  Clear and Coherent and Normal Rate  Volume:  Normal  Mood:  Euthymic  Affect:  Constricted  Thought Process:  Coherent and Goal Directed  Orientation:  Full (Time, Place, and Person)  Thought Content:  WNL  Suicidal Thoughts:  No  Homicidal Thoughts:  No  Memory:  Immediate;   Fair Recent;   Fair Remote;   Fair  Judgement:  Impaired  Insight:  Lacking  Psychomotor Activity:  Normal  Concentration:  Fair  Recall:  [place]  Language: Good  Akathisia:  No  Handed:  Right  AIMS (if indicated):  Assets:  Communication Skills Desire for Improvement Physical Health  Sleep:  Number of Hours: 4.5   Musculoskeletal: Strength & Muscle Tone: within normal limits Gait & Station: normal Patient leans: Backward  Current Medications: Current Facility-Administered Medications  Medication Dose Route Frequency Provider Last Rate Last Dose  . acetaminophen (TYLENOL) tablet 650 mg  650 mg Oral Q6H PRN Mojeed Akintayo   650 mg at 07/04/14 1156  . alum & mag hydroxide-simeth  (MAALOX/MYLANTA) 200-200-20 MG/5ML suspension 30 mL  30 mL Oral Q4H PRN Mojeed Akintayo      . amLODipine (NORVASC) tablet 5 mg  5 mg Oral BID Ursula Alert, MD   5 mg at 07/04/14 1810  . aspirin EC tablet 81 mg  81 mg Oral Daily Saramma Eappen, MD   81 mg at 07/04/14 0854  . benztropine (COGENTIN) tablet 0.5 mg  0.5 mg Oral Daily Ursula Alert, MD   0.5 mg at 07/04/14 0854  . erythromycin ophthalmic ointment   Right Eye TID Benjamine Mola, FNP      . furosemide (LASIX) tablet 40 mg  40 mg Oral BID Ursula Alert, MD   40 mg at 07/04/14 1731  . gabapentin (NEURONTIN) capsule 400 mg  400 mg Oral TID Ursula Alert, MD   400 mg at 07/04/14 1810  . haloperidol (HALDOL) tablet 10 mg  10 mg Oral Daily Benjamine Mola, FNP   10 mg at 07/04/14 0853  . hydrALAZINE (APRESOLINE) tablet 100 mg  100 mg Oral 3 times per day Ursula Alert, MD   100 mg at 07/04/14 1810  . ibuprofen (ADVIL,MOTRIN) tablet 600 mg  600 mg Oral Q6H PRN Benjamine Mola, FNP      . labetalol (NORMODYNE) tablet 200 mg  200 mg Oral BID Ursula Alert, MD   200 mg at 07/04/14 1810  . lamoTRIgine (LAMICTAL) tablet 100 mg  100 mg Oral Daily Ursula Alert, MD   100 mg at 07/04/14 0854  . linagliptin (TRADJENTA) tablet 5 mg  5 mg Oral Daily Ursula Alert, MD   5 mg at 07/04/14 0854  . magnesium hydroxide (MILK OF MAGNESIA) suspension 30 mL  30 mL Oral Daily PRN Mojeed Akintayo      . nicotine polacrilex (NICORETTE) 2 MG gum           . nicotine polacrilex (NICORETTE) gum 2 mg  2 mg Oral PRN Ursula Alert, MD   2 mg at 07/03/14 2115  . OLANZapine zydis (ZYPREXA) disintegrating tablet 10 mg  10 mg Oral Q8H PRN Mojeed Akintayo   10 mg at 07/02/14 2147  . potassium chloride SA (K-DUR,KLOR-CON) CR tablet 20 mEq  20 mEq Oral BID Ursula Alert, MD   20 mEq at 07/04/14 1810  . traZODone (DESYREL) tablet 50 mg  50 mg Oral QHS PRN Mojeed Akintayo   50 mg at 07/03/14 2109    Lab Results:  Results for orders placed or performed during the  hospital encounter of 07/01/14 (from the past 48 hour(s))  Glucose, capillary     Status: Abnormal   Collection Time: 07/02/14 10:08 PM  Result Value Ref Range   Glucose-Capillary 171 (H) 70 - 99 mg/dL  Glucose, capillary     Status: Abnormal   Collection Time: 07/03/14  4:55 PM  Result Value Ref Range   Glucose-Capillary 155 (H) 70 - 99 mg/dL  Glucose, capillary     Status: Abnormal   Collection Time: 07/03/14  9:54 PM  Result Value Ref Range  Glucose-Capillary 161 (H) 70 - 99 mg/dL  Glucose, capillary     Status: Abnormal   Collection Time: 07/04/14  6:26 AM  Result Value Ref Range   Glucose-Capillary 127 (H) 70 - 99 mg/dL  Glucose, capillary     Status: Abnormal   Collection Time: 07/04/14 11:38 AM  Result Value Ref Range   Glucose-Capillary 154 (H) 70 - 99 mg/dL   Comment 1 Notify RN   Glucose, capillary     Status: Abnormal   Collection Time: 07/04/14  4:56 PM  Result Value Ref Range   Glucose-Capillary 136 (H) 70 - 99 mg/dL   Comment 1 Notify RN     Physical Findings: AIMS: Facial and Oral Movements Muscles of Facial Expression: None, normal Lips and Perioral Area: None, normal Jaw: None, normal Tongue: None, normal,Extremity Movements Upper (arms, wrists, hands, fingers): None, normal Lower (legs, knees, ankles, toes): None, normal, Trunk Movements Neck, shoulders, hips: None, normal, Overall Severity Severity of abnormal movements (highest score from questions above): None, normal Incapacitation due to abnormal movements: None, normal Patient's awareness of abnormal movements (rate only patient's report): No Awareness, Dental Status Current problems with teeth and/or dentures?: Yes Does patient usually wear dentures?: No  CIWA:    COWS:     Treatment Plan Summary: Daily contact with patient to assess and evaluate symptoms and progress in treatment Medication management  Plan: Review of chart, vital signs, medications, and notes.  1-Individual and group  therapy  2-Medication management for depression and anxiety: Medications reviewed with the patient and she stated no untoward effects, unchanged. 3-Coping skills for depression, anxiety  4-Continue crisis stabilization and management  5-Address health issues--monitoring vital signs, stable  6-Treatment plan in progress to prevent relapse of depression and anxiety  Medical Decision Making Problem Points:  Established problem, stable/improving (1), Review of last therapy session (1) and Review of psycho-social stressors (1) Data Points:  Order Aims Assessment (2) Review of medication regiment & side effects (2) Review of new medications or change in dosage (2)  I certify that inpatient services furnished can reasonably be expected to improve the patient's condition.   Benjamine Mola, FNP-BC 07/04/2014, 3:35 PM

## 2014-07-04 NOTE — BHH Group Notes (Signed)
[place] LCSW Group Therapy  07/04/2014   11:00 AM   Type of Therapy:  Group Therapy  Participation Level:  Minimal  Participation Quality:  Disengaged, inattentive  Affect:  Drowsy, depressed  Cognitive:  Drowsy  Insight:  Limited  Engagement in Therapy:  Disengaged  Modes of Intervention:  Clarification, Confrontation, Discussion, Education, Exploration, Limit-setting, Orientation, Problem-solving, Rapport Building, Art therapist, Socialization and Support  Summary of Progress/Problems: Today's group topic was avoiding self sabotage and enabling behaviors. Group members were asked to define self sabotage and enabling and provide examples. Group members were then asked to discuss unhealthy relationships and how to have positive healthy boundaries with those that enable. Group members were asked to process how communicating needs and establishing a plan to change the above identified behavior.  Pt slept through group and did not participate in group discussion.   Regan Lemming, LCSW 07/04/2014 11:46 AM

## 2014-07-04 NOTE — Progress Notes (Signed)
Patient ID: Ivan [name], male   DOB: Oct 11, 1960, 53 y.o.   MRN: [id number] D- Patient alert and oriented.  Flat affect with a depressed mood. Denies SI, HI, and AVH. Complaint of right eye pain rated 8/10. Patient given medication per MD order for pain (See MAR). Patient also has a complaint of vision disturbances due to swollen right eye. A- Support and encouragement provided. Routine safety checks conducted every 15 minutes.  Patient informed to notify staff with problems or concerns. R- Patient contracts for safety at this time. Patient calm and cooperative.

## 2014-07-04 NOTE — Progress Notes (Signed)
Psychoeducational Group Note  Date:  07/04/2014 Time: 1015 Group Topic/Focus:  Making Healthy Choices:   The focus of this group is to help patients identify negative/unhealthy choices they were using prior to admission and identify positive/healthier coping strategies to replace them upon discharge.  Participation Level:  Active  Participation Quality:  Appropriate  Affect:  Flat  Cognitive:  Alert  Insight:  Engaged  Engagement in Group:  Engaged  Additional Comments:    Lauralyn Primes 5:40 PM. 07/04/2014

## 2014-07-05 DIAGNOSIS — R600 Localized edema: Secondary | ICD-10-CM

## 2014-07-05 DIAGNOSIS — N183 Chronic kidney disease, stage 3 (moderate): Secondary | ICD-10-CM

## 2014-07-05 DIAGNOSIS — G894 Chronic pain syndrome: Secondary | ICD-10-CM

## 2014-07-05 DIAGNOSIS — E0821 Diabetes mellitus due to underlying condition with diabetic nephropathy: Secondary | ICD-10-CM

## 2014-07-05 DIAGNOSIS — S0591XA Unspecified injury of right eye and orbit, initial encounter: Secondary | ICD-10-CM

## 2014-07-05 DIAGNOSIS — F201 Disorganized schizophrenia: Principal | ICD-10-CM

## 2014-07-05 LAB — GLUCOSE, CAPILLARY
GLUCOSE-CAPILLARY: 131 mg/dL — AB (ref 70–99)
Glucose-Capillary: 121 mg/dL — ABNORMAL HIGH (ref 70–99)
Glucose-Capillary: 127 mg/dL — ABNORMAL HIGH (ref 70–99)
Glucose-Capillary: 149 mg/dL — ABNORMAL HIGH (ref 70–99)
Glucose-Capillary: 126 mg/dL — ABNORMAL HIGH (ref 70–99)

## 2014-07-05 MED ORDER — TRAZODONE HCL 100 MG PO TABS
100.0000 mg | ORAL_TABLET | Freq: Every day | ORAL | Status: DC
Start: 2014-07-05 — End: 2014-07-06
  Administered 2014-07-05: 100 mg via ORAL
  Filled 2014-07-05 (×2): qty 1
  Filled 2014-07-05: qty 3
  Filled 2014-07-05: qty 1

## 2014-07-05 NOTE — Progress Notes (Signed)
Patient ID: Ivan [name], male   DOB: 12-20-1960, 53 y.o.   MRN: [id number]   D: Pt. Denies SI/HI and A/V Hallucinations to this Probation officer. Patient reported a headache this morning upon reassessment patient denied pain. At 0907 this morning patient was in day room, patient reported that he was trying to avoid stepping on another patient's feet and lost his footing and fell to his knees. Patient's vitals were taken and CBG obtained, these were WDL. Patient denied any pain, tenderness or discomfort. Patient was able to do required ROM exercises. Patient reports that his right eye is draining at times but other than that his eye is fine. Patient only wanted his eye cream once today.  A: Support and encouragement provided to the patient. Patient was reeducated on fall safety and verbalized understanding. Patient was given socks that fit his feet better and a fall risk wrist band. Scheduled medications administered to patient. Vitals are obtained every 4 hours and patient is observed to make sure he is safe. Patient appears in no distress at this time.  R: Patient is receptive and cooperative with Probation officer and pleasant in interaction. Patient is seen in the milieu and is attending groups. Q15 minute checks are maintained for safety.

## 2014-07-05 NOTE — Progress Notes (Signed)
Adult Psychoeducational Group Note  Date:  07/05/2014 Time:  10:35 PM  Group Topic/Focus:  Wrap-Up Group:   The focus of this group is to help patients review their daily goal of treatment and discuss progress on daily workbooks.  Participation Level:  Minimal  Participation Quality:  Appropriate  Affect:  Flat  Cognitive:  Oriented  Insight: Limited  Engagement in Group:  Limited  Modes of Intervention:  Socialization and Support  Additional Comments:  Patient attended and participated in group tonight. He reports that he did not have much energy today. He fell on to his knees today. He is feeling better now. Today her did went down for his meals and attended his groups.  Salley Scarlet [facility] 07/05/2014, 10:35 PM

## 2014-07-05 NOTE — BHH Group Notes (Signed)
 [facility] LCSW Aftercare Discharge Planning Group Note   07/05/2014 11:40 AM  Participation Quality:  Minimal  Mood/Affect:  Flat  Depression Rating:  denies  Anxiety Rating:  denies  Thoughts of Suicide:  No Will you contract for safety?   NA  Current AVH:  Denies, but makes some paranoid statements  Plan for Discharge/Comments:  Ivan [name] has no complaints.  He admitted he fell this AM, but he is unhurt.  He is ready to d/c.  Per usual, denies everything and insists that he needs nothing.  Transportation Means: sheriff  Supports: family  [name], Barbaraann Rondo B

## 2014-07-05 NOTE — Progress Notes (Signed)
Pt up and awake at this time and has been so for the past 20 minutes. Pt had been up and down throughout the evening, getting up to make multiple stops to the water fountain, insisted he was ok and did not request anything.

## 2014-07-05 NOTE — Progress Notes (Signed)
Patient ID: Ivan [name], male   DOB: 08-22-1960, 53 y.o.   MRN: [id number] [facility] MD Progress Note  07/05/2014 12:51 PM [name]  MRN:  [id number] Subjective: Patient reports: "I am OK ,I know how to take care of my medications,I know what I am doing ,I do not need any more help."  Objective: Patient seen and chart reviewed. Pt appears to be calm and is compliant on medications. However when it was discussed with pt that since he has had several recent admissions to the inpatient unit ,he may benefit from an ACTT and if not possibly ALF,pt became very hostile and reported that he does not need any kind of help and that he knows to take his medications and that is what he has been doing. Pts sister is his legal guardian ,so pt to be referred for ACTT ,home health RN for managing his many medical issues (if he does not qualify for ACTT 2/2 his insurance) as well outpatient commitment to follow up with his provider for medications management once discharged. Pt denies SI/AH/VH/HI ,however continues to be withdrawn ,irritable at times. Pt also has had poor sleep last night.  Pt has not complained about his eyes (rt). He does have swelling of his eyelids on examination,however denies any burning visual problems.   Diagnosis:   DSM5: Primary psychiatric diagnosis:  Schizophrenia,multiple episodes,currently in acute episode   Secondary psychiatric diagnosis:  Non compliance with medical treatment / Medications.   Non psychiatric diagnosis:  S/P trauma to eyes (rt) Diabetes Mellitus  Hypertension  Chronic back pain  Pedal edema          Total Time spent with patient:30 minutes   Past Medical History  Diagnosis Date  . Diabetes mellitus   . Hypertension   . Bipolar 1 disorder   . Schizoaffective disorder   . Hypokalemia 04/02/2013  . Edema of both legs 04/07/2013  . Paranoid schizophrenia     ADL's:  Intact  Sleep: Poor  Appetite:  Good  :  Psychiatric Specialty  Exam: Physical Exam  Eyes:  Right eye is very swollen (abx ordered)    Review of Systems  Constitutional: Negative.   HENT: Negative.   Eyes: Positive for redness (right eye). Blurred vision: right eye, cleared by ED. Eye pain: right eye.       Swollen  Respiratory: Negative.   Cardiovascular: Negative.   Gastrointestinal: Negative.   Genitourinary: Negative.   Musculoskeletal: Negative.   Skin: Negative.   Neurological: Negative.   Endo/Heme/Allergies: Negative.   Psychiatric/Behavioral: Negative.     Blood pressure 133/70, pulse 79, temperature 98.1 F (36.7 C), temperature source Oral, resp. rate 18, height 6\' 3"  (1.905 m), weight 124.286 kg (274 lb), SpO2 99 %.Body mass index is 34.25 kg/(m^2).  General Appearance: Fairly Groomed  Engineer, water::  Good  Speech:  Clear and Coherent and Normal Rate  Volume:  Normal  Mood:  Irritable  Affect:  Constricted  Thought Process:  Coherent and Goal Directed  Orientation:  Full (Time, Place, and Person)  Thought Content:  WNL  Suicidal Thoughts:  No  Homicidal Thoughts:  No  Memory:  Immediate;   Fair Recent;   Fair Remote;   Fair  Judgement:  Impaired  Insight:  Lacking  Psychomotor Activity:  Normal  Concentration:  Fair  Recall:  [place]  Language: Good  Akathisia:  No  Handed:  Right  AIMS (if indicated):     Assets:  Communication Skills Desire for Improvement Physical Health  Sleep:  Number of Hours: 2.75   Musculoskeletal: Strength & Muscle Tone: within normal limits Gait & Station: normal Patient leans: Backward  Current Medications: Current Facility-Administered Medications  Medication Dose Route Frequency Provider Last Rate Last Dose  . acetaminophen (TYLENOL) tablet 650 mg  650 mg Oral Q6H PRN Mojeed Akintayo   650 mg at 07/04/14 1156  . alum & mag hydroxide-simeth (MAALOX/MYLANTA) 200-200-20 MG/5ML suspension 30 mL  30 mL Oral Q4H PRN Mojeed Akintayo      . amLODipine (NORVASC)  tablet 5 mg  5 mg Oral BID Ursula Alert, MD   5 mg at 07/05/14 0821  . aspirin EC tablet 81 mg  81 mg Oral Daily Ursula Alert, MD   81 mg at 07/05/14 0821  . benztropine (COGENTIN) tablet 0.5 mg  0.5 mg Oral Daily Ursula Alert, MD   0.5 mg at 07/05/14 0821  . erythromycin ophthalmic ointment   Right Eye TID Benjamine Mola, FNP      . furosemide (LASIX) tablet 40 mg  40 mg Oral BID Ursula Alert, MD   40 mg at 07/05/14 0821  . gabapentin (NEURONTIN) capsule 400 mg  400 mg Oral TID Ursula Alert, MD   400 mg at 07/05/14 1208  . haloperidol (HALDOL) tablet 10 mg  10 mg Oral Daily Benjamine Mola, FNP   10 mg at 07/05/14 [id number]  . hydrALAZINE (APRESOLINE) tablet 100 mg  100 mg Oral 3 times per day Ursula Alert, MD   100 mg at 07/05/14 0549  . ibuprofen (ADVIL,MOTRIN) tablet 600 mg  600 mg Oral Q6H PRN Benjamine Mola, FNP   600 mg at 07/05/14 [id number]  . labetalol (NORMODYNE) tablet 200 mg  200 mg Oral BID Ursula Alert, MD   200 mg at 07/05/14 0821  . lamoTRIgine (LAMICTAL) tablet 100 mg  100 mg Oral Daily Ursula Alert, MD   100 mg at 07/05/14 0821  . linagliptin (TRADJENTA) tablet 5 mg  5 mg Oral Daily Ursula Alert, MD   5 mg at 07/05/14 [id number]  . magnesium hydroxide (MILK OF MAGNESIA) suspension 30 mL  30 mL Oral Daily PRN Mojeed Akintayo      . nicotine polacrilex (NICORETTE) gum 2 mg  2 mg Oral PRN Ursula Alert, MD   2 mg at 07/04/14 2005  . OLANZapine zydis (ZYPREXA) disintegrating tablet 10 mg  10 mg Oral Q8H PRN Mojeed Akintayo   10 mg at 07/02/14 2147  . potassium chloride SA (K-DUR,KLOR-CON) CR tablet 20 mEq  20 mEq Oral BID Ursula Alert, MD   20 mEq at 07/05/14 0821  . traZODone (DESYREL) tablet 100 mg  100 mg Oral QHS Ursula Alert, MD        Lab Results:  Results for orders placed or performed during the hospital encounter of 07/01/14 (from the past 48 hour(s))  Glucose, capillary     Status: Abnormal   Collection Time: 07/03/14  4:55 PM  Result Value Ref Range    Glucose-Capillary 155 (H) 70 - 99 mg/dL  Glucose, capillary     Status: Abnormal   Collection Time: 07/03/14  9:54 PM  Result Value Ref Range   Glucose-Capillary 161 (H) 70 - 99 mg/dL  Glucose, capillary     Status: Abnormal   Collection Time: 07/04/14  6:26 AM  Result Value Ref Range   Glucose-Capillary 127 (H) 70 - 99 mg/dL  Glucose, capillary     Status: Abnormal  Collection Time: 07/04/14 11:38 AM  Result Value Ref Range   Glucose-Capillary 154 (H) 70 - 99 mg/dL   Comment 1 Notify RN   Glucose, capillary     Status: Abnormal   Collection Time: 07/04/14  4:56 PM  Result Value Ref Range   Glucose-Capillary 136 (H) 70 - 99 mg/dL   Comment 1 Notify RN   Glucose, capillary     Status: Abnormal   Collection Time: 07/04/14  9:23 PM  Result Value Ref Range   Glucose-Capillary 126 (H) 70 - 99 mg/dL  Glucose, capillary     Status: Abnormal   Collection Time: 07/05/14  5:59 AM  Result Value Ref Range   Glucose-Capillary 121 (H) 70 - 99 mg/dL  Glucose, capillary     Status: Abnormal   Collection Time: 07/05/14  9:25 AM  Result Value Ref Range   Glucose-Capillary 131 (H) 70 - 99 mg/dL  Glucose, capillary     Status: Abnormal   Collection Time: 07/05/14 11:12 AM  Result Value Ref Range   Glucose-Capillary 127 (H) 70 - 99 mg/dL    Physical Findings: AIMS: Facial and Oral Movements Muscles of Facial Expression: None, normal Lips and Perioral Area: None, normal Jaw: None, normal Tongue: None, normal,Extremity Movements Upper (arms, wrists, hands, fingers): None, normal Lower (legs, knees, ankles, toes): None, normal, Trunk Movements Neck, shoulders, hips: None, normal, Overall Severity Severity of abnormal movements (highest score from questions above): None, normal Incapacitation due to abnormal movements: None, normal Patient's awareness of abnormal movements (rate only patient's report): No Awareness, Dental Status Current problems with teeth and/or dentures?: Yes Does  patient usually wear dentures?: No  CIWA:    COWS:     Treatment Plan Summary: Daily contact with patient to assess and evaluate symptoms and progress in treatment Medication management  Plan: Review of chart, vital signs, medications, and notes.  1-Individual and group therapy  2-Medication management for depression and anxiety: Medications reviewed with the patient and she stated no untoward effects. Will change his Trazodone to 100 mg po qhs to help with sleep.    Will continue Haldol 10 mg po daily as well as Cogentin as scheduled.Will continue Lamictal 100 mg po daily for mood lability.    Continue Erythromycin for eyes.    Will work on Outpatient commitment today.CSW will work on it.    Will refer pt for ACTT ,also will place Home health RN since pt has multiple medical issues as well as psychiatric issues and will benefit from medication management and supervision. 3-Coping skills for depression, anxiety  4-Continue crisis stabilization and management  5-Address health issues--monitoring vital signs, stable  6-Treatment plan in progress to prevent relapse of depression and anxiety  Medical Decision Making Problem Points:  Established problem, stable/improving (1), Review of last therapy session (1) and Review of psycho-social stressors (1) Data Points:  Order Aims Assessment (2) Review of medication regiment & side effects (2) Review of new medications or change in dosage (2)  I certify that inpatient services furnished can reasonably be expected to improve the patient's condition.   Cameryn Schum, MD 07/05/2014, 12:40PM

## 2014-07-05 NOTE — BHH Group Notes (Signed)
[place] LCSW Group Therapy  07/05/2014 3:35 PM  Type of Therapy:  Group Therapy  Participation Level: Invited- Did Not Attend  Summary of Progress/Problems: Today's Topic: Overcoming Obstacles. Group members identified obstacles faced currently and processed barriers involved in overcoming these obstacles. They were then asked to identify steps necessary for overcoming these obstacles and explored past obstacles that they had overcome.   [place], Riccardo Holeman LCSWA  07/05/2014, 3:35 PM

## 2014-07-06 DIAGNOSIS — S0590XA Unspecified injury of unspecified eye and orbit, initial encounter: Secondary | ICD-10-CM | POA: Insufficient documentation

## 2014-07-06 DIAGNOSIS — G894 Chronic pain syndrome: Secondary | ICD-10-CM | POA: Insufficient documentation

## 2014-07-06 DIAGNOSIS — I10 Essential (primary) hypertension: Secondary | ICD-10-CM | POA: Insufficient documentation

## 2014-07-06 DIAGNOSIS — N183 Chronic kidney disease, stage 3 unspecified: Secondary | ICD-10-CM | POA: Insufficient documentation

## 2014-07-06 DIAGNOSIS — R600 Localized edema: Secondary | ICD-10-CM | POA: Insufficient documentation

## 2014-07-06 DIAGNOSIS — F201 Disorganized schizophrenia: Secondary | ICD-10-CM | POA: Insufficient documentation

## 2014-07-06 DIAGNOSIS — E0821 Diabetes mellitus due to underlying condition with diabetic nephropathy: Secondary | ICD-10-CM | POA: Insufficient documentation

## 2014-07-06 LAB — BASIC METABOLIC PANEL
Anion gap: 14 (ref 5–15)
BUN: 32 mg/dL — ABNORMAL HIGH (ref 6–23)
CALCIUM: 9.6 mg/dL (ref 8.4–10.5)
CO2: 25 meq/L (ref 19–32)
CREATININE: 2.09 mg/dL — AB (ref 0.50–1.35)
Chloride: 101 mEq/L (ref 96–112)
GFR calc Af Amer: 41 mL/min — ABNORMAL LOW (ref >90)
GFR calc non Af Amer: 35 mL/min — ABNORMAL LOW (ref >90)
GLUCOSE: 157 mg/dL — AB (ref 70–99)
Potassium: 3.6 mEq/L — ABNORMAL LOW (ref 3.7–5.3)
Sodium: 140 mEq/L (ref 137–147)

## 2014-07-06 LAB — GLUCOSE, CAPILLARY
Glucose-Capillary: 129 mg/dL — ABNORMAL HIGH (ref 70–99)
Glucose-Capillary: 154 mg/dL — ABNORMAL HIGH (ref 70–99)

## 2014-07-06 LAB — CBC
HCT: 32.1 % — ABNORMAL LOW (ref 39.0–52.0)
HEMOGLOBIN: 10.4 g/dL — AB (ref 13.0–17.0)
MCH: 28.8 pg (ref 26.0–34.0)
MCHC: 32.4 g/dL (ref 30.0–36.0)
MCV: 88.9 fL (ref 78.0–100.0)
Platelets: 359 10*3/uL (ref 150–400)
RBC: 3.61 MIL/uL — ABNORMAL LOW (ref 4.22–5.81)
RDW: 15 % (ref 11.5–15.5)
WBC: 7.1 10*3/uL (ref 4.0–10.5)

## 2014-07-06 LAB — CBG MONITORING, ED: Glucose-Capillary: 165 mg/dL — ABNORMAL HIGH (ref 70–99)

## 2014-07-06 LAB — MAGNESIUM: Magnesium: 1.7 mg/dL (ref 1.5–2.5)

## 2014-07-06 MED ORDER — GABAPENTIN 400 MG PO CAPS: 400.0000 mg | ORAL_CAPSULE | Freq: Three times a day (TID) | ORAL | Status: DC

## 2014-07-06 MED ORDER — VITAMIN D (ERGOCALCIFEROL) 1.25 MG (50000 UNIT) PO CAPS: 50000.0000 [IU] | ORAL_CAPSULE | ORAL | Status: DC

## 2014-07-06 MED ORDER — POTASSIUM CHLORIDE ER 10 MEQ PO TBCR: 10.0000 meq | EXTENDED_RELEASE_TABLET | Freq: Two times a day (BID) | ORAL | Status: DC

## 2014-07-06 MED ORDER — SITAGLIPTIN PHOSPHATE 100 MG PO TABS: 100.0000 mg | ORAL_TABLET | Freq: Every day | ORAL | Status: DC

## 2014-07-06 MED ORDER — ASPIRIN EC 81 MG PO TBEC: 81.0000 mg | DELAYED_RELEASE_TABLET | Freq: Every day | ORAL | Status: DC

## 2014-07-06 MED ORDER — LABETALOL HCL 200 MG PO TABS: 200.0000 mg | ORAL_TABLET | Freq: Two times a day (BID) | ORAL | Status: DC

## 2014-07-06 MED ORDER — AMLODIPINE BESYLATE 5 MG PO TABS: 5.0000 mg | ORAL_TABLET | Freq: Two times a day (BID) | ORAL | Status: DC

## 2014-07-06 MED ORDER — CLONIDINE HCL 0.1 MG PO TABS: 0.1000 mg | ORAL_TABLET | Freq: Two times a day (BID) | ORAL | Status: DC

## 2014-07-06 MED ORDER — SODIUM CHLORIDE 0.9 % IV BOLUS (SEPSIS)
500.0000 mL | Freq: Once | INTRAVENOUS | Status: AC
  Administered 2014-07-06: 500 mL via INTRAVENOUS

## 2014-07-06 MED ORDER — HYDRALAZINE HCL 100 MG PO TABS: 100.0000 mg | ORAL_TABLET | Freq: Three times a day (TID) | ORAL | Status: DC

## 2014-07-06 MED ORDER — LAMOTRIGINE 100 MG PO TABS: 100.0000 mg | ORAL_TABLET | Freq: Every day | ORAL | Status: DC

## 2014-07-06 MED ORDER — GLIPIZIDE 5 MG PO TABS: 5.0000 mg | ORAL_TABLET | Freq: Every day | ORAL | Status: DC

## 2014-07-06 MED ORDER — HALOPERIDOL 10 MG PO TABS: 10.0000 mg | ORAL_TABLET | Freq: Every day | ORAL | Status: DC

## 2014-07-06 MED ORDER — TRAZODONE HCL 100 MG PO TABS: 100.0000 mg | ORAL_TABLET | Freq: Every day | ORAL | Status: DC

## 2014-07-06 MED ORDER — FUROSEMIDE 40 MG PO TABS: 40.0000 mg | ORAL_TABLET | Freq: Two times a day (BID) | ORAL | Status: DC

## 2014-07-06 MED ORDER — BENZTROPINE MESYLATE 0.5 MG PO TABS: 0.5000 mg | ORAL_TABLET | Freq: Every day | ORAL | Status: DC

## 2014-07-06 NOTE — ED Notes (Signed)
Attempted to call report to [facility] for transfer from the ED. Spoke with Vaughan Basta, she states report would need to be called after 7am due to not having assignment sheet made out and change of shift.

## 2014-07-06 NOTE — ED Notes (Signed)
Spoke with pharmacy about needing the standing order for HYDRALAZINE. Pharmacy reported they would send the medication to the ED.

## 2014-07-06 NOTE — ED Notes (Signed)
Cancel orthastatic vitals

## 2014-07-06 NOTE — ED Notes (Signed)
Bed: [id number] Expected date:  Expected time:  Means of arrival:  Comments: EMS/pain and weakness

## 2014-07-06 NOTE — Significant Event (Cosign Needed)
Called to patient bedside, Patient reportedly felt extremely weak as he was attempting to go to the bathroom he urinated on himself and rolled off the bed onto the floor. The patient is denying any head  or neck trauma. The patient denies any associated CP, SOB, Vertigo, N/V or lightheadedness sx. Patient felt weak earlier today with reported fall at 9 am. Per review of prior records, patients potassium was 3.3 on 11/11. There is no record of potassium replacement or checking of Magnesium levels. Patient is on Lasix 40 mg BID.  Gen: 53 y/o AAM, NAD, BG 129 mg/dl, HR 77 reg, Sat 95% RA, BP 131/65  Neuro: A & O x 3 spheres, following simple commands  Integument: W/D  Pulm: CTA  Cardio: audible S1,S1 without M/G/R  Abd: soft protuberant with normoactive BS  A/P: Generalized weakness suspect secondary to metabolic disarray/ hypokalemia  * Transport to [facility] for further eval, to include EKG, BMP, Mg  * Replete K and Mg prn   * [facility] MD on call

## 2014-07-06 NOTE — Tx Team (Signed)
  Interdisciplinary Treatment Plan Update   Date Reviewed:  07/06/2014  Time Reviewed:  1:40 PM  Progress in Treatment:   Attending groups: Yes Participating in groups: Yes Taking medication as prescribed: Yes  Tolerating medication: Yes Family/Significant other contact made: Yes  Patient understands diagnosis: Yes  Discussing patient identified problems/goals with staff: Yes  See initial care plan Medical problems stabilized or resolved: Yes Denies suicidal/homicidal ideation: Yes  In tx team Patient has not harmed self or others: Yes  For review of initial/current patient goals, please see plan of care.  Estimated Length of Stay:  D/C today  Reason for Continuation of Hospitalization:   New Problems/Goals identified:  N/A  Discharge Plan or Barriers:   return home, follow up outpt  Additional Comments:  Attendees:  Signature: Steva Colder, MD 07/06/2014 1:40 PM   Signature: Ripley Fraise, LCSW 07/06/2014 1:40 PM  Signature: Elmarie Shiley, NP 07/06/2014 1:40 PM  Signature: Gaylan Gerold, RN 07/06/2014 1:40 PM  Signature:  07/06/2014 1:40 PM  Signature:  07/06/2014 1:40 PM  Signature:   07/06/2014 1:40 PM  Signature:    Signature:    Signature:    Signature:    Signature:    Signature:      Scribe for Treatment Team:   [employer], LCSW  07/06/2014 1:40 PM

## 2014-07-06 NOTE — Progress Notes (Signed)
D: Pt was sleep before the fall. After fall pt was alert and oriented x3, no pain noted.  A:  Pt was assessed by PA on duty, (mini neuro check). Patriciaann Clan R: Pt sent to ER    Gen: 53 y/o AAM, NAD, BG 129 mg/dl, HR 77 reg, Sat 95% RA, BP 131/65  Neuro: A & O x 3 spheres, following simple commands  Integument: W/D  Pulm: CTA  Cardio: audible S1,S1 without M/G/R  Abd: soft protuberant with normoactive BS  A/P: Generalized weakness suspect secondary to metabolic disarray/ hypokalemia  * Transport to [facility] for further eval, to include EKG, BMP, Mg   * [facility] MD on call

## 2014-07-06 NOTE — ED Provider Notes (Signed)
CSN: [id number]     Arrival date & time 07/02/14  1338 History   First MD Initiated Contact with Patient 07/02/14 1435     Chief Complaint  Patient presents with  . Weakness  . Generalized Body Aches     (Consider location/radiation/quality/duration/timing/severity/associated sxs/prior Treatment) Patient is a 53 y.o. male presenting with weakness. The history is provided by the patient and medical records. No language interpreter was used.  Weakness This is a new problem. Associated symptoms include weakness. Pertinent negatives include no chills or fever. Associated symptoms comments: The patient is sent here from [facility] with concern for generalized weakness causing fall. He has been admitted to [facility] for treatment of schizophrenia and has, per patient, been undergoing medication changes to better control his symptoms. He reports that his weakness has been progressive for the past "week or so". Per reports from [facility], the patient rolled out of bed and onto the floor earlier this evening. There was also a report of a fall yesterday morning without injury. There was a concern about recent hypokalemia and he is sent here for further evaluation. The patient does not have any complaint of pain or injury from the falls. No nausea, vomiting, recent illness or fever. .    Past Medical History  Diagnosis Date  . Diabetes mellitus   . Hypertension   . Bipolar 1 disorder   . Schizoaffective disorder   . Hypokalemia 04/02/2013  . Edema of both legs 04/07/2013  . Paranoid schizophrenia    Past Surgical History  Procedure Laterality Date  . Breast surgery    . Fracture surgery      ribs and arm   History reviewed. No pertinent family history. History  Substance Use Topics  . Smoking status: Current Every Day Smoker -- 0.50 packs/day for 39 years    Types: Cigarettes, Cigars  . Smokeless tobacco: Never Used  . Alcohol Use: 1.2 oz/week    1 Glasses of wine, 1 Cans of beer per week     Comment: once  or twice a month    Review of Systems  Constitutional: Negative for fever and chills.  HENT: Negative.   Respiratory: Negative.   Cardiovascular: Negative.   Gastrointestinal: Negative.   Musculoskeletal: Negative.   Skin: Negative.   Neurological: Positive for weakness.      Allergies  Bee venom  Home Medications   Prior to Admission medications   Medication Sig Start Date End Date Taking? Authorizing Provider  amLODipine (NORVASC) 5 MG tablet Take 1 tablet (5 mg total) by mouth 2 (two) times daily. 05/25/14   Elmarie Shiley, NP  aspirin EC 81 MG tablet Take 1 tablet (81 mg total) by mouth daily. 05/25/14   Elmarie Shiley, NP  benztropine (COGENTIN) 0.5 MG tablet Take 1 tablet (0.5 mg total) by mouth daily. 05/25/14   Elmarie Shiley, NP  cloNIDine (CATAPRES) 0.1 MG tablet Take 1 tablet (0.1 mg total) by mouth 2 (two) times daily. 05/25/14   Elmarie Shiley, NP  furosemide (LASIX) 40 MG tablet Take 1 tablet (40 mg total) by mouth 2 (two) times daily. 05/25/14   Elmarie Shiley, NP  gabapentin (NEURONTIN) 400 MG capsule Take 1 capsule (400 mg total) by mouth 3 (three) times daily. 05/25/14   Elmarie Shiley, NP  glipiZIDE (GLUCOTROL) 5 MG tablet Take 1 tablet (5 mg total) by mouth daily before breakfast. 05/25/14   Elmarie Shiley, NP  haloperidol (HALDOL) 10 MG tablet Take 1 tablet (10 mg total) by mouth daily.  For mood control 05/25/14   Elmarie Shiley, NP  haloperidol (HALDOL) 5 MG tablet  06/01/14   Historical Provider, MD  hydrALAZINE (APRESOLINE) 100 MG tablet Take 1 tablet (100 mg total) by mouth 3 (three) times daily. 05/25/14   Elmarie Shiley, NP  labetalol (NORMODYNE) 200 MG tablet Take 1 tablet (200 mg total) by mouth 2 (two) times daily. 05/25/14   Elmarie Shiley, NP  lamoTRIgine (LAMICTAL) 100 MG tablet Take 1 tablet (100 mg total) by mouth daily. 05/25/14   Elmarie Shiley, NP  potassium chloride (K-DUR) 10 MEQ tablet Take 1 tablet (10 mEq total) by mouth 2 (two) times daily. 05/25/14   Elmarie Shiley, NP  sitaGLIPtin  (JANUVIA) 100 MG tablet Take 1 tablet (100 mg total) by mouth daily. 05/25/14   Elmarie Shiley, NP  traZODone (DESYREL) 100 MG tablet Take 1 tablet (100 mg total) by mouth at bedtime. 05/25/14   Elmarie Shiley, NP  Vitamin D, Ergocalciferol, (DRISDOL) 50000 UNITS CAPS capsule Take 1 capsule (50,000 Units total) by mouth every 21 ( twenty-one) days. 05/25/14   Elmarie Shiley, NP   BP 128/64 mmHg  Pulse 74  Temp(Src) 98.7 F (37.1 C) (Oral)  Resp 16  Ht 6\' 3"  (1.905 m)  Wt 274 lb (124.286 kg)  BMI 34.25 kg/m2  SpO2 94% Physical Exam  Constitutional: He is oriented to person, place, and time. He appears well-developed and well-nourished.  HENT:  Head: Normocephalic.  Neck: Normal range of motion. Neck supple.  Cardiovascular: Normal rate and regular rhythm.   Pulmonary/Chest: Effort normal and breath sounds normal.  Abdominal: Soft. Bowel sounds are normal. There is no tenderness. There is no rebound and no guarding.  Musculoskeletal: Normal range of motion.  Neurological: He is alert and oriented to person, place, and time.  Skin: Skin is warm and dry. No rash noted.  Psychiatric: He has a normal mood and affect.    ED Course  Procedures (including critical care time) Labs Review Labs Reviewed  GLUCOSE, CAPILLARY - Abnormal; Notable for the following:    Glucose-Capillary 171 (*)    All other components within normal limits  GLUCOSE, CAPILLARY - Abnormal; Notable for the following:    Glucose-Capillary 155 (*)    All other components within normal limits  GLUCOSE, CAPILLARY - Abnormal; Notable for the following:    Glucose-Capillary 161 (*)    All other components within normal limits  GLUCOSE, CAPILLARY - Abnormal; Notable for the following:    Glucose-Capillary 127 (*)    All other components within normal limits  GLUCOSE, CAPILLARY - Abnormal; Notable for the following:    Glucose-Capillary 154 (*)    All other components within normal limits  GLUCOSE, CAPILLARY - Abnormal; Notable  for the following:    Glucose-Capillary 136 (*)    All other components within normal limits  GLUCOSE, CAPILLARY - Abnormal; Notable for the following:    Glucose-Capillary 126 (*)    All other components within normal limits  GLUCOSE, CAPILLARY - Abnormal; Notable for the following:    Glucose-Capillary 121 (*)    All other components within normal limits  GLUCOSE, CAPILLARY - Abnormal; Notable for the following:    Glucose-Capillary 131 (*)    All other components within normal limits  GLUCOSE, CAPILLARY - Abnormal; Notable for the following:    Glucose-Capillary 127 (*)    All other components within normal limits  GLUCOSE, CAPILLARY - Abnormal; Notable for the following:    Glucose-Capillary 149 (*)  All other components within normal limits  GLUCOSE, CAPILLARY - Abnormal; Notable for the following:    Glucose-Capillary 126 (*)    All other components within normal limits  GLUCOSE, CAPILLARY - Abnormal; Notable for the following:    Glucose-Capillary 129 (*)    All other components within normal limits  CBC - Abnormal; Notable for the following:    RBC 3.61 (*)    Hemoglobin 10.4 (*)    HCT 32.1 (*)    All other components within normal limits  BASIC METABOLIC PANEL - Abnormal; Notable for the following:    Potassium 3.6 (*)    Glucose, Bld 157 (*)    BUN 32 (*)    Creatinine, Ser 2.09 (*)    GFR calc non Af Amer 35 (*)    GFR calc Af Amer 41 (*)    All other components within normal limits  CBG MONITORING, ED    Imaging Review No results found.   EKG Interpretation None      MDM   Final diagnoses:  Cornea abrasion, right, initial encounter  Chemosis of conjunctiva, right    1. Weakness  The patient stands multiple times without assistance to urinate without weakness. Ambulatory without difficulty. Lab studies without remarkable change. No significant hypomagnesemia or hypokalemia. VSS. Feel he can be returned to [facility] to continue treatment.     Dewaine Oats, PA-C 07/06/14 [id number]  Julianne Rice, MD 07/10/14 [phone]

## 2014-07-06 NOTE — ED Notes (Signed)
Pt comes in with dizziness and weakness. Pt is on lasix having difficulty standing a walking. Pt has fluid build up in legs which is normal for him. Facility reported pt potassium has been low.

## 2014-07-06 NOTE — Discharge Instructions (Signed)
07/06/14: weakness is resolved in ED. Lab studies reassuring. [facility] to continue treatment.   Please apply a ribbon of ointment to your right lower eyelid every 8 hrs for the next 5-7 days.  Take tylenol for pain as needed.  Follow up closely with eye specialist if you notice your vision is impaired, if you have increasing pain or if you have other concerns.  Corneal Abrasion The cornea is the clear covering at the front and center of the eye. When looking at the colored portion of the eye (iris), you are looking through the cornea. This very thin tissue is made up of many layers. The surface layer is a single layer of cells (corneal epithelium) and is one of the most sensitive tissues in the body. If a scratch or injury causes the corneal epithelium to come off, it is called a corneal abrasion. If the injury extends to the tissues below the epithelium, the condition is called a corneal ulcer. CAUSES   Scratches.  Trauma.  Foreign body in the eye. Some people have recurrences of abrasions in the area of the original injury even after it has healed (recurrent erosion syndrome). Recurrent erosion syndrome generally improves and goes away with time. SYMPTOMS   Eye pain.  Difficulty or inability to keep the injured eye open.  The eye becomes very sensitive to light.  Recurrent erosions tend to happen suddenly, first thing in the morning, usually after waking up and opening the eye. DIAGNOSIS  Your health care provider can diagnose a corneal abrasion during an eye exam. Dye is usually placed in the eye using a drop or a small paper strip moistened by your tears. When the eye is examined with a special light, the abrasion shows up clearly because of the dye. TREATMENT   Small abrasions may be treated with antibiotic drops or ointment alone.  A pressure patch may be put over the eye. If this is done, follow your doctor's instructions for when to remove the patch. Do  not drive or use machines while the eye patch is on. Judging distances is hard to do with a patch on. If the abrasion becomes infected and spreads to the deeper tissues of the cornea, a corneal ulcer can result. This is serious because it can cause corneal scarring. Corneal scars interfere with light passing through the cornea and cause a loss of vision in the involved eye. HOME CARE INSTRUCTIONS  Use medicine or ointment as directed. Only take over-the-counter or prescription medicines for pain, discomfort, or fever as directed by your health care provider.  Do not drive or operate machinery if your eye is patched. Your ability to judge distances is impaired.  If your health care provider has given you a follow-up appointment, it is very important to keep that appointment. Not keeping the appointment could result in a severe eye infection or permanent loss of vision. If there is any problem keeping the appointment, let your health care provider know. SEEK MEDICAL CARE IF:   You have pain, light sensitivity, and a scratchy feeling in one eye or both eyes.  Your pressure patch keeps loosening up, and you can blink your eye under the patch after treatment.  Any kind of discharge develops from the eye after treatment or if the lids stick together in the morning.  You have the same symptoms in the morning as you did with the original abrasion days, weeks, or months after the abrasion healed. MAKE SURE YOU:  Understand these instructions.  Will watch your condition.  Will get help right away if you are not doing well or get worse. Document Released: 08/03/2000 Document Revised: 08/11/2013 Document Reviewed: 04/13/2013 [facility] Patient Information 2015 [place], [place]. This information is not intended to replace advice given to you by your health care provider. Make sure you discuss any questions you have with your health care provider.
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
## 2014-07-06 NOTE — ED Notes (Signed)
Sitter provided patient graham crackers and water with permission from provider.

## 2014-07-06 NOTE — [facility] Suicide Risk Assessment (Signed)
[place] INPATIENT:  Family/Significant Other Suicide Prevention Education  Suicide Prevention Education:  Education Completed; No one has been identified by the patient as the family member/significant other with whom the patient will be residing, and identified as the person(s) who will aid the patient in the event of a mental health crisis (suicidal ideations/suicide attempt).  With written consent from the patient, the family member/significant other has been provided the following suicide prevention education, prior to the and/or following the discharge of the patient.  The suicide prevention education provided includes the following:  Suicide risk factors  Suicide prevention and interventions  National Suicide Hotline telephone number  [facility] assessment telephone number  [facility] Emergency Assistance [place] and/or Residential Mobile Crisis Unit telephone number  Request made of family/significant other to:  Remove weapons (e.g., guns, rifles, knives), all items previously/currently identified as safety concern.    Remove drugs/medications (over-the-counter, prescriptions, illicit drugs), all items previously/currently identified as a safety concern.  The family member/significant other verbalizes understanding of the suicide prevention education information provided.  The family member/significant other agrees to remove the items of safety concern listed above. The patient did not endorse SI at the time of admission, nor did the patient c/o SI during the stay here.  SPE not required.   Ivan [name] 07/06/2014, 2:05 PM

## 2014-07-06 NOTE — Progress Notes (Signed)
[facility] Adult Case Management Discharge Plan :  Will you be returning to the same living situation after discharge: Yes,  home At discharge, do you have transportation home?:Yes,  sheriff Do you have the ability to pay for your medications:Yes,  [facility]  Release of information consent forms completed and in the chart;  Patient's signature needed at discharge.  Patient to Follow up at: Follow-up Information    Follow up with Katy Apo, MD In 3 days.   Specialty:  Ophthalmology   Why:  As needed, If symptoms worsen with your eye   Contact information:   [place] [address] [phone]       Follow up with [facility] On 07/08/2014.   Why:  Go to the walk-in clinic on thursday between 7:45 and 11 AM for your hospital follow up appointment   Contact information:   [place] [phone]      Follow up with Dr Angelica Pou.   Contact information:   [place]  [phone]      Patient denies SI/HI:   Yes,  yes    Safety Planning and Suicide Prevention discussed:  Yes,  yes  [name] 07/06/2014, 2:04 PM

## 2014-07-06 NOTE — ED Notes (Signed)
Ambulated patient in the hallway with two staff members on either side. Patient was able to ambulate with holding on to either staff member. However, while in the room before ambulation he stumbled two times at bedside. Pt reports after lying down he started experiencing a headache. Now, patient is lying on his right side, appearing in no distress. Will notify provider of this information.

## 2014-07-06 NOTE — ED Notes (Signed)
Sitter provided a warm blanket.

## 2014-07-06 NOTE — ED Notes (Signed)
Called to patient bedside, Patient reportedly felt extremely weak as he was attempting to go to the bathroom he urinated on himself and rolled off the bed onto the floor. The patient is denying any head or neck trauma. The patient denies any associated CP, SOB, Vertigo, N/V or lightheadedness sx. Patient felt weak earlier today with reported fall at 9 am. Per review of prior records, patients potassium was 3.3 on 11/11. There is no record of potassium replacement or checking of Magnesium levels. Patient is on Lasix 40 mg BID.  Gen: 53 y/o AAM, NAD, BG 129 mg/dl, HR 77 reg, Sat 95% RA, BP 131/65  Neuro: A & O x 3 spheres, following simple commands  Integument: W/D  Pulm: CTA  Cardio: audible S1,S1 without M/G/R  Abd: soft protuberant with normoactive BS  A/P: Generalized weakness suspect secondary to metabolic disarray/ hypokalemia  * Transport to [facility] for further eval, to include EKG, BMP, Mg  * Replete K and Mg prn   * [facility] MD on call

## 2014-07-06 NOTE — Progress Notes (Signed)
Patient ID: Ivan [name], male   DOB: May 05, 1961, 53 y.o.   MRN: [id number]  Pt. Denies SI/HI and A/V hallucinations to this Probation officer. Belongings returned to patient at time of discharge. Patient denies any new onset of pain or discomfort. Discharge instructions and medications were reviewed with patient. Patient verbalized understanding of both medications and discharge instructions. Q15 minute safety checks. No distress noted upon discharge. Patient was picked up by [facility] for transfer after discharge.

## 2014-07-06 NOTE — Progress Notes (Signed)
Pt rolled out of bed at [place]. Pt complained of weakness. Pt stated he did not hit his head. Pt complained of weakness in his arms and legs that steadily got worse as the day went on. Pt was assessed by PA and pt transported to [facility].  Report called to ED to notify of Transport.   Per PA Frederico Hamman) :Patient reportedly felt extremely weak as he was attempting to go to the bathroom he urinated on himself and rolled off the bed onto the floor. The patient is denying any head or neck trauma. The patient denies any associated CP, SOB, Vertigo, N/V or lightheadedness sx. Patient felt weak earlier today with reported fall at 9 am. Per review of prior records, patients potassium was 3.3 on 11/11. There is no record of potassium replacement or checking of Magnesium levels. Patient is on Lasix 40 mg BID.
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
## 2014-07-06 NOTE — ED Notes (Signed)
Patient resting quietly with eyes closed. Appears in no respiratory distress. Sitter at bedside.

## 2014-07-07 ENCOUNTER — Other Ambulatory Visit: Payer: Self-pay

## 2014-07-07 ENCOUNTER — Encounter (HOSPITAL_COMMUNITY): Payer: Self-pay

## 2014-07-07 ENCOUNTER — Emergency Department (HOSPITAL_COMMUNITY): Payer: Medicare Other

## 2014-07-07 ENCOUNTER — Observation Stay (HOSPITAL_COMMUNITY)
Admission: EM | Admit: 2014-07-07 | Discharge: 2014-07-10 | Disposition: A | Discharge status: Home / Self Care | Payer: Medicare Other | Attending: Internal Medicine | Admitting: Internal Medicine

## 2014-07-07 ENCOUNTER — Inpatient Hospital Stay (HOSPITAL_COMMUNITY): Payer: Medicare Other

## 2014-07-07 DIAGNOSIS — Z79899 Other long term (current) drug therapy: Secondary | ICD-10-CM | POA: Diagnosis not present

## 2014-07-07 DIAGNOSIS — S4992XA Unspecified injury of left shoulder and upper arm, initial encounter: Secondary | ICD-10-CM | POA: Insufficient documentation

## 2014-07-07 DIAGNOSIS — S4991XA Unspecified injury of right shoulder and upper arm, initial encounter: Secondary | ICD-10-CM | POA: Insufficient documentation

## 2014-07-07 DIAGNOSIS — N181 Chronic kidney disease, stage 1: Secondary | ICD-10-CM

## 2014-07-07 DIAGNOSIS — G894 Chronic pain syndrome: Secondary | ICD-10-CM | POA: Diagnosis present

## 2014-07-07 DIAGNOSIS — Z720 Tobacco use: Secondary | ICD-10-CM | POA: Diagnosis not present

## 2014-07-07 DIAGNOSIS — W1830XA Fall on same level, unspecified, initial encounter: Secondary | ICD-10-CM | POA: Diagnosis not present

## 2014-07-07 DIAGNOSIS — R609 Edema, unspecified: Secondary | ICD-10-CM

## 2014-07-07 DIAGNOSIS — Y998 Other external cause status: Secondary | ICD-10-CM | POA: Diagnosis not present

## 2014-07-07 DIAGNOSIS — E876 Hypokalemia: Secondary | ICD-10-CM | POA: Diagnosis not present

## 2014-07-07 DIAGNOSIS — S0990XA Unspecified injury of head, initial encounter: Principal | ICD-10-CM | POA: Insufficient documentation

## 2014-07-07 DIAGNOSIS — R600 Localized edema: Secondary | ICD-10-CM | POA: Diagnosis present

## 2014-07-07 DIAGNOSIS — S8992XA Unspecified injury of left lower leg, initial encounter: Secondary | ICD-10-CM | POA: Insufficient documentation

## 2014-07-07 DIAGNOSIS — W19XXXA Unspecified fall, initial encounter: Secondary | ICD-10-CM

## 2014-07-07 DIAGNOSIS — E119 Type 2 diabetes mellitus without complications: Secondary | ICD-10-CM | POA: Insufficient documentation

## 2014-07-07 DIAGNOSIS — S0500XA Injury of conjunctiva and corneal abrasion without foreign body, unspecified eye, initial encounter: Secondary | ICD-10-CM

## 2014-07-07 DIAGNOSIS — Z7982 Long term (current) use of aspirin: Secondary | ICD-10-CM | POA: Diagnosis not present

## 2014-07-07 DIAGNOSIS — Y9289 Other specified places as the place of occurrence of the external cause: Secondary | ICD-10-CM | POA: Insufficient documentation

## 2014-07-07 DIAGNOSIS — M6289 Other specified disorders of muscle: Secondary | ICD-10-CM

## 2014-07-07 DIAGNOSIS — I10 Essential (primary) hypertension: Secondary | ICD-10-CM | POA: Diagnosis not present

## 2014-07-07 DIAGNOSIS — T68XXXA Hypothermia, initial encounter: Secondary | ICD-10-CM | POA: Diagnosis not present

## 2014-07-07 DIAGNOSIS — Y9389 Activity, other specified: Secondary | ICD-10-CM | POA: Insufficient documentation

## 2014-07-07 DIAGNOSIS — S8991XA Unspecified injury of right lower leg, initial encounter: Secondary | ICD-10-CM | POA: Diagnosis not present

## 2014-07-07 DIAGNOSIS — E0821 Diabetes mellitus due to underlying condition with diabetic nephropathy: Secondary | ICD-10-CM

## 2014-07-07 DIAGNOSIS — R531 Weakness: Secondary | ICD-10-CM | POA: Insufficient documentation

## 2014-07-07 DIAGNOSIS — R4182 Altered mental status, unspecified: Secondary | ICD-10-CM | POA: Insufficient documentation

## 2014-07-07 DIAGNOSIS — F200 Paranoid schizophrenia: Secondary | ICD-10-CM

## 2014-07-07 DIAGNOSIS — N183 Chronic kidney disease, stage 3 unspecified: Secondary | ICD-10-CM | POA: Diagnosis present

## 2014-07-07 DIAGNOSIS — S0501XA Injury of conjunctiva and corneal abrasion without foreign body, right eye, initial encounter: Secondary | ICD-10-CM

## 2014-07-07 LAB — CBC WITH DIFFERENTIAL/PLATELET
Basophils Absolute: 0 10*3/uL (ref 0.0–0.1)
Basophils Relative: 0 % (ref 0–1)
EOS PCT: 4 % (ref 0–5)
Eosinophils Absolute: 0.4 10*3/uL (ref 0.0–0.7)
HEMATOCRIT: 35.7 % — AB (ref 39.0–52.0)
Hemoglobin: 11.4 g/dL — ABNORMAL LOW (ref 13.0–17.0)
LYMPHS ABS: 1.2 10*3/uL (ref 0.7–4.0)
LYMPHS PCT: 15 % (ref 12–46)
MCH: 28.7 pg (ref 26.0–34.0)
MCHC: 31.9 g/dL (ref 30.0–36.0)
MCV: 89.9 fL (ref 78.0–100.0)
MONO ABS: 0.9 10*3/uL (ref 0.1–1.0)
Monocytes Relative: 11 % (ref 3–12)
Neutro Abs: 5.5 10*3/uL (ref 1.7–7.7)
Neutrophils Relative %: 70 % (ref 43–77)
PLATELETS: 412 10*3/uL — AB (ref 150–400)
RBC: 3.97 MIL/uL — AB (ref 4.22–5.81)
RDW: 15.2 % (ref 11.5–15.5)
WBC: 7.9 10*3/uL (ref 4.0–10.5)

## 2014-07-07 LAB — URINALYSIS, ROUTINE W REFLEX MICROSCOPIC
Bilirubin Urine: NEGATIVE
Glucose, UA: NEGATIVE mg/dL
KETONES UR: NEGATIVE mg/dL
Leukocytes, UA: NEGATIVE
Nitrite: NEGATIVE
PH: 6.5 (ref 5.0–8.0)
Protein, ur: 100 mg/dL — AB
Specific Gravity, Urine: 1.02 (ref 1.005–1.030)
Urobilinogen, UA: 0.2 mg/dL (ref 0.0–1.0)

## 2014-07-07 LAB — BASIC METABOLIC PANEL
Anion gap: 13 (ref 5–15)
BUN: 22 mg/dL (ref 6–23)
CALCIUM: 9.4 mg/dL (ref 8.4–10.5)
CO2: 27 mEq/L (ref 19–32)
Chloride: 103 mEq/L (ref 96–112)
Creatinine, Ser: 1.29 mg/dL (ref 0.50–1.35)
GFR calc Af Amer: 73 mL/min — ABNORMAL LOW (ref >90)
GFR calc non Af Amer: 63 mL/min — ABNORMAL LOW (ref >90)
Glucose, Bld: 183 mg/dL — ABNORMAL HIGH (ref 70–99)
Potassium: 3.9 mEq/L (ref 3.7–5.3)
SODIUM: 143 meq/L (ref 137–147)

## 2014-07-07 LAB — TROPONIN I: Troponin I: <0.3 ng/mL (ref <0.30)

## 2014-07-07 LAB — BLOOD GAS, ARTERIAL
Acid-Base Excess: 2.5 mmol/L — ABNORMAL HIGH (ref 0.0–2.0)
Bicarbonate: 27.2 mEq/L — ABNORMAL HIGH (ref 20.0–24.0)
Drawn by: 234301
FIO2: 21 %
O2 SAT: 94.9 %
PATIENT TEMPERATURE: 37
TCO2: 24.9 mmol/L (ref 0–100)
pCO2 arterial: 47.2 mmHg — ABNORMAL HIGH (ref 35.0–45.0)
pH, Arterial: 7.378 (ref 7.350–7.450)
pO2, Arterial: 76 mmHg — ABNORMAL LOW (ref 80.0–100.0)

## 2014-07-07 LAB — GLUCOSE, CAPILLARY: Glucose-Capillary: 141 mg/dL — ABNORMAL HIGH (ref 70–99)

## 2014-07-07 LAB — URINE MICROSCOPIC-ADD ON

## 2014-07-07 LAB — RAPID URINE DRUG SCREEN, HOSP PERFORMED
AMPHETAMINES: NOT DETECTED
Barbiturates: NOT DETECTED
Benzodiazepines: NOT DETECTED
Cocaine: NOT DETECTED
OPIATES: NOT DETECTED
Tetrahydrocannabinol: NOT DETECTED

## 2014-07-07 LAB — AMMONIA: Ammonia: 51 umol/L (ref 11–60)

## 2014-07-07 LAB — I-STAT CG4 LACTIC ACID, ED: LACTIC ACID, VENOUS: 1.5 mmol/L (ref 0.5–2.2)

## 2014-07-07 LAB — ETHANOL

## 2014-07-07 MED ORDER — CLONIDINE HCL 0.1 MG PO TABS
0.1000 mg | ORAL_TABLET | Freq: Two times a day (BID) | ORAL | Status: DC
Start: 2014-07-07 — End: 2014-07-10
  Administered 2014-07-07 – 2014-07-10 (×6): 0.1 mg via ORAL
  Filled 2014-07-07 (×6): qty 1

## 2014-07-07 MED ORDER — HYDRALAZINE HCL 25 MG PO TABS
100.0000 mg | ORAL_TABLET | Freq: Three times a day (TID) | ORAL | Status: DC
  Administered 2014-07-07 – 2014-07-10 (×9): 100 mg via ORAL
  Filled 2014-07-07 (×9): qty 4

## 2014-07-07 MED ORDER — SODIUM CHLORIDE 0.9 % IJ SOLN
3.0000 mL | Freq: Two times a day (BID) | INTRAMUSCULAR | Status: DC
  Administered 2014-07-07 – 2014-07-10 (×4): 3 mL via INTRAVENOUS

## 2014-07-07 MED ORDER — SODIUM CHLORIDE 0.9 % IV SOLN: 250.0000 mL | INTRAVENOUS | Status: DC | PRN

## 2014-07-07 MED ORDER — SODIUM CHLORIDE 0.9 % IV BOLUS (SEPSIS)
1000.0000 mL | Freq: Once | INTRAVENOUS | Status: AC
  Administered 2014-07-07: 1000 mL via INTRAVENOUS

## 2014-07-07 MED ORDER — FUROSEMIDE 40 MG PO TABS
40.0000 mg | ORAL_TABLET | Freq: Two times a day (BID) | ORAL | Status: DC
  Administered 2014-07-08 – 2014-07-10 (×5): 40 mg via ORAL
  Filled 2014-07-07 (×6): qty 1

## 2014-07-07 MED ORDER — HEPARIN SODIUM (PORCINE) 5000 UNIT/ML IJ SOLN
5000.0000 [IU] | Freq: Three times a day (TID) | INTRAMUSCULAR | Status: DC
  Administered 2014-07-07 – 2014-07-10 (×8): 5000 [IU] via SUBCUTANEOUS
  Filled 2014-07-07 (×8): qty 1

## 2014-07-07 MED ORDER — ACETAMINOPHEN 325 MG PO TABS
650.0000 mg | ORAL_TABLET | Freq: Four times a day (QID) | ORAL | Status: DC | PRN
  Administered 2014-07-09 – 2014-07-10 (×2): 650 mg via ORAL
  Filled 2014-07-07 (×2): qty 2

## 2014-07-07 MED ORDER — POTASSIUM CHLORIDE CRYS ER 10 MEQ PO TBCR
10.0000 meq | EXTENDED_RELEASE_TABLET | Freq: Two times a day (BID) | ORAL | Status: DC
  Administered 2014-07-07 – 2014-07-10 (×6): 10 meq via ORAL
  Filled 2014-07-07 (×10): qty 1

## 2014-07-07 MED ORDER — ACETAMINOPHEN 650 MG RE SUPP: 650.0000 mg | Freq: Four times a day (QID) | RECTAL | Status: DC | PRN

## 2014-07-07 MED ORDER — HALOPERIDOL 2 MG PO TABS
10.0000 mg | ORAL_TABLET | Freq: Every day | ORAL | Status: DC
  Administered 2014-07-08 – 2014-07-10 (×3): 10 mg via ORAL
  Filled 2014-07-07 (×3): qty 5

## 2014-07-07 MED ORDER — ONDANSETRON HCL 4 MG PO TABS: 4.0000 mg | ORAL_TABLET | Freq: Four times a day (QID) | ORAL | Status: DC | PRN

## 2014-07-07 MED ORDER — SODIUM CHLORIDE 0.9 % IJ SOLN
3.0000 mL | INTRAMUSCULAR | Status: DC | PRN
  Administered 2014-07-09: 3 mL via INTRAVENOUS
  Filled 2014-07-07: qty 3

## 2014-07-07 MED ORDER — AMLODIPINE BESYLATE 5 MG PO TABS
5.0000 mg | ORAL_TABLET | Freq: Two times a day (BID) | ORAL | Status: DC
  Administered 2014-07-07 – 2014-07-10 (×6): 5 mg via ORAL
  Filled 2014-07-07 (×6): qty 1

## 2014-07-07 MED ORDER — LABETALOL HCL 200 MG PO TABS
200.0000 mg | ORAL_TABLET | Freq: Two times a day (BID) | ORAL | Status: DC
  Administered 2014-07-07 – 2014-07-10 (×6): 200 mg via ORAL
  Filled 2014-07-07 (×6): qty 1

## 2014-07-07 MED ORDER — BENZTROPINE MESYLATE 1 MG PO TABS
0.5000 mg | ORAL_TABLET | Freq: Every day | ORAL | Status: DC
  Administered 2014-07-08 – 2014-07-10 (×3): 0.5 mg via ORAL
  Filled 2014-07-07 (×3): qty 1

## 2014-07-07 MED ORDER — ASPIRIN EC 81 MG PO TBEC
81.0000 mg | DELAYED_RELEASE_TABLET | Freq: Every day | ORAL | Status: DC
  Administered 2014-07-07 – 2014-07-10 (×4): 81 mg via ORAL
  Filled 2014-07-07 (×4): qty 1

## 2014-07-07 MED ORDER — INSULIN ASPART 100 UNIT/ML ~~LOC~~ SOLN
0.0000 [IU] | Freq: Three times a day (TID) | SUBCUTANEOUS | Status: DC
  Administered 2014-07-08 – 2014-07-10 (×7): 1 [IU] via SUBCUTANEOUS

## 2014-07-07 MED ORDER — TETRACAINE HCL 0.5 % OP SOLN
OPHTHALMIC | Status: AC
  Filled 2014-07-07: qty 2

## 2014-07-07 MED ORDER — GABAPENTIN 400 MG PO CAPS
400.0000 mg | ORAL_CAPSULE | Freq: Three times a day (TID) | ORAL | Status: DC
  Administered 2014-07-07 – 2014-07-10 (×9): 400 mg via ORAL
  Filled 2014-07-07 (×11): qty 1

## 2014-07-07 MED ORDER — TRAZODONE HCL 50 MG PO TABS
100.0000 mg | ORAL_TABLET | Freq: Every day | ORAL | Status: DC
  Administered 2014-07-07 – 2014-07-09 (×3): 100 mg via ORAL
  Filled 2014-07-07 (×4): qty 2

## 2014-07-07 MED ORDER — ONDANSETRON HCL 4 MG/2ML IJ SOLN: 4.0000 mg | Freq: Four times a day (QID) | INTRAMUSCULAR | Status: DC | PRN

## 2014-07-07 MED ORDER — FLUORESCEIN SODIUM 1 MG OP STRP
ORAL_STRIP | OPHTHALMIC | Status: AC
  Filled 2014-07-07: qty 1

## 2014-07-07 MED ORDER — SULFACETAMIDE-PREDNISOLONE 10-0.2 % OP OINT
TOPICAL_OINTMENT | Freq: Four times a day (QID) | OPHTHALMIC | Status: DC
  Filled 2014-07-07: qty 3.5

## 2014-07-07 MED ORDER — LAMOTRIGINE 100 MG PO TABS
100.0000 mg | ORAL_TABLET | Freq: Every day | ORAL | Status: DC
Start: 2014-07-08 — End: 2014-07-10
  Administered 2014-07-08 – 2014-07-10 (×3): 100 mg via ORAL
  Filled 2014-07-07 (×5): qty 1

## 2014-07-07 NOTE — Progress Notes (Signed)
Pt received onto floor and transferred to bed.  Meal provided with drink.  Pt denies pain.  Call bell in reach, telephone also.  Pt stable and denies pain at this time.  Bed alarm on.  All needs met at this time.  Will continue to monitor.

## 2014-07-07 NOTE — ED Notes (Signed)
Bair hugger d/c'd due to normal temperature.
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
## 2014-07-07 NOTE — ED Notes (Signed)
Right periorbital area swollen. Conjunctiva red. Patient c/o generalized weakness for two days, fell at [facility], seen at [facility] for fall. Patient alert/oriented. Patient incontinent of urine upon arrival. Normally continent.

## 2014-07-07 NOTE — ED Notes (Signed)
Pt going to CT

## 2014-07-07 NOTE — ED Notes (Signed)
Warm blankets provided for core temp.
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
## 2014-07-07 NOTE — ED Notes (Signed)
Report given to Nikki, RN unit 300. Ready to receive patient. 

## 2014-07-07 NOTE — ED Notes (Addendum)
Bair Hugger applied

## 2014-07-08 DIAGNOSIS — S0501XD Injury of conjunctiva and corneal abrasion without foreign body, right eye, subsequent encounter: Secondary | ICD-10-CM

## 2014-07-08 DIAGNOSIS — N183 Chronic kidney disease, stage 3 (moderate): Secondary | ICD-10-CM

## 2014-07-08 LAB — CBC
HCT: 32.7 % — ABNORMAL LOW (ref 39.0–52.0)
Hemoglobin: 10.3 g/dL — ABNORMAL LOW (ref 13.0–17.0)
MCH: 28.4 pg (ref 26.0–34.0)
MCHC: 31.5 g/dL (ref 30.0–36.0)
MCV: 90.1 fL (ref 78.0–100.0)
Platelets: 406 10*3/uL — ABNORMAL HIGH (ref 150–400)
RBC: 3.63 MIL/uL — AB (ref 4.22–5.81)
RDW: 15.3 % (ref 11.5–15.5)
WBC: 8.1 10*3/uL (ref 4.0–10.5)

## 2014-07-08 LAB — COMPREHENSIVE METABOLIC PANEL
ALT: 25 U/L (ref 0–53)
AST: 24 U/L (ref 0–37)
Albumin: 2.8 g/dL — ABNORMAL LOW (ref 3.5–5.2)
Alkaline Phosphatase: 110 U/L (ref 39–117)
Anion gap: 10 (ref 5–15)
BILIRUBIN TOTAL: 0.2 mg/dL — AB (ref 0.3–1.2)
BUN: 24 mg/dL — ABNORMAL HIGH (ref 6–23)
CHLORIDE: 104 meq/L (ref 96–112)
CO2: 27 meq/L (ref 19–32)
Calcium: 9 mg/dL (ref 8.4–10.5)
Creatinine, Ser: 1.64 mg/dL — ABNORMAL HIGH (ref 0.50–1.35)
GFR calc Af Amer: 54 mL/min — ABNORMAL LOW (ref >90)
GFR, EST NON AFRICAN AMERICAN: 47 mL/min — AB (ref >90)
Glucose, Bld: 121 mg/dL — ABNORMAL HIGH (ref 70–99)
Potassium: 4.1 mEq/L (ref 3.7–5.3)
SODIUM: 141 meq/L (ref 137–147)
Total Protein: 6.8 g/dL (ref 6.0–8.3)

## 2014-07-08 LAB — GLUCOSE, CAPILLARY
Glucose-Capillary: 125 mg/dL — ABNORMAL HIGH (ref 70–99)
Glucose-Capillary: 145 mg/dL — ABNORMAL HIGH (ref 70–99)
Glucose-Capillary: 143 mg/dL — ABNORMAL HIGH (ref 70–99)
Glucose-Capillary: 142 mg/dL — ABNORMAL HIGH (ref 70–99)

## 2014-07-08 MED ORDER — SULFACETAMIDE-PREDNISOLONE 10-0.2 % OP SUSP: 1.0000 [drp] | Freq: Four times a day (QID) | OPHTHALMIC | Status: DC

## 2014-07-08 MED ORDER — SULFACETAMIDE-PREDNISOLONE 10-0.2 % OP SUSP
1.0000 [drp] | Freq: Four times a day (QID) | OPHTHALMIC | Status: DC
  Administered 2014-07-08 – 2014-07-10 (×10): 1 [drp] via OPHTHALMIC
  Filled 2014-07-08: qty 5

## 2014-07-08 NOTE — Care Management Utilization Note (Signed)
UR review complete.  
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
## 2014-07-08 NOTE — Progress Notes (Signed)
Patient Discharge Instructions:  After Visit Summary (AVS):   Faxed to:  07/08/14 Discharge Summary Note:   Faxed to:  07/08/14 Psychiatric Admission Assessment Note:   Faxed to:  07/08/14 Suicide Risk Assessment - Discharge Assessment:   Faxed to:  07/08/14 Faxed/Sent to the Next Level Care provider:  07/08/14 Faxed to Murvin Natal MD @ [phone] Faxed to [facility] @ [place], 07/08/2014, 2:23 PM

## 2014-07-08 NOTE — Progress Notes (Signed)
[facility] PROGRESS NOTE  Ivan [name] [id number] DOB: 02-11-1961 DOA: 07/07/2014 PCP: Cleda Mccreedy, MD  Assessment/Plan: Generalized Weakness L>R -PT confirms that he has significant deficits and is not able to even sit up without max assistance. -Called sister and she confirms these changes are new and developed yesterday after DC from [facility]. -MRI negative for acute infarct. -?psych component? -Check TSH/B12/RPR. -Will request neurology evaluation.  Corneal Abrasion -Needs Ophtalmology follow up. -When I ask him about the mechanism of injury: "too long of a story for me to go into it right now".  Paranoid Schizophrenia -Continue home meds: lamictal, haldol, trazodone, cogentin  CKD Stage III -At baseline  HTN -Fair control. -Continue current regimen.  DM -Fair control. -Continue current regimen.  Code Status: Full Code Family Communication: Sister via phone  Disposition Plan: SNF   Consultants:  Neurology   Antibiotics:  None   Subjective: Eyes closed, wants to be left alone. Still feels weak.  Objective: Filed Vitals:   07/07/14 1630 07/07/14 1745 07/07/14 2128 07/08/14 0549  BP: 188/90 182/84 184/68 134/74  Pulse:  86 82 78  Temp:  98.6 F (37 C) 100.2 F (37.9 C) 100 F (37.8 C)  TempSrc:  Oral Oral Oral  Resp: 20 18 18 18   Height:  6\' 3"  (1.905 m)    Weight:  126.1 kg (278 lb)    SpO2:  100% 100% 94%    Intake/Output Summary (Last 24 hours) at 07/08/14 1423 Last data filed at 07/08/14 1353  Gross per 24 hour  Intake    360 ml  Output    850 ml  Net   -490 ml   Filed Weights   07/07/14 1353 07/07/14 1745  Weight: 124.739 kg (275 lb) 126.1 kg (278 lb)    Exam:   General:  AA Ox3  Cardiovascular: RRR  Respiratory: CTA B  Abdomen: S/NT/ND/+BS  Extremities: 1+ edema bilaterally   Neurologic:  Moves all 4 spontaneously  Data Reviewed: Basic Metabolic Panel:  Recent Labs Lab 07/06/14 0133 07/07/14 1234  07/08/14 0559  NA 140 143 141  K 3.6* 3.9 4.1  CL 101 103 104  CO2 25 27 27   GLUCOSE 157* 183* 121*  BUN 32* 22 24*  CREATININE 2.09* 1.29 1.64*  CALCIUM 9.6 9.4 9.0  MG 1.7  --   --    Liver Function Tests:  Recent Labs Lab 07/08/14 0559  AST 24  ALT 25  ALKPHOS 110  BILITOT 0.2*  PROT 6.8  ALBUMIN 2.8*   No results for input(s): LIPASE, AMYLASE in the last 168 hours.  Recent Labs Lab 07/07/14 1337  AMMONIA 51   CBC:  Recent Labs Lab 07/06/14 0133 07/07/14 1234 07/08/14 0559  WBC 7.1 7.9 8.1  NEUTROABS  --  5.5  --   HGB 10.4* 11.4* 10.3*  HCT 32.1* 35.7* 32.7*  MCV 88.9 89.9 90.1  PLT 359 412* 406*   Cardiac Enzymes:  Recent Labs Lab 07/07/14 1234  TROPONINI <0.30   BNP (last 3 results)  Recent Labs  04/15/14 1912  PROBNP 167.0*   CBG:  Recent Labs Lab 07/06/14 0112 07/06/14 1159 07/07/14 2222 07/08/14 0746 07/08/14 1113  GLUCAP 165* 154* 141* 125* 145*    Recent Results (from the past 240 hour(s))  Blood culture (routine x 2)     Status: None (Preliminary result)   Collection Time: 07/07/14 12:34 PM  Result Value Ref Range Status   Specimen Description BLOOD LEFT HAND  Final  Special Requests BOTTLES DRAWN AEROBIC ONLY 5CC  Final   Culture NO GROWTH 1 DAY  Final   Report Status PENDING  Incomplete  Blood culture (routine x 2)     Status: None (Preliminary result)   Collection Time: 07/07/14  1:38 PM  Result Value Ref Range Status   Specimen Description BLOOD RIGHT HAND  Final   Special Requests BOTTLES DRAWN AEROBIC AND ANAEROBIC 6CC  Final   Culture NO GROWTH 1 DAY  Final   Report Status PENDING  Incomplete     Studies: Ct Head Wo Contrast  07/07/2014   CLINICAL DATA:  Fall 2 days ago. Fatigue and episode of slurred speech. Initial encounter.  EXAM: CT HEAD WITHOUT CONTRAST  CT MAXILLOFACIAL WITHOUT CONTRAST  CT CERVICAL SPINE WITHOUT CONTRAST  TECHNIQUE: Multidetector CT imaging of the head, cervical spine, and  maxillofacial structures were performed using the standard protocol without intravenous contrast. Multiplanar CT image reconstructions of the cervical spine and maxillofacial structures were also generated.  COMPARISON:  None.  FINDINGS: CT HEAD FINDINGS  The brain demonstrates no evidence of hemorrhage, infarction, edema, mass effect, extra-axial fluid collection, hydrocephalus or mass lesion. The skull is unremarkable.  CT MAXILLOFACIAL FINDINGS  No evidence of facial fracture. Paranasal sinuses and mastoid air cells are normally aerated. The nasal septum is in the midline. Visualized airway is normally patent. No masses or lymphadenopathy identified. Orbits have a normal appearance bilaterally.  CT CERVICAL SPINE FINDINGS  The cervical spine shows normal alignment. There is no evidence of acute fracture or subluxation. No soft tissue swelling or hematoma is identified. There are mild proliferative changes at C5-6 and C6-7. Mild proliferative changes of the cervical spine at C5-6 and C6-7. No bony or soft tissue lesions are seen. The visualized airway is normally patent.  IMPRESSION: No acute findings.   Electronically Signed   By: Aletta Edouard M.D.   On: 07/07/2014 14:59   Ct Cervical Spine Wo Contrast  07/07/2014   CLINICAL DATA:  Fall 2 days ago. Fatigue and episode of slurred speech. Initial encounter.  EXAM: CT HEAD WITHOUT CONTRAST  CT MAXILLOFACIAL WITHOUT CONTRAST  CT CERVICAL SPINE WITHOUT CONTRAST  TECHNIQUE: Multidetector CT imaging of the head, cervical spine, and maxillofacial structures were performed using the standard protocol without intravenous contrast. Multiplanar CT image reconstructions of the cervical spine and maxillofacial structures were also generated.  COMPARISON:  None.  FINDINGS: CT HEAD FINDINGS  The brain demonstrates no evidence of hemorrhage, infarction, edema, mass effect, extra-axial fluid collection, hydrocephalus or mass lesion. The skull is unremarkable.  CT  MAXILLOFACIAL FINDINGS  No evidence of facial fracture. Paranasal sinuses and mastoid air cells are normally aerated. The nasal septum is in the midline. Visualized airway is normally patent. No masses or lymphadenopathy identified. Orbits have a normal appearance bilaterally.  CT CERVICAL SPINE FINDINGS  The cervical spine shows normal alignment. There is no evidence of acute fracture or subluxation. No soft tissue swelling or hematoma is identified. There are mild proliferative changes at C5-6 and C6-7. Mild proliferative changes of the cervical spine at C5-6 and C6-7. No bony or soft tissue lesions are seen. The visualized airway is normally patent.  IMPRESSION: No acute findings.   Electronically Signed   By: Aletta Edouard M.D.   On: 07/07/2014 14:59   Mr Brain Wo Contrast  07/07/2014   CLINICAL DATA:  Weakness for 2 days after fall.  EXAM: MRI HEAD WITHOUT CONTRAST  TECHNIQUE: Multiplanar, multiecho pulse sequences of  the brain and surrounding structures were obtained without intravenous contrast.  COMPARISON:  CT of the head July 07, 2014  FINDINGS: Moderately motion degraded examination, per technologist note, patient was claustrophobic and had difficulty tolerating the examination.  No reduced diffusion to suggest acute ischemia. No definite susceptibility artifact to suggest hemorrhage though motion degrades sensitivity for potential microhemorrhage. Ventricles and sulci are normal for patient's age. Mild supratentorial white matter FLAIR T2 hyperintensities suggest chronic small vessel ischemic disease. No midline shift or mass effect. No abnormal extra-axial fluid collections. Major intracranial vascular flow voids seen at the skull base.  Limited assessment of for orbital pathology on this motion degraded examination. Mildly expanded sella, without solid mass ; the pituitary infundibulum is displaced to the RIGHT and this may reflect an arachnoid cyst. No cerebellar tonsillar ectopia. No  suspicious calvarial bone marrow signal.  IMPRESSION: Moderately motion degraded examination without acute intracranial process though, if clinical concern persists, consider repeat evaluation when patient is better able to tolerate examination.  Mild white matter changes suggest chronic small vessel ischemic disease.  Sella expansion likely reflecting sequelae of arachnoid cyst.   Electronically Signed   By: Elon Alas   On: 07/07/2014 21:20   US Venous Img Lower Unilateral Left  07/07/2014   CLINICAL DATA:  Left leg swelling for 5 months.  EXAM: LEFT LOWER EXTREMITY VENOUS DOPPLER ULTRASOUND  TECHNIQUE: Gray-scale sonography with graded compression, as well as color Doppler and duplex ultrasound were performed to evaluate the lower extremity deep venous systems from the level of the common femoral vein and including the common femoral, femoral, profunda femoral, popliteal and calf veins including the posterior tibial, peroneal and gastrocnemius veins when visible. The superficial great saphenous vein was also interrogated. Spectral Doppler was utilized to evaluate flow at rest and with distal augmentation maneuvers in the common femoral, femoral and popliteal veins.  COMPARISON:  None.  FINDINGS: Contralateral Common Femoral Vein: Respiratory phasicity is normal and symmetric with the symptomatic side. No evidence of thrombus. Normal compressibility.  Common Femoral Vein: No evidence of thrombus. Normal compressibility, respiratory phasicity and response to augmentation.  Saphenofemoral Junction: No evidence of thrombus. Normal compressibility and flow on color Doppler imaging.  Profunda Femoral Vein: No evidence of thrombus. Normal compressibility and flow on color Doppler imaging.  Femoral Vein: No evidence of thrombus. Normal compressibility, respiratory phasicity and response to augmentation.  Popliteal Vein: No evidence of thrombus. Normal compressibility, respiratory phasicity and response to  augmentation.  Calf Veins: Not well visualized secondary to edema.  Superficial Great Saphenous Vein: No evidence of thrombus. Normal compressibility and flow on color Doppler imaging.  Venous Reflux:  None.  Other Findings: There is left lower extremity edema as well as right ankle and calf edema.  IMPRESSION: No evidence of deep venous thrombosis.   Electronically Signed   By: Kathreen Devoid   On: 07/07/2014 16:17   Ct Maxillofacial Wo Cm  07/07/2014   CLINICAL DATA:  Fall 2 days ago. Fatigue and episode of slurred speech. Initial encounter.  EXAM: CT HEAD WITHOUT CONTRAST  CT MAXILLOFACIAL WITHOUT CONTRAST  CT CERVICAL SPINE WITHOUT CONTRAST  TECHNIQUE: Multidetector CT imaging of the head, cervical spine, and maxillofacial structures were performed using the standard protocol without intravenous contrast. Multiplanar CT image reconstructions of the cervical spine and maxillofacial structures were also generated.  COMPARISON:  None.  FINDINGS: CT HEAD FINDINGS  The brain demonstrates no evidence of hemorrhage, infarction, edema, mass effect, extra-axial fluid collection, hydrocephalus  or mass lesion. The skull is unremarkable.  CT MAXILLOFACIAL FINDINGS  No evidence of facial fracture. Paranasal sinuses and mastoid air cells are normally aerated. The nasal septum is in the midline. Visualized airway is normally patent. No masses or lymphadenopathy identified. Orbits have a normal appearance bilaterally.  CT CERVICAL SPINE FINDINGS  The cervical spine shows normal alignment. There is no evidence of acute fracture or subluxation. No soft tissue swelling or hematoma is identified. There are mild proliferative changes at C5-6 and C6-7. Mild proliferative changes of the cervical spine at C5-6 and C6-7. No bony or soft tissue lesions are seen. The visualized airway is normally patent.  IMPRESSION: No acute findings.   Electronically Signed   By: Aletta Edouard M.D.   On: 07/07/2014 14:59    Scheduled Meds: .  amLODipine  5 mg Oral BID  . aspirin EC  81 mg Oral Daily  . benztropine  0.5 mg Oral Daily  . cloNIDine  0.1 mg Oral BID  . furosemide  40 mg Oral BID  . gabapentin  400 mg Oral TID  . haloperidol  10 mg Oral Daily  . heparin  5,000 Units Subcutaneous 3 times per day  . hydrALAZINE  100 mg Oral TID  . insulin aspart  0-9 Units Subcutaneous TID WC  . labetalol  200 mg Oral BID  . lamoTRIgine  100 mg Oral Daily  . potassium chloride  10 mEq Oral BID  . sodium chloride  3 mL Intravenous Q12H  . sulfacetamide-prednisolone  1 drop Right Eye QID  . traZODone  100 mg Oral QHS   Continuous Infusions:   Principal Problem:   Weakness generalized Active Problems:   Essential hypertension, benign   Edema of both legs   Chronic paranoid schizophrenia   Diabetes mellitus due to underlying condition with diabetic nephropathy   CKD (chronic kidney disease) stage 3, GFR 30-59 ml/min   Chronic pain syndrome   Corneal abrasion   Left-sided weakness    Time spent: 35 minutes. Greater than 50% of this time was spent in direct contact with the patient coordinating care.    Lelon Frohlich  [facility] Pager [phone]  If 7PM-7AM, please contact night-coverage at [email or web address], password [facility] 07/08/2014, 2:23 PM  LOS: 1 day

## 2014-07-08 NOTE — Progress Notes (Signed)
OT Cancellation Note  Patient Details Name: Ivan [name] MRN: [id number] DOB: 1960/12/26   Cancelled Treatment:    Reason Eval/Treat Not Completed: Other (comment). Pt receiving bath.  Will re-attempt OT eval at later time/date.  Bea Graff Dhanvi Boesen, MS, OTR/L [facility] [phone] 07/08/2014, 9:26 AM

## 2014-07-08 NOTE — Progress Notes (Signed)
Pt refuses to work with PT, unable to specify as to why.  Will try again tomorrow.

## 2014-07-08 NOTE — Progress Notes (Signed)
Nutrition Brief Note  Patient identified on the Malnutrition Screening Tool (MST) Report  Pt discharged from [facility] on 11/18. He presents with bilateral upper and lower extremity weakness. He to to have MRI to r/o stroke.   Wt Readings from Last 15 Encounters:  07/07/14 278 lb (126.1 kg)  07/07/14 278 lb (126.1 kg)  07/02/14 274 lb (124.286 kg)  07/01/14 260 lb (117.935 kg)  05/20/14 278 lb (126.1 kg)  04/13/14 303 lb (137.44 kg)  04/11/14 300 lb (136.079 kg)  04/09/14 300 lb (136.079 kg)  04/07/14 300 lb (136.079 kg)  03/23/13 300 lb (136.079 kg)  02/28/13 285 lb (129.275 kg)  01/08/13 301 lb (136.533 kg)  01/07/13 311 lb (141.069 kg)  10/11/11 281 lb (127.461 kg)    Body mass index is 34.75 kg/(m^2). Patient meets criteria for obesity class I based on current BMI.   His appetite is good. Current diet order is heart healthy/CHO modified, patient is consuming approximately 75% of meals at this time. Labs and medications reviewed.   No nutrition interventions warranted at this time. If nutrition issues arise, please consult RD.   Colman Cater MS,RD,CSG,LDN Office: [phone] Pager: [phone]

## 2014-07-08 NOTE — Care Management Note (Addendum)
    Page 1 of 1   07/09/2014     2:43:42 PM CARE MANAGEMENT NOTE 07/09/2014  Patient:  Ivan [name], Ivan [name]   Account Number:  [id number]  Date Initiated:  07/08/2014  Documentation initiated by:  Jolene Provost  Subjective/Objective Assessment:   Pt is from home with self care.     Action/Plan:   Plan to discharge pt to SNF per PT's recommendations. Pt, Pt's family and CSW aware of d/c plan. No CM needs identified at this time.   Anticipated DC Date:  07/09/2014   Anticipated DC Plan:  SKILLED NURSING FACILITY  In-house referral  Clinical Social Worker      DC Planning Services  CM consult      Choice offered to / List presented to:             Status of service:  Completed, signed off Medicare Important Message given?   (If response is "NO", the following Medicare IM given date fields will be blank) Date Medicare IM given:   Medicare IM given by:   Date Additional Medicare IM given:   Additional Medicare IM given by:    Discharge Disposition:  [place]  Per UR Regulation:    If discussed at Long Length of Stay Meetings, dates discussed:    Comments:  07/09/2014 Jolene Provost, RN, MSN, PCCN  11.20.15 [facility] RNBSN ACM [phone]. [phone] Patient was notified of outpatient status 07/08/14 by Jolene Provost, RN. Paper docs to shadow chart to be scanned.  07/08/2014 [place], RN, MSN, [facility]

## 2014-07-09 DIAGNOSIS — R531 Weakness: Secondary | ICD-10-CM

## 2014-07-09 LAB — GLUCOSE, CAPILLARY
GLUCOSE-CAPILLARY: 127 mg/dL — AB (ref 70–99)
GLUCOSE-CAPILLARY: 126 mg/dL — AB (ref 70–99)
Glucose-Capillary: 131 mg/dL — ABNORMAL HIGH (ref 70–99)
Glucose-Capillary: 137 mg/dL — ABNORMAL HIGH (ref 70–99)
Glucose-Capillary: 114 mg/dL — ABNORMAL HIGH (ref 70–99)

## 2014-07-09 LAB — VITAMIN B12: VITAMIN B 12: 469 pg/mL (ref 211–911)

## 2014-07-09 LAB — TSH: TSH: 0.847 u[IU]/mL (ref 0.350–4.500)

## 2014-07-09 LAB — RPR

## 2014-07-09 LAB — HIV ANTIBODY (ROUTINE TESTING W REFLEX): HIV 1&2 Ab, 4th Generation: NONREACTIVE

## 2014-07-09 NOTE — Progress Notes (Signed)
[facility] PROGRESS NOTE  Casimier Dumitrescu [id number] DOB: [birth year] DOA: 07/07/2014 PCP: Cleda Mccreedy, MD  Assessment/Plan: Generalized Weakness L>R -PT confirms that he has significant deficits and is not able to even sit up without max assistance. -Called sister and she confirms these changes are new and developed  after DC from [facility]. -MRI negative for acute infarct. -?psych component? - TSH/B12/RPR WNL. -Will request neurology evaluation.  Corneal Abrasion -Needs Ophtalmology follow up. -When I ask him about the mechanism of injury: "too long of a story for me to go into it right now". -Is improved today.  Paranoid Schizophrenia -Continue home meds: lamictal, haldol, trazodone, cogentin  CKD Stage III -At baseline  HTN -Well controlled. -Continue current regimen.  DM -Well controlled. -Continue current regimen.  Code Status: Full Code Family Communication: Patient only Disposition Plan: SNF in am 11/21.   Consultants:  Neurology   Antibiotics:  None   Subjective: More interactive today, eye improved.  Objective: Filed Vitals:   07/08/14 2112 07/09/14 0542 07/09/14 0926 07/09/14 1443  BP: 165/69 135/69 154/75 107/57  Pulse: 74 72  71  Temp: 99.3 F (37.4 C) 99.2 F (37.3 C)  98.3 F (36.8 C)  TempSrc: Oral Oral  Oral  Resp: 18 18  20   Height:      Weight:      SpO2: 95% 94%  95%    Intake/Output Summary (Last 24 hours) at 07/09/14 1800 Last data filed at 07/09/14 1700  Gross per 24 hour  Intake    480 ml  Output   1375 ml  Net   -895 ml   Filed Weights   07/07/14 1353 07/07/14 1745  Weight: 124.739 kg (275 lb) 126.1 kg (278 lb)    Exam:   General:  AA Ox3  Cardiovascular: RRR  Respiratory: CTA B  Abdomen: S/NT/ND/+BS  Extremities: 1+ edema bilaterally   Neurologic:  Moves all 4 spontaneously  Data Reviewed: Basic Metabolic Panel:  Recent Labs Lab 07/06/14 0133 07/07/14 1234 07/08/14 0559  NA 140 143  141  K 3.6* 3.9 4.1  CL 101 103 104  CO2 25 27 27   GLUCOSE 157* 183* 121*  BUN 32* 22 24*  CREATININE 2.09* 1.29 1.64*  CALCIUM 9.6 9.4 9.0  MG 1.7  --   --    Liver Function Tests:  Recent Labs Lab 07/08/14 0559  AST 24  ALT 25  ALKPHOS 110  BILITOT 0.2*  PROT 6.8  ALBUMIN 2.8*   No results for input(s): LIPASE, AMYLASE in the last 168 hours.  Recent Labs Lab 07/07/14 1337  AMMONIA 51   CBC:  Recent Labs Lab 07/06/14 0133 07/07/14 1234 07/08/14 0559  WBC 7.1 7.9 8.1  NEUTROABS  --  5.5  --   HGB 10.4* 11.4* 10.3*  HCT 32.1* 35.7* 32.7*  MCV 88.9 89.9 90.1  PLT 359 412* 406*   Cardiac Enzymes:  Recent Labs Lab 07/07/14 1234  TROPONINI <0.30   BNP (last 3 results)  Recent Labs  04/15/14 1912  PROBNP 167.0*   CBG:  Recent Labs Lab 07/08/14 2109 07/09/14 0540 07/09/14 0803 07/09/14 1146 07/09/14 1645  GLUCAP 142* 131* 127* 137* 114*    Recent Results (from the past 240 hour(s))  Blood culture (routine x 2)     Status: None (Preliminary result)   Collection Time: 07/07/14 12:34 PM  Result Value Ref Range Status   Specimen Description BLOOD LEFT HAND  Final   Special Requests BOTTLES DRAWN AEROBIC  ONLY 5CC  Final   Culture NO GROWTH 1 DAY  Final   Report Status PENDING  Incomplete  Blood culture (routine x 2)     Status: None (Preliminary result)   Collection Time: 07/07/14  1:38 PM  Result Value Ref Range Status   Specimen Description BLOOD RIGHT HAND  Final   Special Requests BOTTLES DRAWN AEROBIC AND ANAEROBIC 6CC  Final   Culture NO GROWTH 1 DAY  Final   Report Status PENDING  Incomplete     Studies: Mr Brain Wo Contrast  07/07/2014   CLINICAL DATA:  Weakness for 2 days after fall.  EXAM: MRI HEAD WITHOUT CONTRAST  TECHNIQUE: Multiplanar, multiecho pulse sequences of the brain and surrounding structures were obtained without intravenous contrast.  COMPARISON:  CT of the head July 07, 2014  FINDINGS: Moderately motion degraded  examination, per technologist note, patient was claustrophobic and had difficulty tolerating the examination.  No reduced diffusion to suggest acute ischemia. No definite susceptibility artifact to suggest hemorrhage though motion degrades sensitivity for potential microhemorrhage. Ventricles and sulci are normal for patient's age. Mild supratentorial white matter FLAIR T2 hyperintensities suggest chronic small vessel ischemic disease. No midline shift or mass effect. No abnormal extra-axial fluid collections. Major intracranial vascular flow voids seen at the skull base.  Limited assessment of for orbital pathology on this motion degraded examination. Mildly expanded sella, without solid mass ; the pituitary infundibulum is displaced to the RIGHT and this may reflect an arachnoid cyst. No cerebellar tonsillar ectopia. No suspicious calvarial bone marrow signal.  IMPRESSION: Moderately motion degraded examination without acute intracranial process though, if clinical concern persists, consider repeat evaluation when patient is better able to tolerate examination.  Mild white matter changes suggest chronic small vessel ischemic disease.  Sella expansion likely reflecting sequelae of arachnoid cyst.   Electronically Signed   By: Elon Alas   On: 07/07/2014 21:20    Scheduled Meds: . amLODipine  5 mg Oral BID  . aspirin EC  81 mg Oral Daily  . benztropine  0.5 mg Oral Daily  . cloNIDine  0.1 mg Oral BID  . furosemide  40 mg Oral BID  . gabapentin  400 mg Oral TID  . haloperidol  10 mg Oral Daily  . heparin  5,000 Units Subcutaneous 3 times per day  . hydrALAZINE  100 mg Oral TID  . insulin aspart  0-9 Units Subcutaneous TID WC  . labetalol  200 mg Oral BID  . lamoTRIgine  100 mg Oral Daily  . potassium chloride  10 mEq Oral BID  . sodium chloride  3 mL Intravenous Q12H  . sulfacetamide-prednisolone  1 drop Right Eye QID  . traZODone  100 mg Oral QHS   Continuous Infusions:   Principal  Problem:   Weakness generalized Active Problems:   Essential hypertension, benign   Edema of both legs   Chronic paranoid schizophrenia   Diabetes mellitus due to underlying condition with diabetic nephropathy   CKD (chronic kidney disease) stage 3, GFR 30-59 ml/min   Chronic pain syndrome   Altered mental status   Corneal abrasion   Left-sided weakness    Time spent: 35 minutes. Greater than 50% of this time was spent in direct contact with the patient coordinating care.    Lelon Frohlich  [facility] Pager [phone]  If 7PM-7AM, please contact night-coverage at [email or web address], password [facility] 07/09/2014, 6:00 PM  LOS: 2 days

## 2014-07-09 NOTE — Consult Note (Signed)
[place] A. Merlene Laughter, MD     [email or web address]          Ivan [name] is an 53 y.o. male.   ASSESSMENT/PLAN:  This is a case with episodes of profound weakness and spontaneous recovery over the last several days. In review of the charts, the profound weakness has been associated with hypokalemia. I believe a decent case can be made that the patient has hypokalemic periodic paralysis although the hypokalemic is mild. No other etiology has been uncovered. While psychogenic weakness has been raised and is ever concerning in this patient with a significant underlying psychiatric disorder, psychogenic disorders tend not to fluctuate rapidly like this. The patient examination and imaging argues against central nervous system dysfunction, strokes, myelopathy, acute inflammatory polyradiculopathy neuropathy/Guillain-Barr syndrome and parkinsonism. My impression there for the patient most likely has hypokalemic periodic paralysis. We should aim to keep the patient's potassium closer to 4.  The case was discussed extensively with the patient's sister per his request.  Likely significant obstructive sleep apnea syndrome. Sleep testing in the outpatient setting is recommended.  Obesity.  Diabetic neuropathy mild.  Osteoarthritis.  The patient is a 53 year old black male who presents with profound weakness over the last few days with what appears to be spontaneous recovery. The workup has been essentially unrevealing. He was recently admitted to this hospital and transferred to [facility] and in [place] after presenting with acute psychosis walking around his neighborhood with 2 hammer and apparent threatening. He had been off his medications for several days. He does have a baseline history of schizophrenia. The patient was placed back on his medications and his psychosis and psychiatric problems returned to baseline. He essentially had been stabilized.  It appears that his medications were simply restarted without administration of significant knee medications. The patient reports that his legs and upper extremities essentially felt limp. He does not report having numbness or tingling that is new. He occasionally has episodic numbness of his hands but this was not new. The patient denies loss of consciousness, altered mental status, headaches, dysarthria, dyspnea, chest pain, GI GU symptoms. Review of the patient's records indicate that he has had mild hypokalemia with these events over the last few days. The hypokalemia has been mostly in the low threes. The patient reports that he has been on increased psychosocial stresses the last 4 months with the death of his mother. No other recent psychosocial stresses are reported. He did go through a divorce about 8 years ago.  GENERAL: This a pleasant overweight man in no acute distress.  HEENT: Supple. Large neck with a crowded posterior air space. Marked indentation of the right sclera with dilated pupil is 6 mm and sluggishly reactive. Right 4 mm and briskly reactive.  ABDOMEN: soft  EXTREMITIES: Nonpitting edema of the feet and legs more in the left side. They is associated browning of the skill with scaly formation especially on the left side. There are also varicosities noted again more  on the left side. Marked arthritic changes of the knees.  BACK: Normal.  SKIN: Normal by inspection.    MENTAL STATUS: Alert and oriented. Speech, language and cognition are generally intact. Judgment and insight normal.   CRANIAL NERVES: The extra ocular movements are full, there is no significant nystagmus; visual fields are full; upper and lower facial muscles are normal in strength and symmetric, there is no flattening of the nasolabial folds; tongue is midline; uvula is midline; shoulder elevation is normal.  MOTOR: Deltoid 4+ bilaterally. Hand grip 5. Mild downward drift bilaterally. Hip flexion 4/5 and  dorsiflexion 4+/5 bilaterally. Bulk and tone seems unremarkable throughout.  COORDINATION: Left finger to nose is normal, right finger to nose is normal, No rest tremor; no intention tremor; no postural tremor; no bradykinesia. No cogwheeling. No bradykinesia.  REFLEXES: Deep tendon reflexes are symmetrical and normal in the upper extremities. Left knee jerk 2+ and right 1+. Ankle jerks 0. Babinski reflexes are flexor on the left and upgoing on the right.   SENSATION: Normal to light touch.     Blood pressure 107/57, pulse 71, temperature 98.3 F (36.8 C), temperature source Oral, resp. rate 20, height '6\' 3"'  (1.905 m), weight 126.1 kg (278 lb), SpO2 95 %.  Past Medical History  Diagnosis Date  . Diabetes mellitus   . Hypertension   . Bipolar 1 disorder   . Schizoaffective disorder   . Hypokalemia 04/02/2013  . Edema of both legs 04/07/2013  . Paranoid schizophrenia     Past Surgical History  Procedure Laterality Date  . Breast surgery    . Fracture surgery      ribs and arm    No family history on file.  Social History:  reports that he has been smoking Cigarettes and Cigars.  He has a 19.5 pack-year smoking history. He has never used smokeless tobacco. He reports that he drinks about 1.2 oz of alcohol per week. He reports that he does not use illicit drugs.  Allergies:  Allergies  Allergen Reactions  . Bee Venom Swelling    Medications: Prior to Admission medications   Medication Sig Start Date End Date Taking? Authorizing Provider  amLODipine (NORVASC) 5 MG tablet Take 1 tablet (5 mg total) by mouth 2 (two) times daily. 07/06/14  Yes Elmarie Shiley, NP  aspirin EC 81 MG tablet Take 1 tablet (81 mg total) by mouth daily. 07/06/14  Yes Elmarie Shiley, NP  benztropine (COGENTIN) 0.5 MG tablet Take 1 tablet (0.5 mg total) by mouth daily. 07/06/14  Yes Elmarie Shiley, NP  cloNIDine (CATAPRES) 0.1 MG tablet Take 1 tablet (0.1 mg total) by mouth 2 (two) times daily. 07/06/14  Yes  Elmarie Shiley, NP  furosemide (LASIX) 40 MG tablet Take 1 tablet (40 mg total) by mouth 2 (two) times daily. 07/06/14  Yes Elmarie Shiley, NP  gabapentin (NEURONTIN) 400 MG capsule Take 1 capsule (400 mg total) by mouth 3 (three) times daily. 07/06/14  Yes Elmarie Shiley, NP  glipiZIDE (GLUCOTROL) 5 MG tablet Take 1 tablet (5 mg total) by mouth daily before breakfast. 07/06/14  Yes Elmarie Shiley, NP  haloperidol (HALDOL) 10 MG tablet Take 1 tablet (10 mg total) by mouth daily. 07/06/14  Yes Elmarie Shiley, NP  hydrALAZINE (APRESOLINE) 100 MG tablet Take 1 tablet (100 mg total) by mouth 3 (three) times daily. 07/06/14  Yes Elmarie Shiley, NP  labetalol (NORMODYNE) 200 MG tablet Take 1 tablet (200 mg total) by mouth 2 (two) times daily. 07/06/14  Yes Elmarie Shiley, NP  lamoTRIgine (LAMICTAL) 100 MG tablet Take 1 tablet (100 mg total) by mouth daily. 07/06/14  Yes Elmarie Shiley, NP  potassium chloride (K-DUR) 10 MEQ tablet Take 1 tablet (10 mEq total) by mouth 2 (two) times daily. 07/06/14  Yes Elmarie Shiley, NP  sitaGLIPtin (JANUVIA) 100 MG tablet Take 1 tablet (100 mg total) by mouth daily. 07/06/14  Yes Elmarie Shiley, NP  traZODone (DESYREL) 100 MG tablet Take 1 tablet (100 mg total) by mouth at  bedtime. 07/06/14  Yes Elmarie Shiley, NP  Vitamin D, Ergocalciferol, (DRISDOL) 50000 UNITS CAPS capsule Take 1 capsule (50,000 Units total) by mouth every 21 ( twenty-one) days. 07/06/14  Yes Elmarie Shiley, NP    Scheduled Meds: . amLODipine  5 mg Oral BID  . aspirin EC  81 mg Oral Daily  . benztropine  0.5 mg Oral Daily  . cloNIDine  0.1 mg Oral BID  . furosemide  40 mg Oral BID  . gabapentin  400 mg Oral TID  . haloperidol  10 mg Oral Daily  . heparin  5,000 Units Subcutaneous 3 times per day  . hydrALAZINE  100 mg Oral TID  . insulin aspart  0-9 Units Subcutaneous TID WC  . labetalol  200 mg Oral BID  . lamoTRIgine  100 mg Oral Daily  . potassium chloride  10 mEq Oral BID  . sodium chloride  3 mL Intravenous Q12H  .  sulfacetamide-prednisolone  1 drop Right Eye QID  . traZODone  100 mg Oral QHS   Continuous Infusions:  PRN Meds:.sodium chloride, acetaminophen **OR** acetaminophen, ondansetron **OR** ondansetron (ZOFRAN) IV, sodium chloride     Results for orders placed or performed during the hospital encounter of 07/07/14 (from the past 48 hour(s))  Glucose, capillary     Status: Abnormal   Collection Time: 07/07/14 10:22 PM  Result Value Ref Range   Glucose-Capillary 141 (H) 70 - 99 mg/dL   Comment 1 Notify RN   CBC     Status: Abnormal   Collection Time: 07/08/14  5:59 AM  Result Value Ref Range   WBC 8.1 4.0 - 10.5 K/uL   RBC 3.63 (L) 4.22 - 5.81 MIL/uL   Hemoglobin 10.3 (L) 13.0 - 17.0 g/dL   HCT 32.7 (L) 39.0 - 52.0 %   MCV 90.1 78.0 - 100.0 fL   MCH 28.4 26.0 - 34.0 pg   MCHC 31.5 30.0 - 36.0 g/dL   RDW 15.3 11.5 - 15.5 %   Platelets 406 (H) 150 - 400 K/uL  Comprehensive metabolic panel     Status: Abnormal   Collection Time: 07/08/14  5:59 AM  Result Value Ref Range   Sodium 141 137 - 147 mEq/L   Potassium 4.1 3.7 - 5.3 mEq/L   Chloride 104 96 - 112 mEq/L   CO2 27 19 - 32 mEq/L   Glucose, Bld 121 (H) 70 - 99 mg/dL   BUN 24 (H) 6 - 23 mg/dL   Creatinine, Ser 1.64 (H) 0.50 - 1.35 mg/dL   Calcium 9.0 8.4 - 10.5 mg/dL   Total Protein 6.8 6.0 - 8.3 g/dL   Albumin 2.8 (L) 3.5 - 5.2 g/dL   AST 24 0 - 37 U/L   ALT 25 0 - 53 U/L   Alkaline Phosphatase 110 39 - 117 U/L   Total Bilirubin 0.2 (L) 0.3 - 1.2 mg/dL   GFR calc non Af Amer 47 (L) >90 mL/min   GFR calc Af Amer 54 (L) >90 mL/min    Comment: (NOTE) The eGFR has been calculated using the CKD EPI equation. This calculation has not been validated in all clinical situations. eGFR's persistently <90 mL/min signify possible Chronic Kidney Disease.    Anion gap 10 5 - 15  Glucose, capillary     Status: Abnormal   Collection Time: 07/08/14  7:46 AM  Result Value Ref Range   Glucose-Capillary 125 (H) 70 - 99 mg/dL   Comment 1  Notify RN   Glucose,  capillary     Status: Abnormal   Collection Time: 07/08/14 11:13 AM  Result Value Ref Range   Glucose-Capillary 145 (H) 70 - 99 mg/dL  Vitamin B12     Status: None   Collection Time: 07/08/14  3:14 PM  Result Value Ref Range   Vitamin B-12 469 211 - 911 pg/mL    Comment: Performed at [facility]  RPR     Status: None   Collection Time: 07/08/14  3:14 PM  Result Value Ref Range   RPR NON REAC NON REAC    Comment: Performed at [facility]  HIV antibody     Status: None   Collection Time: 07/08/14  3:14 PM  Result Value Ref Range   HIV 1&2 Ab, 4th Generation NONREACTIVE NONREACTIVE    Comment: (NOTE) A NONREACTIVE HIV Ag/Ab result does not exclude HIV infection since the time frame for seroconversion is variable. If acute HIV infection is suspected, a HIV-1 RNA Qualitative TMA test is recommended. HIV-1/2 Antibody Diff         Not indicated. HIV-1 RNA, Qual TMA           Not indicated. PLEASE NOTE: This information has been disclosed to you from records whose confidentiality may be protected by state law. If your state requires such protection, then the state law prohibits you from making any further disclosure of the information without the specific written consent of the person to whom it pertains, or as otherwise permitted by law. A general authorization for the release of medical or other information is NOT sufficient for this purpose. The performance of this assay has not been clinically validated in patients less than 81 years old. Performed at [facility]   Glucose, capillary     Status: Abnormal   Collection Time: 07/08/14  5:11 PM  Result Value Ref Range   Glucose-Capillary 143 (H) 70 - 99 mg/dL  Glucose, capillary     Status: Abnormal   Collection Time: 07/08/14  9:09 PM  Result Value Ref Range   Glucose-Capillary 142 (H) 70 - 99 mg/dL   Comment 1 Documented in Chart    Comment 2 Notify RN   Glucose, capillary      Status: Abnormal   Collection Time: 07/09/14  5:40 AM  Result Value Ref Range   Glucose-Capillary 131 (H) 70 - 99 mg/dL  Glucose, capillary     Status: Abnormal   Collection Time: 07/09/14  8:03 AM  Result Value Ref Range   Glucose-Capillary 127 (H) 70 - 99 mg/dL   Comment 1 Notify RN   Glucose, capillary     Status: Abnormal   Collection Time: 07/09/14 11:46 AM  Result Value Ref Range   Glucose-Capillary 137 (H) 70 - 99 mg/dL   Comment 1 Notify RN   Glucose, capillary     Status: Abnormal   Collection Time: 07/09/14  4:45 PM  Result Value Ref Range   Glucose-Capillary 114 (H) 70 - 99 mg/dL   Comment 1 Documented in Chart    Comment 2 Notify RN     Studies/Results:  BRAIN MRI Moderately motion degraded examination without acute intracranial process though, if clinical concern persists, consider repeat evaluation when patient is better able to tolerate examination.  Mild white matter changes suggest chronic small vessel ischemic disease.  Sella expansion likely reflecting sequelae of arachnoid cyst.   CT HEAD and C-SPINE The brain demonstrates no evidence of hemorrhage, infarction, edema, mass effect, extra-axial fluid collection,  hydrocephalus or mass lesion. The skull is unremarkable.  CT MAXILLOFACIAL FINDINGS  No evidence of facial fracture. Paranasal sinuses and mastoid air cells are normally aerated. The nasal septum is in the midline. Visualized airway is normally patent. No masses or lymphadenopathy identified. Orbits have a normal appearance bilaterally.  CT CERVICAL SPINE FINDINGS  The cervical spine shows normal alignment. There is no evidence of acute fracture or subluxation. No soft tissue swelling or hematoma is identified. There are mild proliferative changes at C5-6 and C6-7. Mild proliferative changes of the cervical spine at C5-6 and C6-7. No bony or soft tissue lesions are seen. The visualized airway is normally patent.  IMPRESSION: No  acute findings.   Sharol Croghan A. Merlene Laughter, M.D.  Diplomate, Tax adviser of Psychiatry and Neurology ( Neurology). 07/09/2014, 9:25 PM

## 2014-07-09 NOTE — Clinical Social Work Note (Addendum)
Pt's sister, Hilda Blades requested that CSW send referral to [facility]. Facility reviewed and can accept pt tomorrow. Aware of 30 day pasarr. Debra agreeable.   [name], [place]

## 2014-07-09 NOTE — Clinical Social Work Placement (Addendum)
[place] NOTE 07/09/2014  Patient:  Ivan [name], Ivan [name]  Account Number:  [id number] Admit date:  07/07/2014  Clinical Social Worker:  [name], LCSW  Date/time:  07/09/2014 04:24 PM  Clinical Social Work is seeking post-discharge placement for this patient at the following level of care:   [place]   (*CSW will update this form in Epic as items are completed)   07/09/2014  Patient/family provided with [place] Department of Clinical Social Work's list of facilities offering this level of care within the geographic area requested by the patient (or if unable, by the patient's family).  07/09/2014  Patient/family informed of their freedom to choose among providers that offer the needed level of care, that participate in Medicare, Medicaid or managed care program needed by the patient, have an available bed and are willing to accept the patient.  07/09/2014  Patient/family informed of [facility]' ownership interest in [facility], as well as of the fact that they are under no obligation to receive care at this facility.  PASARR submitted to EDS on 07/08/2014 PASARR number received on 07/09/2014  FL2 transmitted to all facilities in geographic area requested by pt/family on  07/09/2014 FL2 transmitted to all facilities within larger geographic area on   Patient informed that his/her managed care company has contracts with or will negotiate with  certain facilities, including the following:     Patient/family informed of bed offers received:  07/09/2014 Patient chooses bed at  Physician recommends and patient chooses bed at    Patient to be transferred to  on   Patient to be transferred to facility by  Patient and family notified of transfer on  Name of family member notified:    The following physician request were entered in Epic:   Additional Comments: 30 day pasarr.  [name], [place]

## 2014-07-09 NOTE — Clinical Social Work Psychosocial (Signed)
Clinical Social Work Department BRIEF PSYCHOSOCIAL ASSESSMENT 07/09/2014  Patient:  Ivan [name], Ivan [name]     Account Number:  [id number]     Admit date:  07/07/2014  Clinical Social Worker:  Ivan [name]  Date/Time:  07/09/2014 04:25 PM  Referred by:  CSW  Date Referred:  07/09/2014 Referred for  SNF Placement   Other Referral:   Interview type:  Patient Other interview type:   sister/guardian: Ivan [name]    PSYCHOSOCIAL DATA Living Status:  ALONE Admitted from facility:   Level of care:   Primary support name:  Ivan [name] Primary support relationship to patient:  S[place] Degree of support available:   supportive    CURRENT CONCERNS Current Concerns  Post-Acute Placement   Other Concerns:    SOCIAL WORK ASSESSMENT / PLAN CSW met with pt at bedside. Pt alert and oriented and reports he lives alone. Pt was d/c from [facility] the day prior to coming to ED. His family completed IVC as he was non compliant with medications and walking around with hammers very agitated. CSW also spoke with pt's sister, Ivan [name] who states she is legal guardian for pt. Ivan [name] is very concerned about pt as she states he went to sleep and then the next day was unable to do anything. At baseline, pt ambulates and is completely independent. Yesterday with PT, pt was hardly able to even sit up. Today he said he felt much stronger and wanted to work with PT again. He was able to transfer. CSW updated Ivan [name] and discussed recommendation from PT for SNF due to weakness. She feels that pt will need to go somewhere before considering going home. She agreed to initiate referral to [facility] facilities and was notified that [facility] was able to make bed offer. Ivan [name] agreed to go visit facility. She is concerned that pt is almost ready for d/c and she has not been able to tour anywhere yet. CSW expressed understanding, but also notified her that once pt was stable if a bed was not accepted of those that offered, then pt would need to  return home with home health. She reports understanding and said she would consider options. Pasarr submitted and 30 day pasarr received. MD updated.   Assessment/plan status:  Psychosocial Support/Ongoing Assessment of Needs Other assessment/ plan:   Information/referral to community resources:   SNF list    PATIENT'S/FAMILY'S RESPONSE TO PLAN OF CARE: Pt's sister/guardian agreeable to look into SNF options, but plans to discuss further with MD this evening.       Ivan [name], [place]

## 2014-07-09 NOTE — Plan of Care (Signed)
Problem: Phase I Progression Outcomes Goal: Pain controlled with appropriate interventions Outcome: Progressing     

## 2014-07-09 NOTE — Clinical Social Work Placement (Signed)
[facility] NOTE 07/09/2014  Patient:  GADDIS, KOSER  Account Number:  [id number] Admit date:  07/07/2014  Clinical Social Worker:  [name], LCSW  Date/time:  07/09/2014 04:24 PM  Clinical Social Work is seeking post-discharge placement for this patient at the following level of care:   [facility]   (*CSW will update this form in Epic as items are completed)   07/09/2014  Patient/family provided with [facility] Department of Clinical Social Work's list of facilities offering this level of care within the geographic area requested by the patient (or if unable, by the patient's family).  07/09/2014  Patient/family informed of their freedom to choose among providers that offer the needed level of care, that participate in Medicare, Medicaid or managed care program needed by the patient, have an available bed and are willing to accept the patient.  07/09/2014  Patient/family informed of [facility]' ownership interest in [facility], as well as of the fact that they are under no obligation to receive care at this facility.  PASARR submitted to EDS on 07/08/2014 PASARR number received on 07/09/2014  FL2 transmitted to all facilities in geographic area requested by pt/family on  07/09/2014 FL2 transmitted to all facilities within larger geographic area on   Patient informed that his/her managed care company has contracts with or will negotiate with  certain facilities, including the following:     Patient/family informed of bed offers received:  07/09/2014 Patient chooses bed at [facility] Physician recommends and patient chooses bed at    Patient to be transferred to  on   Patient to be transferred to facility by  Patient and family notified of transfer on  Name of family member notified:    The following physician request were entered in Epic:   Additional Comments: 30 day pasarr.  [name], [place]
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
## 2014-07-09 NOTE — Plan of Care (Signed)
Problem: Phase I Progression Outcomes Goal: Voiding-avoid urinary catheter unless indicated Outcome: Progressing     

## 2014-07-10 LAB — GLUCOSE, CAPILLARY
GLUCOSE-CAPILLARY: 139 mg/dL — AB (ref 70–99)
GLUCOSE-CAPILLARY: 130 mg/dL — AB (ref 70–99)
Glucose-Capillary: 119 mg/dL — ABNORMAL HIGH (ref 70–99)

## 2014-07-10 LAB — LAMOTRIGINE LEVEL: Lamotrigine Lvl: 1 ug/mL — ABNORMAL LOW (ref 4.0–18.0)

## 2014-07-10 MED ORDER — HALOPERIDOL 10 MG PO TABS: 10.0000 mg | ORAL_TABLET | Freq: Every day | ORAL | Status: DC

## 2014-07-10 NOTE — Progress Notes (Signed)
EMS from [place] here to transport patient to [facility] via [facility].

## 2014-07-10 NOTE — Progress Notes (Signed)
Utilization Review completed.  
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
## 2014-07-10 NOTE — Progress Notes (Signed)
Report called to [facility] and discharge summary faxed. Saline lock removed. Notified patient sister regarding patient going to [facility]. [facility] notified regarding transportation.

## 2014-07-12 LAB — CULTURE, BLOOD (ROUTINE X 2)
CULTURE: NO GROWTH
Culture: NO GROWTH

## 2014-07-30 ENCOUNTER — Encounter (HOSPITAL_COMMUNITY): Payer: Self-pay | Admitting: Emergency Medicine

## 2014-07-30 ENCOUNTER — Emergency Department (HOSPITAL_COMMUNITY)
Admission: EM | Admit: 2014-07-30 | Discharge: 2014-07-30 | Disposition: A | Discharge status: Other Acute Inpatient Hospital | Payer: Medicare Other | Attending: Emergency Medicine | Admitting: Emergency Medicine

## 2014-07-30 DIAGNOSIS — H5711 Ocular pain, right eye: Secondary | ICD-10-CM | POA: Diagnosis present

## 2014-07-30 DIAGNOSIS — F259 Schizoaffective disorder, unspecified: Secondary | ICD-10-CM | POA: Insufficient documentation

## 2014-07-30 DIAGNOSIS — Z7982 Long term (current) use of aspirin: Secondary | ICD-10-CM | POA: Insufficient documentation

## 2014-07-30 DIAGNOSIS — Z79899 Other long term (current) drug therapy: Secondary | ICD-10-CM | POA: Insufficient documentation

## 2014-07-30 DIAGNOSIS — F319 Bipolar disorder, unspecified: Secondary | ICD-10-CM | POA: Diagnosis not present

## 2014-07-30 DIAGNOSIS — E119 Type 2 diabetes mellitus without complications: Secondary | ICD-10-CM | POA: Diagnosis not present

## 2014-07-30 DIAGNOSIS — H44001 Unspecified purulent endophthalmitis, right eye: Secondary | ICD-10-CM | POA: Insufficient documentation

## 2014-07-30 DIAGNOSIS — I10 Essential (primary) hypertension: Secondary | ICD-10-CM | POA: Insufficient documentation

## 2014-07-30 DIAGNOSIS — Z87828 Personal history of other (healed) physical injury and trauma: Secondary | ICD-10-CM | POA: Insufficient documentation

## 2014-07-30 DIAGNOSIS — E876 Hypokalemia: Secondary | ICD-10-CM | POA: Insufficient documentation

## 2014-07-30 DIAGNOSIS — Z720 Tobacco use: Secondary | ICD-10-CM | POA: Insufficient documentation

## 2014-07-30 MED ORDER — TETRACAINE HCL 0.5 % OP SOLN
1.0000 [drp] | Freq: Once | OPHTHALMIC | Status: DC
  Filled 2014-07-30: qty 2
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
## 2014-07-30 NOTE — ED Notes (Signed)
Unable to open eye for full assessment due to pain level and swelling.  Pt has decreased acuity in peripheral vision of right eye and decreased distance vision.  Pt sts pain has increased over past 2 days.  Has an appointment with Opthalmology, but could not get in until next week and could not withstand the pain.
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
## 2014-08-02 ENCOUNTER — Telehealth (HOSPITAL_COMMUNITY): Payer: Self-pay

## 2014-08-02 LAB — EYE CULTURE

## 2014-08-03 ENCOUNTER — Telehealth (HOSPITAL_BASED_OUTPATIENT_CLINIC_OR_DEPARTMENT_OTHER): Payer: Self-pay | Admitting: Emergency Medicine

## 2014-08-03 DIAGNOSIS — H26131 Total traumatic cataract, right eye: Secondary | ICD-10-CM | POA: Insufficient documentation

## 2014-08-03 DIAGNOSIS — H33001 Unspecified retinal detachment with retinal break, right eye: Secondary | ICD-10-CM | POA: Insufficient documentation

## 2014-08-03 DIAGNOSIS — H209 Unspecified iridocyclitis: Secondary | ICD-10-CM | POA: Insufficient documentation

## 2014-08-25 ENCOUNTER — Emergency Department (HOSPITAL_COMMUNITY): Payer: Medicare Other

## 2014-08-25 ENCOUNTER — Emergency Department (HOSPITAL_COMMUNITY)
Admission: EM | Admit: 2014-08-25 | Discharge: 2014-08-25 | Disposition: A | Discharge status: Home / Self Care | Payer: Medicare Other | Attending: Emergency Medicine | Admitting: Emergency Medicine

## 2014-08-25 ENCOUNTER — Encounter (HOSPITAL_COMMUNITY): Payer: Self-pay

## 2014-08-25 DIAGNOSIS — Z79899 Other long term (current) drug therapy: Secondary | ICD-10-CM | POA: Insufficient documentation

## 2014-08-25 DIAGNOSIS — Z720 Tobacco use: Secondary | ICD-10-CM | POA: Diagnosis not present

## 2014-08-25 DIAGNOSIS — Y998 Other external cause status: Secondary | ICD-10-CM | POA: Diagnosis not present

## 2014-08-25 DIAGNOSIS — Z7982 Long term (current) use of aspirin: Secondary | ICD-10-CM | POA: Diagnosis not present

## 2014-08-25 DIAGNOSIS — M1000 Idiopathic gout, unspecified site: Secondary | ICD-10-CM

## 2014-08-25 DIAGNOSIS — F259 Schizoaffective disorder, unspecified: Secondary | ICD-10-CM | POA: Insufficient documentation

## 2014-08-25 DIAGNOSIS — W1839XA Other fall on same level, initial encounter: Secondary | ICD-10-CM | POA: Insufficient documentation

## 2014-08-25 DIAGNOSIS — Y92091 Bathroom in other non-institutional residence as the place of occurrence of the external cause: Secondary | ICD-10-CM | POA: Insufficient documentation

## 2014-08-25 DIAGNOSIS — S8992XA Unspecified injury of left lower leg, initial encounter: Secondary | ICD-10-CM | POA: Diagnosis not present

## 2014-08-25 DIAGNOSIS — I10 Essential (primary) hypertension: Secondary | ICD-10-CM | POA: Diagnosis not present

## 2014-08-25 DIAGNOSIS — M10062 Idiopathic gout, left knee: Secondary | ICD-10-CM | POA: Insufficient documentation

## 2014-08-25 DIAGNOSIS — M25462 Effusion, left knee: Secondary | ICD-10-CM

## 2014-08-25 DIAGNOSIS — E119 Type 2 diabetes mellitus without complications: Secondary | ICD-10-CM | POA: Diagnosis not present

## 2014-08-25 DIAGNOSIS — Y9389 Activity, other specified: Secondary | ICD-10-CM | POA: Insufficient documentation

## 2014-08-25 DIAGNOSIS — F319 Bipolar disorder, unspecified: Secondary | ICD-10-CM | POA: Diagnosis not present

## 2014-08-25 DIAGNOSIS — W19XXXA Unspecified fall, initial encounter: Secondary | ICD-10-CM

## 2014-08-25 LAB — CBC WITH DIFFERENTIAL/PLATELET
Basophils Absolute: 0.1 10*3/uL (ref 0.0–0.1)
Basophils Relative: 1 % (ref 0–1)
Eosinophils Absolute: 0.2 10*3/uL (ref 0.0–0.7)
Eosinophils Relative: 2 % (ref 0–5)
HCT: 32.4 % — ABNORMAL LOW (ref 39.0–52.0)
Hemoglobin: 10.5 g/dL — ABNORMAL LOW (ref 13.0–17.0)
Lymphocytes Relative: 18 % (ref 12–46)
Lymphs Abs: 1.7 10*3/uL (ref 0.7–4.0)
MCH: 28.7 pg (ref 26.0–34.0)
MCHC: 32.4 g/dL (ref 30.0–36.0)
MCV: 88.5 fL (ref 78.0–100.0)
Monocytes Absolute: 1.1 10*3/uL — ABNORMAL HIGH (ref 0.1–1.0)
Monocytes Relative: 12 % (ref 3–12)
Neutro Abs: 6.4 10*3/uL (ref 1.7–7.7)
Neutrophils Relative %: 67 % (ref 43–77)
Platelets: 321 10*3/uL (ref 150–400)
RBC: 3.66 MIL/uL — ABNORMAL LOW (ref 4.22–5.81)
RDW: 16.2 % — ABNORMAL HIGH (ref 11.5–15.5)
WBC: 9.5 10*3/uL (ref 4.0–10.5)

## 2014-08-25 LAB — BASIC METABOLIC PANEL WITH GFR
Anion gap: 9 (ref 5–15)
BUN: 18 mg/dL (ref 6–23)
CO2: 26 mmol/L (ref 19–32)
Calcium: 8.9 mg/dL (ref 8.4–10.5)
Chloride: 99 meq/L (ref 96–112)
Creatinine, Ser: 1.65 mg/dL — ABNORMAL HIGH (ref 0.50–1.35)
GFR calc Af Amer: 54 mL/min — ABNORMAL LOW
GFR calc non Af Amer: 47 mL/min — ABNORMAL LOW
Glucose, Bld: 119 mg/dL — ABNORMAL HIGH (ref 70–99)
Potassium: 3.1 mmol/L — ABNORMAL LOW (ref 3.5–5.1)
Sodium: 134 mmol/L — ABNORMAL LOW (ref 135–145)

## 2014-08-25 LAB — URIC ACID: Uric Acid, Serum: 9.2 mg/dL — ABNORMAL HIGH (ref 4.0–7.8)

## 2014-08-25 MED ORDER — FENTANYL CITRATE 0.05 MG/ML IJ SOLN
50.0000 ug | Freq: Once | INTRAMUSCULAR | Status: AC
  Administered 2014-08-25: 50 ug via INTRAMUSCULAR
  Filled 2014-08-25: qty 2

## 2014-08-25 MED ORDER — COLCHICINE 0.6 MG PO TABS: 0.6000 mg | ORAL_TABLET | Freq: Every day | ORAL | Status: DC

## 2014-08-25 MED ORDER — COLCHICINE 0.6 MG PO TABS
0.6000 mg | ORAL_TABLET | Freq: Once | ORAL | Status: AC
  Administered 2014-08-25: 0.6 mg via ORAL
  Filled 2014-08-25: qty 1

## 2014-08-25 MED ORDER — HYDROCODONE-ACETAMINOPHEN 5-325 MG PO TABS: 1.0000 | ORAL_TABLET | Freq: Four times a day (QID) | ORAL | Status: DC | PRN

## 2014-08-25 NOTE — ED Notes (Signed)
Spoke with Anne Ng at [facility] to let them know that patient will be returning via EMS.

## 2014-08-25 NOTE — ED Notes (Signed)
No distress noted at time of discharge

## 2014-08-25 NOTE — ED Notes (Signed)
Pt is at [facility] for rehab for his left leg after being hospitalized for cellulitis.  Tonight, pt was trying to get to the bathroom, states he "slid down onto the floor"  But denies falling.  Pt states he couldn't get up from the floor due to pain in left leg and ems was called

## 2014-08-25 NOTE — Discharge Instructions (Signed)
Wear the knee immobilizer until you are rechecked by the orthopedist on call, Dr Luna Glasgow. Call his office to get an appointment to be seen. You may have had a "subluxation" of your knee cap, or the knee cap sliding out of place.  Take the colcrys for possible acute gout flare up. Talk to your primary care doctor about staying on gout medication.  Try ice and heat to see if that helps with your knee pain. Look at the [place] and try to follow that to help with your gout symptoms.

## 2014-08-25 NOTE — ED Provider Notes (Signed)
CSN: [id number]     Arrival date & time 08/25/14  0006 History   First MD Initiated Contact with Patient 08/25/14 0114     Chief Complaint  Patient presents with  . Leg Pain     (Consider location/radiation/quality/duration/timing/severity/associated sxs/prior Treatment) HPI  Patient was admitted to the hospital in early December for generalized weakness. He had a negative MR for acute stroke. He reports he has been in a nursing home getting physical therapy and has been doing well. He states he is to be discharged in 2 days. He states today he went to visit his home today. He states he got up to use the bathroom and his left knee buckled out on him and he slipped and fell backwards onto the floor. He states when he tried to get up his left leg was giving out at the knee. He thinks his kneecap may have been the problem. States he was unable to get up and ambulate. Patient states he supposed to be using a walker however he was not using his walker because he was in the house. PCP Dr Phylliss Bob none  Past Medical History  Diagnosis Date  . Diabetes mellitus   . Hypertension   . Bipolar 1 disorder   . Schizoaffective disorder   . Hypokalemia 04/02/2013  . Edema of both legs 04/07/2013  . Paranoid schizophrenia    Past Surgical History  Procedure Laterality Date  . Breast surgery    . Fracture surgery      ribs and arm   No family history on file. History  Substance Use Topics  . Smoking status: Current Every Day Smoker -- 0.50 packs/day for 39 years    Types: Cigarettes, Cigars  . Smokeless tobacco: Never Used  . Alcohol Use: No  normally lives at home and lives alone Currently in NH for PT for weakness in his legs Using a walker now  Review of Systems  All other systems reviewed and are negative.     Allergies  Bee venom  Home Medications   Prior to Admission medications   Medication Sig Start Date End Date Taking? Authorizing Provider  amLODipine (NORVASC)  5 MG tablet Take 1 tablet (5 mg total) by mouth 2 (two) times daily. 07/06/14   Elmarie Shiley, NP  [employer] [facility] Apply 1 drop to eye 2 (two) times daily.    Historical Provider, MD  aspirin EC 81 MG tablet Take 1 tablet (81 mg total) by mouth daily. 07/06/14   Elmarie Shiley, NP  benztropine (COGENTIN) 0.5 MG tablet Take 1 tablet (0.5 mg total) by mouth daily. 07/06/14   Elmarie Shiley, NP  cloNIDine (CATAPRES) 0.1 MG tablet Take 1 tablet (0.1 mg total) by mouth 2 (two) times daily. 07/06/14   Elmarie Shiley, NP  furosemide (LASIX) 40 MG tablet Take 1 tablet (40 mg total) by mouth 2 (two) times daily. 07/06/14   Elmarie Shiley, NP  gabapentin (NEURONTIN) 400 MG capsule Take 1 capsule (400 mg total) by mouth 3 (three) times daily. 07/06/14   Elmarie Shiley, NP  glipiZIDE (GLUCOTROL) 5 MG tablet Take 1 tablet (5 mg total) by mouth daily before breakfast. 07/06/14   Elmarie Shiley, NP  haloperidol (HALDOL) 10 MG tablet Take 1 tablet (10 mg total) by mouth daily. Patient taking differently: Take 10 mg by mouth every morning.  07/10/14   Erline Hau, MD  hydrALAZINE (APRESOLINE) 100 MG tablet Take 1 tablet (100 mg total) by mouth 3 (three)  times daily. 07/06/14   Elmarie Shiley, NP  labetalol (NORMODYNE) 200 MG tablet Take 1 tablet (200 mg total) by mouth 2 (two) times daily. 07/06/14   Elmarie Shiley, NP  lamoTRIgine (LAMICTAL) 100 MG tablet Take 1 tablet (100 mg total) by mouth daily. 07/06/14   Elmarie Shiley, NP  potassium chloride (K-DUR) 10 MEQ tablet Take 1 tablet (10 mEq total) by mouth 2 (two) times daily. Patient taking differently: Take 20 mEq by mouth 2 (two) times daily.  07/06/14   Elmarie Shiley, NP  sitaGLIPtin (JANUVIA) 100 MG tablet Take 1 tablet (100 mg total) by mouth daily. 07/06/14   Elmarie Shiley, NP  traZODone (DESYREL) 100 MG tablet Take 1 tablet (100 mg total) by mouth at bedtime. 07/06/14   Elmarie Shiley, NP   BP 158/90 mmHg  Pulse 74  Temp(Src) 98.8 F (37.1 C) (Oral)  Resp 20   Ht 6\' 3"  (1.905 m)  Wt 275 lb (124.739 kg)  BMI 34.37 kg/m2  SpO2 97%  Vital signs normal   Physical Exam  Constitutional: He is oriented to person, place, and time. He appears well-developed and well-nourished.  Non-toxic appearance. He does not appear ill. No distress.  HENT:  Head: Normocephalic and atraumatic.  Right Ear: External ear normal.  Left Ear: External ear normal.  Nose: Nose normal. No mucosal edema or rhinorrhea.  Mouth/Throat: Mucous membranes are normal. No dental abscesses or uvula swelling.  Neck: Full passive range of motion without pain.  Pulmonary/Chest: Effort normal. No respiratory distress. He has no rhonchi. He exhibits no crepitus.  Abdominal: Normal appearance.  Musculoskeletal: He exhibits edema and tenderness.  Patient's right knee is without swelling and is nontender. Patient's left knee is obviously swollen with what appears to be a moderate effusion. He has some pain to palpation. There are no abrasions noted. He is noted to have hyperpigmentation changes of the skin of both lower extremities between his knees and his ankles. There is no redness or swelling of his lower extremities.  Neurological: He is alert and oriented to person, place, and time. He has normal strength. No cranial nerve deficit.  Skin: Skin is warm, dry and intact. No rash noted. No erythema. No pallor.  Psychiatric: He has a normal mood and affect. His speech is normal and behavior is normal. His mood appears not anxious.  Nursing note and vitals reviewed.   ED Course  Procedures (including critical care time)  Medications  colchicine tablet 0.6 mg (not administered)  fentaNYL (SUBLIMAZE) injection 50 mcg (50 mcg Intramuscular Given 08/25/14 0202)    Patient was placed in a knee immobilizer. After reviewing his labs he was given colchicine for possible acute gouty arthritis. It is not clear whether he had a patellar subluxation or acute gouty arthritis flare from trauma or  combination of both. Patient states his pain is gone after being given the medications.  Labs Review Results for orders placed or performed during the hospital encounter of [id number]  Basic metabolic panel  Result Value Ref Range   Sodium 134 (L) 135 - 145 mmol/L   Potassium 3.1 (L) 3.5 - 5.1 mmol/L   Chloride 99 96 - 112 mEq/L   CO2 26 19 - 32 mmol/L   Glucose, Bld 119 (H) 70 - 99 mg/dL   BUN 18 6 - 23 mg/dL   Creatinine, Ser 1.65 (H) 0.50 - 1.35 mg/dL   Calcium 8.9 8.4 - 10.5 mg/dL   GFR calc non Af Amer 47 (L) >90 mL/min  GFR calc Af Amer 54 (L) >90 mL/min   Anion gap 9 5 - 15  CBC with Differential  Result Value Ref Range   WBC 9.5 4.0 - 10.5 K/uL   RBC 3.66 (L) 4.22 - 5.81 MIL/uL   Hemoglobin 10.5 (L) 13.0 - 17.0 g/dL   HCT 32.4 (L) 39.0 - 52.0 %   MCV 88.5 78.0 - 100.0 fL   MCH 28.7 26.0 - 34.0 pg   MCHC 32.4 30.0 - 36.0 g/dL   RDW 16.2 (H) 11.5 - 15.5 %   Platelets 321 150 - 400 K/uL   Neutrophils Relative % 67 43 - 77 %   Lymphocytes Relative 18 12 - 46 %   Monocytes Relative 12 3 - 12 %   Eosinophils Relative 2 0 - 5 %   Basophils Relative 1 0 - 1 %   Neutro Abs 6.4 1.7 - 7.7 K/uL   Lymphs Abs 1.7 0.7 - 4.0 K/uL   Monocytes Absolute 1.1 (H) 0.1 - 1.0 K/uL   Eosinophils Absolute 0.2 0.0 - 0.7 K/uL   Basophils Absolute 0.1 0.0 - 0.1 K/uL   WBC Morphology ATYPICAL LYMPHOCYTES   Uric acid  Result Value Ref Range   Uric Acid, Serum 9.2 (H) 4.0 - 7.8 mg/dL    Laboratory interpretation all normal except elevated uric acid, hypokalemia, anemia, stable renal insufficiency    Imaging Review Dg Knee Complete 4 Views Left  08/25/2014   CLINICAL DATA:  Slid to floor, with acute onset of left knee pain and swelling. Initial encounter.  EXAM: LEFT KNEE - COMPLETE 4+ VIEW  COMPARISON:  Left lower extremity CT performed 04/22/2013  FINDINGS: There is no evidence of fracture or dislocation. The joint spaces are preserved. No significant degenerative change is seen; the  patellofemoral joint is grossly unremarkable in appearance. A prominent exostosis is noted arising at the proximal medial metaphysis. An enthesophyte is noted arising at the upper pole of the patella.  A large knee joint effusion is noted. The visualized soft tissues are otherwise unremarkable in appearance.  IMPRESSION: 1. No evidence of fracture or dislocation. 2. Large knee joint effusion noted. If the patient's symptoms persist, MRI could be considered as deemed clinically appropriate to assess for internal derangement. 3. Exostosis again noted at the proximal medial metaphysis.   Electronically Signed   By: Garald Balding M.D.   On: 08/25/2014 02:04     EKG Interpretation None      MDM   Final diagnoses:  Fall  Swelling of knee joint, left  Effusion of knee, left  Idiopathic gout, unspecified chronicity, unspecified site    New Prescriptions   COLCHICINE 0.6 MG TABLET    Take 1 tablet (0.6 mg total) by mouth daily.   HYDROCODONE-ACETAMINOPHEN (NORCO/VICODIN) 5-325 MG PER TABLET    Take 1 tablet by mouth every 6 (six) hours as needed for moderate pain.    Plan discharge  Rolland Porter, MD, Alanson Aly, MD 08/25/14 [id number]

## 2014-08-25 NOTE — ED Notes (Signed)
Dr Knapp in to assess 

## 2014-11-25 ENCOUNTER — Encounter (HOSPITAL_COMMUNITY): Payer: Self-pay | Admitting: Emergency Medicine

## 2014-11-25 ENCOUNTER — Emergency Department (HOSPITAL_COMMUNITY)
Admission: EM | Admit: 2014-11-25 | Discharge: 2014-11-28 | Disposition: A | Discharge status: Other Acute Inpatient Hospital | Payer: Medicare Other | Attending: Emergency Medicine | Admitting: Emergency Medicine

## 2014-11-25 DIAGNOSIS — Z7982 Long term (current) use of aspirin: Secondary | ICD-10-CM | POA: Diagnosis not present

## 2014-11-25 DIAGNOSIS — Z046 Encounter for general psychiatric examination, requested by authority: Secondary | ICD-10-CM | POA: Diagnosis present

## 2014-11-25 DIAGNOSIS — F259 Schizoaffective disorder, unspecified: Secondary | ICD-10-CM | POA: Diagnosis not present

## 2014-11-25 DIAGNOSIS — E119 Type 2 diabetes mellitus without complications: Secondary | ICD-10-CM | POA: Insufficient documentation

## 2014-11-25 DIAGNOSIS — I10 Essential (primary) hypertension: Secondary | ICD-10-CM | POA: Insufficient documentation

## 2014-11-25 DIAGNOSIS — Z79899 Other long term (current) drug therapy: Secondary | ICD-10-CM | POA: Insufficient documentation

## 2014-11-25 DIAGNOSIS — F200 Paranoid schizophrenia: Secondary | ICD-10-CM | POA: Diagnosis not present

## 2014-11-25 DIAGNOSIS — F29 Unspecified psychosis not due to a substance or known physiological condition: Secondary | ICD-10-CM | POA: Diagnosis not present

## 2014-11-25 DIAGNOSIS — Z720 Tobacco use: Secondary | ICD-10-CM | POA: Insufficient documentation

## 2014-11-25 DIAGNOSIS — F319 Bipolar disorder, unspecified: Secondary | ICD-10-CM | POA: Insufficient documentation

## 2014-11-25 LAB — URINALYSIS, ROUTINE W REFLEX MICROSCOPIC
Bilirubin Urine: NEGATIVE
Glucose, UA: NEGATIVE mg/dL
Ketones, ur: NEGATIVE mg/dL
Leukocytes, UA: NEGATIVE
Nitrite: NEGATIVE
PROTEIN: 100 mg/dL — AB
Specific Gravity, Urine: 1.02 (ref 1.005–1.030)
Urobilinogen, UA: 0.2 mg/dL (ref 0.0–1.0)
pH: 6 (ref 5.0–8.0)

## 2014-11-25 LAB — CBC WITH DIFFERENTIAL/PLATELET
BASOS ABS: 0 10*3/uL (ref 0.0–0.1)
Basophils Relative: 1 % (ref 0–1)
EOS PCT: 3 % (ref 0–5)
Eosinophils Absolute: 0.2 10*3/uL (ref 0.0–0.7)
HEMATOCRIT: 33.5 % — AB (ref 39.0–52.0)
Hemoglobin: 11 g/dL — ABNORMAL LOW (ref 13.0–17.0)
LYMPHS PCT: 31 % (ref 12–46)
Lymphs Abs: 2 10*3/uL (ref 0.7–4.0)
MCH: 28.6 pg (ref 26.0–34.0)
MCHC: 32.8 g/dL (ref 30.0–36.0)
MCV: 87.2 fL (ref 78.0–100.0)
MONO ABS: 0.6 10*3/uL (ref 0.1–1.0)
MONOS PCT: 10 % (ref 3–12)
Neutro Abs: 3.6 10*3/uL (ref 1.7–7.7)
Neutrophils Relative %: 55 % (ref 43–77)
Platelets: 320 10*3/uL (ref 150–400)
RBC: 3.84 MIL/uL — ABNORMAL LOW (ref 4.22–5.81)
RDW: 14.6 % (ref 11.5–15.5)
WBC: 6.4 10*3/uL (ref 4.0–10.5)

## 2014-11-25 LAB — COMPREHENSIVE METABOLIC PANEL
ALBUMIN: 3.2 g/dL — AB (ref 3.5–5.2)
ALT: 26 U/L (ref 0–53)
AST: 48 U/L — ABNORMAL HIGH (ref 0–37)
Alkaline Phosphatase: 93 U/L (ref 39–117)
Anion gap: 7 (ref 5–15)
BUN: 22 mg/dL (ref 6–23)
CO2: 26 mmol/L (ref 19–32)
Calcium: 8.8 mg/dL (ref 8.4–10.5)
Chloride: 108 mmol/L (ref 96–112)
Creatinine, Ser: 1.53 mg/dL — ABNORMAL HIGH (ref 0.50–1.35)
GFR calc Af Amer: 59 mL/min — ABNORMAL LOW (ref >90)
GFR, EST NON AFRICAN AMERICAN: 51 mL/min — AB (ref >90)
Glucose, Bld: 103 mg/dL — ABNORMAL HIGH (ref 70–99)
POTASSIUM: 2.9 mmol/L — AB (ref 3.5–5.1)
Sodium: 141 mmol/L (ref 135–145)
TOTAL PROTEIN: 6.7 g/dL (ref 6.0–8.3)
Total Bilirubin: 0.3 mg/dL (ref 0.3–1.2)

## 2014-11-25 LAB — ETHANOL: Alcohol, Ethyl (B): <5 mg/dL (ref 0–9)

## 2014-11-25 LAB — URINE MICROSCOPIC-ADD ON

## 2014-11-25 LAB — ACETAMINOPHEN LEVEL: Acetaminophen (Tylenol), Serum: <10 ug/mL — ABNORMAL LOW (ref 10–30)

## 2014-11-25 LAB — SALICYLATE LEVEL

## 2014-11-25 LAB — RAPID URINE DRUG SCREEN, HOSP PERFORMED
Amphetamines: NOT DETECTED
Barbiturates: NOT DETECTED
Benzodiazepines: NOT DETECTED
Cocaine: NOT DETECTED
OPIATES: NOT DETECTED
Tetrahydrocannabinol: NOT DETECTED

## 2014-11-25 MED ORDER — COLCHICINE 0.6 MG PO TABS
0.6000 mg | ORAL_TABLET | Freq: Every day | ORAL | Status: DC
Start: 2014-11-25 — End: 2014-11-28
  Administered 2014-11-25 – 2014-11-28 (×4): 0.6 mg via ORAL
  Filled 2014-11-25 (×4): qty 1

## 2014-11-25 MED ORDER — LORAZEPAM 1 MG PO TABS: 1.0000 mg | ORAL_TABLET | Freq: Three times a day (TID) | ORAL | Status: DC | PRN

## 2014-11-25 MED ORDER — ONDANSETRON HCL 4 MG PO TABS: 4.0000 mg | ORAL_TABLET | Freq: Three times a day (TID) | ORAL | Status: DC | PRN

## 2014-11-25 MED ORDER — IBUPROFEN 400 MG PO TABS
600.0000 mg | ORAL_TABLET | Freq: Three times a day (TID) | ORAL | Status: DC | PRN
  Administered 2014-11-27: 600 mg via ORAL
  Filled 2014-11-25: qty 2

## 2014-11-25 MED ORDER — HALOPERIDOL 5 MG PO TABS
10.0000 mg | ORAL_TABLET | Freq: Every morning | ORAL | Status: DC
  Administered 2014-11-26 – 2014-11-28 (×3): 10 mg via ORAL
  Filled 2014-11-25 (×3): qty 2

## 2014-11-25 MED ORDER — BENZTROPINE MESYLATE 1 MG PO TABS
0.5000 mg | ORAL_TABLET | Freq: Every day | ORAL | Status: DC
  Administered 2014-11-25 – 2014-11-28 (×4): 0.5 mg via ORAL
  Filled 2014-11-25 (×4): qty 1

## 2014-11-25 MED ORDER — LINAGLIPTIN 5 MG PO TABS
5.0000 mg | ORAL_TABLET | Freq: Every day | ORAL | Status: DC
  Administered 2014-11-25 – 2014-11-28 (×4): 5 mg via ORAL
  Filled 2014-11-25 (×6): qty 1

## 2014-11-25 MED ORDER — POTASSIUM CHLORIDE CRYS ER 20 MEQ PO TBCR
20.0000 meq | EXTENDED_RELEASE_TABLET | Freq: Two times a day (BID) | ORAL | Status: DC
  Administered 2014-11-26 – 2014-11-28 (×5): 20 meq via ORAL
  Filled 2014-11-25: qty 1
  Filled 2014-11-25 (×4): qty 2

## 2014-11-25 MED ORDER — POTASSIUM CHLORIDE CRYS ER 20 MEQ PO TBCR
40.0000 meq | EXTENDED_RELEASE_TABLET | Freq: Once | ORAL | Status: AC
  Administered 2014-11-25: 40 meq via ORAL
  Filled 2014-11-25: qty 2

## 2014-11-25 MED ORDER — GABAPENTIN 400 MG PO CAPS
400.0000 mg | ORAL_CAPSULE | Freq: Three times a day (TID) | ORAL | Status: DC
  Administered 2014-11-25 – 2014-11-28 (×8): 400 mg via ORAL
  Filled 2014-11-25 (×9): qty 1

## 2014-11-25 MED ORDER — CLONIDINE HCL 0.1 MG PO TABS
0.1000 mg | ORAL_TABLET | Freq: Two times a day (BID) | ORAL | Status: DC
  Administered 2014-11-26 – 2014-11-28 (×4): 0.1 mg via ORAL
  Filled 2014-11-25 (×6): qty 1

## 2014-11-25 MED ORDER — ALUM & MAG HYDROXIDE-SIMETH 200-200-20 MG/5ML PO SUSP: 30.0000 mL | ORAL | Status: DC | PRN

## 2014-11-25 MED ORDER — LABETALOL HCL 200 MG PO TABS
200.0000 mg | ORAL_TABLET | Freq: Two times a day (BID) | ORAL | Status: DC
  Administered 2014-11-26 – 2014-11-28 (×4): 200 mg via ORAL
  Filled 2014-11-25 (×5): qty 1

## 2014-11-25 MED ORDER — LAMOTRIGINE 100 MG PO TABS
100.0000 mg | ORAL_TABLET | Freq: Every day | ORAL | Status: DC
  Administered 2014-11-25 – 2014-11-28 (×4): 100 mg via ORAL
  Filled 2014-11-25 (×6): qty 1

## 2014-11-25 MED ORDER — FUROSEMIDE 40 MG PO TABS
40.0000 mg | ORAL_TABLET | Freq: Two times a day (BID) | ORAL | Status: DC
  Administered 2014-11-25 – 2014-11-28 (×5): 40 mg via ORAL
  Filled 2014-11-25 (×5): qty 1

## 2014-11-25 MED ORDER — HYDRALAZINE HCL 25 MG PO TABS
100.0000 mg | ORAL_TABLET | Freq: Three times a day (TID) | ORAL | Status: DC
  Administered 2014-11-25 – 2014-11-28 (×6): 100 mg via ORAL
  Filled 2014-11-25 (×13): qty 4

## 2014-11-25 MED ORDER — ASPIRIN EC 81 MG PO TBEC
81.0000 mg | DELAYED_RELEASE_TABLET | Freq: Every day | ORAL | Status: DC
  Administered 2014-11-25 – 2014-11-28 (×4): 81 mg via ORAL
  Filled 2014-11-25 (×4): qty 1

## 2014-11-25 MED ORDER — GLIPIZIDE 5 MG PO TABS
5.0000 mg | ORAL_TABLET | Freq: Every day | ORAL | Status: DC
  Administered 2014-11-26 – 2014-11-28 (×3): 5 mg via ORAL
  Filled 2014-11-25 (×5): qty 1

## 2014-11-25 MED ORDER — AMLODIPINE BESYLATE 5 MG PO TABS
5.0000 mg | ORAL_TABLET | Freq: Two times a day (BID) | ORAL | Status: DC
  Administered 2014-11-26 – 2014-11-28 (×3): 5 mg via ORAL
  Filled 2014-11-25 (×5): qty 1

## 2014-11-25 MED ORDER — TRAZODONE HCL 50 MG PO TABS
100.0000 mg | ORAL_TABLET | Freq: Every day | ORAL | Status: DC
  Administered 2014-11-26: 100 mg via ORAL
  Filled 2014-11-25 (×2): qty 2

## 2014-11-25 NOTE — ED Notes (Signed)
Having back pain for one week.  Rates pain 9/10.  Denies back injury.  Was seen at [facility] today.  Had xrays and prescriptions.  Sister came in and filled out IVC papers.  Pt denies HI and SI.  Sheriff x2 here with pt in hand cuffs.  Sheriff says pt has not voice HI or SI.

## 2014-11-25 NOTE — ED Notes (Signed)
Patient arrives via [facility] with IVC paperwork. IVC states patient has been wandering stores, calling father. States he is not taking care of himself at home. No indorsing HI/SI.

## 2014-11-25 NOTE — ED Notes (Signed)
Patient reluctant to take potassium pills due to taste. States "I was doing just fine before you tried to push all these pills on me". RN explained to patient need for medication due to hypokalemia. Patient did take medication with RN given strong encouragement. Patient states he would not take anymore after initial dose.

## 2014-11-25 NOTE — ED Notes (Addendum)
Pt refusing to take his night time medications. Chare nurse and dr ward notified

## 2014-11-25 NOTE — BH Assessment (Addendum)
Tele Assessment Note   Ivan [name] is an 54 y.o. male who was brought to the emergency department by his sister who IVC'd him due to bizarre behavior and possible psychosis. When assessed by writer pt refused to answer any questions, was irritable and angry and stated "I just came here for my back pain, shoulder pain and I'm restless, that's all I'm saying". Collateral information was gathered from sister who states that he has a long psychiatric history of psychosis and has been hospitalized multiple times. Sister is his legal guardian but he does live in a house alone. She states that the last time he was hospitalized was in November 2015 at [facility] and when he left he "was not able to walk". She states that they went to doctors and specialists to try an find a physical problem and they could not find one. She got him into a rehabilitation facility called [facility] from November to February to help him learn how to walk again. She states that at this facility he developed an issue with his eye and he lost vision in one eye.   Sister states that she is concerned about his recent behavior because he has been living alone and she is not sure if he has been med compliant. She states that she feels like he is having a psychotic episode. He has been angry and irritable with tangential speech. Sister states that he has been calling her more frequently at all hours even at midnight and will hang up when she starts talking to him. Pt exhibits bizarre behavior and sister is concerned that he might hurt himself or someone else.   Per Dr. Nicole Cella- Inpatient stabilization is recommended.   Axis I: 295.70 Schizoaffective disorder, bipolar type Axis II: Deferred Axis III:  Past Medical History  Diagnosis Date  . Diabetes mellitus   . Hypertension   . Bipolar 1 disorder   . Schizoaffective disorder   . Hypokalemia 04/02/2013  . Edema of both legs 04/07/2013  . Paranoid schizophrenia    Axis IV: other  psychosocial or environmental problems and problems related to social environment Axis V: 21-30 behavior considerably influenced by delusions or hallucinations OR serious impairment in judgment, communication OR inability to function in almost all areas  Past Medical History:  Past Medical History  Diagnosis Date  . Diabetes mellitus   . Hypertension   . Bipolar 1 disorder   . Schizoaffective disorder   . Hypokalemia 04/02/2013  . Edema of both legs 04/07/2013  . Paranoid schizophrenia     Past Surgical History  Procedure Laterality Date  . Breast surgery    . Fracture surgery      ribs and arm    Family History: History reviewed. No pertinent family history.  Social History:  reports that he has been smoking Cigarettes and Cigars.  He has a 19.5 pack-year smoking history. He has never used smokeless tobacco. He reports that he does not drink alcohol or use illicit drugs.  Additional Social History:  Alcohol / Drug Use History of alcohol / drug use?:  (UTA)  CIWA: CIWA-Ar BP: 177/97 mmHg Pulse Rate: 71 COWS:    PATIENT STRENGTHS: (choose at least two) Average or above average intelligence General fund of knowledge  Allergies:  Allergies  Allergen Reactions  . Bee Venom Swelling    Home Medications:  (Not in a hospital admission)  OB/GYN Status:  No LMP for male patient.  General Assessment Data Location of Assessment: [facility] ED Is  this a Tele or Face-to-Face Assessment?: Tele Assessment Is this an Initial Assessment or a Re-assessment for this encounter?: Initial Assessment Living Arrangements: Other (Comment) (group home) Can pt return to current living arrangement?:  (unsure) Admission Status: Involuntary Is patient capable of signing voluntary admission?: No Transfer from:  (Group Home) Referral Source: Self/Family/Friend     [place] Living Arrangements: Other (Comment) (group home) Name of Psychiatrist: [place] Name of Therapist:  UTA  Education Status Is patient currently in school?: No Highest grade of school patient has completed: UTA  Risk to self with the past 6 months Suicidal Ideation:  (UTA) Suicidal Intent:  (UTA) Is patient at risk for suicide?:  (UTA) Suicidal Plan?:  (UTA) Access to Means:  (UTA) What has been your use of drugs/alcohol within the last 12 months?:  (UTA) Previous Attempts/Gestures:  (UTA) How many times?:  (UTA) Other Self Harm Risks:  (UTA) Triggers for Past Attempts:  (UTA) Intentional Self Injurious Behavior:  (UTA) Family Suicide History:  (UTA) Recent stressful life event(s):  (UTA) Persecutory voices/beliefs?:  (UTA) Depression:  (UTA) Depression Symptoms:  (UTA) Substance abuse history and/or treatment for substance abuse?: No Suicide prevention information given to non-admitted patients:  (UTA)  Risk to Others within the past 6 months Homicidal Ideation:  (UTA) Thoughts of Harm to Others:  (UTA) Current Homicidal Intent:  (UTA) Current Homicidal Plan:  (UTA) Access to Homicidal Means:  (UTA) Identified Victim:  (UTA) History of harm to others?:  (UTA) Assessment of Violence:  (UTA) Violent Behavior Description:  (UTA) Does patient have access to weapons?:  (UTA) Criminal Charges Pending?:  (UTA) Does patient have a court date:  (UTA)  Psychosis Hallucinations:  (UTA) Delusions:  (UTA)  Mental Status Report Appearance/Hygiene: In scrubs Eye Contact: Poor Motor Activity: Freedom of movement Speech: Aggressive, Argumentative Level of Consciousness: Alert Mood: Irritable Affect: Angry Anxiety Level: None Thought Processes: Coherent Judgement: Unable to Assess Orientation: Unable to assess Obsessive Compulsive Thoughts/Behaviors: Unable to Assess  Cognitive Functioning Concentration: Fair Memory: Recent Intact, Remote Intact IQ: Average Insight: Poor Impulse Control: Unable to Assess Appetite:  (UTA) Sleep: Unable to Assess  ADLScreening [facility]  Assessment Services) Patient's cognitive ability adequate to safely complete daily activities?: Yes Patient able to express need for assistance with ADLs?: Yes Independently performs ADLs?: Yes (appropriate for developmental age)  Prior Inpatient Therapy Prior Inpatient Therapy: Yes Prior Therapy Dates: 11/15 Prior Therapy Facilty/Provider(s): [facility] Reason for Treatment: Psychosis   Prior Outpatient Therapy Prior Outpatient Therapy: No  ADL Screening (condition at time of admission) Patient's cognitive ability adequate to safely complete daily activities?: Yes Is the patient deaf or have difficulty hearing?: No Does the patient have difficulty seeing, even when wearing glasses/contacts?: No Does the patient have difficulty concentrating, remembering, or making decisions?: No Patient able to express need for assistance with ADLs?: Yes Does the patient have difficulty dressing or bathing?: No Independently performs ADLs?: Yes (appropriate for developmental age) Does the patient have difficulty walking or climbing stairs?: No Weakness of Legs: None Weakness of Arms/Hands: None  Home Assistive Devices/Equipment Home Assistive Devices/Equipment: None  Therapy Consults (therapy consults require a physician order) PT Evaluation Needed: No OT Evalulation Needed: No SLP Evaluation Needed: No Abuse/Neglect Assessment (Assessment to be complete while patient is alone) Physical Abuse:  (UTA) Verbal Abuse:  (UTA) Sexual Abuse:  (UTA) Exploitation of patient/patient's resources:  (UTA) Self-Neglect:  (UTA) Possible abuse reported to::  (UTA) Values / Beliefs Cultural Requests During Hospitalization: None Spiritual  Requests During Hospitalization: None Consults Spiritual Care Consult Needed: No Social Work Consult Needed: No Regulatory affairs officer (For Healthcare) Does patient have an advance directive?:  (UTA) Would patient like information on creating an advanced directive?: No - patient  declined information    Additional Information 1:1 In Past 12 Months?: No CIRT Risk: Yes Elopement Risk: Yes Does patient have medical clearance?: Yes     Disposition:  Disposition Initial Assessment Completed for this Encounter: Yes  Sylva Overley 11/25/2014 5:00 PM

## 2014-11-25 NOTE — Progress Notes (Addendum)
Patient referral under review at the following hospitals: [facility], [facility], [facility], [facility], [facility], and [facility].  [facility] - no answer. Per [facility], referral received, will have an answer in the am due to insurance. [facility] - per [facility], can fax it. Per [facility], call later to follow up. [facility] - fax referral. Also, adolescent beds and adult beds. Vocemail at follow-up. [facility] - per Elvera Maria, fax referral. Referral received per Jana Half. 11p writer faxed referral to: [facility] - yes, fax referral. [facility] - have a couple beds, fax referral.  [facility] - voicemail  Declined at: [facility] due to chronicity (per Caryl Pina)  At capacity: [facility] - just 1 detox bed. [facility] - beds only  on the geriatric medical unit. [facility] beds only for under 23 y.o.  CSW will continue to seek placement.  Verlon Setting, [place] Disposition staff 11/25/2014 7:41 PM
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
## 2014-11-25 NOTE — ED Notes (Signed)
Patient belongings consist of a pair of black socks, black shoes, blue jeans, blue and white coat, tan belt, black and tan shirt, blue flashlight, a pack of cigs, assortment of keys,and a black hat. Change and Wallet were given to Security. Belongings have been taken and locked into the lockers in the EMS room.
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
## 2014-11-26 DIAGNOSIS — F200 Paranoid schizophrenia: Secondary | ICD-10-CM | POA: Diagnosis not present

## 2014-11-26 LAB — CBG MONITORING, ED: Glucose-Capillary: 122 mg/dL — ABNORMAL HIGH (ref 70–99)

## 2014-11-26 MED ORDER — AMLODIPINE BESYLATE 5 MG PO TABS
ORAL_TABLET | ORAL | Status: AC
  Administered 2014-11-26: 22:00:00
  Filled 2014-11-26: qty 1

## 2014-11-26 NOTE — Progress Notes (Addendum)
Writer completed and faxed [facility] referral. Authorization [id number] from 11/26/14 through 12/02/14 (7 days), per Elta Guadeloupe at [facility]. Clinical Screening Information Form, labs, and Dr's treatment plan were included in the [facility] referral packet.    CSW gave verbal intake to [place] at [facility].  Verlon Setting, [place] Disposition staff 11/26/2014 10:28 PM

## 2014-11-26 NOTE — ED Notes (Signed)
Pt refused to talk to telepsych.

## 2014-11-26 NOTE — ED Notes (Addendum)
Pt alert, pleasant, and cooperative.  Pt ate breakfast.  0800 meds given.  Pt wanting to take a shower.  Notified security and they will stand by shower.  Informed pt, waiting for security.  Pt verbalized understanding.  Sitter at bedside.

## 2014-11-26 NOTE — ED Notes (Signed)
Pt. Resting in bed. Pt. Calm and cooperative at this time.

## 2014-11-26 NOTE — ED Notes (Signed)
Pt. Given toothbrush and toothpaste.

## 2014-11-26 NOTE — Progress Notes (Signed)
Steward at [facility] requested guardianship papers for pt's sister to be faxed. Nurse Dewaine Oats informed Probation officer that [facility] should have papers of guardianship.  TTS aware.  Verlon Setting, [place] Disposition staff 11/26/2014 11:36 PM

## 2014-11-26 NOTE — ED Notes (Signed)
Pt took medications without difficulty.  Resting.  Awaiting telepsych.  Sitter at bedside.

## 2014-11-26 NOTE — ED Notes (Signed)
Pt. Ambulatory to bathroom. 
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
## 2014-11-26 NOTE — Progress Notes (Addendum)
Spoke with Judson Roch ([facility] liaison) regarding pt's case. Judson Roch states she had facilitated approval for ACTT services around 2 months ago despite his [facility] coverage not typically being accepted for ACTT. States sister states they made an appt for ACTT assessment and canceled. She states she is following up with sister encouraging her to make another appt.  Sarah requests she be contacted when pt is placed or discharged. [phone]).  CSW working to seek placement.   Currently, pt is under review at: [place] at: [facility] (due to chronicity) [facility] (due to recent ambulatory issues per records) [facility] (acuity too high for units)  Will continue seeking placement.   Sharren Bridge, MSW, [place] Clinical Social Work, Disposition  11/26/2014 [phone]

## 2014-11-26 NOTE — ED Notes (Signed)
Pt drop first Norvasc 5mg , overrode and gave second 5mg  Norvasc pill.

## 2014-11-26 NOTE — ED Notes (Signed)
Pt. Given drink and crackers. Explained to pt. That his BP high because he did not take his BP medication last night. Encouraged pt. To take BP medication this morning. Pt. Agreeable to take medication this morning. Pt. Calm and resting in bed.

## 2014-11-27 DIAGNOSIS — F200 Paranoid schizophrenia: Secondary | ICD-10-CM | POA: Diagnosis not present

## 2014-11-27 LAB — CBG MONITORING, ED: GLUCOSE-CAPILLARY: 103 mg/dL — AB (ref 70–99)

## 2014-11-27 MED ORDER — POTASSIUM CHLORIDE CRYS ER 20 MEQ PO TBCR
EXTENDED_RELEASE_TABLET | ORAL | Status: AC
  Administered 2014-11-27: 20 meq via ORAL
  Filled 2014-11-27: qty 1

## 2014-11-27 NOTE — ED Notes (Signed)
Pt. Resting in bed with eyes closed. Even rise and fall of chest noted. No acute distress noted.

## 2014-11-27 NOTE — ED Notes (Signed)
Dinner tray given to pt, sitter remains at bedside,

## 2014-11-27 NOTE — ED Notes (Signed)
Received report on pt, pt alert, lying on stretcher, sitter remains at bedside,

## 2014-11-27 NOTE — ED Notes (Signed)
telepsych being performed at present time,
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
## 2014-11-27 NOTE — ED Notes (Signed)
Patient resting in bed on right side with eyes closed.  Respirations even and unlabored; equal rise and fall of chest noted.  Sitter remains at bedside; will continue to monitor.

## 2014-11-27 NOTE — ED Notes (Signed)
Patient given crackers and ginger ale per request. 

## 2014-11-27 NOTE — ED Notes (Signed)
Pt only ate a few bites of lunch, resting on side with sitter at bedside,

## 2014-11-27 NOTE — Progress Notes (Signed)
CSW continued inpatient placement for patient.   CSW followed up on previous referrals:  [facility]: Reported they did not receive the fax.  CSW refaxed referral.   [place] [place]: Nelly Rout reported they are currently attempting to verify his medicare to see if he has any hospital days left.  He is currently pending for review due to this. [place]: Diane deny due to acuity [place]: No Beds (Several referrals already to review) [place]: Reported have not reviewed referral and reports will not be taking any more patients until Monday.  At Capacity:  [place]  [place]  Denied: [place] (high acuity) [place] (high acuity)   Previous Denials: [facility] [place]

## 2014-11-27 NOTE — BHH Counselor (Signed)
Faxed guardianship paperwork to [facility].  Orpah Greek Rosana Hoes, [facility] Triage Specialist [phone]

## 2014-11-28 ENCOUNTER — Encounter (HOSPITAL_COMMUNITY): Payer: Self-pay | Admitting: Behavioral Health

## 2014-11-28 ENCOUNTER — Inpatient Hospital Stay (HOSPITAL_COMMUNITY)
Admission: AD | Admit: 2014-11-28 | Discharge: 2014-12-03 | DRG: 885 | Disposition: A | Discharge status: Home / Self Care | Payer: 59 | Source: Intra-hospital | Attending: Psychiatry | Admitting: Psychiatry

## 2014-11-28 DIAGNOSIS — G894 Chronic pain syndrome: Secondary | ICD-10-CM | POA: Diagnosis present

## 2014-11-28 DIAGNOSIS — F17210 Nicotine dependence, cigarettes, uncomplicated: Secondary | ICD-10-CM | POA: Diagnosis present

## 2014-11-28 DIAGNOSIS — F203 Undifferentiated schizophrenia: Principal | ICD-10-CM | POA: Diagnosis present

## 2014-11-28 DIAGNOSIS — E1121 Type 2 diabetes mellitus with diabetic nephropathy: Secondary | ICD-10-CM | POA: Diagnosis present

## 2014-11-28 DIAGNOSIS — N183 Chronic kidney disease, stage 3 (moderate): Secondary | ICD-10-CM | POA: Diagnosis present

## 2014-11-28 DIAGNOSIS — F319 Bipolar disorder, unspecified: Secondary | ICD-10-CM | POA: Diagnosis present

## 2014-11-28 DIAGNOSIS — Z9114 Patient's other noncompliance with medication regimen: Secondary | ICD-10-CM | POA: Diagnosis present

## 2014-11-28 DIAGNOSIS — I129 Hypertensive chronic kidney disease with stage 1 through stage 4 chronic kidney disease, or unspecified chronic kidney disease: Secondary | ICD-10-CM | POA: Diagnosis present

## 2014-11-28 DIAGNOSIS — F29 Unspecified psychosis not due to a substance or known physiological condition: Secondary | ICD-10-CM | POA: Diagnosis not present

## 2014-11-28 LAB — GLUCOSE, CAPILLARY
Glucose-Capillary: 132 mg/dL — ABNORMAL HIGH (ref 70–99)
Glucose-Capillary: 86 mg/dL (ref 70–99)
Glucose-Capillary: 126 mg/dL — ABNORMAL HIGH (ref 70–99)

## 2014-11-28 LAB — CBG MONITORING, ED: Glucose-Capillary: 137 mg/dL — ABNORMAL HIGH (ref 70–99)

## 2014-11-28 MED ORDER — HYDRALAZINE HCL 50 MG PO TABS
100.0000 mg | ORAL_TABLET | Freq: Three times a day (TID) | ORAL | Status: DC
  Administered 2014-11-28 – 2014-12-03 (×15): 100 mg via ORAL
  Filled 2014-11-28 (×6): qty 2
  Filled 2014-11-28: qty 4
  Filled 2014-11-28 (×14): qty 2

## 2014-11-28 MED ORDER — LINAGLIPTIN 5 MG PO TABS
5.0000 mg | ORAL_TABLET | Freq: Every day | ORAL | Status: DC
  Administered 2014-11-29 – 2014-12-03 (×5): 5 mg via ORAL
  Filled 2014-11-28 (×7): qty 1

## 2014-11-28 MED ORDER — INSULIN ASPART 100 UNIT/ML ~~LOC~~ SOLN: 6.0000 [IU] | Freq: Three times a day (TID) | SUBCUTANEOUS | Status: DC

## 2014-11-28 MED ORDER — ASPIRIN EC 81 MG PO TBEC
81.0000 mg | DELAYED_RELEASE_TABLET | Freq: Every day | ORAL | Status: DC
  Administered 2014-11-29 – 2014-12-03 (×5): 81 mg via ORAL
  Filled 2014-11-28 (×7): qty 1

## 2014-11-28 MED ORDER — FUROSEMIDE 40 MG PO TABS
40.0000 mg | ORAL_TABLET | Freq: Two times a day (BID) | ORAL | Status: DC
  Administered 2014-11-28 – 2014-12-03 (×10): 40 mg via ORAL
  Filled 2014-11-28 (×6): qty 1
  Filled 2014-11-28: qty 2
  Filled 2014-11-28 (×8): qty 1

## 2014-11-28 MED ORDER — CLONIDINE HCL 0.1 MG PO TABS
0.1000 mg | ORAL_TABLET | Freq: Two times a day (BID) | ORAL | Status: DC
  Administered 2014-11-28 – 2014-12-03 (×10): 0.1 mg via ORAL
  Filled 2014-11-28 (×16): qty 1

## 2014-11-28 MED ORDER — HYDROXYZINE HCL 25 MG PO TABS
25.0000 mg | ORAL_TABLET | Freq: Four times a day (QID) | ORAL | Status: DC | PRN
  Administered 2014-11-28 – 2014-11-30 (×3): 25 mg via ORAL
  Filled 2014-11-28 (×3): qty 1
  Filled 2014-11-28: qty 6

## 2014-11-28 MED ORDER — TRAZODONE HCL 100 MG PO TABS
100.0000 mg | ORAL_TABLET | Freq: Every day | ORAL | Status: DC
  Administered 2014-11-28 – 2014-12-02 (×4): 100 mg via ORAL
  Filled 2014-11-28 (×7): qty 1
  Filled 2014-11-28: qty 14
  Filled 2014-11-28: qty 1

## 2014-11-28 MED ORDER — ALLOPURINOL 300 MG PO TABS
300.0000 mg | ORAL_TABLET | Freq: Every day | ORAL | Status: DC
  Administered 2014-11-28 – 2014-12-03 (×6): 300 mg via ORAL
  Filled 2014-11-28 (×10): qty 1

## 2014-11-28 MED ORDER — GABAPENTIN 400 MG PO CAPS
400.0000 mg | ORAL_CAPSULE | Freq: Three times a day (TID) | ORAL | Status: DC
  Administered 2014-11-28 – 2014-12-03 (×15): 400 mg via ORAL
  Filled 2014-11-28: qty 9
  Filled 2014-11-28 (×11): qty 1
  Filled 2014-11-28: qty 9
  Filled 2014-11-28 (×5): qty 1
  Filled 2014-11-28: qty 9
  Filled 2014-11-28 (×2): qty 1

## 2014-11-28 MED ORDER — INSULIN ASPART 100 UNIT/ML ~~LOC~~ SOLN: 0.0000 [IU] | Freq: Three times a day (TID) | SUBCUTANEOUS | Status: DC

## 2014-11-28 MED ORDER — NICOTINE POLACRILEX 2 MG MT GUM
2.0000 mg | CHEWING_GUM | OROMUCOSAL | Status: DC | PRN
  Administered 2014-11-28 – 2014-12-03 (×12): 2 mg via ORAL
  Filled 2014-11-28 (×3): qty 1

## 2014-11-28 MED ORDER — AMLODIPINE BESYLATE 5 MG PO TABS
5.0000 mg | ORAL_TABLET | Freq: Two times a day (BID) | ORAL | Status: DC
  Administered 2014-11-28 – 2014-12-03 (×10): 5 mg via ORAL
  Filled 2014-11-28 (×17): qty 1

## 2014-11-28 MED ORDER — MAGNESIUM HYDROXIDE 400 MG/5ML PO SUSP
30.0000 mL | Freq: Every day | ORAL | Status: DC | PRN
Start: 2014-11-28 — End: 2014-12-03

## 2014-11-28 MED ORDER — LABETALOL HCL 200 MG PO TABS
200.0000 mg | ORAL_TABLET | Freq: Two times a day (BID) | ORAL | Status: DC
  Administered 2014-11-28 – 2014-12-03 (×10): 200 mg via ORAL
  Filled 2014-11-28 (×8): qty 1
  Filled 2014-11-28: qty 2
  Filled 2014-11-28 (×6): qty 1

## 2014-11-28 MED ORDER — ALUM & MAG HYDROXIDE-SIMETH 200-200-20 MG/5ML PO SUSP: 30.0000 mL | ORAL | Status: DC | PRN

## 2014-11-28 MED ORDER — POTASSIUM CHLORIDE CRYS ER 20 MEQ PO TBCR
20.0000 meq | EXTENDED_RELEASE_TABLET | Freq: Once | ORAL | Status: AC
Start: 2014-11-28 — End: 2014-11-28

## 2014-11-28 MED ORDER — NICOTINE 21 MG/24HR TD PT24
21.0000 mg | MEDICATED_PATCH | Freq: Every day | TRANSDERMAL | Status: DC
  Filled 2014-11-28: qty 1

## 2014-11-28 MED ORDER — ACETAMINOPHEN 325 MG PO TABS
650.0000 mg | ORAL_TABLET | Freq: Four times a day (QID) | ORAL | Status: DC | PRN
  Administered 2014-11-30 – 2014-12-02 (×8): 650 mg via ORAL
  Filled 2014-11-28 (×8): qty 2

## 2014-11-28 MED ORDER — POTASSIUM CHLORIDE ER 10 MEQ PO TBCR
10.0000 meq | EXTENDED_RELEASE_TABLET | Freq: Two times a day (BID) | ORAL | Status: DC
  Administered 2014-11-28 – 2014-12-03 (×10): 10 meq via ORAL
  Filled 2014-11-28 (×15): qty 1

## 2014-11-28 NOTE — ED Notes (Signed)
Pt given juice and crackers per request 

## 2014-11-28 NOTE — ED Notes (Signed)
IVC papers faxed to [facility] for review and possible placement at [facility].
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
## 2014-11-28 NOTE — ED Notes (Signed)
Pharmacy notified for meds.

## 2014-11-28 NOTE — Tx Team (Signed)
Initial Interdisciplinary Treatment Plan   PATIENT STRESSORS: Marital or family conflict   PATIENT STRENGTHS: Ability for insight Capable of independent living   PROBLEM LIST: Problem List/Patient Goals Date to be addressed Date deferred Reason deferred Estimated date of resolution  Pt would not specify any problems on admit 11/28/14                                                      DISCHARGE CRITERIA:  Ability to meet basic life and health needs Adequate post-discharge living arrangements Improved stabilization in mood, thinking, and/or behavior Verbal commitment to aftercare and medication compliance  PRELIMINARY DISCHARGE PLAN: Attend aftercare/continuing care group Attend PHP/IOP  PATIENT/FAMIILY INVOLVEMENT: This treatment plan has been presented to and reviewed with the patient, Ivan [name], and/or family member.  The patient and family have been given the opportunity to ask questions and make suggestions.  Ivan [name] L 11/28/2014, 4:11 PM

## 2014-11-28 NOTE — Progress Notes (Signed)
Did not attend group 

## 2014-11-28 NOTE — ED Notes (Signed)
Pt. Ambulatory to bathroom. 

## 2014-11-28 NOTE — Progress Notes (Signed)
Per Levada Dy at [facility], [facility] remains on waitlist.  Peri Maris, [facility] 11/28/2014 8:33 AM

## 2014-11-29 ENCOUNTER — Encounter (HOSPITAL_COMMUNITY): Payer: Self-pay | Admitting: Psychiatry

## 2014-11-29 DIAGNOSIS — F203 Undifferentiated schizophrenia: Secondary | ICD-10-CM | POA: Diagnosis present

## 2014-11-29 LAB — BASIC METABOLIC PANEL
ANION GAP: 8 (ref 5–15)
BUN: 22 mg/dL (ref 6–23)
CALCIUM: 9 mg/dL (ref 8.4–10.5)
CO2: 28 mmol/L (ref 19–32)
Chloride: 105 mmol/L (ref 96–112)
Creatinine, Ser: 1.78 mg/dL — ABNORMAL HIGH (ref 0.50–1.35)
GFR calc non Af Amer: 42 mL/min — ABNORMAL LOW (ref >90)
GFR, EST AFRICAN AMERICAN: 49 mL/min — AB (ref >90)
Glucose, Bld: 110 mg/dL — ABNORMAL HIGH (ref 70–99)
Potassium: 3.5 mmol/L (ref 3.5–5.1)
SODIUM: 141 mmol/L (ref 135–145)

## 2014-11-29 LAB — GLUCOSE, CAPILLARY
GLUCOSE-CAPILLARY: 120 mg/dL — AB (ref 70–99)
Glucose-Capillary: 93 mg/dL (ref 70–99)
Glucose-Capillary: 117 mg/dL — ABNORMAL HIGH (ref 70–99)
Glucose-Capillary: 133 mg/dL — ABNORMAL HIGH (ref 70–99)

## 2014-11-29 MED ORDER — INSULIN ASPART 100 UNIT/ML ~~LOC~~ SOLN
0.0000 [IU] | Freq: Three times a day (TID) | SUBCUTANEOUS | Status: DC
  Administered 2014-12-03: 2 [IU] via SUBCUTANEOUS

## 2014-11-29 MED ORDER — LAMOTRIGINE 100 MG PO TABS
100.0000 mg | ORAL_TABLET | Freq: Every day | ORAL | Status: DC
  Administered 2014-11-29 – 2014-12-03 (×5): 100 mg via ORAL
  Filled 2014-11-29 (×3): qty 1
  Filled 2014-11-29: qty 3
  Filled 2014-11-29 (×4): qty 1

## 2014-11-29 NOTE — BHH Group Notes (Signed)
[facility] LCSW Group Therapy  11/29/2014 1:15 pm  Type of Therapy: Process Group Therapy  Participation Level:  Active  Participation Quality:  Appropriate  Affect:  Flat  Cognitive:  Oriented  Insight:  Improving  Engagement in Group:  Limited  Engagement in Therapy:  Limited  Modes of Intervention:  Activity, Clarification, Education, Problem-solving and Support  Summary of Progress/Problems: Today's group addressed the issue of overcoming obstacles.  Patients were asked to identify their biggest obstacle post d/c that stands in the way of their on-going success, and then problem solve as to how to manage this.  Reluctant participant.  Nodding off.  But when I did call on him, he responded appropriately.  "When we leave here it is important for [place] to take our medications and go to our appointments."  Of course, his sister reports that he does neither.  Ivan [name] B 11/29/2014   3:06 PM

## 2014-11-29 NOTE — Progress Notes (Signed)
Inpatient Diabetes Program Recommendations  AACE/ADA: New Consensus Statement on Inpatient Glycemic Control (2013)  Target Ranges:  Prepandial:   less than 140 mg/dL      Peak postprandial:   less than 180 mg/dL (1-2 hours)      Critically ill patients:  140 - 180 mg/dL     Results for HEAVEN, GAZDIK (MRN [id number]) as of 11/29/2014 11:41  Ref. Range 11/28/2014 07:16 11/28/2014 14:33 11/28/2014 16:49 11/28/2014 21:20  Glucose-Capillary Latest Range: 70-99 mg/dL 137 (H) 132 (H) 86 126 (H)    Results for KIERE, EASTERWOOD (MRN [id number]) as of 11/29/2014 11:41  Ref. Range 11/29/2014 05:18 11/29/2014 11:28  Glucose-Capillary Latest Range: 70-99 mg/dL 93 120 (H)     Admit with: Paranoid Schizophrenia  History: DM2, HTN, Bipolar Disorder  Home DM Meds: Glipizide XL 5 mg daily       Januvia 100 mg daily  Current DM Orders: Novolog Moderate SSI tid             Tradjenta 5 mg daily    **Note Novolog Moderate SSI started today at 12PM.  **CBGs stable so far on current regimen.  **Note A1c pending.     Will follow and assist with glucose management   Wyn Quaker RN, MSN, CDE Diabetes Coordinator Inpatient Diabetes Program Team Pager: [phone] (8a-5p)

## 2014-11-29 NOTE — Progress Notes (Signed)
[facility] Group Notes:  (Nursing/MHT/Case Management/Adjunct)  Date:  11/29/2014  Time:  8:37 PM  Type of Therapy:  Psychoeducational Skills  Participation Level:  Active  Participation Quality:  Attentive  Affect:  Appropriate  Cognitive:  Appropriate  Insight:  Improving  Engagement in Group:  Improving  Modes of Intervention:  Education  Summary of Progress/Problems: The patient shared in group that he had a "pretty good" overall. He indicated that he participated in group today which helped him to feel better. As a goal for tomorrow, the patient would like to talk to the doctor about getting discharged.   Archie Balboa S 11/29/2014, 8:37 PM

## 2014-11-29 NOTE — Progress Notes (Signed)
D: Pt denies SI/HI/AVH. Pt is pleasant and cooperative. Pt stated he met his goals of attending groups today. Pt continues to isolate in room at times, but pt comes out and sits in dayroom also.  A: Pt was offered support and encouragement. Pt was given scheduled medications. Pt was encourage to attend groups. Q 15 minute checks were done for safety.  R:Pt attends groups and interacts well with peers and staff. Pt is taking medication. Pt has no complaints.Pt receptive to treatment and safety maintained on unit.

## 2014-11-29 NOTE — BHH Group Notes (Signed)
[facility] LCSW Aftercare Discharge Planning Group Note   11/29/2014 10:50 AM  Participation Quality:  Engaged  Mood/Affect:  Flat  Depression Rating:    Anxiety Rating:    Thoughts of Suicide:  No Will you contract for safety?   NA  Current AVH:  Denies  Plan for Discharge/Comments:  Macrae went through his usual litany of medical/physical complaints while denying he has any psychological issues.  Blames sister for his admission again while revealing none of her concerns.  "She has control over my check.  That's the problem.'  Sister has been guardian for about a year or so.  Unable to say if he is still working with ACT team or not.  CSW to call sister.  Transportation Means:   Supports:  [name]

## 2014-11-29 NOTE — Progress Notes (Signed)
D: Pt denies SI/HI/AVH. Pt is pleasant and cooperative. Pt slept a lot of the evening, pt wanted to go to group, but it was over when he woke up.  A: Pt was offered support and encouragement. Pt was given scheduled medications. Pt was encourage to attend groups. Q 15 minute checks were done for safety.   R:Pt  interacts well with peers and staff. Pt is taking medication. Pt has no complaints at this time.Pt receptive to treatment and safety maintained on unit.

## 2014-11-29 NOTE — Progress Notes (Signed)
D: Patient presents with flat affect; sad and depressed mood.  He stated during group that his sister who is his payee "is not doing me right.  She's taking my money."  Patient rates his depression as an 8; hopelessness as an 8.5 and anxiety as an 8.  Patient main focus during group was his past physical problems.  He denies any SI/HI/aVH. A: Continue to monitor medication management and MD orders.  Safety checks completed every 15 minutes per protocol.  Meet 1:1 with patient to discuss concerns and offer encouragement. R: Patient's behavior is appropriate.
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
## 2014-11-29 NOTE — Progress Notes (Signed)
Nutrition Brief Note  Patient identified on the Malnutrition Screening Tool (MST) Report  Pt with stable weight over the last 6 months.   Wt Readings from Last 15 Encounters:  11/28/14 280 lb (127.007 kg)  08/25/14 275 lb (124.739 kg)  07/07/14 278 lb (126.1 kg)  07/07/14 278 lb (126.1 kg)  07/02/14 274 lb (124.286 kg)  05/20/14 278 lb (126.1 kg)  04/13/14 303 lb (137.44 kg)  04/09/14 300 lb (136.079 kg)  04/07/14 300 lb (136.079 kg)  03/23/13 300 lb (136.079 kg)  02/28/13 285 lb (129.275 kg)  01/08/13 301 lb (136.533 kg)  10/11/11 281 lb (127.461 kg)    Body mass index is 35.18 kg/(m^2). Patient meets criteria for obesity based on current BMI.   Diet Order: Diet Carb Modified Fluid consistency:: Thin; Room service appropriate?: Yes Pt is also offered choice of unit snacks mid-morning and mid-afternoon.  Pt is eating as desired.   Labs and medications reviewed.   No nutrition interventions warranted at this time. If nutrition issues arise, please consult RD.   Clayton Bibles, MS, RD, LDN Pager: [phone] After Hours Pager: [phone]

## 2014-11-29 NOTE — H&P (Signed)
Psychiatric Admission Assessment Adult  Patient Identification:  Ivan [name] Date of Evaluation:  11/29/2014 Chief Complaint: Pt states ,'My sister wanted me to be here.'    Principal Problem: Undifferentiated schizophrenia Diagnosis:  Patient Active Problem List   Diagnosis Date Noted  . Undifferentiated schizophrenia [F20.3] 11/29/2014  . Corneal abrasion [S05.00XA] 07/07/2014  . Weakness generalized [R53.1] 07/07/2014  . Left-sided weakness [M62.89]   . Trauma to eye [S05.90XA]   . Diabetes mellitus due to underlying condition with diabetic nephropathy [E08.21]   . CKD (chronic kidney disease) stage 3, GFR 30-59 ml/min [N18.3]   . Pedal edema [R60.0]   . Chronic pain syndrome [G89.4]   . Essential hypertension [I10]   . Edema of both legs [R60.0] 04/07/2013  . Diabetes [E11.9] 04/01/2013  . Essential hypertension, benign [I10]              History of Present Illness::Ivan [name] is a 54 y.o. AA male with a history of Diabetes Mellitus,Hypertension ,Chronic back pain ,Pedal edema ,Hx of Elevated LFTs due to Depakote ,CKD Stage [name] as well as schizophrenia who presented to the Emergency Department  IVC ed by his sister. Per initial notes in EHR -  patient admitted with bizarre behaviors, agitation, paranoia, and refuseD to cooperate with ED providers,and was reportedly agitated/confused and even (per sister's report) took all his clothes off, walked into a random person's garage and was lying on the floor nude. Per initial collateral information from sister by ED team - sister ,Ivan [name] at [phone] - stated that is "getting worse and going down the path again where he needs to come in. Patient also has been noncompliant on his medications.  Patient seen this AM. Patient is a limited historian at baseline. Pt stated that he presented to the hospital since "my sister took out the petition. It's the same old story , she is  getting my checks , I do not have access to that , I don't want that , I can do it myself."  Pt denies any AH/VH/Paranoia. Pt denies any mood sx.  Pt denies any substance abuse.  Pt is well known to the team at [facility], [place] has had several hospitalizations in the past , last one was in November 2015 - Pt is usually compliant on medications while here , but on discharge stops taking his medications and decompensates. Pt has multiple medical issues including cardiac hx, hence not a good candidate for LAI.  Collateral information was obtained from Sister - Number mentioned above - sister called by writer to get his medication list and to verify whether he was on Haldol ,since we discharged him last time on it. Sister provided pharmacy number - so writer called [name] at [phone] - per them he is on Gabapentin as well as Lamictal ( last refilled on 11/24/14), but not on Haldol. CSW to get medication list from patient's PCP.      Elements:  Location:  mood lability,psychosis. Quality:delusional ,guarded. Severity:  severe. Timing:  constant. Duration:  past 1 week. Context:  schizophrenia. Associated Signs/Synptoms: Depression Symptoms:  pt denies depressive sx (Hypo) Manic Symptoms:  Delusions, Impulsivity, Labiality of Mood, Anxiety Symptoms:  Pt denies Psychotic Symptoms:  Delusions, Paranoia,pt denies but appears to be suspicious/guarded PTSD Symptoms: Negative Total Time spent with patient: 1 hour  Psychiatric Specialty Exam: Physical Exam  Constitutional: He is oriented to person, place, and time. He appears well-developed and well-nourished.  HENT:  Head: Normocephalic and atraumatic.  Eyes: EOM are normal.  Rt eye appears to be red- hx of corneal abrasion - no acute sx  Neck: Normal range of motion. Neck supple.  Cardiovascular: Normal rate and regular rhythm.   Musculoskeletal: Normal range of motion. He exhibits edema (chronic BL LE ).  Neurological: He is alert  and oriented to person, place, and time.  Skin: Skin is warm.  Psychiatric: His speech is normal. His mood appears anxious. He is slowed. Thought content is paranoid. Cognition and memory are normal. He expresses impulsivity.    Review of Systems  Constitutional: Negative.   HENT: Negative.   Eyes: Positive for redness (Right sided).  Respiratory: Negative.   Cardiovascular: Positive for leg swelling.  Gastrointestinal: Negative.   Genitourinary: Negative.   Musculoskeletal: Negative.   Skin: Negative.   Neurological: Negative.   Psychiatric/Behavioral: The patient is nervous/anxious.     Blood pressure 142/71, pulse 61, temperature 98.1 F (36.7 C), temperature source Oral, resp. rate 20, height 6' 2.8" (1.9 m), weight 127.007 kg (280 lb), SpO2 99 %.Body mass index is 35.18 kg/(m^2).  General Appearance: Fairly Groomed  Engineer, water::  Fair  Speech:  Clear and Coherent  Volume:  Normal  Mood:  Anxious about his situation  Affect:  Labile  Thought Process:  Linear  Orientation:  Full (Time, Place, and Person)  Thought Content:  Paranoid Ideation  Suicidal Thoughts:  No  Homicidal Thoughts:  No  Memory:  Immediate;   Fair Recent;   Fair Remote;   Fair  Judgement:  Impaired  Insight:  Lacking  Psychomotor Activity:  Normal  Concentration:  Fair  Recall:  [employer] of Knowledge:Good  Language: Good  Akathisia:  No  Handed:  Right  AIMS (if indicated):     Assets:  Social Support Others:  access to healthcare  Sleep:  Number of Hours: 5    Musculoskeletal: Strength & Muscle Tone: within normal limits Gait & Station: normal Patient leans: N/A   Past Psychiatric History: Diagnosis:schizophrenia  Hospitalizations:several,last one in [facility] (2015-November 2015)  Outpatient Care:[facility]  Substance Abuse Care:Denies  Self-Mutilation:denies  Suicidal Attempts:unknown, patient uncooperative  Violent Behaviors:patient uncooperative    Past Medical History:    Past Medical History  Diagnosis Date  . Diabetes mellitus   . Hypertension   . Bipolar 1 disorder   . Schizoaffective disorder   . Hypokalemia 04/02/2013  . Edema of both legs 04/07/2013  . Paranoid schizophrenia    None. Allergies:   Allergies  Allergen Reactions  . Bee Venom Swelling   PTA Medications: Prescriptions prior to admission  Medication Sig Dispense Refill Last Dose  . allopurinol (ZYLOPRIM) 300 MG tablet Take 300 mg by mouth daily.   Past Week at Unknown time  . amLODipine (NORVASC) 5 MG tablet Take 1 tablet (5 mg total) by mouth 2 (two) times daily.   Past Week at Unknown time  . ARTIFICIAL TEAR SOLUTION OP Apply 1 drop to eye 2 (two) times daily.   07/30/2014 at Unknown time  . aspirin EC 81 MG tablet Take 1 tablet (81 mg total) by mouth daily.   Past Week at Unknown time  . benztropine (COGENTIN) 0.5 MG tablet Take 1 tablet (0.5 mg total) by mouth daily. 30 tablet 0 Past Week at Unknown time  . cloNIDine (CATAPRES) 0.1 MG tablet Take 1 tablet (0.1 mg total) by mouth 2 (two) times daily. 60 tablet 11 Past Week at Unknown time  . furosemide (LASIX) 40 MG tablet Take  1 tablet (40 mg total) by mouth 2 (two) times daily. 30 tablet  Past Week at Unknown time  . gabapentin (NEURONTIN) 400 MG capsule Take 1 capsule (400 mg total) by mouth 3 (three) times daily. 90 capsule 0 Past Week at Unknown time  . glipiZIDE (GLUCOTROL XL) 5 MG 24 hr tablet Take 5 mg by mouth daily with breakfast.   Past Week at Unknown time  . haloperidol (HALDOL) 10 MG tablet Take 1 tablet (10 mg total) by mouth daily. (Patient taking differently: Take 10 mg by mouth every morning. ) 30 tablet 0 07/30/2014 at Unknown time  . hydrALAZINE (APRESOLINE) 100 MG tablet Take 1 tablet (100 mg total) by mouth 3 (three) times daily. 90 tablet 0 Past Week at Unknown time  . HYDROcodone-acetaminophen (NORCO/VICODIN) 5-325 MG per tablet Take 1 tablet by mouth every 6 (six) hours as needed for moderate pain. (Patient  not taking: Reported on 11/25/2014) 12 tablet 0   . labetalol (NORMODYNE) 200 MG tablet Take 1 tablet (200 mg total) by mouth 2 (two) times daily. (Patient taking differently: Take 200 mg by mouth 3 (three) times daily. )   Past Week at Unknown time  . lamoTRIgine (LAMICTAL) 100 MG tablet Take 1 tablet (100 mg total) by mouth daily. 30 tablet 0 Past Week at Unknown time  . potassium chloride (K-DUR) 10 MEQ tablet Take 1 tablet (10 mEq total) by mouth 2 (two) times daily. (Patient taking differently: Take 20 mEq by mouth 2 (two) times daily. )   Past Week at Unknown time  . sitaGLIPtin (JANUVIA) 100 MG tablet Take 1 tablet (100 mg total) by mouth daily.   Past Week at Unknown time  . traZODone (DESYREL) 100 MG tablet Take 1 tablet (100 mg total) by mouth at bedtime. 30 tablet 0 Past Week at Unknown time    Previous Psychotropic Medications:  Medication/Dose  See mar  Haldol             Substance Abuse History in the last 12 months:  Yes.    Consequences of Substance Abuse: NA  Social History:  reports that he has been smoking Cigarettes and Cigars.  He has a 19.5 pack-year smoking history. He has never used smokeless tobacco. He reports that he does not drink alcohol or use illicit drugs. Additional Social History:                      Current Place of Residence:[place] ,[place] Marital Status: Divorced Relationships:relational struggles Education: unknown Glass blower/designer History: None. Legal History:unknown Hobbies/Interests:none  Family History: Patient denies family hx of mental illness or medical issues  Results for orders placed or performed during the hospital encounter of 11/28/14 (from the past 72 hour(s))  Glucose, capillary     Status: Abnormal   Collection Time: 11/28/14  2:33 PM  Result Value Ref Range   Glucose-Capillary 132 (H) 70 - 99 mg/dL   Comment 1 Notify RN   Glucose, capillary     Status: None   Collection Time:  11/28/14  4:49 PM  Result Value Ref Range   Glucose-Capillary 86 70 - 99 mg/dL  Glucose, capillary     Status: Abnormal   Collection Time: 11/28/14  9:20 PM  Result Value Ref Range   Glucose-Capillary 126 (H) 70 - 99 mg/dL  Glucose, capillary     Status: None   Collection Time: 11/29/14  5:18 AM  Result Value Ref Range   Glucose-Capillary 93 70 -  99 mg/dL  Glucose, capillary     Status: Abnormal   Collection Time: 11/29/14 11:28 AM  Result Value Ref Range   Glucose-Capillary 120 (H) 70 - 99 mg/dL   Comment 1 Notify RN    Comment 2 Document in Chart    Psychological Evaluations:denies    Past Medical History  Diagnosis Date  . Diabetes mellitus   . Hypertension   . Bipolar 1 disorder   . Schizoaffective disorder   . Hypokalemia 04/02/2013  . Edema of both legs 04/07/2013  . Paranoid schizophrenia     Current Medications:  Current Facility-Administered Medications  Medication Dose Route Frequency Provider Last Rate Last Dose  . acetaminophen (TYLENOL) tablet 650 mg  650 mg Oral Q6H PRN Encarnacion Slates, NP      . allopurinol (ZYLOPRIM) tablet 300 mg  300 mg Oral Daily Encarnacion Slates, NP   300 mg at 11/29/14 0826  . alum & mag hydroxide-simeth (MAALOX/MYLANTA) 200-200-20 MG/5ML suspension 30 mL  30 mL Oral Q4H PRN Encarnacion Slates, NP      . amLODipine (NORVASC) tablet 5 mg  5 mg Oral BID Encarnacion Slates, NP   5 mg at 11/29/14 0825  . aspirin EC tablet 81 mg  81 mg Oral Daily Encarnacion Slates, NP   81 mg at 11/29/14 0825  . cloNIDine (CATAPRES) tablet 0.1 mg  0.1 mg Oral BID Encarnacion Slates, NP   0.1 mg at 11/29/14 0825  . furosemide (LASIX) tablet 40 mg  40 mg Oral BID Encarnacion Slates, NP   40 mg at 11/29/14 0825  . gabapentin (NEURONTIN) capsule 400 mg  400 mg Oral TID Encarnacion Slates, NP   400 mg at 11/29/14 1137  . hydrALAZINE (APRESOLINE) tablet 100 mg  100 mg Oral TID Encarnacion Slates, NP   100 mg at 11/29/14 1137  . hydrOXYzine (ATARAX/VISTARIL) tablet 25 mg  25 mg Oral Q6H PRN Encarnacion Slates, NP   25 mg at 11/28/14 2105  . insulin aspart (novoLOG) injection 0-15 Units  0-15 Units Subcutaneous TID WC Ursula Alert, MD   0 Units at 11/29/14 1141  . labetalol (NORMODYNE) tablet 200 mg  200 mg Oral BID Encarnacion Slates, NP   200 mg at 11/29/14 0826  . lamoTRIgine (LAMICTAL) tablet 100 mg  100 mg Oral Daily Ursula Alert, MD   100 mg at 11/29/14 1137  . linagliptin (TRADJENTA) tablet 5 mg  5 mg Oral Daily Encarnacion Slates, NP   5 mg at 11/29/14 0825  . magnesium hydroxide (MILK OF MAGNESIA) suspension 30 mL  30 mL Oral Daily PRN Encarnacion Slates, NP      . nicotine polacrilex (NICORETTE) gum 2 mg  2 mg Oral PRN Ursula Alert, MD   2 mg at 11/29/14 0827  . potassium chloride (K-DUR) CR tablet 10 mEq  10 mEq Oral BID Encarnacion Slates, NP   10 mEq at 11/29/14 0826  . traZODone (DESYREL) tablet 100 mg  100 mg Oral QHS Encarnacion Slates, NP   100 mg at 11/28/14 2105    Assessment:  Patient is a 54 year old AAM with multiple medical problems who presented IVC ed for bizarre behavior , laying in some one else's garage naked and not being compliant with medications. Sister ,Anas Keye is patient's legal guardian. Pt will benefit from inpatient stay.  Treatment Plan Summary: Daily contact with patient to assess and evaluate symptoms and progress  in treatment Medication management  Recommendations:  Patient will benefit from inpatient treatment and stabilization.  Estimated length of stay is 5-7 days.  Reviewed past medical records,treatment plan.   Will restart his medication where needed. Will also start a small dose of Kdur and check BMP again.Check Hba1c again. Diabetic coordinator consulted for DM management.  Will continue to monitor vitals ,medication compliance and treatment side effects while patient is here.   CSW will start working on disposition. CSW to get medication list from PCP - unknown why Haldol was discontinued . Patient to participate in therapeutic milieu .          Observation Level/Precautions:  Fall 15 minute checks  Laboratory:  EKG , BMP ,HBa1c.  Psychotherapy:  GROUP AND INDIVIDUAL THERAPY  Medications:  SEE ABOVE  Consultations:Social worker   Discharge Concerns:  Stability and safety       I certify that inpatient services furnished can reasonably be expected to improve the patient's condition.   Gemma Ruan MD 4/11/201612:28 PM
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
## 2014-11-30 LAB — GLUCOSE, CAPILLARY
GLUCOSE-CAPILLARY: 96 mg/dL (ref 70–99)
Glucose-Capillary: 112 mg/dL — ABNORMAL HIGH (ref 70–99)
Glucose-Capillary: 105 mg/dL — ABNORMAL HIGH (ref 70–99)

## 2014-11-30 MED ORDER — HALOPERIDOL 5 MG PO TABS
5.0000 mg | ORAL_TABLET | Freq: Every day | ORAL | Status: DC
  Administered 2014-11-30 – 2014-12-03 (×4): 5 mg via ORAL
  Filled 2014-11-30 (×3): qty 1
  Filled 2014-11-30: qty 3
  Filled 2014-11-30 (×2): qty 1

## 2014-11-30 MED ORDER — BENZTROPINE MESYLATE 0.5 MG PO TABS
ORAL_TABLET | ORAL | Status: AC
  Filled 2014-11-30: qty 1

## 2014-11-30 MED ORDER — HALOPERIDOL 5 MG PO TABS
ORAL_TABLET | ORAL | Status: AC
  Filled 2014-11-30: qty 1

## 2014-11-30 MED ORDER — BENZTROPINE MESYLATE 0.5 MG PO TABS
0.5000 mg | ORAL_TABLET | Freq: Every day | ORAL | Status: DC
  Administered 2014-11-30 – 2014-12-03 (×4): 0.5 mg via ORAL
  Filled 2014-11-30: qty 1
  Filled 2014-11-30: qty 3
  Filled 2014-11-30 (×5): qty 1

## 2014-11-30 NOTE — Progress Notes (Signed)
[facility] MD Progress Note  11/30/2014 2:33 PM Ivan [name]  MRN:  [id number] Subjective: Patient states " I am good, I have no concerns.'  Objective:Patient seen and chart reviewed.Discussed patient with treatment team. Pt today appears to be cooperative . Pt denies any new concerns . Denies any pain in his eyes . Denies any worsening of his leg swelling , does have chronic leg edema . Writer called pharmacy and verified his ophthalmic drops - he last used it in February . Pt with no current concerns with his eyes - could follow up with ophthalmology on discharge. Pt per staff has been compliant with his medications. Pt denies SI/HI/AH/VH. Pt denies any ADRs of medications.    Principal Problem: Undifferentiated schizophrenia Diagnosis:   Patient Active Problem List   Diagnosis Date Noted  . Undifferentiated schizophrenia [F20.3] 11/29/2014  . Corneal abrasion [S05.00XA] 07/07/2014  . Weakness generalized [R53.1] 07/07/2014  . Left-sided weakness [M62.89]   . Trauma to eye [S05.90XA]   . Diabetes mellitus due to underlying condition with diabetic nephropathy [E08.21]   . CKD (chronic kidney disease) stage 3, GFR 30-59 ml/min [N18.3]   . Pedal edema [R60.0]   . Chronic pain syndrome [G89.4]   . Essential hypertension [I10]   . Edema of both legs [R60.0] 04/07/2013  . Diabetes [E11.9] 04/01/2013  . Essential hypertension, benign [I10] 04/01/2013   Total Time spent with patient: 30 minutes   Past Medical History:  Past Medical History  Diagnosis Date  . Diabetes mellitus   . Hypertension   . Bipolar 1 disorder   . Schizoaffective disorder   . Hypokalemia 04/02/2013  . Edema of both legs 04/07/2013  . Paranoid schizophrenia     Past Surgical History  Procedure Laterality Date  . Breast surgery    . Fracture surgery      ribs and arm   Family History: History reviewed. No pertinent family history. Social History:  History  Alcohol Use No     History  Drug Use No     History   Social History  . Marital Status: Divorced    Spouse Name: N/A  . Number of Children: N/A  . Years of Education: N/A   Social History Main Topics  . Smoking status: Current Every Day Smoker -- 0.50 packs/day for 39 years    Types: Cigarettes, Cigars  . Smokeless tobacco: Never Used  . Alcohol Use: No  . Drug Use: No  . Sexual Activity: No   Other Topics Concern  . None   Social History Narrative   Additional History:    Sleep: Fair  Appetite:  Fair     Musculoskeletal: Strength & Muscle Tone: within normal limits Gait & Station: normal Patient leans: N/A   Psychiatric Specialty Exam: Physical Exam  Musculoskeletal: He exhibits edema (BL LE).    Review of Systems  Cardiovascular: Positive for leg swelling.  Psychiatric/Behavioral: Negative for depression, suicidal ideas and hallucinations. The patient is not nervous/anxious and does not have insomnia.     Blood pressure 148/77, pulse 59, temperature 98.1 F (36.7 C), temperature source Oral, resp. rate 18, height 6' 2.8" (1.9 m), weight 127.007 kg (280 lb), SpO2 99 %.Body mass index is 35.18 kg/(m^2).  General Appearance: Casual  Eye Contact::  Fair  Speech:  Clear and Coherent  Volume:  Normal  Mood:  Anxious  Affect:  Appropriate  Thought Process:  Coherent  Orientation:  Full (Time, Place, and Person)  Thought Content:  WDL  Suicidal  Thoughts:  No  Homicidal Thoughts:  No  Memory:  Immediate;   Fair Recent;   Fair Remote;   Fair  Judgement:  Impaired  Insight:  Lacking  Psychomotor Activity:  Normal  Concentration:  Fair  Recall:  [employer] of Knowledge:Fair  Language: Fair  Akathisia:  No  Handed:  Right  AIMS (if indicated):     Assets:  Communication Skills Desire for Improvement  ADL's:  Intact  Cognition: WNL  Sleep:  Number of Hours: 5     Current Medications: Current Facility-Administered Medications  Medication Dose Route Frequency Provider Last Rate Last Dose  .  acetaminophen (TYLENOL) tablet 650 mg  650 mg Oral Q6H PRN Encarnacion Slates, NP   650 mg at 11/30/14 0946  . allopurinol (ZYLOPRIM) tablet 300 mg  300 mg Oral Daily Encarnacion Slates, NP   300 mg at 11/30/14 0815  . alum & mag hydroxide-simeth (MAALOX/MYLANTA) 200-200-20 MG/5ML suspension 30 mL  30 mL Oral Q4H PRN Encarnacion Slates, NP      . amLODipine (NORVASC) tablet 5 mg  5 mg Oral BID Encarnacion Slates, NP   5 mg at 11/30/14 8841  . aspirin EC tablet 81 mg  81 mg Oral Daily Encarnacion Slates, NP   81 mg at 11/30/14 0819  . benztropine (COGENTIN) tablet 0.5 mg  0.5 mg Oral Daily Ursula Alert, MD   0.5 mg at 11/30/14 0946  . cloNIDine (CATAPRES) tablet 0.1 mg  0.1 mg Oral BID Encarnacion Slates, NP   0.1 mg at 11/30/14 0816  . furosemide (LASIX) tablet 40 mg  40 mg Oral BID Encarnacion Slates, NP   40 mg at 11/30/14 0816  . gabapentin (NEURONTIN) capsule 400 mg  400 mg Oral TID Encarnacion Slates, NP   400 mg at 11/30/14 1135  . haloperidol (HALDOL) tablet 5 mg  5 mg Oral Daily Ursula Alert, MD   5 mg at 11/30/14 0946  . hydrALAZINE (APRESOLINE) tablet 100 mg  100 mg Oral TID Encarnacion Slates, NP   100 mg at 11/30/14 1135  . hydrOXYzine (ATARAX/VISTARIL) tablet 25 mg  25 mg Oral Q6H PRN Encarnacion Slates, NP   25 mg at 11/29/14 2142  . insulin aspart (novoLOG) injection 0-15 Units  0-15 Units Subcutaneous TID WC Ursula Alert, MD   Stopped at 11/30/14 1200  . labetalol (NORMODYNE) tablet 200 mg  200 mg Oral BID Encarnacion Slates, NP   200 mg at 11/30/14 0816  . lamoTRIgine (LAMICTAL) tablet 100 mg  100 mg Oral Daily Ursula Alert, MD   100 mg at 11/30/14 0816  . linagliptin (TRADJENTA) tablet 5 mg  5 mg Oral Daily Encarnacion Slates, NP   5 mg at 11/30/14 0815  . magnesium hydroxide (MILK OF MAGNESIA) suspension 30 mL  30 mL Oral Daily PRN Encarnacion Slates, NP      . nicotine polacrilex (NICORETTE) gum 2 mg  2 mg Oral PRN Ursula Alert, MD   2 mg at 11/30/14 1252  . potassium chloride (K-DUR) CR tablet 10 mEq  10 mEq Oral BID Encarnacion Slates,  NP   10 mEq at 11/30/14 0816  . traZODone (DESYREL) tablet 100 mg  100 mg Oral QHS Encarnacion Slates, NP   100 mg at 11/29/14 2141    Lab Results:  Results for orders placed or performed during the hospital encounter of 11/28/14 (from the past 48 hour(s))  Glucose, capillary  Status: None   Collection Time: 11/28/14  4:49 PM  Result Value Ref Range   Glucose-Capillary 86 70 - 99 mg/dL  Glucose, capillary     Status: Abnormal   Collection Time: 11/28/14  9:20 PM  Result Value Ref Range   Glucose-Capillary 126 (H) 70 - 99 mg/dL  Glucose, capillary     Status: None   Collection Time: 11/29/14  5:18 AM  Result Value Ref Range   Glucose-Capillary 93 70 - 99 mg/dL  Glucose, capillary     Status: Abnormal   Collection Time: 11/29/14 11:28 AM  Result Value Ref Range   Glucose-Capillary 120 (H) 70 - 99 mg/dL   Comment 1 Notify RN    Comment 2 Document in Chart   Glucose, capillary     Status: Abnormal   Collection Time: 11/29/14  5:16 PM  Result Value Ref Range   Glucose-Capillary 117 (H) 70 - 99 mg/dL   Comment 1 Notify RN    Comment 2 Document in Chart   Basic metabolic panel     Status: Abnormal   Collection Time: 11/29/14  8:08 PM  Result Value Ref Range   Sodium 141 135 - 145 mmol/L   Potassium 3.5 3.5 - 5.1 mmol/L   Chloride 105 96 - 112 mmol/L   CO2 28 19 - 32 mmol/L   Glucose, Bld 110 (H) 70 - 99 mg/dL   BUN 22 6 - 23 mg/dL   Creatinine, Ser 1.78 (H) 0.50 - 1.35 mg/dL   Calcium 9.0 8.4 - 10.5 mg/dL   GFR calc non Af Amer 42 (L) >90 mL/min   GFR calc Af Amer 49 (L) >90 mL/min    Comment: (NOTE) The eGFR has been calculated using the CKD EPI equation. This calculation has not been validated in all clinical situations. eGFR's persistently <90 mL/min signify possible Chronic Kidney Disease.    Anion gap 8 5 - 15    Comment: Performed at [facility]  Glucose, capillary     Status: Abnormal   Collection Time: 11/29/14  8:26 PM  Result Value Ref Range    Glucose-Capillary 133 (H) 70 - 99 mg/dL  Glucose, capillary     Status: None   Collection Time: 11/30/14  5:30 AM  Result Value Ref Range   Glucose-Capillary 96 70 - 99 mg/dL  Glucose, capillary     Status: Abnormal   Collection Time: 11/30/14 11:31 AM  Result Value Ref Range   Glucose-Capillary 112 (H) 70 - 99 mg/dL   Comment 1 Notify RN    Comment 2 Document in Chart     Physical Findings: AIMS: Facial and Oral Movements Muscles of Facial Expression: None, normal Lips and Perioral Area: None, normal Jaw: None, normal Tongue: None, normal,Extremity Movements Upper (arms, wrists, hands, fingers): None, normal Lower (legs, knees, ankles, toes): None, normal, Trunk Movements Neck, shoulders, hips: None, normal, Overall Severity Severity of abnormal movements (highest score from questions above): None, normal Incapacitation due to abnormal movements: None, normal Patient's awareness of abnormal movements (rate only patient's report): No Awareness, Dental Status Current problems with teeth and/or dentures?: No Does patient usually wear dentures?: No  CIWA:    COWS:      Assessment: Paula Zietz III is a 54 y.o. AA male with a history of Diabetes Mellitus,Hypertension ,Chronic back pain ,Pedal edema ,Hx of Elevated LFTs due to Depakote ,CKD Stage III as well as schizophrenia who presented to the Emergency Department IVC ed by his sister.  Patient per initial notes was acting bizarre,agitated. However patient has been calm and cooperative after admission. Pt has been restarted on his medications. Will continue to monitor patient.  Treatment Plan Summary: Daily contact with patient to assess and evaluate symptoms and progress in treatment and Medication management Will restart Haldol 5 mg po daily along with cogentin 0.5 mg po daily. Will continue Lamictal 100 mg po daily for mood lability. Will continue Gabapentin 400 mg po tid for anxiety. Will continue Trazodone 100 mg po  qhs. Will continue Kdur . Will repeat BMP- 12/02/14. CSW will work on disposition.  Medical Decision Making:  Review of Psycho-Social Stressors (1), Review or order clinical lab tests (1), Review of Last Therapy Session (1), Review or order medicine tests (1), Review of Medication Regimen & Side Effects (2) and Review of New Medication or Change in Dosage (2)     Imaan Padgett MD 11/30/2014, 2:33 PM

## 2014-11-30 NOTE — BHH Suicide Risk Assessment (Signed)
[facility] INPATIENT:  Family/Significant Other Suicide Prevention Education  Suicide Prevention Education:  Education Completed; No one has been identified by the patient as the family member/significant other with whom the patient will be residing, and identified as the person(s) who will aid the patient in the event of a mental health crisis (suicidal ideations/suicide attempt).  With written consent from the patient, the family member/significant other has been provided the following suicide prevention education, prior to the and/or following the discharge of the patient.  The suicide prevention education provided includes the following:  Suicide risk factors  Suicide prevention and interventions  National Suicide Hotline telephone number  [facility] assessment telephone number  [facility] Emergency Assistance [place] and/or Residential Mobile Crisis Unit telephone number  Request made of family/significant other to:  Remove weapons (e.g., guns, rifles, knives), all items previously/currently identified as safety concern.    Remove drugs/medications (over-the-counter, prescriptions, illicit drugs), all items previously/currently identified as a safety concern.  The family member/significant other verbalizes understanding of the suicide prevention education information provided.  The family member/significant other agrees to remove the items of safety concern listed above.  The patient did not endorse SI at the time of admission, nor did the patient c/o SI during the stay here.  SPE not required.   [name] 11/30/2014, 10:38 AM

## 2014-11-30 NOTE — Progress Notes (Addendum)
Patient pleasant and cooperative this morning. Affect flat and sad with congruent mood. He rates his depression, hopelessness and anxiety at an 8/10. Patient keeps to himself and is quiet however behavior is appropriate. He denies any concerns other than chronic back pain of a 7/10 for which he was medicated. Medicated per orders and tylenol given. Patient supported, reassured. Discussed patient's care with MD. On reassess of pain, patient is asleep. He denies SI/HI/AVH and remains safe. Jamie Kato

## 2014-11-30 NOTE — Progress Notes (Signed)
D: Pt denies SI/HI/AVh. Pt is pleasant and cooperative. Pt stated he was feeling good for the day, just a little back pain.   A: Pt was offered support and encouragement. Pt was given scheduled medications. Pt was encourage to attend groups. Q 15 minute checks were done for safety.   R:Pt attends groups and interacts well with peers and staff. Pt is taking medication. Pt receptive to treatment and safety maintained on unit.

## 2014-11-30 NOTE — Plan of Care (Signed)
Problem: Ineffective individual coping Goal: STG: Patient will remain free from self harm Outcome: Progressing Pt safe on the unit  Problem: Alteration in thought process Goal: STG-Patient does not respond to command hallucinations Outcome: Progressing Pt denies SI/HI at this time

## 2014-11-30 NOTE — Plan of Care (Signed)
Problem: Diagnosis: Increased Risk For Suicide Attempt Goal: STG-Patient Will Report Suicidal Feelings to Staff Outcome: Progressing Patient denies SI and has not engaged in self harm while on unit. Goal: STG-Patient Will Comply With Medication Regime Outcome: Progressing Patient has been med compliant and receptive to med changes/education.

## 2014-11-30 NOTE — BHH Group Notes (Signed)
Ivan [name] [place] LCSW Group Therapy  11/30/2014 , 1:04 PM   Type of Therapy:  Group Therapy  Participation Level:  Active  Participation Quality:  Attentive  Affect:  Appropriate  Cognitive:  Alert  Insight:  Improving  Engagement in Therapy:  Engaged  Modes of Intervention:  Discussion, Exploration and Socialization  Summary of Progress/Problems: Today's group focused on the term Diagnosis.  Participants were asked to define the term, and then pronounce whether it is a negative, positive or neutral term.  Minimal participation, but not disruptive.  Stayed for half the group and then left.  Talked about chronic pain and the need for medication.  Ivan [name] 11/30/2014 , 1:04 PM

## 2014-11-30 NOTE — Tx Team (Signed)
  Interdisciplinary Treatment Plan Update   Date Reviewed:  11/30/2014  Time Reviewed:  8:53 AM  Progress in Treatment:   Attending groups: Yes Participating in groups: Yes Taking medication as prescribed: Yes  Tolerating medication: Yes Family/Significant other contact made: Yes  Patient understands diagnosis:No  Limited insight  Discussing patient identified problems/goals with staff: Yes  See initial care plan Medical problems stabilized or resolved: Yes Denies suicidal/homicidal ideation: Yes  In tx team Patient has not harmed self or others: Yes  For review of initial/current patient goals, please see plan of care.  Estimated Length of Stay:  4-5 days  Reason for Continuation of Hospitalization: Medication stabilization Other; describe Non compliance with meds outside of a structured setting  New Problems/Goals identified:  N/A  Discharge Plan or Barriers:   return home, follow up with ACT team  Additional Comments:  Ivan [name] is an 54 y.o. male who was brought to the emergency department by his sister who IVC'd him due to bizarre behavior and possible psychosis. When assessed by writer pt refused to answer any questions, was irritable and angry and stated "I just came here for my back pain, shoulder pain and I'm restless, that's all I'm saying". Collateral information was gathered from sister who states that he has a long psychiatric history of psychosis and has been hospitalized multiple times. Sister is his legal guardian but he does live in a house alone. Haldol, Neurontin, Lamictal trial plus multiple medical medications  Attendees:  Signature: Steva Colder, MD 11/30/2014 8:53 AM   Signature: Ripley Fraise, LCSW 11/30/2014 8:53 AM  Signature: Elmarie Shiley, NP 11/30/2014 8:53 AM  Signature: Raenette Rover, RN 11/30/2014 8:53 AM  Signature:  11/30/2014 8:53 AM  Signature:  11/30/2014 8:53 AM  Signature:   11/30/2014 8:53 AM  Signature:    Signature:    Signature:    Signature:     Signature:    Signature:      Scribe for Treatment Team:   Ripley Fraise, LCSW  11/30/2014 8:53 AM

## 2014-12-01 LAB — GLUCOSE, CAPILLARY
GLUCOSE-CAPILLARY: 103 mg/dL — AB (ref 70–99)
GLUCOSE-CAPILLARY: 99 mg/dL (ref 70–99)
GLUCOSE-CAPILLARY: 108 mg/dL — AB (ref 70–99)
Glucose-Capillary: 108 mg/dL — ABNORMAL HIGH (ref 70–99)
Glucose-Capillary: 100 mg/dL — ABNORMAL HIGH (ref 70–99)

## 2014-12-01 LAB — HEMOGLOBIN A1C
HEMOGLOBIN A1C: 5.8 % — AB (ref 4.8–5.6)
Mean Plasma Glucose: 120 mg/dL

## 2014-12-01 NOTE — BHH Group Notes (Signed)
[facility] LCSW Aftercare Discharge Planning Group Note   12/01/2014 10:29 AM  Participation Quality:  Minimal  Mood/Affect:  Flat  Depression Rating:  denies  Anxiety Rating:  denies  Thoughts of Suicide:  No Will you contract for safety?   NA  Current AVH:  No  Plan for Discharge/Comments:  No complaints.  States he is fine and ready to return home.  Is willing to sign release for [facility] if it means he can get out of here.  Will refer to ACT team per sister's request.  Transportation Means:   Supports:  Ivan [name]

## 2014-12-01 NOTE — Progress Notes (Signed)
[facility] MD Progress Note  12/01/2014 1:01 PM Ivan [name]  MRN:  [id number] Subjective: Patient states " I am fine.'   Objective:Patient seen and chart reviewed.Discussed patient with treatment team. Pt continues to have a depressed affect , continues to have limited participation during evaluation , states "everything is fine , I am ok." Pt denies any new concerns . Pt denies SI/HI/AH/VH. Pt denies any ADRs of medications.Staff did not report any disruptive issues on the unit and states patient is compliant on medications.    Principal Problem: Undifferentiated schizophrenia Diagnosis:   Patient Active Problem List   Diagnosis Date Noted  . Undifferentiated schizophrenia [F20.3] 11/29/2014  . Corneal abrasion [S05.00XA] 07/07/2014  . Weakness generalized [R53.1] 07/07/2014  . Left-sided weakness [M62.89]   . Trauma to eye [S05.90XA]   . Diabetes mellitus due to underlying condition with diabetic nephropathy [E08.21]   . CKD (chronic kidney disease) stage 3, GFR 30-59 ml/min [N18.3]   . Pedal edema [R60.0]   . Chronic pain syndrome [G89.4]   . Essential hypertension [I10]   . Edema of both legs [R60.0] 04/07/2013  . Diabetes [E11.9] 04/01/2013  . Essential hypertension, benign [I10] 04/01/2013   Total Time spent with patient: 30 minutes   Past Medical History:  Past Medical History  Diagnosis Date  . Diabetes mellitus   . Hypertension   . Bipolar 1 disorder   . Schizoaffective disorder   . Hypokalemia 04/02/2013  . Edema of both legs 04/07/2013  . Paranoid schizophrenia     Past Surgical History  Procedure Laterality Date  . Breast surgery    . Fracture surgery      ribs and arm   Family History: History reviewed. No pertinent family history. Social History:  History  Alcohol Use No     History  Drug Use No    History   Social History  . Marital Status: Divorced    Spouse Name: N/A  . Number of Children: N/A  . Years of Education: N/A   Social History Main  Topics  . Smoking status: Current Every Day Smoker -- 0.50 packs/day for 39 years    Types: Cigarettes, Cigars  . Smokeless tobacco: Never Used  . Alcohol Use: No  . Drug Use: No  . Sexual Activity: No   Other Topics Concern  . None   Social History Narrative   Additional History:    Sleep: Fair  Appetite:  Fair     Musculoskeletal: Strength & Muscle Tone: within normal limits Gait & Station: normal Patient leans: N/A   Psychiatric Specialty Exam: Physical Exam  Musculoskeletal: He exhibits edema (BL LE).    Review of Systems  Psychiatric/Behavioral: The patient is nervous/anxious.     Blood pressure 141/77, pulse 77, temperature 98.1 F (36.7 C), temperature source Oral, resp. rate 18, height 6' 2.8" (1.9 m), weight 127.007 kg (280 lb), SpO2 99 %.Body mass index is 35.18 kg/(m^2).  General Appearance: Casual  Eye Contact::  Fair  Speech:  Clear and Coherent  Volume:  Normal  Mood:  Anxious  Affect:  Appropriate  Thought Process:  Coherent  Orientation:  Full (Time, Place, and Person)  Thought Content:  WDL  Suicidal Thoughts:  No  Homicidal Thoughts:  No  Memory:  Immediate;   Fair Recent;   Fair Remote;   Fair  Judgement:  Impaired  Insight:  Lacking  Psychomotor Activity:  Normal  Concentration:  Fair  Recall:  [place]  Language: Fair  Akathisia:  No  Handed:  Right  AIMS (if indicated):     Assets:  Communication Skills Desire for Improvement  ADL's:  Intact  Cognition: WNL  Sleep:  Number of Hours: 5     Current Medications: Current Facility-Administered Medications  Medication Dose Route Frequency Provider Last Rate Last Dose  . acetaminophen (TYLENOL) tablet 650 mg  650 mg Oral Q6H PRN Encarnacion Slates, NP   650 mg at 12/01/14 2505  . allopurinol (ZYLOPRIM) tablet 300 mg  300 mg Oral Daily Encarnacion Slates, NP   300 mg at 12/01/14 3976  . alum & mag hydroxide-simeth (MAALOX/MYLANTA) 200-200-20 MG/5ML suspension 30 mL  30  mL Oral Q4H PRN Encarnacion Slates, NP      . amLODipine (NORVASC) tablet 5 mg  5 mg Oral BID Encarnacion Slates, NP   5 mg at 12/01/14 7341  . aspirin EC tablet 81 mg  81 mg Oral Daily Encarnacion Slates, NP   81 mg at 12/01/14 9379  . benztropine (COGENTIN) tablet 0.5 mg  0.5 mg Oral Daily Ursula Alert, MD   0.5 mg at 12/01/14 0834  . cloNIDine (CATAPRES) tablet 0.1 mg  0.1 mg Oral BID Encarnacion Slates, NP   0.1 mg at 12/01/14 0240  . furosemide (LASIX) tablet 40 mg  40 mg Oral BID Encarnacion Slates, NP   40 mg at 12/01/14 9735  . gabapentin (NEURONTIN) capsule 400 mg  400 mg Oral TID Encarnacion Slates, NP   400 mg at 12/01/14 1156  . haloperidol (HALDOL) tablet 5 mg  5 mg Oral Daily Ursula Alert, MD   5 mg at 12/01/14 3299  . hydrALAZINE (APRESOLINE) tablet 100 mg  100 mg Oral TID Encarnacion Slates, NP   100 mg at 12/01/14 1156  . hydrOXYzine (ATARAX/VISTARIL) tablet 25 mg  25 mg Oral Q6H PRN Encarnacion Slates, NP   25 mg at 11/30/14 2156  . insulin aspart (novoLOG) injection 0-15 Units  0-15 Units Subcutaneous TID WC Ursula Alert, MD   Stopped at 11/30/14 1200  . labetalol (NORMODYNE) tablet 200 mg  200 mg Oral BID Encarnacion Slates, NP   200 mg at 12/01/14 2426  . lamoTRIgine (LAMICTAL) tablet 100 mg  100 mg Oral Daily Ursula Alert, MD   100 mg at 12/01/14 0832  . linagliptin (TRADJENTA) tablet 5 mg  5 mg Oral Daily Encarnacion Slates, NP   5 mg at 12/01/14 8341  . magnesium hydroxide (MILK OF MAGNESIA) suspension 30 mL  30 mL Oral Daily PRN Encarnacion Slates, NP      . nicotine polacrilex (NICORETTE) gum 2 mg  2 mg Oral PRN Ursula Alert, MD   2 mg at 11/30/14 1951  . potassium chloride (K-DUR) CR tablet 10 mEq  10 mEq Oral BID Encarnacion Slates, NP   10 mEq at 12/01/14 9622  . traZODone (DESYREL) tablet 100 mg  100 mg Oral QHS Encarnacion Slates, NP   100 mg at 11/30/14 2158    Lab Results:  Results for orders placed or performed during the hospital encounter of 11/28/14 (from the past 48 hour(s))  Glucose, capillary     Status:  Abnormal   Collection Time: 11/29/14  5:16 PM  Result Value Ref Range   Glucose-Capillary 117 (H) 70 - 99 mg/dL   Comment 1 Notify RN    Comment 2 Document in Chart   Hemoglobin A1c     Status: Abnormal  Collection Time: 11/29/14  8:02 PM  Result Value Ref Range   Hgb A1c MFr Bld 5.8 (H) 4.8 - 5.6 %    Comment: (NOTE)         Pre-diabetes: 5.7 - 6.4         Diabetes: >6.4         Glycemic control for adults with diabetes: <7.0    Mean Plasma Glucose 120 mg/dL    Comment: (NOTE) Performed At: [facility] [place], [place] [id number] Lindon Romp MD NW:[id number] Performed at [place] metabolic panel     Status: Abnormal   Collection Time: 11/29/14  8:08 PM  Result Value Ref Range   Sodium 141 135 - 145 mmol/L   Potassium 3.5 3.5 - 5.1 mmol/L   Chloride 105 96 - 112 mmol/L   CO2 28 19 - 32 mmol/L   Glucose, Bld 110 (H) 70 - 99 mg/dL   BUN 22 6 - 23 mg/dL   Creatinine, Ser 1.78 (H) 0.50 - 1.35 mg/dL   Calcium 9.0 8.4 - 10.5 mg/dL   GFR calc non Af Amer 42 (L) >90 mL/min   GFR calc Af Amer 49 (L) >90 mL/min    Comment: (NOTE) The eGFR has been calculated using the CKD EPI equation. This calculation has not been validated in all clinical situations. eGFR's persistently <90 mL/min signify possible Chronic Kidney Disease.    Anion gap 8 5 - 15    Comment: Performed at [facility]  Glucose, capillary     Status: Abnormal   Collection Time: 11/29/14  8:26 PM  Result Value Ref Range   Glucose-Capillary 133 (H) 70 - 99 mg/dL  Glucose, capillary     Status: None   Collection Time: 11/30/14  5:30 AM  Result Value Ref Range   Glucose-Capillary 96 70 - 99 mg/dL  Glucose, capillary     Status: Abnormal   Collection Time: 11/30/14 11:31 AM  Result Value Ref Range   Glucose-Capillary 112 (H) 70 - 99 mg/dL   Comment 1 Notify RN    Comment 2 Document in Chart   Glucose, capillary     Status: Abnormal    Collection Time: 11/30/14  4:52 PM  Result Value Ref Range   Glucose-Capillary 105 (H) 70 - 99 mg/dL   Comment 1 Notify RN    Comment 2 Document in Chart   Glucose, capillary     Status: Abnormal   Collection Time: 11/30/14  9:21 PM  Result Value Ref Range   Glucose-Capillary 108 (H) 70 - 99 mg/dL  Glucose, capillary     Status: Abnormal   Collection Time: 12/01/14  5:33 AM  Result Value Ref Range   Glucose-Capillary 103 (H) 70 - 99 mg/dL  Glucose, capillary     Status: Abnormal   Collection Time: 12/01/14 11:57 AM  Result Value Ref Range   Glucose-Capillary 100 (H) 70 - 99 mg/dL   Comment 1 Notify RN     Physical Findings: AIMS: Facial and Oral Movements Muscles of Facial Expression: None, normal Lips and Perioral Area: None, normal Jaw: None, normal Tongue: None, normal,Extremity Movements Upper (arms, wrists, hands, fingers): None, normal Lower (legs, knees, ankles, toes): None, normal, Trunk Movements Neck, shoulders, hips: None, normal, Overall Severity Severity of abnormal movements (highest score from questions above): None, normal Incapacitation due to abnormal movements: None, normal Patient's awareness of abnormal movements (rate only patient's report): No Awareness, Dental  Status Current problems with teeth and/or dentures?: No Does patient usually wear dentures?: No  CIWA:    COWS:      Assessment: Ivan [name] is a 54 y.o. AA male with a history of Diabetes Mellitus,Hypertension ,Chronic back pain ,Pedal edema ,Hx of Elevated LFTs due to Depakote ,CKD Stage [name] as well as schizophrenia who presented to the Emergency Department IVC ed by his sister. Patient per initial notes was acting bizarre,agitated. However patient has been calm and cooperative after admission. Pt has been restarted on his medications which will be continued . Will continue to monitor patient.  Treatment Plan Summary: Daily contact with patient to assess and evaluate symptoms and  progress in treatment and Medication management Will continue Haldol 5 mg po daily along with cogentin 0.5 mg po daily. Will continue Lamictal 100 mg po daily for mood lability. Will continue Gabapentin 400 mg po tid for anxiety. Will continue Trazodone 100 mg po qhs. Will continue Kdur . Will repeat BMP- 12/02/14. CSW will work on disposition.  Medical Decision Making:  Review of Psycho-Social Stressors (1), Review or order clinical lab tests (1), Review of Last Therapy Session (1), Review or order medicine tests (1) and Review of Medication Regimen & Side Effects (2)     Terrilyn Tyner MD 12/01/2014, 1:01 PM

## 2014-12-01 NOTE — [facility] Group Notes (Signed)
[place] LCSW Group Therapy  12/01/2014 1:21 PM   Type of Therapy:  Group Therapy  Participation Level:  Invited.  Chose to not attend.  Summary of Progress/Problems: Today's group focused on relapse prevention.  We defined the term, and then brainstormed on ways to prevent relapse.  Ivan [name] B 12/01/2014 , 1:21 PM

## 2014-12-01 NOTE — Progress Notes (Signed)
[facility] Notes:  (Nursing/MHT/Case Management/Adjunct)  Date:  12/01/2014  Time:  9:28 PM  Type of Therapy:  Group Therapy  Participation Level:  Minimal  Participation Quality:  Appropriate  Affect:  Appropriate  Cognitive:  Appropriate  Insight:  Appropriate  Engagement in Group:  Developing/Improving  Modes of Intervention:  Socialization and Support  Summary of Progress/Problems: Pt. Was engaged in group discussion and had positive insight.  Pt. Stated he involved in pray when he is feeling better.  Lanell Persons 12/01/2014, 9:28 PM

## 2014-12-01 NOTE — Progress Notes (Signed)
Patient status remains fairly unchanged. Continues to progress with medication regimen. Displays some forgetfulness but it is mild in nature. Remains flat in affect and mood depressed though patient is pleasant. Medicated per orders. Supported and encouraged. Discussed care with tx team. Med education provided. He denies SI/HI/AVH. Patient safe. Ivan [name]

## 2014-12-01 NOTE — Plan of Care (Signed)
Problem: Alteration in thought process Goal: STG-Patient is able to follow short directions Outcome: Progressing Patient self directed and complies with any requests. Cooperative and appropriate.  Problem: Diagnosis: Increased Risk For Suicide Attempt Goal: STG-Patient Will Attend All Groups On The Unit Outcome: Progressing Patient attends group and is attentive and participative.

## 2014-12-01 NOTE — Progress Notes (Signed)
Patient ID: Ivan [name], male   DOB: [birth year], 54 y.o.   MRN: [id number]  Adult Psychoeducational Group Note  Date:  12/01/2014 Time: 10:00am  Group Topic/Focus:  Healthy Communication:   The focus of this group is to discuss communication, barriers to communication, as well as healthy ways to communicate with others. Orientation:   The focus of this group is to educate the patient on the purpose and policies of crisis stabilization and provide a format to answer questions about their admission.  The group details unit policies and expectations of patients while admitted.  Participation Level:  Did Not Attend  Participation Quality: n/a  Affect: n/a  Cognitive: n/a  Insight: n/a  Engagement in Group: n/a  Modes of Intervention:  Activity, Discussion, Education and Orientation  Additional Comments:  Pt did not attend group. Pt in bed asleep.   [name] 12/01/2014, 12:38 PM

## 2014-12-02 LAB — GLUCOSE, CAPILLARY
GLUCOSE-CAPILLARY: 109 mg/dL — AB (ref 70–99)
GLUCOSE-CAPILLARY: 98 mg/dL (ref 70–99)
GLUCOSE-CAPILLARY: 130 mg/dL — AB (ref 70–99)
Glucose-Capillary: 111 mg/dL — ABNORMAL HIGH (ref 70–99)

## 2014-12-02 LAB — BASIC METABOLIC PANEL
ANION GAP: 10 (ref 5–15)
BUN: 20 mg/dL (ref 6–23)
CO2: 31 mmol/L (ref 19–32)
Calcium: 9.2 mg/dL (ref 8.4–10.5)
Chloride: 99 mmol/L (ref 96–112)
Creatinine, Ser: 1.88 mg/dL — ABNORMAL HIGH (ref 0.50–1.35)
GFR calc Af Amer: 46 mL/min — ABNORMAL LOW (ref >90)
GFR calc non Af Amer: 39 mL/min — ABNORMAL LOW (ref >90)
GLUCOSE: 124 mg/dL — AB (ref 70–99)
Potassium: 3.2 mmol/L — ABNORMAL LOW (ref 3.5–5.1)
SODIUM: 140 mmol/L (ref 135–145)

## 2014-12-02 NOTE — Progress Notes (Signed)
D: Pt denies SI/HI/AV. Pt is pleasant and cooperative. Pt rates depression at a 8, anxiety at a 6.5, and Helplessness/hopelessness at a 8. Pt stated then an hour later he wants to go home and was no longer depressed.   A: Pt was offered support and encouragement. Pt was given scheduled medications. Pt was encourage to attend groups. Q 15 minute checks were done for safety.  R:Pt attends groups and interacts well with peers and staff. Pt taking medication. Pt has no complaints.Pt receptive to treatment and safety maintained on unit.

## 2014-12-02 NOTE — Progress Notes (Addendum)
[facility] MD Progress Note  12/02/2014 2:11 PM Ivan [name]  MRN:  [id number] Subjective: Patient states " I am fine.'   Objective:Patient seen and chart reviewed.Discussed patient with treatment team.  Pt continues to be minimally interactive , which is patient's baseline. Pt is well known to our [facility] unit . Pt continues to have flat affect , but denies any depression or mood swings. Pt also observed on the unit as calm and cooperative , no disruptive issues noted per staff.When pt was asked by writer "why he gets readmitted , he states he always takes his medications and that its because of family problems.' Patient's sister is his legal guardian. Pt continues to have Rt. Sided eye - redness - but denies any discharge , worsening of vision or pain.  Pt denies any new concerns . Pt denies SI/HI/AH/VH. Pt denies any ADRs of medications.   Principal Problem: Undifferentiated schizophrenia Diagnosis:   Patient Active Problem List   Diagnosis Date Noted  . Undifferentiated schizophrenia [F20.3] 11/29/2014  . Corneal abrasion [S05.00XA] 07/07/2014  . Weakness generalized [R53.1] 07/07/2014  . Left-sided weakness [M62.89]   . Trauma to eye [S05.90XA]   . Diabetes mellitus due to underlying condition with diabetic nephropathy [E08.21]   . CKD (chronic kidney disease) stage 3, GFR 30-59 ml/min [N18.3]   . Pedal edema [R60.0]   . Chronic pain syndrome [G89.4]   . Essential hypertension [I10]   . Edema of both legs [R60.0] 04/07/2013  . Diabetes [E11.9] 04/01/2013  . Essential hypertension, benign [I10] 04/01/2013   Total Time spent with patient: 30 minutes   Past Medical History:  Past Medical History  Diagnosis Date  . Diabetes mellitus   . Hypertension   . Bipolar 1 disorder   . Schizoaffective disorder   . Hypokalemia 04/02/2013  . Edema of both legs 04/07/2013  . Paranoid schizophrenia     Past Surgical History  Procedure Laterality Date  . Breast surgery    . Fracture surgery      ribs and arm   Family History: History reviewed. No pertinent family history. Social History:  History  Alcohol Use No     History  Drug Use No    History   Social History  . Marital Status: Divorced    Spouse Name: N/A  . Number of Children: N/A  . Years of Education: N/A   Social History Main Topics  . Smoking status: Current Every Day Smoker -- 0.50 packs/day for 39 years    Types: Cigarettes, Cigars  . Smokeless tobacco: Never Used  . Alcohol Use: No  . Drug Use: No  . Sexual Activity: No   Other Topics Concern  . None   Social History Narrative   Additional History:    Sleep: Fair  Appetite:  Fair     Musculoskeletal: Strength & Muscle Tone: within normal limits Gait & Station: normal Patient leans: N/A   Psychiatric Specialty Exam: Physical Exam  Musculoskeletal: He exhibits edema (BL LE).    Review of Systems  Psychiatric/Behavioral: Positive for depression (improving). The patient is nervous/anxious (improving).     Blood pressure 130/68, pulse 118, temperature 97.4 F (36.3 C), temperature source Oral, resp. rate 20, height 6' 2.8" (1.9 m), weight 127.007 kg (280 lb), SpO2 99 %.Body mass index is 35.18 kg/(m^2).  General Appearance: Casual  Eye Contact::  Fair  Speech:  Clear and Coherent  Volume:  Normal  Mood:  Anxious improving  Affect:  Appropriate  Thought Process:  Coherent  Orientation:  Full (Time, Place, and Person)  Thought Content:  WDL  Suicidal Thoughts:  No  Homicidal Thoughts:  No  Memory:  Immediate;   Fair Recent;   Fair Remote;   Fair  Judgement:  Impaired  Insight:  Lacking  Psychomotor Activity:  Normal  Concentration:  Fair  Recall:  [employer] of Knowledge:Fair  Language: Fair  Akathisia:  No  Handed:  Right  AIMS (if indicated):   0  Assets:  Communication Skills Desire for Improvement  ADL's:  Intact  Cognition: WNL  Sleep:  Number of Hours: 1     Current Medications: Current  Facility-Administered Medications  Medication Dose Route Frequency Provider Last Rate Last Dose  . acetaminophen (TYLENOL) tablet 650 mg  650 mg Oral Q6H PRN Encarnacion Slates, NP   650 mg at 12/01/14 2120  . allopurinol (ZYLOPRIM) tablet 300 mg  300 mg Oral Daily Encarnacion Slates, NP   300 mg at 12/02/14 0815  . alum & mag hydroxide-simeth (MAALOX/MYLANTA) 200-200-20 MG/5ML suspension 30 mL  30 mL Oral Q4H PRN Encarnacion Slates, NP      . amLODipine (NORVASC) tablet 5 mg  5 mg Oral BID Encarnacion Slates, NP   5 mg at 12/02/14 0815  . aspirin EC tablet 81 mg  81 mg Oral Daily Encarnacion Slates, NP   81 mg at 12/02/14 0816  . benztropine (COGENTIN) tablet 0.5 mg  0.5 mg Oral Daily Ursula Alert, MD   0.5 mg at 12/02/14 0815  . cloNIDine (CATAPRES) tablet 0.1 mg  0.1 mg Oral BID Encarnacion Slates, NP   0.1 mg at 12/02/14 0815  . furosemide (LASIX) tablet 40 mg  40 mg Oral BID Encarnacion Slates, NP   40 mg at 12/02/14 0815  . gabapentin (NEURONTIN) capsule 400 mg  400 mg Oral TID Encarnacion Slates, NP   400 mg at 12/02/14 1154  . haloperidol (HALDOL) tablet 5 mg  5 mg Oral Daily Zeplin Aleshire, MD   5 mg at 12/02/14 0815  . hydrALAZINE (APRESOLINE) tablet 100 mg  100 mg Oral TID Encarnacion Slates, NP   100 mg at 12/02/14 1154  . hydrOXYzine (ATARAX/VISTARIL) tablet 25 mg  25 mg Oral Q6H PRN Encarnacion Slates, NP   25 mg at 11/30/14 2156  . insulin aspart (novoLOG) injection 0-15 Units  0-15 Units Subcutaneous TID WC Ursula Alert, MD   Stopped at 11/30/14 1200  . labetalol (NORMODYNE) tablet 200 mg  200 mg Oral BID Encarnacion Slates, NP   200 mg at 12/02/14 0815  . lamoTRIgine (LAMICTAL) tablet 100 mg  100 mg Oral Daily Ursula Alert, MD   100 mg at 12/02/14 0815  . linagliptin (TRADJENTA) tablet 5 mg  5 mg Oral Daily Encarnacion Slates, NP   5 mg at 12/02/14 0815  . magnesium hydroxide (MILK OF MAGNESIA) suspension 30 mL  30 mL Oral Daily PRN Encarnacion Slates, NP      . nicotine polacrilex (NICORETTE) gum 2 mg  2 mg Oral PRN Ursula Alert, MD    2 mg at 12/02/14 0828  . potassium chloride (K-DUR) CR tablet 10 mEq  10 mEq Oral BID Encarnacion Slates, NP   10 mEq at 12/02/14 0816  . traZODone (DESYREL) tablet 100 mg  100 mg Oral QHS Encarnacion Slates, NP   100 mg at 11/30/14 2158    Lab Results:  Results for orders placed  or performed during the hospital encounter of 11/28/14 (from the past 48 hour(s))  Glucose, capillary     Status: Abnormal   Collection Time: 11/30/14  4:52 PM  Result Value Ref Range   Glucose-Capillary 105 (H) 70 - 99 mg/dL   Comment 1 Notify RN    Comment 2 Document in Chart   Glucose, capillary     Status: Abnormal   Collection Time: 11/30/14  9:21 PM  Result Value Ref Range   Glucose-Capillary 108 (H) 70 - 99 mg/dL  Glucose, capillary     Status: Abnormal   Collection Time: 12/01/14  5:33 AM  Result Value Ref Range   Glucose-Capillary 103 (H) 70 - 99 mg/dL  Glucose, capillary     Status: Abnormal   Collection Time: 12/01/14 11:57 AM  Result Value Ref Range   Glucose-Capillary 100 (H) 70 - 99 mg/dL   Comment 1 Notify RN   Glucose, capillary     Status: None   Collection Time: 12/01/14  4:53 PM  Result Value Ref Range   Glucose-Capillary 99 70 - 99 mg/dL  Glucose, capillary     Status: Abnormal   Collection Time: 12/01/14  8:57 PM  Result Value Ref Range   Glucose-Capillary 108 (H) 70 - 99 mg/dL   Comment 1 Notify RN    Comment 2 Document in Chart   Glucose, capillary     Status: Abnormal   Collection Time: 12/02/14  6:05 AM  Result Value Ref Range   Glucose-Capillary 109 (H) 70 - 99 mg/dL  Glucose, capillary     Status: None   Collection Time: 12/02/14 11:52 AM  Result Value Ref Range   Glucose-Capillary 98 70 - 99 mg/dL    Physical Findings: AIMS: Facial and Oral Movements Muscles of Facial Expression: None, normal Lips and Perioral Area: None, normal Jaw: None, normal Tongue: None, normal,Extremity Movements Upper (arms, wrists, hands, fingers): None, normal Lower (legs, knees, ankles, toes):  None, normal, Trunk Movements Neck, shoulders, hips: None, normal, Overall Severity Severity of abnormal movements (highest score from questions above): None, normal Incapacitation due to abnormal movements: None, normal Patient's awareness of abnormal movements (rate only patient's report): No Awareness, Dental Status Current problems with teeth and/or dentures?: No Does patient usually wear dentures?: No  CIWA:    COWS:      Assessment: Ivan [name] is a 54 y.o. AA male with a history of Diabetes Mellitus,Hypertension ,Chronic back pain ,Pedal edema ,Hx of Elevated LFTs due to Depakote ,CKD Stage [name] as well as schizophrenia who presented to the Emergency Department IVC ed by his sister. Patient per initial notes was acting bizarre,agitated. However patient has been calm and cooperative after admission. Pt has been restarted on his medications which will be continued . Will continue to monitor patient.  Treatment Plan Summary: Daily contact with patient to assess and evaluate symptoms and progress in treatment and Medication management Will continue Haldol 5 mg po daily along with cogentin 0.5 mg po daily.Pt declined LAI.States he is compliant on medications. Will continue Lamictal 100 mg po daily for mood lability. Will continue Gabapentin 400 mg po tid for anxiety. Will continue Trazodone 100 mg po qhs.Sleep documented as 1 hr last night , pt states he had restless sleep , but does not want trazodone to be increased. Will continue Kdur . Will repeat BMP tonight- 12/02/14. CSW will work on disposition.  Medical Decision Making:  Review of Psycho-Social Stressors (1), Review or order clinical lab tests (  1), Review of Last Therapy Session (1), Review or order medicine tests (1) and Review of Medication Regimen & Side Effects (2)     Roann Merk MD 12/02/2014, 2:11 PM

## 2014-12-02 NOTE — Progress Notes (Signed)
Pt observed sitting in the dayroom most of the evening watching TV.  He has had very little interaction with peers, but has remained calm and cooperative with staff.  Pt denies SI/HI/AVH at this time.  He states he has been going to groups.  His only complaint is that his back continues to hurt him.  PRN medication given as ordered.  Pt is unsure of when and where he will be discharging, but hopes it will be soon.  Pt makes his needs known to staff.  Support and encouragement offered.  Safety maintained with q15 minute checks.

## 2014-12-02 NOTE — Progress Notes (Signed)
Pt has been in the dayroom most of the evening.  He chose not to go to [employer], but took a shower during that time and return to the dayroom to watch TV.  Pt reports he has had a good day.  He feels his medications are working.  His CBGs are staying at a good level.  He says he has been going to groups.  Pt is pleasant and cooperative with staff.  He is appropriate with his peers although his interaction with them is minimal.  Pt is hoping to discharge home soon.  Pt makes his needs known to staff.  Support and encouragement offered.  Safety maintained with q15 minute checks.

## 2014-12-02 NOTE — BHH Group Notes (Signed)
[facility] Group Notes:  (Nursing/MHT/Case Management/Adjunct)  Date:  12/02/2014  Time:  0900  Type of Therapy:  Nurse Education  Participation Level:  Minimal  Participation Quality:  Appropriate  Affect:  Appropriate  Cognitive:  Appropriate  Insight:  Good  Engagement in Group:  Limited  Modes of Intervention:  Discussion  Summary of Progress/Problems:  Catalina Gravel 12/02/2014, 7:47 PM

## 2014-12-02 NOTE — Progress Notes (Signed)
Patient did not attend Karaoke group tonight.

## 2014-12-02 NOTE — BHH Group Notes (Signed)
[facility] Group Notes:  (Counselor/Nursing/MHT/Case Management/Adjunct)  12/02/2014 1:15PM  Type of Therapy:  Group Therapy  Participation Level:  Active  Participation Quality:  Appropriate  Affect:  Flat  Cognitive:  Oriented  Insight:  Improving  Engagement in Group:  Limited  Engagement in Therapy:  Limited  Modes of Intervention:  Discussion, Exploration and Socialization  Summary of Progress/Problems: The topic for group was balance in life.  Pt participated in the discussion about when their life was in balance and out of balance and how this feels.  Pt discussed ways to get back in balance and short term goals they can work on to get where they want to be. Head down, arms folded, eyes closed.  No spontaneous interaction.  However, he read what he wrote. "Recently I learned of my wife's situation.  In the past when we had fights I reverted to my adult voice which scares me.  I did not think I would be capable of such behavior.  And the money situation must be resolved."  Declined to comment further.   [name] 12/02/2014 1:13 PM

## 2014-12-03 LAB — GLUCOSE, CAPILLARY
Glucose-Capillary: 129 mg/dL — ABNORMAL HIGH (ref 70–99)
Glucose-Capillary: 119 mg/dL — ABNORMAL HIGH (ref 70–99)

## 2014-12-03 MED ORDER — LAMOTRIGINE 100 MG PO TABS: 100.0000 mg | ORAL_TABLET | Freq: Every day | ORAL | Status: DC

## 2014-12-03 MED ORDER — TRAZODONE HCL 100 MG PO TABS: 100.0000 mg | ORAL_TABLET | Freq: Every day | ORAL | Status: DC

## 2014-12-03 MED ORDER — ASPIRIN 81 MG PO TBEC: 81.0000 mg | DELAYED_RELEASE_TABLET | Freq: Every day | ORAL | Status: DC

## 2014-12-03 MED ORDER — LABETALOL HCL 200 MG PO TABS: 200.0000 mg | ORAL_TABLET | Freq: Two times a day (BID) | ORAL | Status: DC

## 2014-12-03 MED ORDER — POTASSIUM CHLORIDE ER 10 MEQ PO TBCR: 10.0000 meq | EXTENDED_RELEASE_TABLET | Freq: Two times a day (BID) | ORAL | Status: DC

## 2014-12-03 MED ORDER — FUROSEMIDE 40 MG PO TABS: 40.0000 mg | ORAL_TABLET | Freq: Two times a day (BID) | ORAL | Status: DC

## 2014-12-03 MED ORDER — HALOPERIDOL 5 MG PO TABS: 5.0000 mg | ORAL_TABLET | Freq: Every day | ORAL | Status: DC

## 2014-12-03 MED ORDER — CLONIDINE HCL 0.1 MG PO TABS: 0.1000 mg | ORAL_TABLET | Freq: Two times a day (BID) | ORAL | Status: DC

## 2014-12-03 MED ORDER — HYDROXYZINE HCL 25 MG PO TABS: 25.0000 mg | ORAL_TABLET | Freq: Four times a day (QID) | ORAL | Status: DC | PRN

## 2014-12-03 MED ORDER — AMLODIPINE BESYLATE 5 MG PO TABS: 5.0000 mg | ORAL_TABLET | Freq: Two times a day (BID) | ORAL | Status: DC

## 2014-12-03 MED ORDER — ALLOPURINOL 300 MG PO TABS: 300.0000 mg | ORAL_TABLET | Freq: Every day | ORAL | Status: DC

## 2014-12-03 MED ORDER — GABAPENTIN 400 MG PO CAPS: 400.0000 mg | ORAL_CAPSULE | Freq: Three times a day (TID) | ORAL | Status: DC

## 2014-12-03 MED ORDER — BENZTROPINE MESYLATE 0.5 MG PO TABS: 0.5000 mg | ORAL_TABLET | Freq: Every day | ORAL | Status: DC

## 2014-12-03 MED ORDER — SITAGLIPTIN PHOSPHATE 100 MG PO TABS: 100.0000 mg | ORAL_TABLET | Freq: Every day | ORAL | Status: DC

## 2014-12-03 MED ORDER — HYDRALAZINE HCL 100 MG PO TABS: 100.0000 mg | ORAL_TABLET | Freq: Three times a day (TID) | ORAL | Status: DC

## 2014-12-03 NOTE — Progress Notes (Signed)
Discharge note. Pt received both written and verbal discharge instructions by Jinny Sanders, RN. Pt received sample meds, AVS and prescriptions per Sharl Ma, Therapist, sports. Pt received belongings from room and locker. Pt sister received discharge packet as requested by Rod, CSW. Pt safely left [facility] with sister.

## 2014-12-03 NOTE — BHH Suicide Risk Assessment (Signed)
[facility] Discharge Suicide Risk Assessment   Demographic Factors:  Male  Total Time spent with patient: 30 minutes  Musculoskeletal: Strength & Muscle Tone: within normal limits Gait & Station: normal Patient leans: N/A  Psychiatric Specialty Exam: Physical Exam  Review of Systems  Psychiatric/Behavioral: Negative for depression, suicidal ideas, hallucinations and substance abuse. The patient is not nervous/anxious and does not have insomnia.     Blood pressure 119/73, pulse 71, temperature 98.6 F (37 C), temperature source Oral, resp. rate 20, height 6' 2.8" (1.9 m), weight 127.007 kg (280 lb), SpO2 99 %.Body mass index is 35.18 kg/(m^2).  General Appearance: Casual  Eye Contact::  Fair  Speech:  Clear and [id number]  Volume:  Normal  Mood:  Euthymic  Affect:  Appropriate  Thought Process:  Coherent  Orientation:  Full (Time, Place, and Person)  Thought Content:  WDL  Suicidal Thoughts:  No  Homicidal Thoughts:  No  Memory:  Immediate;   Fair Recent;   Fair Remote;   Fair  Judgement:  Fair  Insight:  Shallow  Psychomotor Activity:  Normal  Concentration:  Fair  Recall:  [employer] of Knowledge:Fair  Language: Fair  Akathisia:  No  Handed:  Right  AIMS (if indicated):     Assets:  Desire for Improvement Social Support  Sleep:  Number of Hours: 3.75  Cognition: WNL  ADL's:  Intact   Have you used any form of tobacco in the last 30 days? (Cigarettes, Smokeless Tobacco, Cigars, and/or Pipes): Yes  Has this patient used any form of tobacco in the last 30 days? (Cigarettes, Smokeless Tobacco, Cigars, and/or Pipes) Yes, A prescription for an FDA-approved tobacco cessation medication was offered at discharge and the patient refused  Mental Status Per Nursing Assessment::   On Admission:  NA  Current Mental Status by Physician: Pt denies SI/HI/AH/VH  Loss Factors: Patient with family and relational struggles.  Historical Factors: Impulsivity  Risk Reduction  Factors:   Sister is the legal guardian  Continued Clinical Symptoms:  Previous Psychiatric Diagnoses and Treatments Medical Diagnoses and Treatments/Surgeries  Cognitive Features That Contribute To Risk:  Closed-mindedness    Suicide Risk:  Minimal: No identifiable suicidal ideation.  Patients presenting with no risk factors but with morbid ruminations; may be classified as minimal risk based on the severity of the depressive symptoms  Principal Problem: Undifferentiated schizophrenia Discharge Diagnoses:  Patient Active Problem List   Diagnosis Date Noted  . Undifferentiated schizophrenia [F20.3] 11/29/2014  . Corneal abrasion [S05.00XA] 07/07/2014  . Weakness generalized [R53.1] 07/07/2014  . Left-sided weakness [M62.89]   . Trauma to eye [S05.90XA]   . Diabetes mellitus due to underlying condition with diabetic nephropathy [E08.21]   . CKD (chronic kidney disease) stage 3, GFR 30-59 ml/min [N18.3]   . Pedal edema [R60.0]   . Chronic pain syndrome [G89.4]   . Essential hypertension [I10]   . Edema of both legs [R60.0] 04/07/2013  . Diabetes [E11.9] 04/01/2013  . Essential hypertension, benign [I10] 04/01/2013      Plan Of Care/Follow-up recommendations:  Activity:  No restrictions Diet:  Carb modified , heart healthy Tests:  as needed Other:  follow up with after care  Is patient on multiple antipsychotic therapies at discharge:  No   Has Patient had three or more failed trials of antipsychotic monotherapy by history:  No  Recommended Plan for Multiple Antipsychotic Therapies: NA    Adler Alton MD 12/03/2014, 9:47 AM

## 2014-12-03 NOTE — Tx Team (Signed)
  Interdisciplinary Treatment Plan Update   Date Reviewed:  12/03/2014  Time Reviewed:  10:22 AM  Progress in Treatment:   Attending groups: Yes Participating in groups: Yes Taking medication as prescribed: Yes  Tolerating medication: Yes Family/Significant other contact made: Yes  Patient understands diagnosis: Yes  Discussing patient identified problems/goals with staff: Yes  See initial care plan Medical problems stabilized or resolved: Yes Denies suicidal/homicidal ideation: Yes  In tx team Patient has not harmed self or others: Yes  For review of initial/current patient goals, please see plan of care.  Estimated Length of Stay:  D/C today  Reason for Continuation of Hospitalization:   New Problems/Goals identified:  N/A  Discharge Plan or Barriers:   return home, referred to [facility]  Additional Comments:  Attendees:  Signature: Steva Colder, MD 12/03/2014 10:22 AM   Signature: Ripley Fraise, LCSW 12/03/2014 10:22 AM  Signature:  12/03/2014 10:22 AM  Signature: Mayra Neer, RN 12/03/2014 10:22 AM  Signature:  12/03/2014 10:22 AM  Signature:  12/03/2014 10:22 AM  Signature:   12/03/2014 10:22 AM  Signature:    Signature:    Signature:    Signature:    Signature:    Signature:      Scribe for Treatment Team:   Ripley Fraise, LCSW  12/03/2014 10:22 AM

## 2014-12-03 NOTE — Progress Notes (Signed)
Patient doing well today, states he is going home. Affect remains flat but mood is stable, pleasant. He does brighten during 1:1 interaction. He is cooperative and denies any concerns this morning. Medicated per orders, support given along with praise for his progress. Informed his sister would be here to pick him up between 2-3pm. Denies SI/HI/AVH and remains safe. Jamie Kato

## 2014-12-03 NOTE — Progress Notes (Signed)
  [facility] Adult Case Management Discharge Plan :  Will you be returning to the same living situation after discharge:  Yes,  home At discharge, do you have transportation home?: Yes,  family Do you have the ability to pay for your medications: Yes,  MCR  Release of information consent forms completed and in the chart;  Patient's signature needed at discharge.  Patient to Follow up at: Follow-up Information    Follow up with [facility].   Why:  Your sister is working with them to identify a time and place to meet.   Contact information:   [address]  [place] [id number]      Patient denies SI/HI: Yes,  yes    Safety Planning and Suicide Prevention discussed: Yes,  yes  Have you used any form of tobacco in the last 30 days? (Cigarettes, Smokeless Tobacco, Cigars, and/or Pipes): Yes  Has patient been referred to the Quitline?: Patient refused referral  Trish Mage 12/03/2014, 10:20 AM

## 2014-12-03 NOTE — Discharge Summary (Signed)
Physician Discharge Summary Note  Patient:  Ivan [name] is an 54 y.o., male MRN:  [id number] DOB:  [birth year] Patient phone:  [phone] (home)  Patient address:   [address] [place] [address],  Total Time spent with patient: 30 minutes  Date of Admission:  11/28/2014 Date of Discharge: 12/03/2014  Reason for Admission:  Bizarre behavior  Principal Problem: Undifferentiated schizophrenia Discharge Diagnoses: Patient Active Problem List   Diagnosis Date Noted  . Undifferentiated schizophrenia [F20.3] 11/29/2014  . Corneal abrasion [S05.00XA] 07/07/2014  . Weakness generalized [R53.1] 07/07/2014  . Left-sided weakness [M62.89]   . Trauma to eye [S05.90XA]   . Diabetes mellitus due to underlying condition with diabetic nephropathy [E08.21]   . CKD (chronic kidney disease) stage 3, GFR 30-59 ml/min [N18.3]   . Pedal edema [R60.0]   . Chronic pain syndrome [G89.4]   . Essential hypertension [I10]   . Edema of both legs [R60.0] 04/07/2013  . Diabetes [E11.9] 04/01/2013  . Essential hypertension, benign [I10] 04/01/2013    Musculoskeletal: Strength & Muscle Tone: within normal limits Gait & Station: normal Patient leans: N/A  Psychiatric Specialty Exam:  SEE SRA Physical Exam  Vitals reviewed. Psychiatric: His mood appears anxious.    Review of Systems  Psychiatric/Behavioral: The patient is nervous/anxious.   All other systems reviewed and are negative.   Blood pressure 128/75, pulse 71, temperature 98.6 F (37 C), temperature source Oral, resp. rate 20, height 6' 2.8" (1.9 m), weight 127.007 kg (280 lb), SpO2 99 %.Body mass index is 35.18 kg/(m^2).   Past Medical History:  Past Medical History  Diagnosis Date  . Diabetes mellitus   . Hypertension   . Bipolar 1 disorder   . Schizoaffective disorder   . Hypokalemia 04/02/2013  . Edema of both legs 04/07/2013  . Paranoid schizophrenia     Past Surgical History  Procedure Laterality Date  . Breast surgery     . Fracture surgery      ribs and arm   Family History: History reviewed. No pertinent family history. Social History:  History  Alcohol Use No     History  Drug Use No    History   Social History  . Marital Status: Divorced    Spouse Name: N/A  . Number of Children: N/A  . Years of Education: N/A   Social History Main Topics  . Smoking status: Current Every Day Smoker -- 0.50 packs/day for 39 years    Types: Cigarettes, Cigars  . Smokeless tobacco: Never Used  . Alcohol Use: No  . Drug Use: No  . Sexual Activity: No   Other Topics Concern  . None   Social History Narrative   Risk to Self: Is patient at risk for suicide?: No Risk to Others:   Prior Inpatient Therapy:   Prior Outpatient Therapy:    Level of Care:  OP  Hospital Course:  Ivan [name] is a 54 y.o. AA male with a history of Diabetes Mellitus,Hypertension, Chronic back pain, Pedal edema, Hx of Elevated LFTs due to Depakote, CKD Stage [name] as well as schizophrenia who presented to the Emergency Department IVC ed by his sister. Per initial notes in EHR - patient admitted with bizarre behaviors, agitation, paranoia, and refused to cooperate with ED providers,and was reportedly agitated/confused and even (per sister's report) took all his clothes off, walked into a random person's garage and was lying on the floor nude. Per initial collateral information from sister, the patient is "getting worse and  going down the path again where he needs to come in.  He had not been taking his meds." Pt denies any AH/VH/Paranoia. Pt denies any mood sx. Pt denies any substance abuse. He is well known to the team at [facility] [place] has had several hospitalizations in the past , last one was in November 2015 - Pt is usually compliant on medications while here, but on discharge stops taking his medications and decompensates. Pt has multiple medical issues including cardiac hx, hence not a good candidate for LAI.  Ivan [name] was  admitted for Undifferentiated schizophrenia and crisis management.  He was treated discharged with the medications listed below under Medication List.  Medical problems were identified and treated as needed.  Home medications were restarted as appropriate.  Improvement was monitored by observation and Ivan [name] daily report of symptom reduction.  Emotional and mental status was monitored by daily self-inventory reports completed by Ivan [name] and clinical staff.         Ivan [name] was evaluated by the treatment team for stability and plans for continued recovery upon discharge.  Ivan [name] motivation was an integral factor for scheduling further treatment.  Employment, transportation, bed availability, health status, family support, and any pending legal issues were also considered during his hospital stay.  He was offered further treatment options upon discharge including but not limited to Residential, Intensive Outpatient, and Outpatient treatment.  Ivan [name] will follow up with the services as listed below under Follow Up Information.     Upon completion of this admission the patient was both mentally and medically stable for discharge denying suicidal/homicidal ideation, auditory/visual/tactile hallucinations, delusional thoughts and paranoia.      Consults:  psychiatry  Significant Diagnostic Studies:  labs: per lab  Discharge Vitals:   Blood pressure 128/75, pulse 71, temperature 98.6 F (37 C), temperature source Oral, resp. rate 20, height 6' 2.8" (1.9 m), weight 127.007 kg (280 lb), SpO2 99 %. Body mass index is 35.18 kg/(m^2). Lab Results:   Results for orders placed or performed during the hospital encounter of 11/28/14 (from the past 72 hour(s))  Glucose, capillary     Status: Abnormal   Collection Time: 11/30/14  4:52 PM  Result Value Ref Range   Glucose-Capillary 105 (H) 70 - 99 mg/dL   Comment 1 Notify RN    Comment 2 Document in Chart   Glucose, capillary      Status: Abnormal   Collection Time: 11/30/14  9:21 PM  Result Value Ref Range   Glucose-Capillary 108 (H) 70 - 99 mg/dL  Glucose, capillary     Status: Abnormal   Collection Time: 12/01/14  5:33 AM  Result Value Ref Range   Glucose-Capillary 103 (H) 70 - 99 mg/dL  Glucose, capillary     Status: Abnormal   Collection Time: 12/01/14 11:57 AM  Result Value Ref Range   Glucose-Capillary 100 (H) 70 - 99 mg/dL   Comment 1 Notify RN   Glucose, capillary     Status: None   Collection Time: 12/01/14  4:53 PM  Result Value Ref Range   Glucose-Capillary 99 70 - 99 mg/dL  Glucose, capillary     Status: Abnormal   Collection Time: 12/01/14  8:57 PM  Result Value Ref Range   Glucose-Capillary 108 (H) 70 - 99 mg/dL   Comment 1 Notify RN    Comment 2 Document in Chart   Glucose, capillary     Status: Abnormal   Collection Time: 12/02/14  6:05  AM  Result Value Ref Range   Glucose-Capillary 109 (H) 70 - 99 mg/dL  Glucose, capillary     Status: None   Collection Time: 12/02/14 11:52 AM  Result Value Ref Range   Glucose-Capillary 98 70 - 99 mg/dL  Glucose, capillary     Status: Abnormal   Collection Time: 12/02/14  5:09 PM  Result Value Ref Range   Glucose-Capillary 111 (H) 70 - 99 mg/dL   Comment 1 Notify RN    Comment 2 Document in Chart   Basic metabolic panel     Status: Abnormal   Collection Time: 12/02/14  7:40 PM  Result Value Ref Range   Sodium 140 135 - 145 mmol/L   Potassium 3.2 (L) 3.5 - 5.1 mmol/L   Chloride 99 96 - 112 mmol/L   CO2 31 19 - 32 mmol/L   Glucose, Bld 124 (H) 70 - 99 mg/dL   BUN 20 6 - 23 mg/dL   Creatinine, Ser 1.88 (H) 0.50 - 1.35 mg/dL   Calcium 9.2 8.4 - 10.5 mg/dL   GFR calc non Af Amer 39 (L) >90 mL/min   GFR calc Af Amer 46 (L) >90 mL/min    Comment: (NOTE) The eGFR has been calculated using the CKD EPI equation. This calculation has not been validated in all clinical situations. eGFR's persistently <90 mL/min signify possible Chronic  Kidney Disease.    Anion gap 10 5 - 15    Comment: Performed at [facility]  Glucose, capillary     Status: Abnormal   Collection Time: 12/02/14  8:00 PM  Result Value Ref Range   Glucose-Capillary 130 (H) 70 - 99 mg/dL  Glucose, capillary     Status: Abnormal   Collection Time: 12/03/14  6:06 AM  Result Value Ref Range   Glucose-Capillary 129 (H) 70 - 99 mg/dL  Glucose, capillary     Status: Abnormal   Collection Time: 12/03/14 11:48 AM  Result Value Ref Range   Glucose-Capillary 119 (H) 70 - 99 mg/dL   Comment 1 Notify RN    Comment 2 Document in Chart     Physical Findings: AIMS: Facial and Oral Movements Muscles of Facial Expression: None, normal Lips and Perioral Area: None, normal Jaw: None, normal Tongue: None, normal,Extremity Movements Upper (arms, wrists, hands, fingers): None, normal Lower (legs, knees, ankles, toes): None, normal, Trunk Movements Neck, shoulders, hips: None, normal, Overall Severity Severity of abnormal movements (highest score from questions above): None, normal Incapacitation due to abnormal movements: None, normal Patient's awareness of abnormal movements (rate only patient's report): No Awareness, Dental Status Current problems with teeth and/or dentures?: No Does patient usually wear dentures?: No  CIWA:    COWS:      See Psychiatric Specialty Exam and Suicide Risk Assessment completed by Attending Physician prior to discharge.  Discharge destination:  Home  Is patient on multiple antipsychotic therapies at discharge:  No   Has Patient had three or more failed trials of antipsychotic monotherapy by history:  No    Recommended Plan for Multiple Antipsychotic Therapies: NA     Medication List    STOP taking these medications        ARTIFICIAL TEAR SOLUTION OP     glipiZIDE 5 MG 24 hr tablet  Commonly known as:  GLUCOTROL XL     HYDROcodone-acetaminophen 5-325 MG per tablet  Commonly known as:   NORCO/VICODIN      TAKE these medications      Indication  allopurinol 300 MG tablet  Commonly known as:  ZYLOPRIM  Take 1 tablet (300 mg total) by mouth daily.   Indication:  Primary Gout     amLODipine 5 MG tablet  Commonly known as:  NORVASC  Take 1 tablet (5 mg total) by mouth 2 (two) times daily.   Indication:  High Blood Pressure     aspirin 81 MG EC tablet  Take 1 tablet (81 mg total) by mouth daily.   Indication:  Heart Attack     benztropine 0.5 MG tablet  Commonly known as:  COGENTIN  Take 1 tablet (0.5 mg total) by mouth daily.   Indication:  Extrapyramidal Reaction caused by Medications     cloNIDine 0.1 MG tablet  Commonly known as:  CATAPRES  Take 1 tablet (0.1 mg total) by mouth 2 (two) times daily.   Indication:  High Blood Pressure     furosemide 40 MG tablet  Commonly known as:  LASIX  Take 1 tablet (40 mg total) by mouth 2 (two) times daily.   Indication:  High Blood Pressure     gabapentin 400 MG capsule  Commonly known as:  NEURONTIN  Take 1 capsule (400 mg total) by mouth 3 (three) times daily.   Indication:  Agitation, Pain, Mood control     haloperidol 5 MG tablet  Commonly known as:  HALDOL  Take 1 tablet (5 mg total) by mouth daily.   Indication:  Mood Stabilization     hydrALAZINE 100 MG tablet  Commonly known as:  APRESOLINE  Take 1 tablet (100 mg total) by mouth 3 (three) times daily.   Indication:  High Blood Pressure     hydrOXYzine 25 MG tablet  Commonly known as:  ATARAX/VISTARIL  Take 1 tablet (25 mg total) by mouth every 6 (six) hours as needed for anxiety (sleep).   Indication:  Pain     labetalol 200 MG tablet  Commonly known as:  NORMODYNE  Take 1 tablet (200 mg total) by mouth 2 (two) times daily.   Indication:  High Blood Pressure     lamoTRIgine 100 MG tablet  Commonly known as:  LAMICTAL  Take 1 tablet (100 mg total) by mouth daily.   Indication:  Mood Disorder     potassium chloride 10 MEQ tablet  Commonly  known as:  K-DUR  Take 1 tablet (10 mEq total) by mouth 2 (two) times daily.   Indication:  Low Amount of Potassium in the Blood     sitaGLIPtin 100 MG tablet  Commonly known as:  JANUVIA  Take 1 tablet (100 mg total) by mouth daily.   Indication:  Type 2 Diabetes     traZODone 100 MG tablet  Commonly known as:  DESYREL  Take 1 tablet (100 mg total) by mouth at bedtime.   Indication:  Trouble Sleeping           Follow-up Information    Follow up with [facility].   Why:  Your sister is working with them to identify a time and place to meet.   Contact information:   [place] [phone]      Follow-up recommendations:  Activity:  as tol, diet as tol  Comments:  1.  Take all your medications as prescribed.              2.  Report any adverse side effects to outpatient provider.  3.  Patient instructed to not use alcohol or illegal drugs while on prescription medicines.            4.  In the event of worsening symptoms, instructed patient to call 911, the crisis hotline or go to nearest emergency room for evaluation of symptoms.  Total Discharge Time:  30 min  Signed: Janett Labella, AGNP-BC 12/03/2014, 12:18 PM

## 2014-12-07 NOTE — Progress Notes (Signed)
Patient Discharge Instructions:  After Visit Summary (AVS):   Faxed to:  12/07/14 Discharge Summary Note:   Faxed to:  12/07/14 Psychiatric Admission Assessment Note:   Faxed to:  12/07/14 Suicide Risk Assessment - Discharge Assessment:   Faxed to:  12/07/14 Faxed/Sent to the Next Level Care provider:  12/07/14 Faxed to [facility] [place], 12/07/2014, 3:49 PM

## 2015-01-03 ENCOUNTER — Encounter (HOSPITAL_COMMUNITY): Payer: Self-pay

## 2015-01-03 ENCOUNTER — Emergency Department (EMERGENCY_DEPARTMENT_HOSPITAL)
Admission: EM | Admit: 2015-01-03 | Discharge: 2015-01-04 | Disposition: A | Discharge status: Other Acute Inpatient Hospital | Payer: 59 | Source: Home / Self Care | Attending: Emergency Medicine | Admitting: Emergency Medicine

## 2015-01-03 DIAGNOSIS — I10 Essential (primary) hypertension: Secondary | ICD-10-CM | POA: Insufficient documentation

## 2015-01-03 DIAGNOSIS — Z720 Tobacco use: Secondary | ICD-10-CM

## 2015-01-03 DIAGNOSIS — F250 Schizoaffective disorder, bipolar type: Secondary | ICD-10-CM | POA: Diagnosis present

## 2015-01-03 DIAGNOSIS — Z79899 Other long term (current) drug therapy: Secondary | ICD-10-CM

## 2015-01-03 DIAGNOSIS — G894 Chronic pain syndrome: Secondary | ICD-10-CM | POA: Diagnosis present

## 2015-01-03 DIAGNOSIS — E119 Type 2 diabetes mellitus without complications: Secondary | ICD-10-CM | POA: Insufficient documentation

## 2015-01-03 DIAGNOSIS — Z7982 Long term (current) use of aspirin: Secondary | ICD-10-CM | POA: Insufficient documentation

## 2015-01-03 DIAGNOSIS — Z9114 Patient's other noncompliance with medication regimen: Secondary | ICD-10-CM | POA: Diagnosis present

## 2015-01-03 DIAGNOSIS — E876 Hypokalemia: Secondary | ICD-10-CM

## 2015-01-03 DIAGNOSIS — F209 Schizophrenia, unspecified: Secondary | ICD-10-CM | POA: Diagnosis not present

## 2015-01-03 DIAGNOSIS — F200 Paranoid schizophrenia: Principal | ICD-10-CM | POA: Diagnosis present

## 2015-01-03 DIAGNOSIS — N183 Chronic kidney disease, stage 3 (moderate): Secondary | ICD-10-CM | POA: Diagnosis present

## 2015-01-03 DIAGNOSIS — I129 Hypertensive chronic kidney disease with stage 1 through stage 4 chronic kidney disease, or unspecified chronic kidney disease: Secondary | ICD-10-CM | POA: Diagnosis present

## 2015-01-03 DIAGNOSIS — F17210 Nicotine dependence, cigarettes, uncomplicated: Secondary | ICD-10-CM | POA: Diagnosis present

## 2015-01-03 DIAGNOSIS — F319 Bipolar disorder, unspecified: Secondary | ICD-10-CM | POA: Insufficient documentation

## 2015-01-03 LAB — I-STAT CHEM 8, ED
BUN: 17 mg/dL (ref 6–20)
CALCIUM ION: 1.28 mmol/L — AB (ref 1.12–1.23)
Chloride: 107 mmol/L (ref 101–111)
Creatinine, Ser: 1.4 mg/dL — ABNORMAL HIGH (ref 0.61–1.24)
Glucose, Bld: 119 mg/dL — ABNORMAL HIGH (ref 65–99)
HEMATOCRIT: 36 % — AB (ref 39.0–52.0)
HEMOGLOBIN: 12.2 g/dL — AB (ref 13.0–17.0)
Potassium: 3.6 mmol/L (ref 3.5–5.1)
Sodium: 144 mmol/L (ref 135–145)
TCO2: 21 mmol/L (ref 0–100)

## 2015-01-03 LAB — RAPID URINE DRUG SCREEN, HOSP PERFORMED
AMPHETAMINES: NOT DETECTED
Barbiturates: NOT DETECTED
Benzodiazepines: NOT DETECTED
COCAINE: NOT DETECTED
Opiates: NOT DETECTED
Tetrahydrocannabinol: NOT DETECTED

## 2015-01-03 LAB — SALICYLATE LEVEL: Salicylate Lvl: <4 mg/dL (ref 2.8–30.0)

## 2015-01-03 LAB — ACETAMINOPHEN LEVEL

## 2015-01-03 LAB — ETHANOL: Alcohol, Ethyl (B): <5 mg/dL (ref <5)

## 2015-01-03 MED ORDER — ALUM & MAG HYDROXIDE-SIMETH 200-200-20 MG/5ML PO SUSP: 30.0000 mL | ORAL | Status: DC | PRN

## 2015-01-03 MED ORDER — CLONIDINE HCL 0.2 MG PO TABS
0.2000 mg | ORAL_TABLET | Freq: Once | ORAL | Status: AC
  Administered 2015-01-03: 0.2 mg via ORAL
  Filled 2015-01-03: qty 1

## 2015-01-03 MED ORDER — ZOLPIDEM TARTRATE 5 MG PO TABS: 5.0000 mg | ORAL_TABLET | Freq: Every evening | ORAL | Status: DC | PRN

## 2015-01-03 MED ORDER — ACETAMINOPHEN 325 MG PO TABS: 650.0000 mg | ORAL_TABLET | ORAL | Status: DC | PRN

## 2015-01-03 MED ORDER — LORAZEPAM 1 MG PO TABS: 1.0000 mg | ORAL_TABLET | Freq: Three times a day (TID) | ORAL | Status: DC | PRN

## 2015-01-03 MED ORDER — ONDANSETRON HCL 4 MG PO TABS: 4.0000 mg | ORAL_TABLET | Freq: Three times a day (TID) | ORAL | Status: DC | PRN

## 2015-01-03 MED ORDER — HALOPERIDOL 5 MG PO TABS
5.0000 mg | ORAL_TABLET | Freq: Once | ORAL | Status: AC
  Administered 2015-01-03: 5 mg via ORAL
  Filled 2015-01-03: qty 1

## 2015-01-03 NOTE — ED Notes (Signed)
Pt requested shower. Pt was assisted by [employer] and Security to the shower room.

## 2015-01-03 NOTE — ED Notes (Signed)
Per [facility],   Pt refused to be evaluated by TTS at this time. TTS will re-assess him in the morning.

## 2015-01-03 NOTE — ED Notes (Signed)
Pt talked with Dr. And nurse and states, "I've been to [facility], send me there again, I've got friends there."  Pt is calm but easily agitated when speaking with the Doctor. Pt denies wanting to hurt himself or others but expresses that he isn't happy were he is.

## 2015-01-03 NOTE — BH Assessment (Signed)
[facility] Assessment Progress Note      Received request for teleassessment.  Contacted EDRN, she reports pt is in hallway and will need to be relocated.  Will connect in 10 minutes.

## 2015-01-03 NOTE — ED Provider Notes (Signed)
CSN: [id number]     Arrival date & time 01/03/15  1444 History   First MD Initiated Contact with Patient 01/03/15 1513     Chief Complaint  Patient presents with  . V70.1      HPI Pt states, "I've been to [place], send me there again, I've got friends there." Pt is calm but easily agitated when speaking with me. Pt denies wanting to hurt himself or others but expresses that he isn't happy were he is. Accompanied by police  Was acting strange  Past Medical History  Diagnosis Date  . Diabetes mellitus   . Hypertension   . Bipolar 1 disorder   . Schizoaffective disorder   . Hypokalemia 04/02/2013  . Edema of both legs 04/07/2013  . Paranoid schizophrenia    Past Surgical History  Procedure Laterality Date  . Breast surgery    . Fracture surgery      ribs and arm   Family History  Problem Relation Age of Onset  . Depression Neg Hx    History  Substance Use Topics  . Smoking status: Current Every Day Smoker -- 0.50 packs/day for 39 years    Types: Cigarettes, Cigars  . Smokeless tobacco: Never Used  . Alcohol Use: No    Review of Systems  Unable to perform ROS: Psychiatric disorder      Allergies  Bee venom  Home Medications   Prior to Admission medications   Medication Sig Start Date End Date Taking? Authorizing Provider  allopurinol (ZYLOPRIM) 300 MG tablet Take 1 tablet (300 mg total) by mouth daily. 12/03/14  Yes Kerrie Buffalo, NP  amLODipine (NORVASC) 5 MG tablet Take 1 tablet (5 mg total) by mouth 2 (two) times daily. 12/03/14  Yes Kerrie Buffalo, NP  aspirin EC 81 MG EC tablet Take 1 tablet (81 mg total) by mouth daily. 12/03/14  Yes Kerrie Buffalo, NP  benztropine (COGENTIN) 0.5 MG tablet Take 1 tablet (0.5 mg total) by mouth daily. 12/03/14  Yes Kerrie Buffalo, NP  cloNIDine (CATAPRES) 0.1 MG tablet Take 1 tablet (0.1 mg total) by mouth 2 (two) times daily. 12/03/14  Yes Kerrie Buffalo, NP  furosemide (LASIX) 40 MG tablet Take 1 tablet (40 mg total) by mouth 2  (two) times daily. 12/03/14  Yes Kerrie Buffalo, NP  gabapentin (NEURONTIN) 400 MG capsule Take 1 capsule (400 mg total) by mouth 3 (three) times daily. 12/03/14  Yes Kerrie Buffalo, NP  haloperidol (HALDOL) 5 MG tablet Take 1 tablet (5 mg total) by mouth daily. 12/03/14  Yes Kerrie Buffalo, NP  hydrALAZINE (APRESOLINE) 100 MG tablet Take 1 tablet (100 mg total) by mouth 3 (three) times daily. 12/03/14  Yes Kerrie Buffalo, NP  hydrOXYzine (ATARAX/VISTARIL) 25 MG tablet Take 1 tablet (25 mg total) by mouth every 6 (six) hours as needed for anxiety (sleep). 12/03/14  Yes Kerrie Buffalo, NP  labetalol (NORMODYNE) 200 MG tablet Take 1 tablet (200 mg total) by mouth 2 (two) times daily. 12/03/14  Yes Kerrie Buffalo, NP  lamoTRIgine (LAMICTAL) 100 MG tablet Take 1 tablet (100 mg total) by mouth daily. 12/03/14  Yes Kerrie Buffalo, NP  potassium chloride (K-DUR) 10 MEQ tablet Take 1 tablet (10 mEq total) by mouth 2 (two) times daily. 12/03/14  Yes Kerrie Buffalo, NP  sitaGLIPtin (JANUVIA) 100 MG tablet Take 1 tablet (100 mg total) by mouth daily. 12/03/14  Yes Kerrie Buffalo, NP  traZODone (DESYREL) 100 MG tablet Take 1 tablet (100 mg total) by mouth at bedtime. 12/03/14  Yes Kerrie Buffalo, NP   BP 170/104 mmHg  Pulse 63  Temp(Src) 97.5 F (36.4 C) (Oral)  Resp 18  SpO2 100% Physical Exam  Constitutional: He is oriented to person, place, and time. He appears well-developed and well-nourished. No distress.  HENT:  Head: Normocephalic and atraumatic.  Eyes: Pupils are equal, round, and reactive to light.  Neck: Normal range of motion.  Cardiovascular: Normal rate and intact distal pulses.   Pulmonary/Chest: No respiratory distress.  Abdominal: Normal appearance. He exhibits no distension.  Musculoskeletal: Normal range of motion.  Neurological: He is alert and oriented to person, place, and time. No cranial nerve deficit.  Skin: Skin is warm and dry. No rash noted.  Psychiatric: His speech is normal. His  affect is angry, blunt and labile. He is agitated. He expresses impulsivity. He expresses no homicidal and no suicidal ideation.  Nursing note and vitals reviewed.   ED Course  Procedures (including critical care time) Labs Review Labs Reviewed  ACETAMINOPHEN LEVEL - Abnormal; Notable for the following:    Acetaminophen (Tylenol), Serum <10 (*)    All other components within normal limits  I-STAT CHEM 8, ED - Abnormal; Notable for the following:    Creatinine, Ser 1.40 (*)    Glucose, Bld 119 (*)    Calcium, Ion 1.28 (*)    Hemoglobin 12.2 (*)    HCT 36.0 (*)    All other components within normal limits  CBG MONITORING, ED - Abnormal; Notable for the following:    Glucose-Capillary 134 (*)    All other components within normal limits  ETHANOL  URINE RAPID DRUG SCREEN (HOSP PERFORMED)  SALICYLATE LEVEL    Imaging Review No results found.   EKG Interpretation None      MDM   Final diagnoses:  None        Leonard Schwartz, MD 01/06/15 [phone]

## 2015-01-03 NOTE — ED Notes (Signed)
Pt is agitated at this time, providing short and cut off responses to the nurse. Nurse will wait to assess pt when EDP is doing assessment at bedside. Pt has 2 sheriffs at bedside.

## 2015-01-03 NOTE — ED Notes (Signed)
Per EMS, called out for patient standing in his yard yelling. Pt transported here for psych evaluation

## 2015-01-03 NOTE — BH Assessment (Signed)
Tele Assessment Note   Ivan [name] is an 54 y.o. male brought to [facility] ED by EMS after he was found yelling in his front yard.  He told the ED staff that he did not want to harm himself or others, but hat he was not happy where he was.  UPon assessment he presents as somewhat disheveled with fair eye contact and loud speech.  His mood was angry and his affect was congruent.  He said, "Just send me to [facility].  That's the best place I could go.  They have good doctors there."  This writer explained that I needed to make a referral to [facility] and thus needed to get the answers to specific questions.  He replied, "that's all i'm going to say.  You can call them or give me the number and I'll call them myself.  I don't want to hurt any body, but I need to go there."  He refused to answer any more questions or say anything more stating he would talk to a lawyer if need be.  This Probation officer consulted with Waylan Boga, [place], who reccommended monitoring overnight and reevaluation in the morning. ED staff notified.   Axis I: Rule out Major Depression Axis II: Deferred Axis III:  Past Medical History  Diagnosis Date  . Diabetes mellitus   . Hypertension   . Bipolar 1 disorder   . Schizoaffective disorder   . Hypokalemia 04/02/2013  . Edema of both legs 04/07/2013  . Paranoid schizophrenia    Axis IV: problems with access to health care services Axis V: 41-50 serious symptoms  Past Medical History:  Past Medical History  Diagnosis Date  . Diabetes mellitus   . Hypertension   . Bipolar 1 disorder   . Schizoaffective disorder   . Hypokalemia 04/02/2013  . Edema of both legs 04/07/2013  . Paranoid schizophrenia     Past Surgical History  Procedure Laterality Date  . Breast surgery    . Fracture surgery      ribs and arm    Family History: No family history on file.  Social History:  reports that he has been smoking Cigarettes and Cigars.  He has a 19.5 pack-year smoking history. He has  never used smokeless tobacco. He reports that he does not drink alcohol or use illicit drugs.  Additional Social History:  Alcohol / Drug Use Longest period of sobriety (when/how long): UTA  CIWA: CIWA-Ar BP: (!) 212/111 mmHg Pulse Rate: 71 COWS:    PATIENT STRENGTHS: (choose at least two) General fund of knowledge  Allergies:  Allergies  Allergen Reactions  . Bee Venom Swelling    Home Medications:  (Not in a hospital admission)  OB/GYN Status:  No LMP for male patient.  General Assessment Data Location of Assessment: [facility] TTS Assessment: In system Is this a Tele or Face-to-Face Assessment?: Tele Assessment Is this an Initial Assessment or a Re-assessment for this encounter?: Initial Assessment Marital status: Single Is patient pregnant?: No Pregnancy Status: No Living Arrangements: Alone Can pt return to current living arrangement?: Yes Admission Status: Voluntary Is patient capable of signing voluntary admission?:  (UTA) Referral Source:  (EMT) Insurance type: [place] Screening Exam ([place]) Medical Exam completed: Yes  Crisis Care Plan Living Arrangements: Alone Name of Psychiatrist: [place] Name of Therapist: UTA  Education Status Is patient currently in school?: No Current Grade: UTA Highest grade of school patient has completed: [employer] Name of school: [employer] person:  UTA  Risk to self with the past 6 months Suicidal Ideation:  (UTA) Has patient been a risk to self within the past 6 months prior to admission? :  (UTA) Suicidal Intent:  (UTA) Has patient had any suicidal intent within the past 6 months prior to admission? :  (UTA) Is patient at risk for suicide?:  (UTA) Suicidal Plan?:  (UTA) Has patient had any suicidal plan within the past 6 months prior to admission? :  (UTA) Access to Means:  (UTA) What has been your use of drugs/alcohol within the last 12 months?: UTA Previous Attempts/Gestures:  (UTA) Other Self Harm Risks:  UTA Triggers for Past Attempts: Unknown Intentional Self Injurious Behavior:  (UTA) Family Suicide History: Unable to assess Recent stressful life event(s):  (UTA) Persecutory voices/beliefs?:  (UTA) Depression: Yes Depression Symptoms: Feeling worthless/self pity, Feeling angry/irritable Substance abuse history and/or treatment for substance abuse?:  (UTA) Suicide prevention information given to non-admitted patients: Not applicable  Risk to Others within the past 6 months Homicidal Ideation: No Does patient have any lifetime risk of violence toward others beyond the six months prior to admission? : No Thoughts of Harm to Others: No Current Homicidal Intent: No Current Homicidal Plan: No Access to Homicidal Means: No History of harm to others?:  (UTA) Assessment of Violence:  (UTA) Violent Behavior Description: UTA Does patient have access to weapons?:  (UTA) Criminal Charges Pending?:  (UTA) Does patient have a court date:  (UTA) Is patient on probation?:  (UTA)  Psychosis Hallucinations:  (UTA) Delusions:  (UTA)  Mental Status Report Appearance/Hygiene: Unremarkable Eye Contact: Good Motor Activity: Freedom of movement Speech: Aggressive, Argumentative Level of Consciousness: Alert Mood: Angry Affect: Angry Anxiety Level:  (UTA) Thought Processes: Unable to Assess Judgement: Unable to Assess Orientation: Unable to assess Obsessive Compulsive Thoughts/Behaviors: Unable to Assess  Cognitive Functioning Concentration: Unable to Assess Memory: Unable to Assess IQ: Average Insight: Unable to Assess Impulse Control: Unable to Assess Appetite:  (UTA) Sleep: Unable to Assess Vegetative Symptoms: Unable to Assess  ADLScreening [facility] Assessment Services) Patient's cognitive ability adequate to safely complete daily activities?:  (UTA) Patient able to express need for assistance with ADLs?: Yes Independently performs ADLs?: Yes (appropriate for developmental  age)  Prior Inpatient Therapy Prior Inpatient Therapy: Yes Prior Therapy Dates: 11/2014 Prior Therapy Facilty/Provider(s): [facility] Reason for Treatment: unk     ADL Screening (condition at time of admission) Patient's cognitive ability adequate to safely complete daily activities?:  (UTA) Patient able to express need for assistance with ADLs?: Yes Independently performs ADLs?: Yes (appropriate for developmental age)       Abuse/Neglect Assessment (Assessment to be complete while patient is alone) Physical Abuse:  (UTA) Verbal Abuse:  (UTA) Sexual Abuse:  (UTA)     Advance Directives (For Healthcare) Does patient have an advance directive?:  (Would not answer)    Additional Information 1:1 In Past 12 Months?: No CIRT Risk: Yes Elopement Risk: Yes Does patient have medical clearance?: Yes     Disposition:  Disposition Initial Assessment Completed for this Encounter: Yes Disposition of Patient: Other dispositions Other disposition(s): Other (Comment) (Continue to monitor and reevaluate in the morning)  Darlys Gales 01/03/2015 4:25 PM

## 2015-01-03 NOTE — ED Notes (Signed)
Patient moved into room 15; patient ambulated to bathroom.  [facility] remains with patient.

## 2015-01-04 ENCOUNTER — Inpatient Hospital Stay (HOSPITAL_COMMUNITY)
Admission: AD | Admit: 2015-01-04 | Discharge: 2015-01-14 | DRG: 885 | Disposition: A | Discharge status: Other Acute Inpatient Hospital | Payer: 59 | Source: Intra-hospital | Attending: Psychiatry | Admitting: Psychiatry

## 2015-01-04 ENCOUNTER — Encounter (HOSPITAL_COMMUNITY): Payer: Self-pay

## 2015-01-04 DIAGNOSIS — F250 Schizoaffective disorder, bipolar type: Secondary | ICD-10-CM | POA: Diagnosis not present

## 2015-01-04 DIAGNOSIS — F17210 Nicotine dependence, cigarettes, uncomplicated: Secondary | ICD-10-CM | POA: Diagnosis present

## 2015-01-04 DIAGNOSIS — I129 Hypertensive chronic kidney disease with stage 1 through stage 4 chronic kidney disease, or unspecified chronic kidney disease: Secondary | ICD-10-CM | POA: Diagnosis present

## 2015-01-04 DIAGNOSIS — F200 Paranoid schizophrenia: Secondary | ICD-10-CM | POA: Diagnosis present

## 2015-01-04 DIAGNOSIS — N183 Chronic kidney disease, stage 3 (moderate): Secondary | ICD-10-CM | POA: Diagnosis present

## 2015-01-04 DIAGNOSIS — Z79899 Other long term (current) drug therapy: Secondary | ICD-10-CM | POA: Diagnosis not present

## 2015-01-04 DIAGNOSIS — I10 Essential (primary) hypertension: Secondary | ICD-10-CM

## 2015-01-04 DIAGNOSIS — F209 Schizophrenia, unspecified: Secondary | ICD-10-CM | POA: Diagnosis present

## 2015-01-04 DIAGNOSIS — E108 Type 1 diabetes mellitus with unspecified complications: Secondary | ICD-10-CM

## 2015-01-04 DIAGNOSIS — G894 Chronic pain syndrome: Secondary | ICD-10-CM | POA: Diagnosis present

## 2015-01-04 DIAGNOSIS — E1065 Type 1 diabetes mellitus with hyperglycemia: Secondary | ICD-10-CM

## 2015-01-04 DIAGNOSIS — Z9114 Patient's other noncompliance with medication regimen: Secondary | ICD-10-CM | POA: Diagnosis present

## 2015-01-04 DIAGNOSIS — E119 Type 2 diabetes mellitus without complications: Secondary | ICD-10-CM | POA: Diagnosis present

## 2015-01-04 DIAGNOSIS — IMO0002 Reserved for concepts with insufficient information to code with codable children: Secondary | ICD-10-CM

## 2015-01-04 LAB — GLUCOSE, CAPILLARY: Glucose-Capillary: 119 mg/dL — ABNORMAL HIGH (ref 65–99)

## 2015-01-04 LAB — CBG MONITORING, ED: Glucose-Capillary: 134 mg/dL — ABNORMAL HIGH (ref 65–99)

## 2015-01-04 MED ORDER — MAGNESIUM HYDROXIDE 400 MG/5ML PO SUSP: 30.0000 mL | Freq: Every day | ORAL | Status: DC | PRN

## 2015-01-04 MED ORDER — HYDROXYZINE HCL 25 MG PO TABS
25.0000 mg | ORAL_TABLET | Freq: Four times a day (QID) | ORAL | Status: DC | PRN
  Administered 2015-01-07: 25 mg via ORAL
  Filled 2015-01-04: qty 1
  Filled 2015-01-04: qty 20

## 2015-01-04 MED ORDER — ASPIRIN EC 81 MG PO TBEC
81.0000 mg | DELAYED_RELEASE_TABLET | Freq: Every day | ORAL | Status: DC
  Administered 2015-01-04 – 2015-01-14 (×11): 81 mg via ORAL
  Filled 2015-01-04 (×14): qty 1

## 2015-01-04 MED ORDER — FUROSEMIDE 40 MG PO TABS
40.0000 mg | ORAL_TABLET | Freq: Two times a day (BID) | ORAL | Status: DC
  Administered 2015-01-04 – 2015-01-14 (×20): 40 mg via ORAL
  Filled 2015-01-04: qty 1
  Filled 2015-01-04: qty 2
  Filled 2015-01-04 (×20): qty 1
  Filled 2015-01-04: qty 2
  Filled 2015-01-04: qty 1

## 2015-01-04 MED ORDER — HYDRALAZINE HCL 50 MG PO TABS
100.0000 mg | ORAL_TABLET | Freq: Three times a day (TID) | ORAL | Status: DC
  Administered 2015-01-04 – 2015-01-14 (×29): 100 mg via ORAL
  Filled 2015-01-04 (×29): qty 2
  Filled 2015-01-04: qty 4
  Filled 2015-01-04 (×4): qty 2

## 2015-01-04 MED ORDER — AMLODIPINE BESYLATE 5 MG PO TABS
5.0000 mg | ORAL_TABLET | Freq: Two times a day (BID) | ORAL | Status: DC
  Administered 2015-01-04 – 2015-01-14 (×20): 5 mg via ORAL
  Filled 2015-01-04 (×24): qty 1

## 2015-01-04 MED ORDER — NICOTINE POLACRILEX 2 MG MT GUM
2.0000 mg | CHEWING_GUM | OROMUCOSAL | Status: DC | PRN
  Administered 2015-01-05 – 2015-01-14 (×37): 2 mg via ORAL
  Filled 2015-01-04 (×23): qty 1

## 2015-01-04 MED ORDER — ACETAMINOPHEN 325 MG PO TABS: 650.0000 mg | ORAL_TABLET | Freq: Four times a day (QID) | ORAL | Status: DC | PRN

## 2015-01-04 MED ORDER — ALUM & MAG HYDROXIDE-SIMETH 200-200-20 MG/5ML PO SUSP: 30.0000 mL | ORAL | Status: DC | PRN

## 2015-01-04 MED ORDER — LINAGLIPTIN 5 MG PO TABS
5.0000 mg | ORAL_TABLET | Freq: Every day | ORAL | Status: DC
  Administered 2015-01-04 – 2015-01-14 (×11): 5 mg via ORAL
  Filled 2015-01-04 (×13): qty 1

## 2015-01-04 MED ORDER — TRAZODONE HCL 100 MG PO TABS
100.0000 mg | ORAL_TABLET | Freq: Every day | ORAL | Status: DC
  Administered 2015-01-04 – 2015-01-05 (×2): 100 mg via ORAL
  Filled 2015-01-04 (×5): qty 1

## 2015-01-04 MED ORDER — POTASSIUM CHLORIDE ER 10 MEQ PO TBCR
10.0000 meq | EXTENDED_RELEASE_TABLET | Freq: Two times a day (BID) | ORAL | Status: DC
  Administered 2015-01-04 – 2015-01-14 (×20): 10 meq via ORAL
  Filled 2015-01-04 (×23): qty 1

## 2015-01-04 MED ORDER — CLONIDINE HCL 0.1 MG PO TABS
0.1000 mg | ORAL_TABLET | Freq: Two times a day (BID) | ORAL | Status: DC
  Administered 2015-01-04 – 2015-01-14 (×20): 0.1 mg via ORAL
  Filled 2015-01-04 (×23): qty 1

## 2015-01-04 NOTE — Tx Team (Addendum)
Initial Interdisciplinary Treatment Plan   PATIENT STRESSORS: Marital or family conflict Medication change or noncompliance   PATIENT STRENGTHS: Ability for insight General fund of knowledge Motivation for treatment/growth   PROBLEM LIST: Problem List/Patient Goals Date to be addressed Date deferred Reason deferred Estimated date of resolution  Bizarre behavior      Psychosis      Agitation      Irritability            "I'm not suicidal, but I need to go to [place].  They can help me there"                         DISCHARGE CRITERIA:  Ability to meet basic life and health needs Improved stabilization in mood, thinking, and/or behavior Motivation to continue treatment in a less acute level of care Need for constant or close observation no longer present Safe-care adequate arrangements made  PRELIMINARY DISCHARGE PLAN: Attend aftercare/continuing care group Outpatient therapy  PATIENT/FAMIILY INVOLVEMENT: This treatment plan has been presented to and reviewed with the patient, Ivan [name].  The patient and family have been given the opportunity to ask questions and make suggestions.  Wynn Banker 01/04/2015, 5:48 PM

## 2015-01-04 NOTE — Progress Notes (Signed)
Pt has spent most of the evening in the dayroom watching TV with minimal interaction with peers.  He was admitted this evening, but has been a pt at [facility] multiple times.  Pt reports he is fine.  He seems at ease and denies SI/HI/AVH at this time.  Pt's BP was elevated on admission and was rechecked before bedtime.  He received his anti-hypertensive medications at bedtime.  Pt makes his needs known to staff.  Support and encouragement offered.  Safety maintained with q15 minute checks.-

## 2015-01-04 NOTE — ED Notes (Signed)
Called [facility] for transport to [facility].

## 2015-01-04 NOTE — Progress Notes (Signed)
Pt was admitted and brought back on the unit by nurse, Seth Bake. Pt appears somewhat agitated. His FSBS was 119- NP made aware by Nurse ,Seth Bake of pts elevated BP. Pt did go to dinner.He does contract for safety and denies Si and HI.

## 2015-01-04 NOTE — Consult Note (Signed)
Telepsych Consultation   Reason for Consult:  Bizarre behavior Referring Physician:  EDP Ivan [name] is an 54 y.o. male.   Diagnosis: Schizoaffective disorder, bipolar type   Patient Active Problem List   Diagnosis Date Noted  . Schizoaffective disorder, bipolar type     Priority: High  . Undifferentiated schizophrenia 11/29/2014  . Corneal abrasion 07/07/2014  . Weakness generalized 07/07/2014  . Left-sided weakness   . Trauma to eye   . Diabetes mellitus due to underlying condition with diabetic nephropathy   . CKD (chronic kidney disease) stage 3, GFR 30-59 ml/min   . Pedal edema   . Chronic pain syndrome   . Essential hypertension   . Edema of both legs 04/07/2013  . Diabetes 04/01/2013  . Essential hypertension, benign 04/01/2013     Subjective:   Ivan [name] is a 54 y.o. male patient admitted with a situation where he was found yelling in his front yard, refusing to cooperate with others. Pt has a history of being inpatient at [facility] and other facilities. Pt refused the assessment with this NP and kept stating "I want to go to [place], I need to go to [place], I need help, just help me". Pt is known to this NP from other assessments and has a history of instability and medication noncompliance. Per this NP and Dr. Parke Poisson, pt is very agitated and not at baseline, warranting inpatient hospitalization for stabilization.   HPI: Ivan [name] is an 54 y.o. male brought to [facility] ED by EMS after he was found yelling in his front yard. He told the ED staff that he did not want to harm himself or others, but hat he was not happy where he was. UPon assessment he presents as somewhat disheveled with fair eye contact and loud speech. His mood was angry and his affect was congruent. He said, "Just send me to [facility]. That's the best place I could go. They have good doctors there." This writer explained that I needed to make a referral to [facility] and thus needed to get the answers to  specific questions. He replied, "that's all i'm going to say. You can call them or give me the number and I'll call them myself. I don't want to hurt any body, but I need to go there." He refused to answer any more questions or say anything more stating he would talk to a lawyer if need be. This Probation officer consulted with Waylan Boga, [place], who reccommended monitoring overnight and reevaluation in the morning. ED staff notified.     Past Psychiatric History: Past Medical History  Diagnosis Date  . Diabetes mellitus   . Hypertension   . Bipolar 1 disorder   . Schizoaffective disorder   . Hypokalemia 04/02/2013  . Edema of both legs 04/07/2013  . Paranoid schizophrenia     reports that he has been smoking Cigarettes and Cigars.  He has a 19.5 pack-year smoking history. He has never used smokeless tobacco. He reports that he does not drink alcohol or use illicit drugs. No family history on file. Family History Substance Abuse:  (UTA) Family Supports:  (UTA) Living Arrangements: Alone Can pt return to current living arrangement?: Yes Allergies:   Allergies  Allergen Reactions  . Bee Venom Swelling    ACT Assessment Complete:  Yes:    Educational Status    Risk to Self: Risk to self with the past 6 months Suicidal Ideation:  (UTA) Has patient been a risk to self within the  past 6 months prior to admission? :  (UTA) Suicidal Intent:  (UTA) Has patient had any suicidal intent within the past 6 months prior to admission? :  (UTA) Is patient at risk for suicide?:  (UTA) Suicidal Plan?:  (UTA) Has patient had any suicidal plan within the past 6 months prior to admission? :  (UTA) Access to Means:  (UTA) What has been your use of drugs/alcohol within the last 12 months?: UTA Previous Attempts/Gestures:  (UTA) Other Self Harm Risks: UTA Triggers for Past Attempts: Unknown Intentional Self Injurious Behavior:  (UTA) Family Suicide History: Unable to assess Recent stressful life  event(s):  (UTA) Persecutory voices/beliefs?:  (UTA) Depression: Yes Depression Symptoms: Feeling worthless/self pity, Feeling angry/irritable Substance abuse history and/or treatment for substance abuse?: No Suicide prevention information given to non-admitted patients: Not applicable  Risk to Others: Risk to Others within the past 6 months Homicidal Ideation: No Does patient have any lifetime risk of violence toward others beyond the six months prior to admission? : No Thoughts of Harm to Others: No Current Homicidal Intent: No Current Homicidal Plan: No Access to Homicidal Means: No History of harm to others?:  (UTA) Assessment of Violence:  (UTA) Violent Behavior Description: UTA Does patient have access to weapons?:  (Branch) Criminal Charges Pending?:  (UTA) Does patient have a court date:  (UTA) Is patient on probation?:  (UTA)  Abuse: Abuse/Neglect Assessment (Assessment to be complete while patient is alone) Physical Abuse:  (UTA) Verbal Abuse:  (UTA) Sexual Abuse:  (UTA)  Prior Inpatient Therapy: Prior Inpatient Therapy Prior Inpatient Therapy: Yes Prior Therapy Dates: 11/2014 Prior Therapy Facilty/Provider(s): [facility] Reason for Treatment: unk  Prior Outpatient Therapy:    Additional Information: Additional Information 1:1 In Past 12 Months?: No CIRT Risk: Yes Elopement Risk: Yes Does patient have medical clearance?: Yes      Objective: Blood pressure 170/104, pulse 63, temperature 97.5 F (36.4 C), temperature source Oral, resp. rate 18, SpO2 100 %.There is no weight on file to calculate BMI. Results for orders placed or performed during the hospital encounter of 01/03/15 (from the past 72 hour(s))  Ethanol     Status: None   Collection Time: 01/03/15  3:35 PM  Result Value Ref Range   Alcohol, Ethyl (B) <5 <5 mg/dL    Comment:        LOWEST DETECTABLE LIMIT FOR SERUM ALCOHOL IS 11 mg/dL FOR MEDICAL PURPOSES ONLY   Acetaminophen level     Status: Abnormal    Collection Time: 01/03/15  3:35 PM  Result Value Ref Range   Acetaminophen (Tylenol), Serum <10 (L) 10 - 30 ug/mL    Comment:        THERAPEUTIC CONCENTRATIONS VARY SIGNIFICANTLY. A RANGE OF 10-30 ug/mL MAY BE AN EFFECTIVE CONCENTRATION FOR MANY PATIENTS. HOWEVER, SOME ARE BEST TREATED AT CONCENTRATIONS OUTSIDE THIS RANGE. ACETAMINOPHEN CONCENTRATIONS >150 ug/mL AT 4 HOURS AFTER INGESTION AND >50 ug/mL AT 12 HOURS AFTER INGESTION ARE OFTEN ASSOCIATED WITH TOXIC REACTIONS.   Salicylate level     Status: None   Collection Time: 01/03/15  3:35 PM  Result Value Ref Range   Salicylate Lvl 123456 2.8 - 30.0 mg/dL  I-stat chem 8, ed     Status: Abnormal   Collection Time: 01/03/15  3:41 PM  Result Value Ref Range   Sodium 144 135 - 145 mmol/L   Potassium 3.6 3.5 - 5.1 mmol/L   Chloride 107 101 - 111 mmol/L   BUN 17 6 - 20 mg/dL  Creatinine, Ser 1.40 (H) 0.61 - 1.24 mg/dL   Glucose, Bld 119 (H) 65 - 99 mg/dL   Calcium, Ion 1.28 (H) 1.12 - 1.23 mmol/L   TCO2 21 0 - 100 mmol/L   Hemoglobin 12.2 (L) 13.0 - 17.0 g/dL   HCT 36.0 (L) 39.0 - 52.0 %  Urine rapid drug screen (hosp performed)     Status: None   Collection Time: 01/03/15  4:52 PM  Result Value Ref Range   Opiates NONE DETECTED NONE DETECTED   Cocaine NONE DETECTED NONE DETECTED   Benzodiazepines NONE DETECTED NONE DETECTED   Amphetamines NONE DETECTED NONE DETECTED   Tetrahydrocannabinol NONE DETECTED NONE DETECTED   Barbiturates NONE DETECTED NONE DETECTED    Comment:        DRUG SCREEN FOR MEDICAL PURPOSES ONLY.  IF CONFIRMATION IS NEEDED FOR ANY PURPOSE, NOTIFY LAB WITHIN 5 DAYS.        LOWEST DETECTABLE LIMITS FOR URINE DRUG SCREEN Drug Class       Cutoff (ng/mL) Amphetamine      1000 Barbiturate      200 Benzodiazepine   [id number] Tricyclics       [id number] Opiates          300 Cocaine          300 THC              50   CBG monitoring, ED     Status: Abnormal   Collection Time: 01/04/15  3:13 PM  Result Value Ref  Range   Glucose-Capillary 134 (H) 65 - 99 mg/dL   LABS: Unremarkable from psychiatry standpoint   Current facility-administered medications:  .  acetaminophen (TYLENOL) tablet 650 mg, 650 mg, Oral, Q4H PRN, Leonard Schwartz, MD .  alum & mag hydroxide-simeth (MAALOX/MYLANTA) 200-200-20 MG/5ML suspension 30 mL, 30 mL, Oral, PRN, Leonard Schwartz, MD .  LORazepam (ATIVAN) tablet 1 mg, 1 mg, Oral, Q8H PRN, Leonard Schwartz, MD .  ondansetron [facility]) tablet 4 mg, 4 mg, Oral, Q8H PRN, Leonard Schwartz, MD .  zolpidem (AMBIEN) tablet 5 mg, 5 mg, Oral, QHS PRN, Leonard Schwartz, MD  Current outpatient prescriptions:  .  allopurinol (ZYLOPRIM) 300 MG tablet, Take 1 tablet (300 mg total) by mouth daily., Disp: 30 tablet, Rfl: 0 .  amLODipine (NORVASC) 5 MG tablet, Take 1 tablet (5 mg total) by mouth 2 (two) times daily., Disp: 60 tablet, Rfl: 0 .  aspirin EC 81 MG EC tablet, Take 1 tablet (81 mg total) by mouth daily., Disp: 30 tablet, Rfl: 0 .  benztropine (COGENTIN) 0.5 MG tablet, Take 1 tablet (0.5 mg total) by mouth daily., Disp: 30 tablet, Rfl: 0 .  cloNIDine (CATAPRES) 0.1 MG tablet, Take 1 tablet (0.1 mg total) by mouth 2 (two) times daily., Disp: 60 tablet, Rfl: 0 .  furosemide (LASIX) 40 MG tablet, Take 1 tablet (40 mg total) by mouth 2 (two) times daily., Disp: 60 tablet, Rfl: 0 .  gabapentin (NEURONTIN) 400 MG capsule, Take 1 capsule (400 mg total) by mouth 3 (three) times daily., Disp: 90 capsule, Rfl: 0 .  haloperidol (HALDOL) 5 MG tablet, Take 1 tablet (5 mg total) by mouth daily., Disp: 30 tablet, Rfl: 0 .  hydrALAZINE (APRESOLINE) 100 MG tablet, Take 1 tablet (100 mg total) by mouth 3 (three) times daily., Disp: 90 tablet, Rfl: 0 .  hydrOXYzine (ATARAX/VISTARIL) 25 MG tablet, Take 1 tablet (25 mg total) by mouth every 6 (six) hours as needed for anxiety (sleep)., Disp:  30 tablet, Rfl: 0 .  labetalol (NORMODYNE) 200 MG tablet, Take 1 tablet (200 mg total) by mouth 2 (two) times daily., Disp: 60  tablet, Rfl: 0 .  lamoTRIgine (LAMICTAL) 100 MG tablet, Take 1 tablet (100 mg total) by mouth daily., Disp: 30 tablet, Rfl: 0 .  potassium chloride (K-DUR) 10 MEQ tablet, Take 1 tablet (10 mEq total) by mouth 2 (two) times daily., Disp: 60 tablet, Rfl: 0 .  sitaGLIPtin (JANUVIA) 100 MG tablet, Take 1 tablet (100 mg total) by mouth daily., Disp: , Rfl:  .  traZODone (DESYREL) 100 MG tablet, Take 1 tablet (100 mg total) by mouth at bedtime., Disp: 30 tablet, Rfl: 0   Psychiatric Specialty Exam:  Patient refuses to cooperate     Blood pressure 170/104, pulse 63, temperature 97.5 F (36.4 C), temperature source Oral, resp. rate 18, SpO2 100 %.There is no weight on file to calculate BMI.  General Appearance: Casual, Fairly Groomed  Eye Contact::  none  Speech:  clear  Volume: increased  Mood:  Irritable, angry  Affect:  Angry, labile  Thought Process:  Agitation and rumination about wanting to go inpatient to [place]  Orientation:      Self  Thought Content: Rumination    Suicidal Thoughts:  Denies  Homicidal Thoughts:  Denies  Memory:  Fair  Judgement:  Lacking/impaired  Insight:  lacking  Psychomotor Activity:   Agitated  Concentration: Poor  Recall:   Poor  Akathisia:    No  Handed:    AIMS (if indicated):     Assets:  Resliience, Social/family support  Sleep:      Treatment Plan Summary: Schizoaffective disorder, bipolar type unstable at this time -Inpatient psychiatric hospitalization for safety and stabilization -Contact sister on chart as she is legal guardian for [employer]  Benjamine Mola, FNP-BC 11:11 AM5/17/2016   Patient case discussed with me as above  Neita Garnet, MD

## 2015-01-04 NOTE — ED Notes (Signed)
Patient resting quietly in bed with eyes closed.  Sitter remains with patient.
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
## 2015-01-04 NOTE — Progress Notes (Signed)
Patient ID: Ivan [name], male   DOB: 12/14/1960, 54 y.o.   MRN: [id number]  Pt admitted IVC from [facility], pt did not want to discuss what brought him here to the hospital and was otherwise cooperative throughout the admission process, denies SI/HI/AVH, skin/contraband search done, skin intact, no contraband found, pt does have LLE edema he states is from an old injury to that leg, pt is diabetic--cbg on admit 119, pt is also hypertensive--NP notified, pt went to dinner and has no questions or concerns at this time, oriented to unit and rules.

## 2015-01-04 NOTE — Progress Notes (Signed)
Per Lyda Jester, pt accepted to [facility] bed 502-2 by Dr. Shea Evans. Can be transported after 15:00.   Spoke with [facility] charge RN Bethena Roys regarding pt's placement.  Sharren Bridge, MSW, [place] Clinical Social Work, Disposition  01/04/2015 [phone]

## 2015-01-04 NOTE — Progress Notes (Signed)
Attempted contacting pt's family to obtain collateral information re: events leading to ED admission.  Ivan [id number] [phone]- no answer, voicemail box full Ginger Michele- [phone], no answer, left voicemail to return call at [phone] (disposition SW phone) Number listed as pt's home phone- [phone]- no answer, voicemail box full  Sharren Bridge, MSW, [place] Work, Disposition  01/04/2015 [phone]

## 2015-01-04 NOTE — ED Notes (Signed)
Pt left with [facility] and Seth Bake with [facility] called to notify of pt leaving

## 2015-01-05 ENCOUNTER — Encounter (HOSPITAL_COMMUNITY): Payer: Self-pay | Admitting: Psychiatry

## 2015-01-05 DIAGNOSIS — F200 Paranoid schizophrenia: Principal | ICD-10-CM

## 2015-01-05 LAB — GLUCOSE, CAPILLARY
GLUCOSE-CAPILLARY: 112 mg/dL — AB (ref 65–99)
Glucose-Capillary: 91 mg/dL (ref 65–99)
Glucose-Capillary: 126 mg/dL — ABNORMAL HIGH (ref 65–99)

## 2015-01-05 MED ORDER — BENZTROPINE MESYLATE 0.5 MG PO TABS
0.5000 mg | ORAL_TABLET | Freq: Every evening | ORAL | Status: DC
  Administered 2015-01-05 – 2015-01-13 (×9): 0.5 mg via ORAL
  Filled 2015-01-05 (×10): qty 1

## 2015-01-05 MED ORDER — HALOPERIDOL 5 MG PO TABS
5.0000 mg | ORAL_TABLET | Freq: Every evening | ORAL | Status: DC
  Administered 2015-01-05 – 2015-01-13 (×9): 5 mg via ORAL
  Filled 2015-01-05 (×10): qty 1

## 2015-01-05 MED ORDER — INSULIN ASPART 100 UNIT/ML ~~LOC~~ SOLN
0.0000 [IU] | Freq: Three times a day (TID) | SUBCUTANEOUS | Status: DC
Start: 2015-01-05 — End: 2015-01-14
  Administered 2015-01-06 – 2015-01-08 (×4): 2 [IU] via SUBCUTANEOUS
  Administered 2015-01-09: 3 [IU] via SUBCUTANEOUS
  Administered 2015-01-10 – 2015-01-12 (×5): 2 [IU] via SUBCUTANEOUS
  Administered 2015-01-13: 3 [IU] via SUBCUTANEOUS

## 2015-01-05 MED ORDER — OXCARBAZEPINE 150 MG PO TABS
150.0000 mg | ORAL_TABLET | Freq: Two times a day (BID) | ORAL | Status: DC
  Administered 2015-01-05 – 2015-01-14 (×18): 150 mg via ORAL
  Filled 2015-01-05 (×20): qty 1

## 2015-01-05 NOTE — Progress Notes (Signed)
Adult Psychoeducational Group Note  Date:  01/05/2015 Time:  9:05 PM  Group Topic/Focus:  Wrap-Up Group:   The focus of this group is to help patients review their daily goal of treatment and discuss progress on daily workbooks.  Participation Level:  Active  Participation Quality:  Appropriate  Affect:  Appropriate  Cognitive:  Appropriate  Insight: Appropriate  Engagement in Group:  Engaged  Modes of Intervention:  Discussion  Additional Comments: The  Patient expressed that he attended the groups today.The patient also said that he was going to do better with his situation.  Nash Shearer 01/05/2015, 9:05 PM
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
## 2015-01-05 NOTE — BHH Group Notes (Signed)
[facility] Group Therapy  01/05/2015 , 1:23 PM    Type of Therapy:  [facility] Presentation  Participation Level:  Invited.  Chose to not attend  Summary of Progress/Problems:  [name] from [place] came to present his recovery story and play the guitar.    [name] 01/05/2015 , 1:23 PM

## 2015-01-05 NOTE — H&P (Signed)
Psychiatric Admission Assessment Adult  Patient Identification:  Ivan [name] Date of Evaluation:  01/05/2015 Chief Complaint: Pt states ,'I do not want to say nothing more.'   Principal Problem: Schizophrenia Diagnosis:  Patient Active Problem List   Diagnosis Date Noted  . Schizophrenia [F20.9] 01/04/2015  . CKD (chronic kidney disease) stage 3, GFR 30-59 ml/min [N18.3]   . Pedal edema [R60.0]   . Chronic pain syndrome [G89.4]   . Essential hypertension [I10]   . Edema of both legs [R60.0] 04/07/2013  . Type I diabetes mellitus with complication, uncontrolled [E10.69] 04/01/2013               History of Present Illness::Ivan [name] is a 54y.o. AA male with a history of Diabetes Mellitus,Hypertension ,Chronic back pain ,Pedal edema ,Hx of Elevated LFTs due to Depakote ,CKD Stage [name] as well as schizophrenia , was brought to [facility] ED by EMS after he was found yelling in his front yard.Per initial notes in EHR " He told the ED staff that he did not want to harm himself or others, but hat he was not happy where he was. UPon assessment he presents as somewhat disheveled with fair eye contact and loud speech. His mood was angry and his affect was congruent. He said, "Just send me to [facility]. That's the best place I could go. They have good doctors there."   Patient is well known to Probation officer as well as to [facility]. Patient was recently discharged from the unit in April 2016. Pt today is uncooperative with evaluation. Pt appeared to be calm , no aggression noted. However when patient was asked questions regarding his current admission - patient stated " I do not want to answer anything. I am not my guardian " and he stormed out of the room.  Hence majority of the information was obtained from EHR.  Pt is usually compliant on medications while at the hospital  , but on discharge stops taking his medications and decompensates. Pt has multiple medical  issues including cardiac hx, hence not a good candidate for [facility].  Patient's sister  information was obtained from Sister - Dermott Sartwell is his legal guardian.   Elements:  Location:  mood lability,psychosis. Quality:delusional ,guarded. Severity:  severe. Timing:  constant. Duration:  past 1 week. Context:  schizophrenia. Associated Signs/Synptoms: Depression Symptoms:  pt not cooperative (Hypo) Manic Symptoms:  Delusions, Impulsivity, Labiality of Mood, Anxiety Symptoms:  Pt not cooperative Psychotic Symptoms:  Delusions, Paranoia,pt is not cooperative PTSD Symptoms: Negative Total Time spent with patient: 45 minutes  Psychiatric Specialty Exam: Physical Exam  Patient is not cooperative  Review of Systems  Unable to perform ROS: mental acuity    Blood pressure 123/78, pulse 89, temperature 97.4 F (36.3 C), temperature source Oral, resp. rate 19, height 6' 2.5" (1.892 m), weight 117.482 kg (259 lb).Body mass index is 32.82 kg/(m^2).  General Appearance: Fairly Groomed  Engineer, water::  Fair  Speech:  Clear and Coherent  Volume:  Normal  Mood:  Anxious and Irritable about his situation  Affect:  Labile  Thought Process:  Linear  Orientation:  Full (Time, Place, and Person)  Thought Content:  Paranoid Ideation  Suicidal Thoughts:  No  Homicidal Thoughts:  No  Memory:  Immediate;   unable to assess Recent;   unable to assess Remote;   unable to assess  Judgement:  Impaired  Insight:  Lacking  Psychomotor Activity:  Normal  Concentration:  Fair  Recall:  Fair  Fund of Knowledge:Good  Language: Good  Akathisia:  No  Handed:  Right  AIMS (if indicated):     Assets:  Social Support Others:  access to healthcare  Sleep:       Musculoskeletal: Strength & Muscle Tone: within normal limits Gait & Station: normal Patient leans: N/A   Past Psychiatric History: Diagnosis:schizophrenia  Hospitalizations:several,last one in [facility], April 2016  Outpatient  Care:[facility]  Substance Abuse Care:Denies  Self-Mutilation: per review of EHR -denied in the past  Suicidal Attempts:unknown, patient uncooperative  Violent Behaviors:patient uncooperative    Past Medical History:   Past Medical History  Diagnosis Date  . Diabetes mellitus   . Hypertension   . Bipolar 1 disorder   . Schizoaffective disorder   . Hypokalemia 04/02/2013  . Edema of both legs 04/07/2013  . Paranoid schizophrenia    None. Allergies:   Allergies  Allergen Reactions  . Bee Venom Swelling   PTA Medications: Prescriptions prior to admission  Medication Sig Dispense Refill Last Dose  . allopurinol (ZYLOPRIM) 300 MG tablet Take 1 tablet (300 mg total) by mouth daily. 30 tablet 0 Past Week at Unknown time  . amLODipine (NORVASC) 5 MG tablet Take 1 tablet (5 mg total) by mouth 2 (two) times daily. 60 tablet 0 Past Week at Unknown time  . aspirin EC 81 MG EC tablet Take 1 tablet (81 mg total) by mouth daily. 30 tablet 0 Past Week at Unknown time  . benztropine (COGENTIN) 0.5 MG tablet Take 1 tablet (0.5 mg total) by mouth daily. 30 tablet 0 Past Week at Unknown time  . cloNIDine (CATAPRES) 0.1 MG tablet Take 1 tablet (0.1 mg total) by mouth 2 (two) times daily. 60 tablet 0 Past Week at Unknown time  . furosemide (LASIX) 40 MG tablet Take 1 tablet (40 mg total) by mouth 2 (two) times daily. 60 tablet 0 Past Week at Unknown time  . gabapentin (NEURONTIN) 400 MG capsule Take 1 capsule (400 mg total) by mouth 3 (three) times daily. 90 capsule 0 Past Week at Unknown time  . haloperidol (HALDOL) 5 MG tablet Take 1 tablet (5 mg total) by mouth daily. 30 tablet 0 Past Week at Unknown time  . hydrALAZINE (APRESOLINE) 100 MG tablet Take 1 tablet (100 mg total) by mouth 3 (three) times daily. 90 tablet 0 Past Week at Unknown time  . hydrOXYzine (ATARAX/VISTARIL) 25 MG tablet Take 1 tablet (25 mg total) by mouth every 6 (six) hours as needed for anxiety (sleep). 30 tablet 0 Past Week at  Unknown time  . labetalol (NORMODYNE) 200 MG tablet Take 1 tablet (200 mg total) by mouth 2 (two) times daily. 60 tablet 0 Past Week at Unknown time  . lamoTRIgine (LAMICTAL) 100 MG tablet Take 1 tablet (100 mg total) by mouth daily. 30 tablet 0 Past Week at Unknown time  . potassium chloride (K-DUR) 10 MEQ tablet Take 1 tablet (10 mEq total) by mouth 2 (two) times daily. 60 tablet 0 Past Week at Unknown time  . sitaGLIPtin (JANUVIA) 100 MG tablet Take 1 tablet (100 mg total) by mouth daily.   Past Week at Unknown time  . traZODone (DESYREL) 100 MG tablet Take 1 tablet (100 mg total) by mouth at bedtime. 30 tablet 0 Past Week at Unknown time    Previous Psychotropic Medications:  Medication/Dose  See mar  Haldol             Substance Abuse History in the last 12 months:  Yes.    Consequences of Substance Abuse: NA  Social History:  reports that he has been smoking Cigarettes and Cigars.  He has a 19.5 pack-year smoking history. He has never used smokeless tobacco. He reports that he does not drink alcohol or use illicit drugs. Additional Social History:                      Current Place of Residence:[place] ,[place] Marital Status: Divorced Relationships:relational struggles Education: unknown Glass blower/designer History: None. Legal History:unknown Hobbies/Interests:none  Family History: Patient denies family hx of mental illness or medical issues  Results for orders placed or performed during the hospital encounter of 01/04/15 (from the past 72 hour(s))  Glucose, capillary     Status: Abnormal   Collection Time: 01/04/15  5:23 PM  Result Value Ref Range   Glucose-Capillary 119 (H) 65 - 99 mg/dL   Comment 1 Notify RN    Comment 2 Document in Chart   Glucose, capillary     Status: Abnormal   Collection Time: 01/05/15  5:59 AM  Result Value Ref Range   Glucose-Capillary 112 (H) 65 - 99 mg/dL   Comment 1 Notify RN    Psychological  Evaluations:denies    Past Medical History  Diagnosis Date  . Diabetes mellitus   . Hypertension   . Bipolar 1 disorder   . Schizoaffective disorder   . Hypokalemia 04/02/2013  . Edema of both legs 04/07/2013  . Paranoid schizophrenia     Current Medications:  Current Facility-Administered Medications  Medication Dose Route Frequency Provider Last Rate Last Dose  . acetaminophen (TYLENOL) tablet 650 mg  650 mg Oral Q6H PRN Niel Hummer, NP      . alum & mag hydroxide-simeth (MAALOX/MYLANTA) 200-200-20 MG/5ML suspension 30 mL  30 mL Oral Q4H PRN Niel Hummer, NP      . amLODipine (NORVASC) tablet 5 mg  5 mg Oral BID Niel Hummer, NP   5 mg at 01/05/15 0825  . aspirin EC tablet 81 mg  81 mg Oral Daily Niel Hummer, NP   81 mg at 01/05/15 0825  . benztropine (COGENTIN) tablet 0.5 mg  0.5 mg Oral QPM Cire Clute, MD      . cloNIDine (CATAPRES) tablet 0.1 mg  0.1 mg Oral BID Niel Hummer, NP   0.1 mg at 01/05/15 0825  . furosemide (LASIX) tablet 40 mg  40 mg Oral BID Niel Hummer, NP   40 mg at 01/05/15 0825  . haloperidol (HALDOL) tablet 5 mg  5 mg Oral QPM Jung Ingerson, MD      . hydrALAZINE (APRESOLINE) tablet 100 mg  100 mg Oral TID Niel Hummer, NP   100 mg at 01/05/15 1207  . hydrOXYzine (ATARAX/VISTARIL) tablet 25 mg  25 mg Oral Q6H PRN Niel Hummer, NP      . linagliptin (TRADJENTA) tablet 5 mg  5 mg Oral Daily Niel Hummer, NP   5 mg at 01/05/15 0825  . magnesium hydroxide (MILK OF MAGNESIA) suspension 30 mL  30 mL Oral Daily PRN Niel Hummer, NP      . nicotine polacrilex (NICORETTE) gum 2 mg  2 mg Oral PRN Laverle Hobby, PA-C   2 mg at 01/05/15 1002  . OXcarbazepine (TRILEPTAL) tablet 150 mg  150 mg Oral BID Gina Costilla, MD      . potassium chloride (K-DUR) CR tablet 10 mEq  10 mEq Oral  BID Niel Hummer, NP   10 mEq at 01/05/15 0825  . traZODone (DESYREL) tablet 100 mg  100 mg Oral QHS Niel Hummer, NP   100 mg at 01/04/15 2125    Assessment:  Patient is  a 54 year old AAM with multiple medical problems who presented IVC ed for agitation . Sister ,Aundra Carlton is patient's legal guardian. Pt will benefit from inpatient stay.  Treatment Plan Summary: Daily contact with patient to assess and evaluate symptoms and progress in treatment Medication management  Recommendations:  Patient will benefit from inpatient treatment and stabilization.  Estimated length of stay is 5-7 days.  Reviewed past medical records,treatment plan.   Will restart his medication where needed. Will restart Haldol 5 mg po daily for psychosis, mood sx. Will add Cogentin 0.5 mg po daily for EPS. Will start a trial of Trileptal 150 mg po bid for mood sx. Will continue Trazodone 100 mg po qhs for sleep.   Will continue to monitor vitals ,medication compliance and treatment side effects while patient is here.   CSW will start working on disposition.CSW will obtain collateral information from sister .       Observation Level/Precautions:  Fall 15 minute checks  Laboratory:  Reviewed EKG from 4/16. Will get lipid panel.  Psychotherapy:  GROUP AND INDIVIDUAL THERAPY  Medications:  SEE ABOVE  Consultations:Social worker   Discharge Concerns:  Stability and safety       I certify that inpatient services furnished can reasonably be expected to improve the patient's condition.   Amelie Caracci MD 5/18/20161:37 PM

## 2015-01-05 NOTE — BHH Suicide Risk Assessment (Signed)
[facility] Admission Suicide Risk Assessment   Nursing information obtained from:    Demographic factors:    Current Mental Status:    Loss Factors:    Historical Factors:    Risk Reduction Factors:    Total Time spent with patient: 30 minutes Principal Problem: Schizophrenia Diagnosis:   Patient Active Problem List   Diagnosis Date Noted  . Schizophrenia [F20.9] 01/04/2015  . CKD (chronic kidney disease) stage 3, GFR 30-59 ml/min [N18.3]   . Pedal edema [R60.0]   . Chronic pain syndrome [G89.4]   . Essential hypertension [I10]   . Edema of both legs [R60.0] 04/07/2013  . Type I diabetes mellitus with complication, uncontrolled [E10.69] 04/01/2013     Continued Clinical Symptoms:  Alcohol Use Disorder Identification Test Final Score (AUDIT): 0 The "Alcohol Use Disorders Identification Test", Guidelines for Use in Primary Care, Second Edition.  World Pharmacologist [facility]). Score between 0-7:  no or low risk or alcohol related problems. Score between 8-15:  moderate risk of alcohol related problems. Score between 16-19:  high risk of alcohol related problems. Score 20 or above:  warrants further diagnostic evaluation for alcohol dependence and treatment.   CLINICAL FACTORS:   Previous Psychiatric Diagnoses and Treatments Medical Diagnoses and Treatments/Surgeries   Musculoskeletal: Strength & Muscle Tone: within normal limits Gait & Station: normal Patient leans: N/A  Psychiatric Specialty Exam: Physical Exam  Review of Systems  Unable to perform ROS: mental acuity    Blood pressure 123/78, pulse 89, temperature 97.4 F (36.3 C), temperature source Oral, resp. rate 19, height 6' 2.5" (1.892 m), weight 117.482 kg (259 lb).Body mass index is 32.82 kg/(m^2).  Please see H&P.                                                        COGNITIVE FEATURES THAT CONTRIBUTE TO RISK:  Closed-mindedness, Polarized thinking and Thought constriction (tunnel  vision)    SUICIDE RISK:   Mild:  Suicidal ideation of limited frequency, intensity, duration, and specificity.  There are no identifiable plans, no associated intent, mild dysphoria and related symptoms, good self-control (both objective and subjective assessment), few other risk factors, and identifiable protective factors, including available and accessible social support.  PLAN OF CARE: Please see H&P.   Medical Decision Making:  Review of Psycho-Social Stressors (1), Established Problem, Worsening (2), New Problem, with no additional work-up planned (3), Review of Last Therapy Session (1), Review of Medication Regimen & Side Effects (2) and Review of New Medication or Change in Dosage (2)  I certify that inpatient services furnished can reasonably be expected to improve the patient's condition.   Jericka Kadar MD 01/05/2015, 1:37 PM

## 2015-01-05 NOTE — Progress Notes (Signed)
D: Patient denies SI/HI and A/V hallucinations  A: Monitored q 15 minutes; patient encouraged to attend groups; patient educated about medications; patient given medications per physician orders; patient encouraged to express feelings and/or concerns  R: Patient is flat and blunted; patient forwards little information and only providing one worded answers at most instances; patient is cooperative; patient is taking medications as tolerated

## 2015-01-05 NOTE — Tx Team (Signed)
Interdisciplinary Treatment Plan Update (Adult)  Date:  01/05/2015   Time Reviewed:  8:09 AM   Progress in Treatment: Attending groups: Yes. Participating in groups:  Yes. Taking medication as prescribed:  Yes. Tolerating medication:  Yes. Family/Significant othe contact made:  CSW spoke w guardian, Radarius Schlicher Patient understands diagnosis:  No, patient has limited insight into illness Discussing patient identified problems/goals with staff:  Yes, see initial care plan. Medical problems stabilized or resolved:  Yes. Denies suicidal/homicidal ideation: Yes. Issues/concerns per patient self-inventory:  No. Other:  New problem(s) identified:  Per sister, patient cannot return to current home.  Feels he needs more services to support in community.  Has resisted care in community  Discharge Plan or Barriers:  Outpatient lack of adherence to treatment, significant physical health issues exacerbated by mental illness and resultant isolation.  Reason for Continuation of Hospitalization: Medication stabilization  Comments: Ivan [name] is an 54 y.o. male brought to [facility] ED by EMS after he was found yelling in his front yard. He told the ED staff that he did not want to harm himself or others, but hat he was not happy where he was. UPon assessment he presents as somewhat disheveled with fair eye contact and loud speech. His mood was angry and his affect was congruent. He said, "Just send me to [facility]. That's the best place I could go. They have good doctors there."   Estimated length of stay:  5 - 7 days  New goal(s):  Medication stabilization, development of plan of care for outpatient services, referral for possible out of home placement.    Review of initial/current patient goals per problem list:   See initial plan of care  Attendees: Patient:  01/05/2015 8:09 AM   Family:   01/05/2015 8:09 AM   Physician:  Ursula Alert, MD 01/05/2015 8:09 AM   Nursing:   Gaylan Gerold,  RN 01/05/2015 8:09 AM   CSW:    [name], LCSW   01/05/2015 8:09 AM   Other:  Edwyna Shell, LCSW 01/05/2015 8:09 AM   Other:   01/05/2015 8:09 AM   Other:  Lars Pinks, Nurse CM 01/05/2015 8:09 AM   Other:  Lucinda Dell, [facility] 01/05/2015 8:09 AM   Other:  Norberto Sorenson, [place]  01/05/2015 8:09 AM   Other:  01/05/2015 8:09 AM   Other:  01/05/2015 8:09 AM   Other:  01/05/2015 8:09 AM   Other:  01/05/2015 8:09 AM   Other:  01/05/2015 8:09 AM   Other:   01/05/2015 8:09 AM    Scribe for Treatment Team:   Trish Mage, 01/05/2015 8:09 AM

## 2015-01-06 LAB — LIPID PANEL
CHOL/HDL RATIO: 4.6 ratio
CHOLESTEROL: 137 mg/dL (ref 0–200)
HDL: 30 mg/dL — ABNORMAL LOW (ref >40)
LDL CALC: 84 mg/dL (ref 0–99)
Triglycerides: 113 mg/dL (ref <150)
VLDL: 23 mg/dL (ref 0–40)

## 2015-01-06 LAB — GLUCOSE, CAPILLARY
Glucose-Capillary: 97 mg/dL (ref 65–99)
Glucose-Capillary: 111 mg/dL — ABNORMAL HIGH (ref 65–99)
Glucose-Capillary: 124 mg/dL — ABNORMAL HIGH (ref 65–99)
Glucose-Capillary: 115 mg/dL — ABNORMAL HIGH (ref 65–99)

## 2015-01-06 LAB — TSH: TSH: 0.619 u[IU]/mL (ref 0.350–4.500)

## 2015-01-06 MED ORDER — TRAZODONE HCL 100 MG PO TABS
125.0000 mg | ORAL_TABLET | Freq: Every day | ORAL | Status: DC
  Administered 2015-01-06 – 2015-01-13 (×8): 125 mg via ORAL
  Filled 2015-01-06 (×9): qty 1

## 2015-01-06 NOTE — Progress Notes (Signed)
D: Pt is blunted in affect and depressed in mood. Pt is visible within the milieu but with minimal interactions with others. Pt is currently denying any SI/HI/AVH. Pt forwards a little in interaction with Probation officer.  A: Writer administered scheduled and prn medications to pt, per MD orders. Continued support and availability as needed was extended to this pt. Staff continue to monitor pt with q31min checks.  R: No adverse drug reactions noted. Pt receptive to treatment. Pt remains safe at this time.

## 2015-01-06 NOTE — Progress Notes (Signed)
[facility] MD Progress Note  01/06/2015 1:20 PM Ivan [name]  MRN:  [id number] Subjective: Patient states " I am all right ".  Objective: Patient seen and chart reviewed.Discussed patient with treatment team.Ivan [name] is a 54y.o. AA male with a history of Diabetes Mellitus,Hypertension ,Chronic back pain ,Pedal edema ,Hx of Elevated LFTs due to Depakote ,CKD Stage [name] as well as schizophrenia ,  [facility] after he was found yelling in his front yard.  Patient this AM , seen in bed. Patient continues to be not cooperative , irritable , speech is minimal , does not answer questions during evaluation, but kept repeating " I am all right " and then stopped talking. Patient per nursing has been calm , has been compliant on his medications. He did not express any side effects . Pt did not express any SI /HI/AH/VH , but appears paranoid , irritable .  Pt has been in and out of [facility] several times, his sister is his legal guardian. Pt has not been compliant with his medications prior to this admission , which could have resulted in his recent decompensation. CSW will work on referral to ALF/SNF due to patient's multiple psychiatric as well as medical problems.         Principal Problem: Schizophrenia, multiple episodes , currently in acute episode Diagnosis:   Patient Active Problem List   Diagnosis Date Noted  . Schizophrenia [F20.9] 01/04/2015  . CKD (chronic kidney disease) stage 3, GFR 30-59 ml/min [N18.3]   . Pedal edema [R60.0]   . Chronic pain syndrome [G89.4]   . Essential hypertension [I10]   . Edema of both legs [R60.0] 04/07/2013  . Type I diabetes mellitus with complication, uncontrolled [E10.69] 04/01/2013   Total Time spent with patient: 30 minutes   Past Medical History:  Past Medical History  Diagnosis Date  . Diabetes mellitus   . Hypertension   . Bipolar 1 disorder   . Schizoaffective disorder   . Hypokalemia 04/02/2013  . Edema of  both legs 04/07/2013  . Paranoid schizophrenia     Past Surgical History  Procedure Laterality Date  . Breast surgery    . Fracture surgery      ribs and arm   Family History:  Family History  Problem Relation Age of Onset  . Depression Neg Hx    Social History:  History  Alcohol Use No     History  Drug Use No    History   Social History  . Marital Status: Divorced    Spouse Name: N/A  . Number of Children: N/A  . Years of Education: N/A   Social History Main Topics  . Smoking status: Current Every Day Smoker -- 0.50 packs/day for 39 years    Types: Cigarettes, Cigars  . Smokeless tobacco: Never Used  . Alcohol Use: No  . Drug Use: No  . Sexual Activity: No   Other Topics Concern  . None   Social History Narrative   Additional History:    Sleep: PT REFUSES TO TALK  Appetite:  Pt refuses to express     Musculoskeletal: Strength & Muscle Tone: within normal limits Gait & Station: normal Patient leans: N/A   Psychiatric Specialty Exam: Physical Exam  Review of Systems  Unable to perform ROS: mental acuity    Blood pressure 131/85, pulse 91, temperature 98.1 F (36.7 C), temperature source Oral, resp. rate 20, height 6' 2.5" (1.892 m), weight 117.482 kg (259  lb).Body mass index is 32.82 kg/(m^2).  General Appearance: Fairly Groomed  Engineer, water::  Poor  Speech:  minimal  Volume:  Normal  Mood:  Irritable  Affect:  Congruent  Thought Process:  Intact  Orientation:  Other:  is oriented to person, self and place  Thought Content:  suspicious - did not express any AH/VH  Suicidal Thoughts:  did not express any SI  Homicidal Thoughts:  did not express any HI  Memory:  Unable to assess , pt not cooperative  Judgement:  Impaired  Insight:  Shallow  Psychomotor Activity:  Decreased  Concentration:  Poor  Recall:  Poor  Fund of Knowledge:Fair  Language: Fair  Akathisia:  No  Handed:  Right  AIMS (if indicated):     Assets:  Social Support   ADL's:  Intact  Cognition: WNL  Sleep:  Number of Hours: 2.25     Current Medications: Current Facility-Administered Medications  Medication Dose Route Frequency Provider Last Rate Last Dose  . acetaminophen (TYLENOL) tablet 650 mg  650 mg Oral Q6H PRN Niel Hummer, NP      . alum & mag hydroxide-simeth (MAALOX/MYLANTA) 200-200-20 MG/5ML suspension 30 mL  30 mL Oral Q4H PRN Niel Hummer, NP      . amLODipine (NORVASC) tablet 5 mg  5 mg Oral BID Niel Hummer, NP   5 mg at 01/06/15 0851  . aspirin EC tablet 81 mg  81 mg Oral Daily Niel Hummer, NP   81 mg at 01/06/15 0853  . benztropine (COGENTIN) tablet 0.5 mg  0.5 mg Oral QPM Caymen Dubray, MD   0.5 mg at 01/05/15 1809  . cloNIDine (CATAPRES) tablet 0.1 mg  0.1 mg Oral BID Niel Hummer, NP   0.1 mg at 01/06/15 0851  . furosemide (LASIX) tablet 40 mg  40 mg Oral BID Niel Hummer, NP   40 mg at 01/06/15 [id number]  . haloperidol (HALDOL) tablet 5 mg  5 mg Oral QPM Ursula Alert, MD   5 mg at 01/05/15 1809  . hydrALAZINE (APRESOLINE) tablet 100 mg  100 mg Oral TID Niel Hummer, NP   100 mg at 01/06/15 1208  . hydrOXYzine (ATARAX/VISTARIL) tablet 25 mg  25 mg Oral Q6H PRN Niel Hummer, NP      . insulin aspart (novoLOG) injection 0-15 Units  0-15 Units Subcutaneous TID WC Ursula Alert, MD   0 Units at 01/05/15 1655  . linagliptin (TRADJENTA) tablet 5 mg  5 mg Oral Daily Niel Hummer, NP   5 mg at 01/06/15 0851  . magnesium hydroxide (MILK OF MAGNESIA) suspension 30 mL  30 mL Oral Daily PRN Niel Hummer, NP      . nicotine polacrilex (NICORETTE) gum 2 mg  2 mg Oral PRN Laverle Hobby, PA-C   2 mg at 01/06/15 1105  . OXcarbazepine (TRILEPTAL) tablet 150 mg  150 mg Oral BID Ursula Alert, MD   150 mg at 01/06/15 0852  . potassium chloride (K-DUR) CR tablet 10 mEq  10 mEq Oral BID Niel Hummer, NP   10 mEq at 01/06/15 0852  . traZODone (DESYREL) tablet 125 mg  125 mg Oral QHS Ursula Alert, MD        Lab Results:  Results for  orders placed or performed during the hospital encounter of 01/04/15 (from the past 48 hour(s))  Glucose, capillary     Status: Abnormal   Collection Time: 01/04/15  5:23 PM  Result Value Ref Range   Glucose-Capillary 119 (H) 65 - 99 mg/dL   Comment 1 Notify RN    Comment 2 Document in Chart   Glucose, capillary     Status: Abnormal   Collection Time: 01/05/15  5:59 AM  Result Value Ref Range   Glucose-Capillary 112 (H) 65 - 99 mg/dL   Comment 1 Notify RN   Glucose, capillary     Status: None   Collection Time: 01/05/15  4:50 PM  Result Value Ref Range   Glucose-Capillary 91 65 - 99 mg/dL   Comment 1 Notify RN    Comment 2 Document in Chart   Glucose, capillary     Status: Abnormal   Collection Time: 01/05/15  8:31 PM  Result Value Ref Range   Glucose-Capillary 126 (H) 65 - 99 mg/dL  Lipid panel     Status: Abnormal   Collection Time: 01/06/15  6:20 AM  Result Value Ref Range   Cholesterol 137 0 - 200 mg/dL   Triglycerides 113 <150 mg/dL   HDL 30 (L) >40 mg/dL   Total CHOL/HDL Ratio 4.6 RATIO   VLDL 23 0 - 40 mg/dL   LDL Cholesterol 84 0 - 99 mg/dL    Comment:        Total Cholesterol/HDL:CHD Risk Coronary Heart Disease Risk Table                     Men   Women  1/2 Average Risk   3.4   3.3  Average Risk       5.0   4.4  2 X Average Risk   9.6   7.1  3 X Average Risk  23.4   11.0        Use the calculated Patient Ratio above and the CHD Risk Table to determine the patient's CHD Risk.        ATP [name] CLASSIFICATION (LDL):  <100     mg/dL   Optimal  100-129  mg/dL   Near or Above                    Optimal  130-159  mg/dL   Borderline  160-189  mg/dL   High  >190     mg/dL   Very High Performed at [facility]   TSH     Status: None   Collection Time: 01/06/15  6:20 AM  Result Value Ref Range   TSH 0.619 0.350 - 4.500 uIU/mL    Comment: Performed at [facility]  Glucose, capillary     Status: None   Collection Time: 01/06/15  6:30 AM   Result Value Ref Range   Glucose-Capillary 97 65 - 99 mg/dL  Glucose, capillary     Status: Abnormal   Collection Time: 01/06/15 12:05 PM  Result Value Ref Range   Glucose-Capillary 111 (H) 65 - 99 mg/dL   Comment 1 Notify RN    Comment 2 Document in Chart     Physical Findings: AIMS: Facial and Oral Movements Muscles of Facial Expression: None, normal Lips and Perioral Area: None, normal Jaw: None, normal Tongue: None, normal,Extremity Movements Upper (arms, wrists, hands, fingers): None, normal Lower (legs, knees, ankles, toes): None, normal, Trunk Movements Neck, shoulders, hips: None, normal, Overall Severity Severity of abnormal movements (highest score from questions above): None, normal Incapacitation due to abnormal movements: None, normal Patient's awareness of abnormal movements (rate only patient's report): No Awareness, Dental Status Current  problems with teeth and/or dentures?: No Does patient usually wear dentures?: No  CIWA:    COWS:     Assessment: Patient is a 48 y old AAM , who has a hx of schizophrenia , multiple medical problems , noncompliance with medications, relational struggles with family, well known to [facility], presents again with irritability, paranoia . Pt is not cooperative , speech is minimal , has been taking his medications. Will continue support and treatment.    Treatment Plan Summary: Daily contact with patient to assess and evaluate symptoms and progress in treatment and Medication management Will restart his medication where needed. Will continue Haldol 5 mg po daily for psychosis, mood sx. Will continue Cogentin 0.5 mg po daily for EPS. Will continue Trileptal 150 mg po bid for mood sx. Will increase Trazodone to 125 mg po qhs for sleep- sleep documented as poor .   Will continue to monitor vitals ,medication compliance and treatment side effects while patient is here.  Reviewed labs - TSH,lipid panel - wnl.  CSW will start working on  disposition.Possible referral to ALF.   Medical Decision Making:  Review of Psycho-Social Stressors (1), Review or order clinical lab tests (1), Established Problem, Worsening (2), Review of Last Therapy Session (1), Review of Medication Regimen & Side Effects (2) and Review of New Medication or Change in Dosage (2)     Brionna Romanek MD 01/06/2015, 1:20 PM

## 2015-01-06 NOTE — Clinical Social Work Note (Signed)
CSW discussed aftercare planning w patient, attempted to assess his desires as guardian is asking for ALF/FCH placement and states that he cannot return to prior living arrangement at house family owns in [place].  Patient states he wants to go "anywhere but [facility]", and didn't like [facility] because "there were only old people there."  Patient terminated conversation after expressing those thoughts.    Edwyna Shell, LCSW Clinical Social Worker

## 2015-01-06 NOTE — Plan of Care (Signed)
Problem: Alteration in thought process Goal: STG-Patient does not respond to command hallucinations Outcome: Progressing Pt has denied having hallucinations this shift.  He does not appear to be responding to internal stimuli.

## 2015-01-06 NOTE — Progress Notes (Signed)
Psychoeducational Group Note  Date:  01/06/2015 Time:  2153  Group Topic/Focus:  Wrap-Up Group:   The focus of this group is to help patients review their daily goal of treatment and discuss progress on daily workbooks.  Participation Level: Did Not Attend  Participation Quality:  Not Applicable  Affect:  Not Applicable  Cognitive:  Not Applicable  Insight:  Not Applicable  Engagement in Group: Not Applicable  Additional Comments:  The patient did not attend this evening's Karaoke group and elected to remain in the dayroom.   Archie Balboa S 01/06/2015, 9:53 PM

## 2015-01-06 NOTE — Progress Notes (Signed)
Patient ID: Ivan [name], male   DOB: 1961-02-15, 54 y.o.   MRN: [id number]  DAR: Pt. Denies SI/HI and A/V Hallucinations. Patient does not report any pain or discomfort at this time. Patient's BP remains elevated however patient is taking multiple blood pressure medications and does not report any SOB, chest pain, or light headedness. Support and encouragement provided to the patient. Scheduled medications administered to patient per physician's orders. Patient receives PRN Nicotine gum to help with his nicotine craving. Patient is minimal but pleasant upon interaction. Patient is seen in the milieu at times but mainly keeps to himself. Q15 minute checks are maintained for safety.

## 2015-01-06 NOTE — Progress Notes (Signed)
D: Pt has depressed affect and mood.  Pt reports his day was "pretty good" and that his goal was "I don't want no problems, just stay to myself and do what I can do."  Pt did not attend evening group.  Pt denies SI/HI, denies hallucinations.  Pt reports chronic back pain of 8/10.  Pt has been visible in milieu with minimal peer interactions.  He has been cooperative with staff.    A: Introduced self to pt.  Met with pt 1:1 and provided support and encouragement.  Medications administered per order.  Pt refused heat pack and PRN medication for pain. R: Pt is compliant with scheduled medications.  Pt verbally contracts for safety and reports that he will notify staff of needs and concerns.  Will continue to monitor and assess.

## 2015-01-06 NOTE — BHH Group Notes (Signed)
[facility] Group Notes:  (Nursing/MHT/Case Management/Adjunct)  Date:  01/06/2015  Time:  2:52 PM  Type of Therapy:  Nurse Education  Participation Level:  Did Not Attend  Participation Quality:  Did not attend  Affect:  Did not attend  Cognitive:  Did not attend  Insight:  None  Engagement in Group:  None  Modes of Intervention:  Discussion and Education  Summary of Progress/Problems: Patient did not attend this morning nursing education group. The topic is Leisure and lifestyle changes.  Margaretmary Bayley, Verneda Hollopeter E 01/06/2015, 2:52 PM

## 2015-01-06 NOTE — Plan of Care (Signed)
Problem: Ineffective individual coping Goal: STG: Patient will remain free from self harm Outcome: Progressing Patient remains free from self harm and denies SI during suicide assessment this morning.

## 2015-01-06 NOTE — BHH Group Notes (Signed)
[facility] Group Notes:  (Counselor/Nursing/MHT/Case Management/Adjunct)  01/06/2015 1:15PM  Type of Therapy:  Group Therapy  Participation Level:  Active  Participation Quality:  Appropriate  Affect:  Flat  Cognitive:  Oriented  Insight:  Improving  Engagement in Group:  Limited  Engagement in Therapy:  Limited  Modes of Intervention:  Discussion, Exploration and Socialization  Summary of Progress/Problems: The topic for group was balance in life.  Pt participated in the discussion about when their life was in balance and out of balance and how this feels.  Pt discussed ways to get back in balance and short term goals they can work on to get where they want to be. Wandered in late.  Reluctant participant.  Named all the hospitals he has ever been at-it was quite a list.  States its the same everywhere.  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  Looks and carries himself as much older than his chronological age.   Trish Mage 01/06/2015 2:44 PM

## 2015-01-07 LAB — GLUCOSE, CAPILLARY
GLUCOSE-CAPILLARY: 103 mg/dL — AB (ref 65–99)
GLUCOSE-CAPILLARY: 125 mg/dL — AB (ref 65–99)
GLUCOSE-CAPILLARY: 139 mg/dL — AB (ref 65–99)
Glucose-Capillary: 112 mg/dL — ABNORMAL HIGH (ref 65–99)

## 2015-01-07 NOTE — Clinical Social Work Note (Signed)
Patient referred for Level 2 PASARR, awaiting QMHP screen.  Edwyna Shell, LCSW Clinical Social Worker

## 2015-01-07 NOTE — BHH Group Notes (Signed)
[facility] LCSW Aftercare Discharge Planning Group Note   01/07/2015 11:54 AM  Participation Quality:  Minimal  Mood/Affect:  Flat  Depression Rating:    Anxiety Rating:    Thoughts of Suicide:  No Will you contract for safety?   NA  Current AVH:  Denies  Plan for Discharge/Comments:  Stayed the entire time.  Did not go to [place], but if he had, and if he had chosen to sing, it would've been something by Aurelio Brash or [employer].  States that sleep and appetite are fine.  Transportation Means:   Supports:  [name]

## 2015-01-07 NOTE — BHH Group Notes (Signed)
[facility] LCSW Group Therapy  01/07/2015  1:05 PM  Type of Therapy:  Group therapy  Participation Level:  Active  Participation Quality:  Attentive  Affect:  Flat  Cognitive:  Oriented  Insight:  Limited  Engagement in Therapy:  Limited  Modes of Intervention:  Discussion, Socialization  Summary of Progress/Problems:  Chaplain was here to lead a group on themes of hope and courage.  Invited.  Chose to not attend.  [name] 01/07/2015 11:56 AM

## 2015-01-07 NOTE — Tx Team (Addendum)
Interdisciplinary Treatment Plan Update (Adult)  Date:  01/07/2015   Time Reviewed:  9:05 AM   Progress in Treatment: Attending groups: Yes. Participating in groups:  Yes. Taking medication as prescribed:  Yes. Tolerating medication:  Yes. Family/Significant othe contact made:  CSW spoke w guardian, Trygg Hopf Patient understands diagnosis:  No, patient has limited insight into illness Discussing patient identified problems/goals with staff:  Yes, see initial care plan. Medical problems stabilized or resolved:  Yes. Developed elevated BP, MD monitoring, reports pain at 8/10 Denies suicidal/homicidal ideation: Yes. Issues/concerns per patient self-inventory:  No. Other:  New problem(s) identified:  Per sister, patient cannot return to current home.  Feels he needs more services to support in community.  Has resisted care in community  Discharge Plan or Barriers:  Outpatient lack of adherence to treatment, significant physical health issues exacerbated by mental illness and resultant isolation.  Reason for Continuation of Hospitalization: Medication stabilization  Comments: Ivan [name] is an 54 y.o. male brought to [facility] ED by EMS after he was found yelling in his front yard. He told the ED staff that he did not want to harm himself or others, but hat he was not happy where he was. UPon assessment he presents as somewhat disheveled with fair eye contact and loud speech. His mood was angry and his affect was congruent. He said, "Just send me to [facility]. That's the best place I could go. They have good doctors there."   01/07/15:  Patient appears to be more irritable, withdrawn, remains in bed in the AM.  Sister stating that patient has not been successful at home, has multiple medical issues in addition to mental health disorder which patient does not acknowledge.  Patient has said he wants placement out of [facility], guardian completing paperwork for placement.  Care  coordinator requested from [facility].  Patient remains irritable, presents w depressed affect and mood, currently has elevated BP, MD aware.  Medications include trazodone, trileptal, Haldol, cogentin.  Multiple medications for treatment of diabetes, hypertension, CKD.   Estimated length of stay:  5 - 7 days  New goal(s):  Medication stabilization, development of plan of care for outpatient services, referral for possible out of home placement.    Review of initial/current patient goals per problem list:   See initial plan of care  Attendees: Patient:  01/07/2015 9:05 AM   Family:   01/07/2015 9:05 AM   Physician:  Ursula Alert, MD 01/07/2015 9:05 AM   Nursing:   Gaylan Gerold, RN 01/07/2015 9:05 AM   CSW:    [name], LCSW   01/07/2015 9:05 AM   Other:  Edwyna Shell, LCSW 01/07/2015 9:05 AM   Other:  Chong Sicilian, RN 01/07/2015 9:05 AM   Other:  Lars Pinks, Nurse CM 01/07/2015 9:05 AM   Other:  Lucinda Dell, [facility] 01/07/2015 9:05 AM   Other:  Norberto Sorenson, [place]  01/07/2015 9:05 AM   Other: Christa, RN 01/07/2015 9:05 AM   Other:  01/07/2015 9:05 AM   Other:  01/07/2015 9:05 AM   Other:  01/07/2015 9:05 AM   Other:  01/07/2015 9:05 AM   Other:   01/07/2015 9:05 AM    Scribe for Treatment Team:   Beverely Pace, 01/07/2015 9:05 AM

## 2015-01-07 NOTE — Plan of Care (Signed)
Problem: Alteration in thought process Goal: LTG-Patient is able to perceive the environment accurately Outcome: Completed/Met Date Met:  01/07/15 Patient perceived the environment accurately.

## 2015-01-07 NOTE — Progress Notes (Signed)
[facility] MD Progress Note  01/07/2015 1:21 PM Ivan [name]  MRN:  [id number] Subjective: Patient states " I am all right . Its always family problems. I took my medications when I could ."  Objective: Patient seen and chart reviewed.Discussed patient with treatment team.Ivan [name] is a 54y.o. AA male with a history of Diabetes Mellitus,Hypertension ,Chronic back pain ,Pedal edema ,Hx of Elevated LFTs due to Depakote ,CKD Stage [name] as well as schizophrenia ,  was brought to [facility] ED by EMS after he was found yelling in his front yard.  Patient this AM , seen in bed. Patient today appears to be more cooperative than the previous two days. Pt sat down during the evaluation and was able to talk for a few minutes , but cut it short , and left after a while. Patient denies any new concerns today. Pt reports that is always due to family problems that he ends up here and that there is nothing going on at this time with which he may need writer's help. Pt continues to be guarded , has poor eye contact, lacks insight in to his illness. Pt denies SI /HI/AH/VH.  Pt has been in and out of [facility] several times, his sister is his legal guardian. Pt has not been compliant with his medications prior to this admission , which could have resulted in his recent decompensation. CSW will continue to  work on referral to ALF/SNF due to patient's multiple psychiatric as well as medical problems.         Principal Problem: Schizophrenia, multiple episodes , currently in acute episode Diagnosis:   Patient Active Problem List   Diagnosis Date Noted  . Schizophrenia [F20.9] 01/04/2015  . CKD (chronic kidney disease) stage 3, GFR 30-59 ml/min [N18.3]   . Pedal edema [R60.0]   . Chronic pain syndrome [G89.4]   . Essential hypertension [I10]   . Edema of both legs [R60.0] 04/07/2013  . Type I diabetes mellitus with complication, uncontrolled [E10.69] 04/01/2013   Total Time spent with patient: 30  minutes   Past Medical History:  Past Medical History  Diagnosis Date  . Diabetes mellitus   . Hypertension   . Bipolar 1 disorder   . Schizoaffective disorder   . Hypokalemia 04/02/2013  . Edema of both legs 04/07/2013  . Paranoid schizophrenia     Past Surgical History  Procedure Laterality Date  . Breast surgery    . Fracture surgery      ribs and arm   Family History:  Family History  Problem Relation Age of Onset  . Depression Neg Hx    Social History:  History  Alcohol Use No     History  Drug Use No    History   Social History  . Marital Status: Divorced    Spouse Name: N/A  . Number of Children: N/A  . Years of Education: N/A   Social History Main Topics  . Smoking status: Current Every Day Smoker -- 0.50 packs/day for 39 years    Types: Cigarettes, Cigars  . Smokeless tobacco: Never Used  . Alcohol Use: No  . Drug Use: No  . Sexual Activity: No   Other Topics Concern  . None   Social History Narrative   Additional History:    Sleep: Fair  Appetite:  Fair     Musculoskeletal: Strength & Muscle Tone: within normal limits Gait & Station: normal Patient leans: N/A   Psychiatric Specialty Exam: Physical Exam  Review  of Systems  Cardiovascular: Positive for leg swelling (improving).  Psychiatric/Behavioral: Negative.   All other systems reviewed and are negative.   Blood pressure 132/77, pulse 75, temperature 98.1 F (36.7 C), temperature source Oral, resp. rate 16, height 6' 2.5" (1.892 m), weight 117.482 kg (259 lb).Body mass index is 32.82 kg/(m^2).  General Appearance: Fairly Groomed  Engineer, water::  Poor  Speech:  minimal  Volume:  Normal  Mood:  Irritable  But less irritable than yesterday  Affect:  Congruent  Thought Process:  Intact  Orientation:  Full (Time, Place, and Person)  Thought Content:  Paranoid Ideation  Suicidal Thoughts:  No  Homicidal Thoughts:  No  Memory:  Immediate;   Fair Recent;   Fair Remote;   Fair   Judgement:  Impaired  Insight:  Shallow  Psychomotor Activity:  Decreased  Concentration:  Poor  Recall:  Poor  Fund of Knowledge:Fair  Language: Fair  Akathisia:  No  Handed:  Right  AIMS (if indicated):     Assets:  Social Support  ADL's:  Intact  Cognition: WNL  Sleep:  Number of Hours: 5.5     Current Medications: Current Facility-Administered Medications  Medication Dose Route Frequency Provider Last Rate Last Dose  . acetaminophen (TYLENOL) tablet 650 mg  650 mg Oral Q6H PRN Niel Hummer, NP      . alum & mag hydroxide-simeth (MAALOX/MYLANTA) 200-200-20 MG/5ML suspension 30 mL  30 mL Oral Q4H PRN Niel Hummer, NP      . amLODipine (NORVASC) tablet 5 mg  5 mg Oral BID Niel Hummer, NP   5 mg at 01/07/15 0843  . aspirin EC tablet 81 mg  81 mg Oral Daily Niel Hummer, NP   81 mg at 01/07/15 0843  . benztropine (COGENTIN) tablet 0.5 mg  0.5 mg Oral QPM Izzabelle Bouley, MD   0.5 mg at 01/06/15 1726  . cloNIDine (CATAPRES) tablet 0.1 mg  0.1 mg Oral BID Niel Hummer, NP   0.1 mg at 01/07/15 [id number]  . furosemide (LASIX) tablet 40 mg  40 mg Oral BID Niel Hummer, NP   40 mg at 01/07/15 [id number]  . haloperidol (HALDOL) tablet 5 mg  5 mg Oral QPM Abir Craine, MD   5 mg at 01/06/15 1726  . hydrALAZINE (APRESOLINE) tablet 100 mg  100 mg Oral TID Niel Hummer, NP   100 mg at 01/07/15 1213  . hydrOXYzine (ATARAX/VISTARIL) tablet 25 mg  25 mg Oral Q6H PRN Niel Hummer, NP   25 mg at 01/07/15 0034  . insulin aspart (novoLOG) injection 0-15 Units  0-15 Units Subcutaneous TID WC Ursula Alert, MD   2 Units at 01/07/15 1213  . linagliptin (TRADJENTA) tablet 5 mg  5 mg Oral Daily Niel Hummer, NP   5 mg at 01/07/15 0843  . magnesium hydroxide (MILK OF MAGNESIA) suspension 30 mL  30 mL Oral Daily PRN Niel Hummer, NP      . nicotine polacrilex (NICORETTE) gum 2 mg  2 mg Oral PRN Laverle Hobby, PA-C   2 mg at 01/07/15 0845  . OXcarbazepine (TRILEPTAL) tablet 150 mg  150 mg Oral BID  Ursula Alert, MD   150 mg at 01/07/15 0843  . potassium chloride (K-DUR) CR tablet 10 mEq  10 mEq Oral BID Niel Hummer, NP   10 mEq at 01/07/15 0843  . traZODone (DESYREL) tablet 125 mg  125 mg Oral QHS Ursula Alert, MD  125 mg at 01/06/15 2114    Lab Results:  Results for orders placed or performed during the hospital encounter of 01/04/15 (from the past 48 hour(s))  Glucose, capillary     Status: None   Collection Time: 01/05/15  4:50 PM  Result Value Ref Range   Glucose-Capillary 91 65 - 99 mg/dL   Comment 1 Notify RN    Comment 2 Document in Chart   Glucose, capillary     Status: Abnormal   Collection Time: 01/05/15  8:31 PM  Result Value Ref Range   Glucose-Capillary 126 (H) 65 - 99 mg/dL  Lipid panel     Status: Abnormal   Collection Time: 01/06/15  6:20 AM  Result Value Ref Range   Cholesterol 137 0 - 200 mg/dL   Triglycerides 113 <150 mg/dL   HDL 30 (L) >40 mg/dL   Total CHOL/HDL Ratio 4.6 RATIO   VLDL 23 0 - 40 mg/dL   LDL Cholesterol 84 0 - 99 mg/dL    Comment:        Total Cholesterol/HDL:CHD Risk Coronary Heart Disease Risk Table                     Men   Women  1/2 Average Risk   3.4   3.3  Average Risk       5.0   4.4  2 X Average Risk   9.6   7.1  3 X Average Risk  23.4   11.0        Use the calculated Patient Ratio above and the CHD Risk Table to determine the patient's CHD Risk.        ATP [name] CLASSIFICATION (LDL):  <100     mg/dL   Optimal  100-129  mg/dL   Near or Above                    Optimal  130-159  mg/dL   Borderline  160-189  mg/dL   High  >190     mg/dL   Very High Performed at [facility]   TSH     Status: None   Collection Time: 01/06/15  6:20 AM  Result Value Ref Range   TSH 0.619 0.350 - 4.500 uIU/mL    Comment: Performed at [facility]  Glucose, capillary     Status: None   Collection Time: 01/06/15  6:30 AM  Result Value Ref Range   Glucose-Capillary 97 65 - 99 mg/dL  Glucose, capillary      Status: Abnormal   Collection Time: 01/06/15 12:05 PM  Result Value Ref Range   Glucose-Capillary 111 (H) 65 - 99 mg/dL   Comment 1 Notify RN    Comment 2 Document in Chart   Glucose, capillary     Status: Abnormal   Collection Time: 01/06/15  5:01 PM  Result Value Ref Range   Glucose-Capillary 124 (H) 65 - 99 mg/dL  Glucose, capillary     Status: Abnormal   Collection Time: 01/06/15  8:31 PM  Result Value Ref Range   Glucose-Capillary 115 (H) 65 - 99 mg/dL  Glucose, capillary     Status: Abnormal   Collection Time: 01/07/15  5:41 AM  Result Value Ref Range   Glucose-Capillary 103 (H) 65 - 99 mg/dL  Glucose, capillary     Status: Abnormal   Collection Time: 01/07/15 12:00 PM  Result Value Ref Range   Glucose-Capillary 125 (H) 65 - 99  mg/dL    Physical Findings: AIMS: Facial and Oral Movements Muscles of Facial Expression: None, normal Lips and Perioral Area: None, normal Jaw: None, normal Tongue: None, normal,Extremity Movements Upper (arms, wrists, hands, fingers): None, normal Lower (legs, knees, ankles, toes): None, normal, Trunk Movements Neck, shoulders, hips: None, normal, Overall Severity Severity of abnormal movements (highest score from questions above): None, normal Incapacitation due to abnormal movements: None, normal Patient's awareness of abnormal movements (rate only patient's report): No Awareness, Dental Status Current problems with teeth and/or dentures?: No Does patient usually wear dentures?: No  CIWA:    COWS:     Assessment: Patient is a 67 y old AAM , who has a hx of schizophrenia , multiple medical problems , noncompliance with medications, relational struggles with family, well known to [facility], presents again with irritability, paranoia . Pt is very guarded , does not participate fully , speech is minimal , has been taking his medications. Will continue support and treatment.    Treatment Plan Summary: Daily contact with patient to assess and  evaluate symptoms and progress in treatment and Medication management Will restart his medication where needed. Will continue Haldol 5 mg po daily for psychosis, mood sx. Will continue Cogentin 0.5 mg po daily for EPS. Will continue Trileptal 150 mg po bid for mood sx. Will continue Trazodone 125 mg po qhs for sleep- increased last night- sleep documented as 5.5 hrs last night- which is an improvement.   Will continue to monitor vitals ,medication compliance and treatment side effects while patient is here.   CSW will start working on disposition.Possible referral to ALF.   Medical Decision Making:  Review of Psycho-Social Stressors (1), Review of Last Therapy Session (1) and Review of Medication Regimen & Side Effects (2)     Oscar Forman MD 01/07/2015, 1:21 PM

## 2015-01-07 NOTE — Progress Notes (Signed)
Patient ID: Ivan [name], male   DOB: 06-11-1961, 54 y.o.   MRN: [id number]  DAR: Pt. Denies SI/HI and A/V Hallucinations. Affect is flat and mood continues to be depressed. Patient reports he slept well last night, appetite is good, energy level normal, and concentration level is good. Patient rates his depression 9/10, hopelessness and anxiety 8/10. Patient does not report any pain or discomfort at this time. Support and encouragement provided to the patient. Scheduled medications administered to patient per physician's orders. PRN Nicorette gum provided to patient for cravings. Patient is minimal and forwards little but he is cooperative. Patient is seen in the milieu at times and is attending some groups however he mostly stays to himself. Q15 minute checks are maintained for safety.

## 2015-01-07 NOTE — Clinical Social Work Note (Signed)
Call from [facility], cannot be referred for care coordination unless he is active w ACT Team.  Needs for patient to agree that he wants ACT services.  [facility] informed patient has guardian, will staff on Tuesday for exception so he will qualify for ACT under [employer] funds.  ACT teams are [facility], [facility], and [facility] (based in [place]).    Edwyna Shell, LCSW Clinical Social Worker

## 2015-01-08 LAB — GLUCOSE, CAPILLARY
GLUCOSE-CAPILLARY: 105 mg/dL — AB (ref 65–99)
Glucose-Capillary: 125 mg/dL — ABNORMAL HIGH (ref 65–99)
Glucose-Capillary: 129 mg/dL — ABNORMAL HIGH (ref 65–99)
Glucose-Capillary: 126 mg/dL — ABNORMAL HIGH (ref 65–99)

## 2015-01-08 NOTE — [facility] Group Notes (Signed)
[facility] Group Notes: (Clinical Social Work)   01/08/2015      Type of Therapy:  Group Therapy   Participation Level:  Did Not Attend despite MHT prompting   Selmer Dominion, LCSW 01/08/2015, 12:38 PM

## 2015-01-08 NOTE — Progress Notes (Signed)
[facility] MD Progress Note  01/08/2015 10:35 AM Brantly Freer  MRN:  [id number] Subjective: Leave me alone.  I am fine.    Objective: Patient seen and chart reviewed.  Patient remains very labile, irritable and incoherent.  Her speech is very fast, rambling and pressured.  He told that he is not sure why he is in the hospital.  He plain his family member for everything wrong.  He is taking his medication and denies any side effects.  He is complaining of pain in his back however did not elaborate more symptoms and walk out.  His attention concentration is poor.  He was noticed laughing giggling and then irritable.  He maintained poor eye contact.  His behavior remains very labile however he denies any suicidal thoughts or homicidal thought.  Pt has been in and out of [facility] several times, his sister is his legal guardian. Pt has not been compliant with his medications prior to this admission , which could have resulted in his recent decompensation. CSW will continue to  work on referral to ALF/SNF due to patient's multiple psychiatric as well as medical problems.   Principal Problem: Schizophrenia, multiple episodes , currently in acute episode Diagnosis:   Patient Active Problem List   Diagnosis Date Noted  . Schizophrenia [F20.9] 01/04/2015  . CKD (chronic kidney disease) stage 3, GFR 30-59 ml/min [N18.3]   . Pedal edema [R60.0]   . Chronic pain syndrome [G89.4]   . Essential hypertension [I10]   . Edema of both legs [R60.0] 04/07/2013  . Type I diabetes mellitus with complication, uncontrolled [E10.69] 04/01/2013   Total Time spent with patient: 20 minutes   Past Medical History:  Past Medical History  Diagnosis Date  . Diabetes mellitus   . Hypertension   . Bipolar 1 disorder   . Schizoaffective disorder   . Hypokalemia 04/02/2013  . Edema of both legs 04/07/2013  . Paranoid schizophrenia     Past Surgical History  Procedure Laterality Date  . Breast surgery    . Fracture surgery       ribs and arm   Family History:  Family History  Problem Relation Age of Onset  . Depression Neg Hx    Social History:  History  Alcohol Use No     History  Drug Use No    History   Social History  . Marital Status: Divorced    Spouse Name: N/A  . Number of Children: N/A  . Years of Education: N/A   Social History Main Topics  . Smoking status: Current Every Day Smoker -- 0.50 packs/day for 39 years    Types: Cigarettes, Cigars  . Smokeless tobacco: Never Used  . Alcohol Use: No  . Drug Use: No  . Sexual Activity: No   Other Topics Concern  . None   Social History Narrative   Additional History:    Sleep: Fair  Appetite:  Fair     Musculoskeletal: Strength & Muscle Tone: within normal limits Gait & Station: normal Patient leans: N/A   Psychiatric Specialty Exam: Physical Exam  Review of Systems  Constitutional: Negative.   HENT: Negative.   Musculoskeletal: Positive for back pain and joint pain.  Skin: Negative.   Neurological: Negative for dizziness and tremors.  Psychiatric/Behavioral: The patient is nervous/anxious.     Blood pressure 151/86, pulse 100, temperature 97.8 F (36.6 C), temperature source Oral, resp. rate 18, height 6' 2.5" (1.892 m), weight 117.482 kg (259 lb), SpO2 98 %.Body mass  index is 32.82 kg/(m^2).  General Appearance: Disheveled  Eye Contact::  Poor  Speech:  Pressured and Fast and rambling  Volume:  Normal  Mood:  Irritable labile   Affect:  Non-Congruent, Inappropriate and Labile  Thought Process:  Circumstantial, Disorganized, Irrelevant, Loose and Tangential  Orientation:  Full (Time, Place, and Person)  Thought Content:  Paranoid Ideation  Suicidal Thoughts:  No  Homicidal Thoughts:  No  Memory:  Immediate;   Fair Recent;   Fair Remote;   Fair  Judgement:  Impaired  Insight:  Shallow  Psychomotor Activity:  Increased  Concentration:  Poor  Recall:  Poor  Fund of Knowledge:Poor  Language: Poor   Akathisia:  No  Handed:  Right  AIMS (if indicated):     Assets:  Social Support  ADL's:  Intact  Cognition: WNL  Sleep:  Number of Hours: 4     Current Medications: Current Facility-Administered Medications  Medication Dose Route Frequency Provider Last Rate Last Dose  . acetaminophen (TYLENOL) tablet 650 mg  650 mg Oral Q6H PRN Niel Hummer, NP      . alum & mag hydroxide-simeth (MAALOX/MYLANTA) 200-200-20 MG/5ML suspension 30 mL  30 mL Oral Q4H PRN Niel Hummer, NP      . amLODipine (NORVASC) tablet 5 mg  5 mg Oral BID Niel Hummer, NP   5 mg at 01/08/15 0849  . aspirin EC tablet 81 mg  81 mg Oral Daily Niel Hummer, NP   81 mg at 01/08/15 0849  . benztropine (COGENTIN) tablet 0.5 mg  0.5 mg Oral QPM Saramma Eappen, MD   0.5 mg at 01/07/15 1725  . cloNIDine (CATAPRES) tablet 0.1 mg  0.1 mg Oral BID Niel Hummer, NP   0.1 mg at 01/08/15 0849  . furosemide (LASIX) tablet 40 mg  40 mg Oral BID Niel Hummer, NP   40 mg at 01/08/15 0849  . haloperidol (HALDOL) tablet 5 mg  5 mg Oral QPM Saramma Eappen, MD   5 mg at 01/07/15 1725  . hydrALAZINE (APRESOLINE) tablet 100 mg  100 mg Oral TID Niel Hummer, NP   100 mg at 01/08/15 0849  . hydrOXYzine (ATARAX/VISTARIL) tablet 25 mg  25 mg Oral Q6H PRN Niel Hummer, NP   25 mg at 01/07/15 0034  . insulin aspart (novoLOG) injection 0-15 Units  0-15 Units Subcutaneous TID WC Ursula Alert, MD   2 Units at 01/08/15 0630  . linagliptin (TRADJENTA) tablet 5 mg  5 mg Oral Daily Niel Hummer, NP   5 mg at 01/08/15 0849  . magnesium hydroxide (MILK OF MAGNESIA) suspension 30 mL  30 mL Oral Daily PRN Niel Hummer, NP      . nicotine polacrilex (NICORETTE) gum 2 mg  2 mg Oral PRN Laverle Hobby, PA-C   2 mg at 01/08/15 0851  . OXcarbazepine (TRILEPTAL) tablet 150 mg  150 mg Oral BID Ursula Alert, MD   150 mg at 01/08/15 0849  . potassium chloride (K-DUR) CR tablet 10 mEq  10 mEq Oral BID Niel Hummer, NP   10 mEq at 01/08/15 0849  .  traZODone (DESYREL) tablet 125 mg  125 mg Oral QHS Ursula Alert, MD   125 mg at 01/07/15 2057    Lab Results:  Results for orders placed or performed during the hospital encounter of 01/04/15 (from the past 48 hour(s))  Glucose, capillary     Status: Abnormal   Collection Time:  01/06/15 12:05 PM  Result Value Ref Range   Glucose-Capillary 111 (H) 65 - 99 mg/dL   Comment 1 Notify RN    Comment 2 Document in Chart   Glucose, capillary     Status: Abnormal   Collection Time: 01/06/15  5:01 PM  Result Value Ref Range   Glucose-Capillary 124 (H) 65 - 99 mg/dL  Glucose, capillary     Status: Abnormal   Collection Time: 01/06/15  8:31 PM  Result Value Ref Range   Glucose-Capillary 115 (H) 65 - 99 mg/dL  Glucose, capillary     Status: Abnormal   Collection Time: 01/07/15  5:41 AM  Result Value Ref Range   Glucose-Capillary 103 (H) 65 - 99 mg/dL  Glucose, capillary     Status: Abnormal   Collection Time: 01/07/15 12:00 PM  Result Value Ref Range   Glucose-Capillary 125 (H) 65 - 99 mg/dL  Glucose, capillary     Status: Abnormal   Collection Time: 01/07/15  4:50 PM  Result Value Ref Range   Glucose-Capillary 112 (H) 65 - 99 mg/dL  Glucose, capillary     Status: Abnormal   Collection Time: 01/07/15  8:50 PM  Result Value Ref Range   Glucose-Capillary 139 (H) 65 - 99 mg/dL   Comment 1 Document in Chart    Comment 2 Repeat Test   Glucose, capillary     Status: Abnormal   Collection Time: 01/08/15  5:49 AM  Result Value Ref Range   Glucose-Capillary 125 (H) 65 - 99 mg/dL    Physical Findings: AIMS: Facial and Oral Movements Muscles of Facial Expression: None, normal Lips and Perioral Area: None, normal Jaw: None, normal Tongue: None, normal,Extremity Movements Upper (arms, wrists, hands, fingers): None, normal Lower (legs, knees, ankles, toes): None, normal, Trunk Movements Neck, shoulders, hips: None, normal, Overall Severity Severity of abnormal movements (highest score from  questions above): None, normal Incapacitation due to abnormal movements: None, normal Patient's awareness of abnormal movements (rate only patient's report): No Awareness, Dental Status Current problems with teeth and/or dentures?: No Does patient usually wear dentures?: No  CIWA:    COWS:     Assessment:  Patient is a 77 y old AAM , who has a hx of schizophrenia , multiple medical problems , noncompliance with medications, relational struggles with family, well known to [facility], presents again with irritability, paranoia .  Patient remains very labile, irritable and disorganized.  He does not participate in the groups.  His his speech is pressured and incoherent.  He needs inpatient psychiatric treatment.    Treatment Plan Summary: Daily contact with patient to assess and evaluate symptoms and progress in treatment and Medication management Will continue Haldol 5 mg po daily for psychosis, mood sx. Will continue Cogentin 0.5 mg po daily for EPS. Will continue Trileptal 150 mg po bid for mood sx. Will continue Trazodone 125 mg po qhs for sleep-  Patient complained of back pain but did not elaborate any further symptoms.  Encouraged to take Tylenol if symptoms again appears. Will continue to monitor vitals ,medication compliance and treatment side effects while patient is here.   CSW will start working on disposition.Possible referral to ALF.   Medical Decision Making:  Review of Psycho-Social Stressors (1), Review of Last Therapy Session (1) and Review of Medication Regimen & Side Effects (2)     ARFEEN,SYED T. MD 01/08/2015, 10:35 AM

## 2015-01-08 NOTE — Progress Notes (Signed)
Patient has been up in the dayroom watching tv with no interaction with peers. He is very quiet and does not initiate conversations unless he request some nicotine gum. He attended wrap up group this evening and has voiced no complaints. Ivan [name] is compliant with his scheduled medications. He currently denies having pain, -si/hi/a/v hall. Support and encouragement offered, safety maintained on unit with 15 min checks.

## 2015-01-08 NOTE — Progress Notes (Signed)
Adult Psychoeducational Group Note  Date:  01/08/2015 Time:  8:58 PM  Group Topic/Focus:  Wrap-Up Group:   The focus of this group is to help patients review their daily goal of treatment and discuss progress on daily workbooks.  Participation Level:  Active  Participation Quality:  Appropriate  Affect:  Appropriate  Cognitive:  Appropriate  Insight: Appropriate  Engagement in Group:  Engaged  Modes of Intervention:  Discussion  Additional Comments: The patient  expressed that he did  not attend groups today.  Nash Shearer 01/08/2015, 8:58 PM

## 2015-01-08 NOTE — [facility] Group Notes (Signed)
[facility] Group Notes:  (Nursing/MHT/Case Management/Adjunct)  Date:  01/08/2015  Time:  11:28 AM  Type of Therapy:  Psychoeducational Skills  Participation Level:  Did Not Attend  Participation Quality:  Did Not Attend  Affect:  Did Not Attend  Cognitive:  Did Not Attend  Insight:  None  Engagement in Group:  Did Not Attend  Modes of Intervention:  Did Not Attend  Summary of Progress/Problems: Pt did not attend patient self inventory group.   Benancio Deeds Shanta 01/08/2015, 11:28 AM

## 2015-01-08 NOTE — Progress Notes (Signed)
Writer spoke with patient 1:1 and he reports having had a good day. He had been in his room earlier resting and has been observed in the dayroom watching tv with no interaction with peers. He denies si/hi/a/v hallucinations. He is compliant with his medications scheduled. Support given , safety maintained on unit with 15 min checks.

## 2015-01-08 NOTE — Progress Notes (Signed)
Patient ID: Ivan [name], male   DOB: [birth year], 54 y.o.   MRN: [id number]   D: Pt has been very flat and depressed on the unit today. Pt stayed in the bed most of the day, he did not attend any groups nor did he engage in treatment. Pt refused to sign his self inventory sheet, he reported that he would take care of later. Pt reported being negative SI/HI, no AH/VH noted. A: 15 min checks continued for patient safety. R: Pt safety maintained.

## 2015-01-08 NOTE — Plan of Care (Signed)
Problem: Alteration in thought process Goal: LTG-Patient has not harmed self or others in at least 2 days Outcome: Progressing Patient has not harmed self or others and has not voiced feeling suicidal. Patient remains safe with 15 min checks.

## 2015-01-08 NOTE — Progress Notes (Signed)
[place] Group Notes:  (Nursing/MHT/Case Management/Adjunct)  Date:  01/08/2015  Time:  12:01 AM  Type of Therapy:  Group Therapy  Participation Level:  Minimal  Participation Quality:  Appropriate  Affect:  Appropriate and Flat  Cognitive:  Appropriate  Insight:  Appropriate  Engagement in Group:  Improving  Modes of Intervention:  Socialization and Support  Summary of Progress/Problems: Pt. Was engaged in group discussion. Pt. Stated an early warning sign was he gets agitated easily.  Lanell Persons 01/08/2015, 12:01 AM

## 2015-01-09 DIAGNOSIS — F200 Paranoid schizophrenia: Secondary | ICD-10-CM

## 2015-01-09 LAB — GLUCOSE, CAPILLARY
GLUCOSE-CAPILLARY: 104 mg/dL — AB (ref 65–99)
Glucose-Capillary: 106 mg/dL — ABNORMAL HIGH (ref 65–99)
Glucose-Capillary: 177 mg/dL — ABNORMAL HIGH (ref 65–99)
Glucose-Capillary: 144 mg/dL — ABNORMAL HIGH (ref 65–99)

## 2015-01-09 NOTE — Progress Notes (Signed)
Patient ID: Ivan [name], male   DOB: 1961-06-17, 54 y.o.   MRN: [id number]   D: Pt has been very flat and depressed on the unit today. Pt has been in the bed most of the day, he did not attend groups nor did he engage in treatment. Pt would not talk to staff, nor his peers. Pt did take medications without any problems. Pt reported being negative SI/HI, no AH/VH noted. A: 15 min checks continued for patient safety. R: Pt safety maintained.

## 2015-01-09 NOTE — Progress Notes (Signed)
[facility] MD Progress Note  01/09/2015 12:56 PM Ivan [name]  MRN:  [id number] Subjective: I don'[name] have any mental problem.    Objective: Patient seen and chart reviewed.  Patient denies any suicidal thoughts or homicidal thoughts however he remains very paranoid, guarded and irritable at times.  He did not go to any groups.  He does not believe he has any psychiatric illness .  He endorse he has issues with this family members .  Though he is taking his medication but he has limited insight into his illness.  He denies any side effects.  Social worker is working for assisted living facility/skilled nursing facility due to patient's noncompliance with medication and limited insight and judgment into his illness.    Principal Problem: Schizophrenia, multiple episodes , currently in acute episode Diagnosis:   Patient Active Problem List   Diagnosis Date Noted  . Schizophrenia [F20.9] 01/04/2015  . CKD (chronic kidney disease) stage 3, GFR 30-59 ml/min [N18.3]   . Pedal edema [R60.0]   . Chronic pain syndrome [G89.4]   . Essential hypertension [I10]   . Edema of both legs [R60.0] 04/07/2013  . Type I diabetes mellitus with complication, uncontrolled [E10.69] 04/01/2013   Total Time spent with patient: 20 minutes   Past Medical History:  Past Medical History  Diagnosis Date  . Diabetes mellitus   . Hypertension   . Bipolar 1 disorder   . Schizoaffective disorder   . Hypokalemia 04/02/2013  . Edema of both legs 04/07/2013  . Paranoid schizophrenia     Past Surgical History  Procedure Laterality Date  . Breast surgery    . Fracture surgery      ribs and arm   Family History:  Family History  Problem Relation Age of Onset  . Depression Neg Hx    Social History:  History  Alcohol Use No     History  Drug Use No    History   Social History  . Marital Status: Divorced    Spouse Name: N/A  . Number of Children: N/A  . Years of Education: N/A   Social History Main Topics  .  Smoking status: Current Every Day Smoker -- 0.50 packs/day for 39 years    Types: Cigarettes, Cigars  . Smokeless tobacco: Never Used  . Alcohol Use: No  . Drug Use: No  . Sexual Activity: No   Other Topics Concern  . None   Social History Narrative   Additional History:    Sleep: Fair  Appetite:  Fair     Musculoskeletal: Strength & Muscle Tone: within normal limits Gait & Station: normal Patient leans: N/A   Psychiatric Specialty Exam: Physical Exam  Review of Systems  Constitutional: Negative.   Cardiovascular: Negative for chest pain and palpitations.  Musculoskeletal:       Leg swelling  Skin: Negative for itching and rash.  Neurological: Negative for tremors.  Psychiatric/Behavioral: The patient is nervous/anxious.     Blood pressure 148/90, pulse 85, temperature 98.2 F (36.8 C), temperature source Oral, resp. rate 16, height 6' 2.5" (1.892 m), weight 117.482 kg (259 lb), SpO2 98 %.Body mass index is 32.82 kg/(m^2).  General Appearance: Disheveled  Eye Contact::  Poor  Speech:  Slow  Volume:  Normal  Mood:  Irritable labile   Affect:  Non-Congruent, Inappropriate and Labile  Thought Process:  Circumstantial, Disorganized, Irrelevant, Loose and Tangential  Orientation:  Full (Time, Place, and Person)  Thought Content:  Paranoid Ideation  Suicidal Thoughts:  No  Homicidal Thoughts:  No  Memory:  Immediate;   Fair Recent;   Fair Remote;   Fair  Judgement:  Impaired  Insight:  Shallow  Psychomotor Activity:  Normal  Concentration:  Poor  Recall:  Poor  Fund of Knowledge:Poor  Language: Poor  Akathisia:  No  Handed:  Right  AIMS (if indicated):     Assets:  Social Support  ADL's:  Intact  Cognition: WNL  Sleep:  Number of Hours: 4     Current Medications: Current Facility-Administered Medications  Medication Dose Route Frequency Provider Last Rate Last Dose  . acetaminophen (TYLENOL) tablet 650 mg  650 mg Oral Q6H PRN Niel Hummer, NP       . alum & mag hydroxide-simeth (MAALOX/MYLANTA) 200-200-20 MG/5ML suspension 30 mL  30 mL Oral Q4H PRN Niel Hummer, NP      . amLODipine (NORVASC) tablet 5 mg  5 mg Oral BID Niel Hummer, NP   5 mg at 01/09/15 0908  . aspirin EC tablet 81 mg  81 mg Oral Daily Niel Hummer, NP   81 mg at 01/09/15 [id number]  . benztropine (COGENTIN) tablet 0.5 mg  0.5 mg Oral QPM Saramma Eappen, MD   0.5 mg at 01/08/15 1702  . cloNIDine (CATAPRES) tablet 0.1 mg  0.1 mg Oral BID Niel Hummer, NP   0.1 mg at 01/09/15 0908  . furosemide (LASIX) tablet 40 mg  40 mg Oral BID Niel Hummer, NP   40 mg at 01/09/15 0908  . haloperidol (HALDOL) tablet 5 mg  5 mg Oral QPM Ursula Alert, MD   5 mg at 01/08/15 1702  . hydrALAZINE (APRESOLINE) tablet 100 mg  100 mg Oral TID Niel Hummer, NP   100 mg at 01/09/15 1248  . hydrOXYzine (ATARAX/VISTARIL) tablet 25 mg  25 mg Oral Q6H PRN Niel Hummer, NP   25 mg at 01/07/15 0034  . insulin aspart (novoLOG) injection 0-15 Units  0-15 Units Subcutaneous TID WC Ursula Alert, MD   2 Units at 01/08/15 1704  . linagliptin (TRADJENTA) tablet 5 mg  5 mg Oral Daily Niel Hummer, NP   5 mg at 01/09/15 [id number]  . magnesium hydroxide (MILK OF MAGNESIA) suspension 30 mL  30 mL Oral Daily PRN Niel Hummer, NP      . nicotine polacrilex (NICORETTE) gum 2 mg  2 mg Oral PRN Laverle Hobby, PA-C   2 mg at 01/09/15 1248  . OXcarbazepine (TRILEPTAL) tablet 150 mg  150 mg Oral BID Ursula Alert, MD   150 mg at 01/09/15 0907  . potassium chloride (K-DUR) CR tablet 10 mEq  10 mEq Oral BID Niel Hummer, NP   10 mEq at 01/09/15 0908  . traZODone (DESYREL) tablet 125 mg  125 mg Oral QHS Ursula Alert, MD   125 mg at 01/08/15 2140    Lab Results:  Results for orders placed or performed during the hospital encounter of 01/04/15 (from the past 48 hour(s))  Glucose, capillary     Status: Abnormal   Collection Time: 01/07/15  4:50 PM  Result Value Ref Range   Glucose-Capillary 112 (H) 65 - 99 mg/dL   Glucose, capillary     Status: Abnormal   Collection Time: 01/07/15  8:50 PM  Result Value Ref Range   Glucose-Capillary 139 (H) 65 - 99 mg/dL   Comment 1 Document in Chart    Comment 2 Repeat Test   Glucose,  capillary     Status: Abnormal   Collection Time: 01/08/15  5:49 AM  Result Value Ref Range   Glucose-Capillary 125 (H) 65 - 99 mg/dL  Glucose, capillary     Status: Abnormal   Collection Time: 01/08/15 11:57 AM  Result Value Ref Range   Glucose-Capillary 105 (H) 65 - 99 mg/dL  Glucose, capillary     Status: Abnormal   Collection Time: 01/08/15  4:56 PM  Result Value Ref Range   Glucose-Capillary 129 (H) 65 - 99 mg/dL  Glucose, capillary     Status: Abnormal   Collection Time: 01/08/15  8:32 PM  Result Value Ref Range   Glucose-Capillary 126 (H) 65 - 99 mg/dL  Glucose, capillary     Status: Abnormal   Collection Time: 01/09/15  5:50 AM  Result Value Ref Range   Glucose-Capillary 106 (H) 65 - 99 mg/dL   Comment 1 Notify RN   Glucose, capillary     Status: Abnormal   Collection Time: 01/09/15 12:02 PM  Result Value Ref Range   Glucose-Capillary 104 (H) 65 - 99 mg/dL    Physical Findings: AIMS: Facial and Oral Movements Muscles of Facial Expression: None, normal Lips and Perioral Area: None, normal Jaw: None, normal Tongue: None, normal,Extremity Movements Upper (arms, wrists, hands, fingers): None, normal Lower (legs, knees, ankles, toes): None, normal, Trunk Movements Neck, shoulders, hips: None, normal, Overall Severity Severity of abnormal movements (highest score from questions above): None, normal Incapacitation due to abnormal movements: None, normal Patient's awareness of abnormal movements (rate only patient's report): No Awareness, Dental Status Current problems with teeth and/or dentures?: No Does patient usually wear dentures?: No  CIWA:    COWS:     Assessment:  Patient is a 41 y old AAM , who has a hx of schizophrenia , multiple medical problems ,  noncompliance with medications, relational struggles with family, well known to [facility], presents again with irritability, paranoia .  Patient remains very labile, irritable and disorganized.  He does not participate in the groups.  His his speech is pressured and incoherent.  He needs inpatient psychiatric treatment.    Treatment Plan Summary: Daily contact with patient to assess and evaluate symptoms and progress in treatment and Medication management Will continue Haldol 5 mg po daily for psychosis, mood sx. Will continue Cogentin 0.5 mg po daily for EPS. Will continue Trileptal 150 mg po bid for mood sx. Will continue Trazodone 125 mg po qhs for sleep-  Patient complained of back pain but did not elaborate any further symptoms.  Encouraged to take Tylenol if symptoms again appears. Will continue to monitor vitals ,medication compliance and treatment side effects while patient is here.   CSW will start working on disposition.Possible referral to ALF.   Medical Decision Making:  Review of Psycho-Social Stressors (1), Review of Last Therapy Session (1) and Review of Medication Regimen & Side Effects (2)     Ivan [name]. MD 01/09/2015, 12:56 PM

## 2015-01-09 NOTE — Progress Notes (Signed)
Patient transferred to [address] hall this afternoon.  Patient has been pleasant and cooperative.  Went to recreation activity this afternoon.  Patient denied SI and HI, contracts for safety.  Denied A/V hallucinations.  Denied pain.  Respirations even and unlabored.  No signs/symptoms of pain/distress noted on patient's face and body movements.  Safety maintained with 15 minute checks.

## 2015-01-09 NOTE — [facility] Group Notes (Signed)
[place] Group Notes:  (Clinical Social Work)  01/09/2015  [place] Group Notes:  (Clinical Social Work)  01/09/2015  11:00AM-12:00PM  Summary of Progress/Problems:  The main focus of today's process group was to listen to a variety of genres of music and to identify that different types of music provoke different responses.  The patient then was able to identify personally what was soothing for them, as well as energizing.  Handouts were used to record feelings evoked, as well as how patient can personally use this knowledge in sleep habits, with depression, and with other symptoms.  The patient was late to group and sat silently, did not participate for about the last half of group.  Type of Therapy:  Music Therapy   Participation Level:  None  Participation Quality:  Drowsy  Affect:  Blunted  Cognitive:  N/A  Insight:  Limited  Engagement in Therapy: None  Modes of Intervention:   Activity, Exploration  Selmer Dominion, LCSW 01/09/2015

## 2015-01-09 NOTE — Progress Notes (Signed)
Patient ID: Ivan [name], male   DOB: 07/10/1961, 54 y.o.   MRN: [id number] D: Client visible on the unit, sitting in the dayroom, no interaction noted. Client reports he's was admitted again "for the same thing" would not go into detail. Client reports no goals for today. A: Writer provided emotional support, encouraged client to report any concerns. Staff will monitor q65min for safety. R: client is safe on the unit, attended group.

## 2015-01-09 NOTE — BHH Group Notes (Signed)
[place] Group Notes:  (Nursing/MHT/Case Management/Adjunct)  Date:  01/09/2015  Time:  11:16 AM  Type of Therapy:  Psychoeducational Skills  Participation Level:  Did Not Attend  Participation Quality:  Did Not Attend  Affect:  Did Not Attend  Cognitive:  Did Not Attend  Insight:  None  Engagement in Group:  Did Not Attend  Modes of Intervention:  Did Not Attend  Summary of Progress/Problems: Pt did not attend patient self inventory group.   Benancio Deeds Shanta 01/09/2015, 11:16 AM

## 2015-01-10 DIAGNOSIS — F209 Schizophrenia, unspecified: Secondary | ICD-10-CM

## 2015-01-10 LAB — GLUCOSE, CAPILLARY
GLUCOSE-CAPILLARY: 204 mg/dL — AB (ref 65–99)
Glucose-Capillary: 109 mg/dL — ABNORMAL HIGH (ref 65–99)
Glucose-Capillary: 132 mg/dL — ABNORMAL HIGH (ref 65–99)
Glucose-Capillary: 145 mg/dL — ABNORMAL HIGH (ref 65–99)

## 2015-01-10 NOTE — Progress Notes (Signed)
D: Pt was seen in his room. States he is tired this morning. States he slept well last night and his appetite is good. States that his goal for today is "to work towards discharge." Pleasant and cooperative. A: Provide encouragement and support. Continue every 15 minute checks. Medications administered as ordered.  R: Denies any AVH. Denies SI/HI.

## 2015-01-10 NOTE — Clinical Social Work Note (Signed)
Awaiting Level 2 PASARR eval for ALF, screener assigned on 01/07/15.    Edwyna Shell, LCSW Clinical Social Worker

## 2015-01-10 NOTE — Progress Notes (Signed)
[facility] MD Progress Note  01/10/2015 2:59 PM Ivan [name]  MRN:  [id number] Subjective: Patient states " I am all right , every thing is fine.'    Objective: Patient seen and chart reviewed.  Patient presented after [facility] - for agitation , aggression. Pt had been noncompliant on his medications.  Patient seen and chart reviewed.Discussed patient with treatment team. I have reviewed notes in EHR from last two days. Patient is well known to Probation officer from his previous hospitalizations . Pt lacks insight in to his illness , stops taking his medications after DC. His sister is his legal guardian. Pt had an ACTT , however services dropped recently. Pt with no disruptive issues noted on the unit. Pt denies any SI/HI/AH/VH. Pt denies any new concerns . Pt encouraged to attend groups and take medictaions. Pt is well groomed today , more communicative than he was on admission. CSW will continue to work on placement - ALF /SNF.       Principal Problem: Schizophrenia, multiple episodes , currently in acute episode Diagnosis:   Patient Active Problem List   Diagnosis Date Noted  . Paranoid schizophrenia [F20.0]   . Schizophrenia [F20.9] 01/04/2015  . CKD (chronic kidney disease) stage 3, GFR 30-59 ml/min [N18.3]   . Pedal edema [R60.0]   . Chronic pain syndrome [G89.4]   . Essential hypertension [I10]   . Edema of both legs [R60.0] 04/07/2013  . Type I diabetes mellitus with complication, uncontrolled [E10.69] 04/01/2013   Total Time spent with patient: 20 minutes   Past Medical History:  Past Medical History  Diagnosis Date  . Diabetes mellitus   . Hypertension   . Bipolar 1 disorder   . Schizoaffective disorder   . Hypokalemia 04/02/2013  . Edema of both legs 04/07/2013  . Paranoid schizophrenia     Past Surgical History  Procedure Laterality Date  . Breast surgery    . Fracture surgery      ribs and arm   Family History:  Family History  Problem Relation Age of Onset  .  Depression Neg Hx    Social History:  History  Alcohol Use No     History  Drug Use No    History   Social History  . Marital Status: Divorced    Spouse Name: N/A  . Number of Children: N/A  . Years of Education: N/A   Social History Main Topics  . Smoking status: Current Every Day Smoker -- 0.50 packs/day for 39 years    Types: Cigarettes, Cigars  . Smokeless tobacco: Never Used  . Alcohol Use: No  . Drug Use: No  . Sexual Activity: No   Other Topics Concern  . None   Social History Narrative   Additional History:    Sleep: Fair  Appetite:  Fair     Musculoskeletal: Strength & Muscle Tone: within normal limits Gait & Station: normal Patient leans: N/A   Psychiatric Specialty Exam: Physical Exam  Review of Systems  Cardiovascular: Positive for leg swelling (CHRONIC).  All other systems reviewed and are negative.   Blood pressure 166/113, pulse 105, temperature 98.5 F (36.9 C), temperature source Oral, resp. rate 18, height 6' 2.5" (1.892 m), weight 117.482 kg (259 lb), SpO2 98 %.Body mass index is 32.82 kg/(m^2).  General Appearance: Fairly Groomed  Engineer, water::  Minimal  Speech:  Slow  Volume:  Normal  Mood:  Irritable IMPROVING  Affect:  Restricted  Thought Process:  Linear  Orientation:  Full (Time, Place,  and Person)  Thought Content:  Paranoid Ideation, PT IS GUARDED,SUSPICIOUS  Suicidal Thoughts:  No  Homicidal Thoughts:  No  Memory:  Immediate;   Fair Recent;   Fair Remote;   Fair  Judgement:  Impaired  Insight:  Shallow  Psychomotor Activity:  Normal  Concentration:  Fair  Recall:  Poor  Fund of Knowledge:Fair  Language: Poor  Akathisia:  No  Handed:  Right  AIMS (if indicated):     Assets:  Social Support  ADL's:  Intact  Cognition: WNL  Sleep:  Number of Hours: 4     Current Medications: Current Facility-Administered Medications  Medication Dose Route Frequency Provider Last Rate Last Dose  . acetaminophen (TYLENOL)  tablet 650 mg  650 mg Oral Q6H PRN Niel Hummer, NP      . alum & mag hydroxide-simeth (MAALOX/MYLANTA) 200-200-20 MG/5ML suspension 30 mL  30 mL Oral Q4H PRN Niel Hummer, NP      . amLODipine (NORVASC) tablet 5 mg  5 mg Oral BID Niel Hummer, NP   5 mg at 01/10/15 [id number]  . aspirin EC tablet 81 mg  81 mg Oral Daily Niel Hummer, NP   81 mg at 01/10/15 [id number]  . benztropine (COGENTIN) tablet 0.5 mg  0.5 mg Oral QPM Jahne Krukowski, MD   0.5 mg at 01/09/15 1719  . cloNIDine (CATAPRES) tablet 0.1 mg  0.1 mg Oral BID Niel Hummer, NP   0.1 mg at 01/10/15 [id number]  . furosemide (LASIX) tablet 40 mg  40 mg Oral BID Niel Hummer, NP   40 mg at 01/10/15 [id number]  . haloperidol (HALDOL) tablet 5 mg  5 mg Oral QPM Ursula Alert, MD   5 mg at 01/09/15 1658  . hydrALAZINE (APRESOLINE) tablet 100 mg  100 mg Oral TID Niel Hummer, NP   100 mg at 01/10/15 1213  . hydrOXYzine (ATARAX/VISTARIL) tablet 25 mg  25 mg Oral Q6H PRN Niel Hummer, NP   25 mg at 01/07/15 0034  . insulin aspart (novoLOG) injection 0-15 Units  0-15 Units Subcutaneous TID WC Ursula Alert, MD   2 Units at 01/10/15 1216  . linagliptin (TRADJENTA) tablet 5 mg  5 mg Oral Daily Niel Hummer, NP   5 mg at 01/10/15 [id number]  . magnesium hydroxide (MILK OF MAGNESIA) suspension 30 mL  30 mL Oral Daily PRN Niel Hummer, NP      . nicotine polacrilex (NICORETTE) gum 2 mg  2 mg Oral PRN Laverle Hobby, PA-C   2 mg at 01/10/15 0941  . OXcarbazepine (TRILEPTAL) tablet 150 mg  150 mg Oral BID Ursula Alert, MD   150 mg at 01/10/15 [id number]  . potassium chloride (K-DUR) CR tablet 10 mEq  10 mEq Oral BID Niel Hummer, NP   10 mEq at 01/10/15 [id number]  . traZODone (DESYREL) tablet 125 mg  125 mg Oral QHS Ursula Alert, MD   125 mg at 01/09/15 2257    Lab Results:  Results for orders placed or performed during the hospital encounter of 01/04/15 (from the past 48 hour(s))  Glucose, capillary     Status: Abnormal   Collection Time: 01/08/15  4:56 PM  Result Value  Ref Range   Glucose-Capillary 129 (H) 65 - 99 mg/dL  Glucose, capillary     Status: Abnormal   Collection Time: 01/08/15  8:32 PM  Result Value Ref Range   Glucose-Capillary 126 (H) 65 - 99 mg/dL  Glucose,  capillary     Status: Abnormal   Collection Time: 01/09/15  5:50 AM  Result Value Ref Range   Glucose-Capillary 106 (H) 65 - 99 mg/dL   Comment 1 Notify RN   Glucose, capillary     Status: Abnormal   Collection Time: 01/09/15 12:02 PM  Result Value Ref Range   Glucose-Capillary 104 (H) 65 - 99 mg/dL  Glucose, capillary     Status: Abnormal   Collection Time: 01/09/15  4:38 PM  Result Value Ref Range   Glucose-Capillary 177 (H) 65 - 99 mg/dL   Comment 1 Notify RN    Comment 2 Document in Chart   Glucose, capillary     Status: Abnormal   Collection Time: 01/09/15  8:46 PM  Result Value Ref Range   Glucose-Capillary 144 (H) 65 - 99 mg/dL  Glucose, capillary     Status: Abnormal   Collection Time: 01/10/15  5:54 AM  Result Value Ref Range   Glucose-Capillary 109 (H) 65 - 99 mg/dL  Glucose, capillary     Status: Abnormal   Collection Time: 01/10/15 12:06 PM  Result Value Ref Range   Glucose-Capillary 132 (H) 65 - 99 mg/dL   Comment 1 Notify RN    Comment 2 Document in Chart     Physical Findings: AIMS: Facial and Oral Movements Muscles of Facial Expression: None, normal Lips and Perioral Area: None, normal Jaw: None, normal Tongue: None, normal,Extremity Movements Upper (arms, wrists, hands, fingers): None, normal Lower (legs, knees, ankles, toes): None, normal, Trunk Movements Neck, shoulders, hips: None, normal, Overall Severity Severity of abnormal movements (highest score from questions above): None, normal Incapacitation due to abnormal movements: None, normal Patient's awareness of abnormal movements (rate only patient's report): No Awareness, Dental Status Current problems with teeth and/or dentures?: No Does patient usually wear dentures?: No  CIWA:    COWS:      Assessment:  Patient is a 89 y old AAM , who has a hx of schizophrenia , multiple medical problems , noncompliance with medications, relational struggles with family, well known to [facility], presented again with irritability, paranoia .  Patient is better groomed today , calm, less irritable than admission. CSW will work on ALF placement. Will continue treatment.  Sister - Jamason Landis - is legal guardian.      Treatment Plan Summary: Daily contact with patient to assess and evaluate symptoms and progress in treatment and Medication management Will continue Haldol 5 mg po daily for psychosis, mood sx. AIMS - 0 (01/10/15) Will continue Cogentin 0.5 mg po daily for EPS. Will continue Trileptal 150 mg po bid for mood sx. Will continue Trazodone 125 mg po qhs for sleep-  Pain management with Tylenol 650 mg po prn. Continue Clonidine 0.1 mg po bid, Lasix 40 mg po bid , Norvasc 5 mg po bid for HTN. Tradjenta 5 mg po daily as well as Insulin as scheduled for DM.   Will continue to monitor vitals ,medication compliance and treatment side effects while patient is here.   CSW will start working on disposition- REFERRAL TO ALF.   Medical Decision Making:  Established Problem, Stable/Improving (1), Review of Psycho-Social Stressors (1), Review of Last Therapy Session (1) and Review of Medication Regimen & Side Effects (2)     Kitty Cadavid MD 01/10/2015, 2:59 PM

## 2015-01-10 NOTE — Progress Notes (Signed)
Patient ID: Ivan [name], male   DOB: [birth year], 54 y.o.   MRN: [id number] D: Client visible on the unit, walking the hall, then in dayroom, but isolates. Client reports he does not go to group, "I do my own group" says the focus of his own group is "stay out of trouble with people" A: Writer provided emotional support, encouraged him to participate in his care and report concerns. Staff will monitor q52min for safety. R: Client is safe on the unit, did not attend groups.

## 2015-01-10 NOTE — Progress Notes (Signed)
Recreation Therapy Notes  Date: 01/10/15 Time: 9:30am Location: [address] Dayroom  Group Topic: Stress Management  Goal Area(s) Addresses:  Patient will verbalize importance of using healthy stress management.  Patient will identify positive emotions associated with healthy stress management.   Intervention: Stress Management  Activity :  Guided Imagery.  LRT introduced and educated patients on stress management technique of guided imagery.  Scripts were used to deliver the technique to patients, patients were asked to follow script read allowed by LRT to engage in practicing the stress management technique.  Education:  Stress Management, Discharge Planning.   Education Outcome: Needs additional education  Clinical Observations/Feedback:  Patient did not attend.   [name], LRT/CTRS         [name] A 01/10/2015 3:54 PM

## 2015-01-11 LAB — GLUCOSE, CAPILLARY
GLUCOSE-CAPILLARY: 114 mg/dL — AB (ref 65–99)
GLUCOSE-CAPILLARY: 127 mg/dL — AB (ref 65–99)
Glucose-Capillary: 108 mg/dL — ABNORMAL HIGH (ref 65–99)
Glucose-Capillary: 147 mg/dL — ABNORMAL HIGH (ref 65–99)

## 2015-01-11 NOTE — Plan of Care (Signed)
Problem: Alteration in thought process Goal: LTG-Patient behavior demonstrates decreased signs psychosis Patient denies AVH; however remains irritable, withdrawn, limited communication, depressed affect. Goal progressing. Edwyna Shell, LCSW 01/07/2015 10:14 A Outcome: Progressing Pt denies A/VH and has assertive interactions with staff and peers.

## 2015-01-11 NOTE — [facility] Group Notes (Signed)
[place] LCSW Group Therapy 01/11/2015  1:15 PM   Type of Therapy: Group Therapy  Participation Level: Did Not Attend. Patient invited to participate but declined.   Tilden Fossa, MSW, [place] Worker [facility] [phone]

## 2015-01-11 NOTE — Progress Notes (Signed)
Recreation Therapy Notes  Animal-Assisted Activity (AAA) Program Checklist/Progress Notes Patient Eligibility Criteria Checklist & Daily Group note for Rec Tx Intervention  Date: 01/11/15 Time: 2:30pm Location: [address]   AAA/T Program Assumption of Risk Form signed by Patient/ or Parent Legal Guardian yes  Patient is free of allergies or sever asthma yes  Patient reports no fear of animals yes  Patient reports no history of cruelty to animalsyes  Patient understands his/her participation is voluntary yes  Patient washes hands before animal contact yes  Patient washes hands after animal contact yes  Education: Hand Washing, Appropriate Animal Interaction   Education Outcome: Acknowledges understanding/In group clarification offered/Needs additional education.   Clinical Observations/Feedback: Patient did not attend group.   [name], LRT/CTRS         [name] A 01/11/2015 4:33 PM

## 2015-01-11 NOTE — Progress Notes (Signed)
Patient ID: Ivan [name], male   DOB: 02/02/1961, 54 y.o.   MRN: [id number]  Pt currently presents with a flat affect and cooperative behavior. Pt forwards little during interaction with pt. Pt states "I dunno, there are some things in the works." When talking about his plans for discharge.  Pt provided with medications per providers orders. Pt's labs and vitals were monitored throughout the day. Pt supported emotionally and encouraged to express concerns and questions. Pt educated on medications. Pt glucose levels monitored and insulin held or dispensed per orders. SW/doctor consulted.  Pt's safety ensured with 15 minute and environmental checks. Pt currently denies SI/HI and A/V hallucinations. Pt verbally agrees to seek staff if SI/HI or A/VH occurs and to consult with staff before acting on these thoughts. Pt to ALF post-discharge.

## 2015-01-11 NOTE — Progress Notes (Addendum)
[facility] MD Progress Note  01/11/2015 1:51 PM Ivan [name]  MRN:  [id number] Subjective: Patient states " I am all right , I guess I have to go where ever I have to.'    Objective: Patient seen and chart reviewed.  Patient presented after [facility] - for agitation , aggression. Pt had been noncompliant on his medications.  Patient seen and chart reviewed.Discussed patient with treatment team. I have reviewed notes in EHR from last two days. Patient is well known to Probation officer from his previous hospitalizations . Pt continues to be a limited participant when it comes to daily assessments as well as group activities. This has been patient's baseline during his previous admissions too. Pt today denies any new concerns . Pt has been compliant with medications per staff.  No disruptive issues noted on the unit. CSW will continue to work on placement - ALF /SNF.       Principal Problem: Schizophrenia, multiple episodes , currently in acute episode Diagnosis:   Patient Active Problem List   Diagnosis Date Noted  . Paranoid schizophrenia [F20.0]   . Schizophrenia [F20.9] 01/04/2015  . CKD (chronic kidney disease) stage 3, GFR 30-59 ml/min [N18.3]   . Pedal edema [R60.0]   . Chronic pain syndrome [G89.4]   . Essential hypertension [I10]   . Edema of both legs [R60.0] 04/07/2013  . Type I diabetes mellitus with complication, uncontrolled [E10.69] 04/01/2013   Total Time spent with patient: 30 minutes   Past Medical History:  Past Medical History  Diagnosis Date  . Diabetes mellitus   . Hypertension   . Bipolar 1 disorder   . Schizoaffective disorder   . Hypokalemia 04/02/2013  . Edema of both legs 04/07/2013  . Paranoid schizophrenia     Past Surgical History  Procedure Laterality Date  . Breast surgery    . Fracture surgery      ribs and arm   Family History:  Family History  Problem Relation Age of Onset  . Depression Neg Hx    Social History:  History  Alcohol Use No      History  Drug Use No    History   Social History  . Marital Status: Divorced    Spouse Name: N/A  . Number of Children: N/A  . Years of Education: N/A   Social History Main Topics  . Smoking status: Current Every Day Smoker -- 0.50 packs/day for 39 years    Types: Cigarettes, Cigars  . Smokeless tobacco: Never Used  . Alcohol Use: No  . Drug Use: No  . Sexual Activity: No   Other Topics Concern  . None   Social History Narrative   Additional History:    Sleep: Fair  Appetite:  Fair     Musculoskeletal: Strength & Muscle Tone: within normal limits Gait & Station: normal Patient leans: N/A   Psychiatric Specialty Exam: Physical Exam  Review of Systems  Cardiovascular: Positive for leg swelling (stable).  All other systems reviewed and are negative.   Blood pressure 125/70, pulse 79, temperature 98.1 F (36.7 C), temperature source Oral, resp. rate 19, height 6' 2.5" (1.892 m), weight 117.482 kg (259 lb), SpO2 98 %.Body mass index is 32.82 kg/(m^2).  General Appearance: Fairly Groomed  Engineer, water::  Minimal  Speech:  Slow  Volume:  Normal  Mood:  Irritable IMPROVING  Affect:  Restricted  Thought Process:  Linear  Orientation:  Full (Time, Place, and Person)  Thought Content:  Paranoid Ideation, PT IS GUARDED,SUSPICIOUS  Suicidal Thoughts:  No  Homicidal Thoughts:  No  Memory:  Immediate;   Fair Recent;   Fair Remote;   Fair  Judgement:  Impaired  Insight:  Shallow  Psychomotor Activity:  Normal  Concentration:  Fair  Recall:  Poor  Fund of Knowledge:Fair  Language: Poor  Akathisia:  No  Handed:  Right  AIMS (if indicated):     Assets:  Social Support  ADL's:  Intact  Cognition: WNL  Sleep:  Number of Hours: 5.25     Current Medications: Current Facility-Administered Medications  Medication Dose Route Frequency Provider Last Rate Last Dose  . acetaminophen (TYLENOL) tablet 650 mg  650 mg Oral Q6H PRN Niel Hummer, NP      . alum & mag  hydroxide-simeth (MAALOX/MYLANTA) 200-200-20 MG/5ML suspension 30 mL  30 mL Oral Q4H PRN Niel Hummer, NP      . amLODipine (NORVASC) tablet 5 mg  5 mg Oral BID Niel Hummer, NP   5 mg at 01/11/15 0836  . aspirin EC tablet 81 mg  81 mg Oral Daily Niel Hummer, NP   81 mg at 01/11/15 0836  . benztropine (COGENTIN) tablet 0.5 mg  0.5 mg Oral QPM Makai Dumond, MD   0.5 mg at 01/10/15 1721  . cloNIDine (CATAPRES) tablet 0.1 mg  0.1 mg Oral BID Niel Hummer, NP   0.1 mg at 01/11/15 [id number]  . furosemide (LASIX) tablet 40 mg  40 mg Oral BID Niel Hummer, NP   40 mg at 01/11/15 [id number]  . haloperidol (HALDOL) tablet 5 mg  5 mg Oral QPM Akaiya Touchette, MD   5 mg at 01/10/15 1720  . hydrALAZINE (APRESOLINE) tablet 100 mg  100 mg Oral TID Niel Hummer, NP   100 mg at 01/11/15 1212  . hydrOXYzine (ATARAX/VISTARIL) tablet 25 mg  25 mg Oral Q6H PRN Niel Hummer, NP   25 mg at 01/07/15 0034  . insulin aspart (novoLOG) injection 0-15 Units  0-15 Units Subcutaneous TID WC Ursula Alert, MD   Stopped at 01/11/15 1213  . linagliptin (TRADJENTA) tablet 5 mg  5 mg Oral Daily Niel Hummer, NP   5 mg at 01/11/15 0836  . magnesium hydroxide (MILK OF MAGNESIA) suspension 30 mL  30 mL Oral Daily PRN Niel Hummer, NP      . nicotine polacrilex (NICORETTE) gum 2 mg  2 mg Oral PRN Laverle Hobby, PA-C   2 mg at 01/11/15 1253  . OXcarbazepine (TRILEPTAL) tablet 150 mg  150 mg Oral BID Ursula Alert, MD   150 mg at 01/11/15 0837  . potassium chloride (K-DUR) CR tablet 10 mEq  10 mEq Oral BID Niel Hummer, NP   10 mEq at 01/11/15 0837  . traZODone (DESYREL) tablet 125 mg  125 mg Oral QHS Ursula Alert, MD   125 mg at 01/10/15 2125    Lab Results:  Results for orders placed or performed during the hospital encounter of 01/04/15 (from the past 48 hour(s))  Glucose, capillary     Status: Abnormal   Collection Time: 01/09/15  4:38 PM  Result Value Ref Range   Glucose-Capillary 177 (H) 65 - 99 mg/dL   Comment 1 Notify  RN    Comment 2 Document in Chart   Glucose, capillary     Status: Abnormal   Collection Time: 01/09/15  8:46 PM  Result Value Ref Range   Glucose-Capillary 144 (H) 65 - 99 mg/dL  Glucose, capillary     Status: Abnormal   Collection Time: 01/10/15  5:54 AM  Result Value Ref Range   Glucose-Capillary 109 (H) 65 - 99 mg/dL  Glucose, capillary     Status: Abnormal   Collection Time: 01/10/15 12:06 PM  Result Value Ref Range   Glucose-Capillary 132 (H) 65 - 99 mg/dL   Comment 1 Notify RN    Comment 2 Document in Chart   Glucose, capillary     Status: Abnormal   Collection Time: 01/10/15  4:38 PM  Result Value Ref Range   Glucose-Capillary 145 (H) 65 - 99 mg/dL   Comment 1 Notify RN    Comment 2 Document in Chart   Glucose, capillary     Status: Abnormal   Collection Time: 01/10/15  8:51 PM  Result Value Ref Range   Glucose-Capillary 204 (H) 65 - 99 mg/dL   Comment 1 Notify RN   Glucose, capillary     Status: Abnormal   Collection Time: 01/11/15  6:14 AM  Result Value Ref Range   Glucose-Capillary 108 (H) 65 - 99 mg/dL  Glucose, capillary     Status: Abnormal   Collection Time: 01/11/15 12:04 PM  Result Value Ref Range   Glucose-Capillary 114 (H) 65 - 99 mg/dL    Physical Findings: AIMS: Facial and Oral Movements Muscles of Facial Expression: None, normal Lips and Perioral Area: None, normal Jaw: None, normal Tongue: None, normal,Extremity Movements Upper (arms, wrists, hands, fingers): None, normal Lower (legs, knees, ankles, toes): None, normal, Trunk Movements Neck, shoulders, hips: None, normal, Overall Severity Severity of abnormal movements (highest score from questions above): None, normal Incapacitation due to abnormal movements: None, normal Patient's awareness of abnormal movements (rate only patient's report): No Awareness, Dental Status Current problems with teeth and/or dentures?: No Does patient usually wear dentures?: No  CIWA:    COWS:     Assessment:   Patient is a 38 y old AAM , who has a hx of schizophrenia , multiple medical problems , noncompliance with medications, relational struggles with family, well known to [facility], presented again with irritability, paranoia .  Patient with improved grooming , answers questions in short answers. CSW will work on ALF placement. Will continue treatment.  Sister - Damyan Cost - is legal guardian.      Treatment Plan Summary: Daily contact with patient to assess and evaluate symptoms and progress in treatment and Medication management Will continue Haldol 5 mg po daily for psychosis, mood sx. AIMS - 0 (01/10/15) Will continue Cogentin 0.5 mg po daily for EPS. Will continue Trileptal 150 mg po bid for mood sx. Will continue Trazodone 125 mg po qhs for sleep-  Pain management with Tylenol 650 mg po prn. Continue Clonidine 0.1 mg po bid, Lasix 40 mg po bid , Norvasc 5 mg po bid for HTN. Tradjenta 5 mg po daily as well as Insulin as scheduled for DM.   Will continue to monitor vitals ,medication compliance and treatment side effects while patient is here.   CSW will start working on disposition- REFERRAL TO ALF.   Medical Decision Making:  Established Problem, Stable/Improving (1), Review of Psycho-Social Stressors (1), Review of Last Therapy Session (1) and Review of Medication Regimen & Side Effects (2)     Carmela Piechowski MD 01/11/2015, 1:51 PM

## 2015-01-11 NOTE — Clinical Social Work Note (Signed)
CSW spoke w D Shima, states that if outpatient commitment is in place, ACT team is on board, care coordination is active and patient has appointments in place within 7 days of discharge, family is agreeable to patient returning to prior living arrangement.  Understands that patient is at risk for leaving ALF as they are not locked facilities.  Wants patient to understand that he will lose his housing option if he refuses to comply w treatment.    Edwyna Shell, LCSW Clinical Social Worker

## 2015-01-11 NOTE — Tx Team (Signed)
Interdisciplinary Treatment Plan Update (Adult)  Date:  01/11/2015   Time Reviewed: 9:30  AM  Progress in Treatment: Attending groups: Yes. Participating in groups:  Yes. Taking medication as prescribed:  Yes. Tolerating medication:  Yes. Family/Significant othe contact made:  CSW spoke w guardian, Ivan [name] Patient understands diagnosis:  No, patient has limited insight into illness Discussing patient identified problems/goals with staff:  Yes, see initial care plan. Medical problems stabilized or resolved:  Yes. Developed elevated BP, MD monitoring, reports pain at 8/10 Denies suicidal/homicidal ideation: Yes. Issues/concerns per patient self-inventory:  No. Other:  New problem(s) identified:  Per sister, patient cannot return to current home.  Feels he needs more services to support in community.  Has resisted care in community  Discharge Plan or Barriers:  Outpatient lack of adherence to treatment, significant physical health issues exacerbated by mental illness and resultant isolation.  Reason for Continuation of Hospitalization: Medication stabilization  Comments: Ivan [name] is an 54 y.o. male brought to [facility] ED by EMS after he was found yelling in his front yard. He told the ED staff that he did not want to harm himself or others, but hat he was not happy where he was. UPon assessment he presents as somewhat disheveled with fair eye contact and loud speech. His mood was angry and his affect was congruent. He said, "Just send me to [facility]. That's the best place I could go. They have good doctors there."   01/07/15:  Patient appears to be more irritable, withdrawn, remains in bed in the AM.  Sister stating that patient has not been successful at home, has multiple medical issues in addition to mental health disorder which patient does not acknowledge.  Patient has said he wants placement out of [facility], guardian completing paperwork for placement.  Care  coordinator requested from [facility]  Patient remains irritable, presents w depressed affect and mood, currently has elevated BP, MD aware.  Medications include trazodone, trileptal, Haldol, cogentin.  Multiple medications for treatment of diabetes, hypertension, CKD.   Estimated length of stay:  2-3 days  New goal(s):  Medication stabilization, development of plan of care for outpatient services, referral for possible out of home placement.    Review of initial/current patient goals per problem list:   See initial plan of care  Attendees: Patient:    Family:    Physician: Dr. Parke Poisson; Dr. Sabra Heck; Dr. Shea Evans 01/11/2015 9:30 AM  Nursing: Larinda Buttery, Janann August, RN 01/11/2015 9:30 AM  Clinical Social Worker: Erasmo Downer Casy Brunetto,  [place] 01/11/2015 9:30 AM  Other: Peri Maris, LCSWA  01/11/2015 9:30 AM  Other: Lucinda Dell, Beverly Sessions Liaison 01/11/2015 9:30 AM  Other: Lars Pinks, Case Manager 01/11/2015 9:30 AM  Other: Agustina Caroli, May Augustin, NP 01/11/2015 9:30 AM  Other:    Other:    Other:    Other:    Other:      Tilden Fossa, MSW, [place] Worker [facility] [phone]

## 2015-01-11 NOTE — Progress Notes (Signed)
Adult Psychoeducational Group Note  Date:  01/11/2015 Time:  0900  Group Topic/Focus:  Orientation:   The focus of this group is to educate the patient on the purpose and policies of crisis stabilization and provide a format to answer questions about their admission.  The group details unit policies and expectations of patients while admitted.  Participation Level:  Active  Participation Quality:  Appropriate  Affect:  Appropriate  Cognitive:  Appropriate  Insight: Appropriate  Engagement in Group:  Engaged  Modes of Intervention:  Education  Additional Comments:    Jahfari Ambers L 01/11/2015, 10:13 AM

## 2015-01-11 NOTE — Progress Notes (Signed)
Patient ID: Ivan [name], male   DOB: Feb 05, 1961, 54 y.o.   MRN: [id number] D: Client is visible on the unit, exclaims "be up and about every day." Client in minimal in conversation, no complaints. A: Writer encouraged client to report any concerns. Medications reviewed and administered as ordered. Staff to continue q29min safety checks. R: Client is safe on the unit, attended group.

## 2015-01-11 NOTE — Clinical Social Work Note (Signed)
Patient issued PASARR for ALF - [id number] [name], LCSW Clinical Social Worker

## 2015-01-12 LAB — GLUCOSE, CAPILLARY
GLUCOSE-CAPILLARY: 124 mg/dL — AB (ref 65–99)
Glucose-Capillary: 112 mg/dL — ABNORMAL HIGH (ref 65–99)
Glucose-Capillary: 131 mg/dL — ABNORMAL HIGH (ref 65–99)
Glucose-Capillary: 164 mg/dL — ABNORMAL HIGH (ref 65–99)

## 2015-01-12 NOTE — Progress Notes (Signed)
Adult Psychoeducational Group Note  Date:  01/12/2015 Time:  9:24 PM  Group Topic/Focus:  Wrap-Up Group:   The focus of this group is to help patients review their daily goal of treatment and discuss progress on daily workbooks.  Participation Level:  Active  Participation Quality:  Appropriate  Affect:  Appropriate  Cognitive:  Appropriate  Insight: Appropriate  Engagement in Group:  Engaged  Modes of Intervention:  Discussion  Additional Comments:  Pt stated he had a good day and that he is working on discharge plans.  Clint Bolder 01/12/2015, 9:24 PM

## 2015-01-12 NOTE — Progress Notes (Signed)
[facility] MD Progress Note  01/12/2015 12:53 PM Ivan [name]  MRN:  [id number] Subjective: Patient states " I am all right "    Objective: Patient seen and chart reviewed.  Patient presented after [facility] - for agitation , aggression. Pt had been noncompliant on his medications.  Patient seen and chart reviewed.Discussed patient with treatment team. I have reviewed notes in EHR from last two days. Patient is well known to Probation officer from his previous hospitalizations . Pt continues to be a limited participant when it comes to daily assessments as well as group activities. This has been patient's baseline during his previous admissions too. Pt today with no new complaints . Pt states "no ' to most questions asked , states ' I am all right " .  Pt has been compliant with medications per staff.  No disruptive issues noted on the unit.        Principal Problem: Schizophrenia, multiple episodes , currently in acute episode Diagnosis:   Patient Active Problem List   Diagnosis Date Noted  . Paranoid schizophrenia [F20.0]   . Schizophrenia [F20.9] 01/04/2015  . CKD (chronic kidney disease) stage 3, GFR 30-59 ml/min [N18.3]   . Pedal edema [R60.0]   . Chronic pain syndrome [G89.4]   . Essential hypertension [I10]   . Edema of both legs [R60.0] 04/07/2013  . Type I diabetes mellitus with complication, uncontrolled [E10.69] 04/01/2013   Total Time spent with patient: 30 minutes   Past Medical History:  Past Medical History  Diagnosis Date  . Diabetes mellitus   . Hypertension   . Bipolar 1 disorder   . Schizoaffective disorder   . Hypokalemia 04/02/2013  . Edema of both legs 04/07/2013  . Paranoid schizophrenia     Past Surgical History  Procedure Laterality Date  . Breast surgery    . Fracture surgery      ribs and arm   Family History:  Family History  Problem Relation Age of Onset  . Depression Neg Hx    Social History:  History  Alcohol Use No     History  Drug Use No     History   Social History  . Marital Status: Divorced    Spouse Name: N/A  . Number of Children: N/A  . Years of Education: N/A   Social History Main Topics  . Smoking status: Current Every Day Smoker -- 0.50 packs/day for 39 years    Types: Cigarettes, Cigars  . Smokeless tobacco: Never Used  . Alcohol Use: No  . Drug Use: No  . Sexual Activity: No   Other Topics Concern  . None   Social History Narrative   Additional History:    Sleep: Fair  Appetite:  Fair     Musculoskeletal: Strength & Muscle Tone: within normal limits Gait & Station: normal Patient leans: N/A   Psychiatric Specialty Exam: Physical Exam  Review of Systems  Cardiovascular: Positive for leg swelling (chronic, stable).  All other systems reviewed and are negative.   Blood pressure 123/89, pulse 93, temperature 97.6 F (36.4 C), temperature source Oral, resp. rate 18, height 6' 2.5" (1.892 m), weight 117.482 kg (259 lb), SpO2 98 %.Body mass index is 32.82 kg/(m^2).  General Appearance: Fairly Groomed  Engineer, water::  Fair  Speech:  Normal Rate  Volume:  Normal  Mood:  Irritable IMPROVING  Affect:  Restricted  Thought Process:  Linear  Orientation:  Full (Time, Place, and Person)  Thought Content:  Paranoid Ideation, PT IS GUARDED,SUSPICIOUS-  which is chronic  Suicidal Thoughts:  No  Homicidal Thoughts:  No  Memory:  Immediate;   Fair Recent;   Fair Remote;   Fair  Judgement:  Impaired  Insight:  Shallow  Psychomotor Activity:  Normal  Concentration:  Fair  Recall:  [employer] of Knowledge:Fair  Language: Poor  Akathisia:  No  Handed:  Right  AIMS (if indicated):     Assets:  Social Support  ADL's:  Intact  Cognition: WNL  Sleep:  Number of Hours: 4.5     Current Medications: Current Facility-Administered Medications  Medication Dose Route Frequency Provider Last Rate Last Dose  . acetaminophen (TYLENOL) tablet 650 mg  650 mg Oral Q6H PRN Niel Hummer, NP      . alum &  mag hydroxide-simeth (MAALOX/MYLANTA) 200-200-20 MG/5ML suspension 30 mL  30 mL Oral Q4H PRN Niel Hummer, NP      . amLODipine (NORVASC) tablet 5 mg  5 mg Oral BID Niel Hummer, NP   5 mg at 01/12/15 [id number]  . aspirin EC tablet 81 mg  81 mg Oral Daily Niel Hummer, NP   81 mg at 01/12/15 [id number]  . benztropine (COGENTIN) tablet 0.5 mg  0.5 mg Oral QPM Chirsty Armistead, MD   0.5 mg at 01/11/15 1805  . cloNIDine (CATAPRES) tablet 0.1 mg  0.1 mg Oral BID Niel Hummer, NP   0.1 mg at 01/12/15 [id number]  . furosemide (LASIX) tablet 40 mg  40 mg Oral BID Niel Hummer, NP   40 mg at 01/12/15 [id number]  . haloperidol (HALDOL) tablet 5 mg  5 mg Oral QPM Ursula Alert, MD   5 mg at 01/11/15 1805  . hydrALAZINE (APRESOLINE) tablet 100 mg  100 mg Oral TID Niel Hummer, NP   100 mg at 01/12/15 1208  . hydrOXYzine (ATARAX/VISTARIL) tablet 25 mg  25 mg Oral Q6H PRN Niel Hummer, NP   25 mg at 01/07/15 0034  . insulin aspart (novoLOG) injection 0-15 Units  0-15 Units Subcutaneous TID WC Ursula Alert, MD   2 Units at 01/12/15 1208  . linagliptin (TRADJENTA) tablet 5 mg  5 mg Oral Daily Niel Hummer, NP   5 mg at 01/12/15 [id number]  . magnesium hydroxide (MILK OF MAGNESIA) suspension 30 mL  30 mL Oral Daily PRN Niel Hummer, NP      . nicotine polacrilex (NICORETTE) gum 2 mg  2 mg Oral PRN Laverle Hobby, PA-C   2 mg at 01/12/15 1104  . OXcarbazepine (TRILEPTAL) tablet 150 mg  150 mg Oral BID Ursula Alert, MD   150 mg at 01/12/15 0842  . potassium chloride (K-DUR) CR tablet 10 mEq  10 mEq Oral BID Niel Hummer, NP   10 mEq at 01/12/15 [id number]  . traZODone (DESYREL) tablet 125 mg  125 mg Oral QHS Ursula Alert, MD   125 mg at 01/11/15 2202    Lab Results:  Results for orders placed or performed during the hospital encounter of 01/04/15 (from the past 48 hour(s))  Glucose, capillary     Status: Abnormal   Collection Time: 01/10/15  4:38 PM  Result Value Ref Range   Glucose-Capillary 145 (H) 65 - 99 mg/dL   Comment 1  Notify RN    Comment 2 Document in Chart   Glucose, capillary     Status: Abnormal   Collection Time: 01/10/15  8:51 PM  Result Value Ref Range   Glucose-Capillary 204 (  H) 65 - 99 mg/dL   Comment 1 Notify RN   Glucose, capillary     Status: Abnormal   Collection Time: 01/11/15  6:14 AM  Result Value Ref Range   Glucose-Capillary 108 (H) 65 - 99 mg/dL  Glucose, capillary     Status: Abnormal   Collection Time: 01/11/15 12:04 PM  Result Value Ref Range   Glucose-Capillary 114 (H) 65 - 99 mg/dL  Glucose, capillary     Status: Abnormal   Collection Time: 01/11/15  4:44 PM  Result Value Ref Range   Glucose-Capillary 147 (H) 65 - 99 mg/dL  Glucose, capillary     Status: Abnormal   Collection Time: 01/11/15  8:26 PM  Result Value Ref Range   Glucose-Capillary 127 (H) 65 - 99 mg/dL  Glucose, capillary     Status: Abnormal   Collection Time: 01/12/15  5:39 AM  Result Value Ref Range   Glucose-Capillary 112 (H) 65 - 99 mg/dL  Glucose, capillary     Status: Abnormal   Collection Time: 01/12/15 11:57 AM  Result Value Ref Range   Glucose-Capillary 131 (H) 65 - 99 mg/dL    Physical Findings: AIMS: Facial and Oral Movements Muscles of Facial Expression: None, normal Lips and Perioral Area: None, normal Jaw: None, normal Tongue: None, normal,Extremity Movements Upper (arms, wrists, hands, fingers): None, normal Lower (legs, knees, ankles, toes): None, normal, Trunk Movements Neck, shoulders, hips: None, normal, Overall Severity Severity of abnormal movements (highest score from questions above): None, normal Incapacitation due to abnormal movements: None, normal Patient's awareness of abnormal movements (rate only patient's report): No Awareness, Dental Status Current problems with teeth and/or dentures?: No Does patient usually wear dentures?: No  CIWA:    COWS:     Assessment:  Patient is a 26 y old AAM , who has a hx of schizophrenia , multiple medical problems , noncompliance  with medications, relational struggles with family, well known to [facility], presented again with irritability, paranoia .  Patient is alert, oriented x 4. Pt is compliant on medications. CSW working on placement. Will continue treatment.  Sister - Mahamed Lukose - is legal guardian.      Treatment Plan Summary: Daily contact with patient to assess and evaluate symptoms and progress in treatment and Medication management Will continue Haldol 5 mg po daily for psychosis, mood sx. AIMS - 0 (01/10/15) Will continue Cogentin 0.5 mg po daily for EPS. Will continue Trileptal 150 mg po bid for mood sx. Will continue Trazodone 125 mg po qhs for sleep-  Pain management with Tylenol 650 mg po prn. Continue Clonidine 0.1 mg po bid, Lasix 40 mg po bid , Norvasc 5 mg po bid for HTN. Tradjenta 5 mg po daily as well as Insulin as scheduled for DM.   Will continue to monitor vitals ,medication compliance and treatment side effects while patient is here.   CSW will start working on disposition-per [place] notes: 01/12/15 As patient is nearing discharge, CSW asked if guardian wanted [facility] to look for ALF placement. Guardian declined due to concerns about patient wandering in unfamiliar territory. CSW advised that patient present for ACT Team assessment at [facility] crisis eval immediately upon discharge. Guardian agreeable, understands that he will owe for these services under Medicare until IPRS is approved. Guardian also asked if venous doppler study had been done, MD advised. Guardian wants done prior to dc if possible."  Discussed with CSW - to obtain consent from legal guardian for the  venous doppler study. Pt had one study LE -left - done in 06/2014 - was negative.   Medical Decision Making:  Established Problem, Stable/Improving (1), Review of Psycho-Social Stressors (1), Review of Last Therapy Session (1) and Review of Medication Regimen & Side Effects (2)     Katrese Shell  MD 01/12/2015, 12:53 PM

## 2015-01-12 NOTE — BHH Group Notes (Signed)
   [facility] LCSW Aftercare Discharge Planning Group Note  01/12/2015  8:45 AM   Participation Quality: Alert, Appropriate and Oriented  Mood/Affect: Depressed and Flat  Depression Rating: 1  Anxiety Rating: 0  Thoughts of Suicide: Pt denies SI/HI  Will you contract for safety? Yes  Current AVH: Pt denies  Plan for Discharge/Comments: Pt attended discharge planning group and actively participated in group. CSW provided pt with today's workbook. Patient reports continued interest in ALF placement.   Transportation Means: Pt reports access to transportation  Supports: No supports mentioned at this time  Tilden Fossa, MSW, [place] Social Worker [employer] [phone]

## 2015-01-12 NOTE — Progress Notes (Signed)
Recreation Therapy Notes  Date: 01/12/15 Time: 9:30am Location: [address] Group Room  Group Topic: Stress Management  Goal Area(s) Addresses:  Patient will verbalize importance of using healthy stress management.  Patient will identify positive emotions associated with healthy stress management.   Intervention: Progressive Muscle Relaxation Script  Activity :  Patient will listen as LRT leads them through progressive muscle relaxation.  Patients will be lead through each muscle group from head to toe.  Education:  Stress Management, Discharge Planning.   Education Outcome: Acknowledges edcuation/In group clarification offered/Needs additional education  Clinical Observations/Feedback: Patient did not attend.  [name], LRT/CTRS         [name] A 01/12/2015 1:46 PM

## 2015-01-12 NOTE — Progress Notes (Signed)
Patient ID: Ivan [name], male   DOB: 13-Oct-1960, 54 y.o.   MRN: [id number]   Pt currently presents with a flat affect and cooperative behavior. Pt forwards little but it a little less guarded with Probation officer today. Per self inventory, pt rates depression, hopelessness and anxiety at a 0. Pt's daily goal is "getting out" Pt reports good sleep, good concentration and a good appetite. Pt L eye increasingly red.   Pt provided with medications per providers orders. Pt's labs and vitals were monitored throughout the day. Pt supported emotionally and encouraged to express concerns and questions. Pt educated on medications.  Pt's safety ensured with 15 minute and environmental checks. Pt currently denies SI/HI and A/V hallucinations. Pt verbally agrees to seek staff if SI/HI or A/VH occurs and to consult with staff before acting on these thoughts. Pt reports "it's gone down" when talking about the swelling in his feet.

## 2015-01-12 NOTE — Clinical Social Work Note (Signed)
CSW spoke w guardian, Dellis Alyea.  Explained that pt had not been staffed for [facility] ACT team service request yesterday at [place] - would be staffed next Tuesday.  As patient is nearing discharge, CSW asked if guardian wanted [facility] to look for ALF placement.  Guardian declined due to concerns about patient wandering in unfamiliar territory.  CSW advised that patient present for ACT Team assessment at [facility] crisis eval immediately upon discharge.  Guardian agreeable, understands that he will owe for these services under Medicare until IPRS is approved.  Guardian also asked if venous doppler study had been done, MD advised.  Guardian wants done prior to dc if possible.  Edwyna Shell, LCSW Clinical Social Worker

## 2015-01-13 LAB — GLUCOSE, CAPILLARY
GLUCOSE-CAPILLARY: 112 mg/dL — AB (ref 65–99)
GLUCOSE-CAPILLARY: 131 mg/dL — AB (ref 65–99)
Glucose-Capillary: 117 mg/dL — ABNORMAL HIGH (ref 65–99)
Glucose-Capillary: 158 mg/dL — ABNORMAL HIGH (ref 65–99)

## 2015-01-13 NOTE — Progress Notes (Signed)
Patient ID: Ivan [name], male   DOB: [birth year], 54 y.o.   MRN: [id number]  Pt currently presents with a flat affect and depressed behavior. Pt interaction remains minimal with writer per baseline. Pt attended groups but remained in bed for periods at a time today.   Pt provided with medications per providers orders. Pt's labs and vitals were monitored throughout the day. Pt supported emotionally and encouraged to express concerns and questions. Pt educated on diet/nutrition.  Pt's safety ensured with 15 minute and environmental checks. Pt currently denies SI/HI and A/V hallucinations. Pt verbally agrees to seek staff if SI/HI or A/VH occurs and to consult with staff before acting on these thoughts. Pt to be discharged tomorrow per MD. Pt plan to go back home and live with his sister.

## 2015-01-13 NOTE — Progress Notes (Signed)
[facility] MD Progress Note  01/13/2015 1:58 PM Ivan [name]  MRN:  [id number] Subjective: Patient states " I am fine. I am just waiting to be discharged.I come here usually due to other people causing problems. I am not going to talk any more about that. You said you are sending me home and now you stand here drilling me ."     Objective: Patient seen and chart reviewed.  Patient presented after [facility] - for agitation , aggression. Pt had been noncompliant on his medications.  Patient is well known to Probation officer from his previous hospitalizations . Pt after admission to [facility] always stays calm , superficially cooperative , compliant on medications,but is always a limited participant when it comes to answering questions during individual assessments as well as in group activities. Pt today stays in bed while assessment , eyes closed , denies any new concerns. Pt has been compliant with medications per staff.  No disruptive issues noted on the unit.  Pt has been declined by guardian for ALF placement. Pt to be discharged home once stable.      Principal Problem: Schizophrenia, multiple episodes , currently in acute episode Diagnosis:   Patient Active Problem List   Diagnosis Date Noted  . Paranoid schizophrenia [F20.0]   . Schizophrenia [F20.9] 01/04/2015  . CKD (chronic kidney disease) stage 3, GFR 30-59 ml/min [N18.3]   . Pedal edema [R60.0]   . Chronic pain syndrome [G89.4]   . Essential hypertension [I10]   . Edema of both legs [R60.0] 04/07/2013  . Type I diabetes mellitus with complication, uncontrolled [E10.69] 04/01/2013   Total Time spent with patient: 20 minutes   Past Medical History:  Past Medical History  Diagnosis Date  . Diabetes mellitus   . Hypertension   . Bipolar 1 disorder   . Schizoaffective disorder   . Hypokalemia 04/02/2013  . Edema of both legs 04/07/2013  . Paranoid schizophrenia     Past Surgical History  Procedure Laterality Date  . Breast surgery    .  Fracture surgery      ribs and arm   Family History:  Family History  Problem Relation Age of Onset  . Depression Neg Hx    Social History:  History  Alcohol Use No     History  Drug Use No    History   Social History  . Marital Status: Divorced    Spouse Name: N/A  . Number of Children: N/A  . Years of Education: N/A   Social History Main Topics  . Smoking status: Current Every Day Smoker -- 0.50 packs/day for 39 years    Types: Cigarettes, Cigars  . Smokeless tobacco: Never Used  . Alcohol Use: No  . Drug Use: No  . Sexual Activity: No   Other Topics Concern  . None   Social History Narrative   Additional History:    Sleep: Fair  Appetite:  Fair     Musculoskeletal: Strength & Muscle Tone: within normal limits Gait & Station: normal Patient leans: N/A   Psychiatric Specialty Exam: Physical Exam  Review of Systems  Cardiovascular: Positive for leg swelling (chronic , stable).  Psychiatric/Behavioral: The patient is nervous/anxious (stable).   All other systems reviewed and are negative.   Blood pressure 119/72, pulse 93, temperature 98.1 F (36.7 C), temperature source Oral, resp. rate 18, height 6' 2.5" (1.892 m), weight 117.482 kg (259 lb), SpO2 98 %.Body mass index is 32.82 kg/(m^2).  General Appearance: Fairly Groomed  Engineer, water::  Poor  Speech:  Normal Rate  Volume:  Normal  Mood:  Irritable on and off  Affect:  Restricted  Thought Process:  Linear  Orientation:  Full (Time, Place, and Person)  Thought Content:  Paranoid Ideation, PT IS GUARDED,which is how he presents all the time  Suicidal Thoughts:  No  Homicidal Thoughts:  No  Memory:  Immediate;   Fair Recent;   Fair Remote;   Fair  Judgement:  Impaired  Insight:  Shallow  Psychomotor Activity:  Normal  Concentration:  Fair  Recall:  [place]: Fair  Akathisia:  No  Handed:  Right  AIMS (if indicated):     Assets:  Social Support  ADL's:   Intact  Cognition: WNL  Sleep:  Number of Hours: 4     Current Medications: Current Facility-Administered Medications  Medication Dose Route Frequency Provider Last Rate Last Dose  . acetaminophen (TYLENOL) tablet 650 mg  650 mg Oral Q6H PRN Niel Hummer, NP      . alum & mag hydroxide-simeth (MAALOX/MYLANTA) 200-200-20 MG/5ML suspension 30 mL  30 mL Oral Q4H PRN Niel Hummer, NP      . amLODipine (NORVASC) tablet 5 mg  5 mg Oral BID Niel Hummer, NP   5 mg at 01/13/15 [id number]  . aspirin EC tablet 81 mg  81 mg Oral Daily Niel Hummer, NP   81 mg at 01/13/15 [id number]  . benztropine (COGENTIN) tablet 0.5 mg  0.5 mg Oral QPM Akoni Parton, MD   0.5 mg at 01/12/15 1810  . cloNIDine (CATAPRES) tablet 0.1 mg  0.1 mg Oral BID Niel Hummer, NP   0.1 mg at 01/13/15 [id number]  . furosemide (LASIX) tablet 40 mg  40 mg Oral BID Niel Hummer, NP   40 mg at 01/13/15 [id number]  . haloperidol (HALDOL) tablet 5 mg  5 mg Oral QPM Anderson Middlebrooks, MD   5 mg at 01/12/15 1810  . hydrALAZINE (APRESOLINE) tablet 100 mg  100 mg Oral TID Niel Hummer, NP   100 mg at 01/13/15 1206  . hydrOXYzine (ATARAX/VISTARIL) tablet 25 mg  25 mg Oral Q6H PRN Niel Hummer, NP   25 mg at 01/07/15 0034  . insulin aspart (novoLOG) injection 0-15 Units  0-15 Units Subcutaneous TID WC Ursula Alert, MD   Stopped at 01/13/15 1207  . linagliptin (TRADJENTA) tablet 5 mg  5 mg Oral Daily Niel Hummer, NP   5 mg at 01/13/15 [id number]  . magnesium hydroxide (MILK OF MAGNESIA) suspension 30 mL  30 mL Oral Daily PRN Niel Hummer, NP      . nicotine polacrilex (NICORETTE) gum 2 mg  2 mg Oral PRN Laverle Hobby, PA-C   2 mg at 01/13/15 1251  . OXcarbazepine (TRILEPTAL) tablet 150 mg  150 mg Oral BID Ursula Alert, MD   150 mg at 01/13/15 0833  . potassium chloride (K-DUR) CR tablet 10 mEq  10 mEq Oral BID Niel Hummer, NP   10 mEq at 01/13/15 [id number]  . traZODone (DESYREL) tablet 125 mg  125 mg Oral QHS Ursula Alert, MD   125 mg at 01/12/15 2225     Lab Results:  Results for orders placed or performed during the hospital encounter of 01/04/15 (from the past 48 hour(s))  Glucose, capillary     Status: Abnormal   Collection Time: 01/11/15  4:44 PM  Result Value Ref Range   Glucose-Capillary 147 (  H) 65 - 99 mg/dL  Glucose, capillary     Status: Abnormal   Collection Time: 01/11/15  8:26 PM  Result Value Ref Range   Glucose-Capillary 127 (H) 65 - 99 mg/dL  Glucose, capillary     Status: Abnormal   Collection Time: 01/12/15  5:39 AM  Result Value Ref Range   Glucose-Capillary 112 (H) 65 - 99 mg/dL  Glucose, capillary     Status: Abnormal   Collection Time: 01/12/15 11:57 AM  Result Value Ref Range   Glucose-Capillary 131 (H) 65 - 99 mg/dL  Glucose, capillary     Status: Abnormal   Collection Time: 01/12/15  5:07 PM  Result Value Ref Range   Glucose-Capillary 124 (H) 65 - 99 mg/dL   Comment 1 Notify RN    Comment 2 Document in Chart   Glucose, capillary     Status: Abnormal   Collection Time: 01/12/15  8:38 PM  Result Value Ref Range   Glucose-Capillary 164 (H) 65 - 99 mg/dL   Comment 1 Notify RN   Glucose, capillary     Status: Abnormal   Collection Time: 01/13/15  6:30 AM  Result Value Ref Range   Glucose-Capillary 112 (H) 65 - 99 mg/dL  Glucose, capillary     Status: Abnormal   Collection Time: 01/13/15 11:58 AM  Result Value Ref Range   Glucose-Capillary 117 (H) 65 - 99 mg/dL    Physical Findings: AIMS: Facial and Oral Movements Muscles of Facial Expression: None, normal Lips and Perioral Area: None, normal Jaw: None, normal Tongue: None, normal,Extremity Movements Upper (arms, wrists, hands, fingers): None, normal Lower (legs, knees, ankles, toes): None, normal, Trunk Movements Neck, shoulders, hips: None, normal, Overall Severity Severity of abnormal movements (highest score from questions above): None, normal Incapacitation due to abnormal movements: None, normal Patient's awareness of abnormal movements  (rate only patient's report): No Awareness, Dental Status Current problems with teeth and/or dentures?: No Does patient usually wear dentures?: No  CIWA:    COWS:     Assessment:  Patient is a 42 y old AAM , who has a hx of schizophrenia , multiple medical problems , noncompliance with medications, relational struggles with family, well known to [facility], presented again with irritability, paranoia .  Patient is alert, oriented x 4. Pt continues to be guarded , a limited participant in his treatment plan , but is compliant on medications. CSW working on placement. Will continue treatment.  Sister - Shale Cleavenger - is legal guardian.      Treatment Plan Summary: Daily contact with patient to assess and evaluate symptoms and progress in treatment and Medication management Will continue Haldol 5 mg po daily for psychosis, mood sx. AIMS - 0 (01/10/15) Will continue Cogentin 0.5 mg po daily for EPS. Will continue Trileptal 150 mg po bid for mood sx. Will continue Trazodone 125 mg po qhs for sleep-  Pain management with Tylenol 650 mg po prn. Continue Clonidine 0.1 mg po bid, Lasix 40 mg po bid , Norvasc 5 mg po bid for HTN. Tradjenta 5 mg po daily as well as Insulin as scheduled for DM.   Will continue to monitor vitals ,medication compliance and treatment side effects while patient is here.   CSW will start working on disposition-per [place] notes: 01/12/15 - pt cannot be referred for ALF ,since Guardian has declined it due to concerns of wandering in unfamiliar territory.  Patient refused venous doppler study. Pt had one study LE -left -  done in 06/2014 - was negative.   Medical Decision Making:  Established Problem, Stable/Improving (1), Review of Psycho-Social Stressors (1), Review of Last Therapy Session (1) and Review of Medication Regimen & Side Effects (2)     Ivan Kozakiewicz MD 01/13/2015, 1:58 PM

## 2015-01-13 NOTE — Plan of Care (Signed)
Problem: Alteration in thought process Goal: STG-Patient does not respond to command hallucinations Outcome: Progressing Pt denies A/VH. Pt does not appear to be responding to internal stimuli.

## 2015-01-13 NOTE — Progress Notes (Signed)
Recreation Therapy Notes  Animal-Assisted Activity (AAA) Program Checklist/Progress Notes Patient Eligibility Criteria Checklist & Daily Group note for Rec Tx Intervention  Date: 05.26.16 Time: 2:45pm Location: [address] [employer]   AAA/T Program Assumption of Risk Form signed by Patient/ or Parent Legal Guardian yes  Patient is free of allergies or sever asthma yes  Patient reports no fear of animals yes  Patient reports no history of cruelty to animalsyes  Patient understands his/her participation is voluntary yes  Patient washes hands before animal contact yes  Patient washes hands after animal contact yes  Education: Hand Washing, Appropriate Animal Interaction   Education Outcome: Acknowledges understanding/In group clarification offered/Needs additional education.   Clinical Observations/Feedback: Patient did not attend group.  [name], LRT/CTRS         [name] A 01/13/2015 4:12 PM

## 2015-01-13 NOTE — Progress Notes (Signed)
Patient ID: Ivan [name], male   DOB: 08/20/1961, 54 y.o.   MRN: [id number] D: Patient reports he is doing well but worried about discharge. Pt stated he has no support and does not consider sister as family anymore. Pt interaction is minimal with peers and staff. Pt attended evening wrap up group and engaged in discussion. Pt denies any needs or concerns. Cooperative with assessment.   A: Met with pt 1:1. Medications administered as prescribed. Writer encouraged pt to discuss feelings. Pt encouraged to come to staff with any questions or concerns.   R: Patient is safe on the unit. He is complaint with medications and denies any adverse reaction.

## 2015-01-13 NOTE — Progress Notes (Signed)
Patient ID: Ivan [name], male   DOB: [birth year], 54 y.o.   MRN: [id number]  Adult Psychoeducational Group Note  Date:  01/13/2015 Time: 09:15am  Group Topic/Focus:  Orientation:   The focus of this group is to educate the patient on the purpose and policies of crisis stabilization and provide a format to answer questions about their admission.  The group details unit policies and expectations of patients while admitted. Self Care:   The focus of this group is to help patients understand the importance of self-care in order to improve or restore emotional, physical, spiritual, interpersonal, and financial health.  Participation Level:  Did Not Attend  Participation Quality: n/a  Affect: n/a  Cognitive: n/a  Insight: n/a  Engagement in Group: n/a  Modes of Intervention:  Discussion, Education, Orientation and Support  Additional Comments:  Pt did not attend group. Pt in bed asleep.   [name] 01/13/2015, 10:18 AM

## 2015-01-14 ENCOUNTER — Encounter (HOSPITAL_COMMUNITY): Payer: Self-pay | Admitting: Registered Nurse

## 2015-01-14 LAB — GLUCOSE, CAPILLARY: Glucose-Capillary: 113 mg/dL — ABNORMAL HIGH (ref 65–99)

## 2015-01-14 MED ORDER — OXCARBAZEPINE 150 MG PO TABS: 150.0000 mg | ORAL_TABLET | Freq: Two times a day (BID) | ORAL | Status: DC

## 2015-01-14 MED ORDER — SITAGLIPTIN PHOSPHATE 100 MG PO TABS: 100.0000 mg | ORAL_TABLET | Freq: Every day | ORAL | Status: DC

## 2015-01-14 MED ORDER — POTASSIUM CHLORIDE ER 10 MEQ PO TBCR: 10.0000 meq | EXTENDED_RELEASE_TABLET | Freq: Two times a day (BID) | ORAL | Status: DC

## 2015-01-14 MED ORDER — BENZTROPINE MESYLATE 0.5 MG PO TABS: 0.5000 mg | ORAL_TABLET | Freq: Every day | ORAL | Status: DC

## 2015-01-14 MED ORDER — TRAZODONE HCL 100 MG PO TABS: 150.0000 mg | ORAL_TABLET | Freq: Every evening | ORAL | Status: DC | PRN

## 2015-01-14 MED ORDER — HALOPERIDOL 5 MG PO TABS: 5.0000 mg | ORAL_TABLET | Freq: Every day | ORAL | Status: DC

## 2015-01-14 MED ORDER — TRAZODONE HCL 150 MG PO TABS: 150.0000 mg | ORAL_TABLET | Freq: Every day | ORAL | Status: DC

## 2015-01-14 MED ORDER — HYDROXYZINE HCL 25 MG PO TABS: 25.0000 mg | ORAL_TABLET | Freq: Four times a day (QID) | ORAL | Status: DC | PRN

## 2015-01-14 MED ORDER — FUROSEMIDE 40 MG PO TABS: 40.0000 mg | ORAL_TABLET | Freq: Two times a day (BID) | ORAL | Status: DC

## 2015-01-14 MED ORDER — AMLODIPINE BESYLATE 5 MG PO TABS: 5.0000 mg | ORAL_TABLET | Freq: Two times a day (BID) | ORAL | Status: DC

## 2015-01-14 MED ORDER — CLONIDINE HCL 0.1 MG PO TABS: 0.1000 mg | ORAL_TABLET | Freq: Two times a day (BID) | ORAL | Status: DC

## 2015-01-14 MED ORDER — HYDRALAZINE HCL 100 MG PO TABS: 100.0000 mg | ORAL_TABLET | Freq: Three times a day (TID) | ORAL | Status: DC

## 2015-01-14 MED ORDER — LINAGLIPTIN 5 MG PO TABS: 5.0000 mg | ORAL_TABLET | Freq: Every day | ORAL | Status: DC

## 2015-01-14 NOTE — Progress Notes (Signed)
  [facility] Adult Case Management Discharge Plan :  Will you be returning to the same living situation after discharge:  Yes,  home At discharge, do you have transportation home?: Yes,  guardian Do you have the ability to pay for your medications: Yes,  MCR  Release of information consent forms completed and in the chart;  Patient's signature needed at discharge.  Patient to Follow up at: Follow-up Information    Follow up with [facility] On 01/14/2015.   Why:  Arrive today between 46 and 11:30 for your hospital follow up appointment   Contact information:   [place] [phone]      Follow up with Dr Eula Fried On 01/24/2015.   Why:  Monday at 9:45 with the Dr   Minette Brine information:   [place] [id number]      Patient denies SI/HI: Yes,  yes    Safety Planning and Suicide Prevention discussed: Yes,  yes  Have you used any form of tobacco in the last 30 days? (Cigarettes, Smokeless Tobacco, Cigars, and/or Pipes): Yes  Has patient been referred to the Quitline?: Patient refused referral  Pt stated, "The Dr just told me she would give me a prescription for the patch.  I don't need this."  Would not allow me to explain how Quitline is different.  [name] 01/14/2015, 10:53 AM

## 2015-01-14 NOTE — Plan of Care (Signed)
Problem: Alteration in thought process Goal: STG-Patient is able to discuss thoughts with staff Outcome: Not Progressing Pt is guarded and interaction is minimal with peers and staff

## 2015-01-14 NOTE — [facility] Suicide Risk Assessment (Signed)
[place] INPATIENT:  Family/Significant Other Suicide Prevention Education  Suicide Prevention Education:  Education Completed; No one has been identified by the patient as the family member/significant other with whom the patient will be residing, and identified as the person(s) who will aid the patient in the event of a mental health crisis (suicidal ideations/suicide attempt).  With written consent from the patient, the family member/significant other has been provided the following suicide prevention education, prior to the and/or following the discharge of the patient.  The suicide prevention education provided includes the following:  Suicide risk factors  Suicide prevention and interventions  National Suicide Hotline telephone number  [facility] assessment telephone number  [facility] Emergency Assistance [place] and/or Residential Mobile Crisis Unit telephone number  Request made of family/significant other to:  Remove weapons (e.g., guns, rifles, knives), all items previously/currently identified as safety concern.    Remove drugs/medications (over-the-counter, prescriptions, illicit drugs), all items previously/currently identified as a safety concern.  The family member/significant other verbalizes understanding of the suicide prevention education information provided.  The family member/significant other agrees to remove the items of safety concern listed above. The patient did not endorse SI at the time of admission, nor did the patient c/o SI during the stay here.  SPE not required. However, I did talk to sister, Jazzman Kerker, who is also guardian, [phone], about care plan and crises plan.  [name] 01/14/2015, 10:56 AM

## 2015-01-14 NOTE — [facility] Suicide Risk Assessment (Signed)
[facility] Discharge Suicide Risk Assessment   Demographic Factors:  Male  Total Time spent with patient: 30 minutes  Musculoskeletal: Strength & Muscle Tone: within normal limits Gait & Station: normal Patient leans: N/A  Psychiatric Specialty Exam: Physical Exam  Review of Systems  Cardiovascular: Positive for leg swelling (left lower extremities- chronic).  All other systems reviewed and are negative.   Blood pressure 123/79, pulse 91, temperature 98.6 F (37 C), temperature source Oral, resp. rate 18, height 6' 2.5" (1.892 m), weight 117.482 kg (259 lb), SpO2 98 %.Body mass index is 32.82 kg/(m^2).  General Appearance: Casual  Eye Contact::  Fair  Speech:  Clear and [id number]  Volume:  Normal  Mood:  Euthymic  Affect:  Congruent  Thought Process:  Coherent  Orientation:  Full (Time, Place, and Person)  Thought Content:  WDL  Suicidal Thoughts:  No  Homicidal Thoughts:  No  Memory:  Immediate;   Fair Recent;   Fair Remote;   Fair  Judgement:  Fair  Insight:  Shallow  Psychomotor Activity:  Normal  Concentration:  Fair  Recall:  [employer] of Knowledge:Fair  Language: Fair  Akathisia:  No  Handed:  Right  AIMS (if indicated):     Assets:  Social Support  Sleep:  Number of Hours: 5  Cognition: WNL  ADL's:  Intact   Have you used any form of tobacco in the last 30 days? (Cigarettes, Smokeless Tobacco, Cigars, and/or Pipes): Yes  Has this patient used any form of tobacco in the last 30 days? (Cigarettes, Smokeless Tobacco, Cigars, and/or Pipes) Yes, Prescription provided nicotine gum  Mental Status Per Nursing Assessment::   On Admission:     Current Mental Status by Physician: patient presented after disorganized behavior , being non compliant on medications. Pt through out his stay here was calm , cooperative , denied sleep issues, SI/HI/AH/VH. Pt was a limited participant in treatment plan , group activities, which is his baseline , however pt was complaint on  medications..  Loss Factors: relational struggles  Historical Factors: Impulsivity  Risk Reduction Factors:   Positive social support  Continued Clinical Symptoms:  Alcohol/Substance Abuse/Dependencies Previous Psychiatric Diagnoses and Treatments Medical Diagnoses and Treatments/Surgeries  Cognitive Features That Contribute To Risk:  Closed-mindedness    Suicide Risk:  Minimal: No identifiable suicidal ideation.  Patients presenting with no risk factors but with morbid ruminations; may be classified as minimal risk based on the severity of the depressive symptoms  Principal Problem: Paranoid schizophrenia Discharge Diagnoses:  Patient Active Problem List   Diagnosis Date Noted  . Paranoid schizophrenia [F20.0]   . CKD (chronic kidney disease) stage 3, GFR 30-59 ml/min [N18.3]   . Pedal edema [R60.0]   . Chronic pain syndrome [G89.4]   . Essential hypertension [I10]   . Edema of both legs [R60.0] 04/07/2013  . Type I diabetes mellitus with complication, uncontrolled [E10.69] 04/01/2013    Follow-up Information    Follow up with [facility] On 01/14/2015.   Why:  Arrive today between 48 and 11:30 for your hospital follow up appointment   Contact information:   [place] [phone]      Follow up with Dr Eula Fried On 01/24/2015.   Why:  Monday at 9:45 with the Dr   Minette Brine information:   [place] Care/Follow-up recommendations:  Activity:  no restrictions Diet:  carb modified, heart healthy  Tests:  as recommended by out patient provider, pt with CKD,pedal edema ,HTN,DM, will need follow up on discharge . Other:  na  Is patient on multiple antipsychotic therapies at discharge:  No   Has Patient had three or more failed trials of antipsychotic monotherapy by history:  No  Recommended Plan for Multiple Antipsychotic Therapies: NA    Korah Hufstedler MD 01/14/2015, 9:08 AM

## 2015-01-14 NOTE — Progress Notes (Signed)
D: Patient discharged home per MD order.  Patient received all personal belongings, prescriptions and medication samples.  He denies SI/HI/AVH.  He reports decreased depressive symptoms.  Patient's sister who is his guardian signed his discharge instructions and consent form.  Reviewed medications with guardian and patient and they indicated understanding.  Follow up instructions reviewed.  Patient left ambulatory with his guardian.

## 2015-01-14 NOTE — Progress Notes (Signed)
Patient ID: Ivan [name], male   DOB: Oct 15, 1960, 54 y.o.   MRN: [id number] D: Patient reports he is doing well and happy to discharge tomorrow. Pt interaction is minimal with peers and staff.  Pt denies any needs or concerns. Cooperative with assessment.   A: Met with pt 1:1. Medications administered as prescribed. Writer encouraged pt to discuss feelings. Pt encouraged to come to staff with any questions or concerns.   R: Patient is safe on the unit. He is complaint with medications and denies any adverse reaction.

## 2015-01-14 NOTE — Discharge Summary (Signed)
Physician Discharge Summary Note  Patient:  Ivan [name] is an 54 y.o., male MRN:  [id number] DOB:  12-28-1960 Patient phone:  [phone] (home)  Patient address:   [address] [place] [address],  Total Time spent with patient: Greater than 30 minutes  Date of Admission:  01/04/2015 Date of Discharge: 01/14/2015  Reason for Admission:  Per H&P Admission:  Ivan [name] is a 54y.o. AA male with a history of Diabetes Mellitus,Hypertension ,Chronic back pain ,Pedal edema ,Hx of Elevated LFTs due to Depakote ,CKD Stage [name] as well as schizophrenia , was brought to [facility] ED by EMS after he was found yelling in his front yard.Per initial notes in EHR " He told the ED staff that he did not want to harm himself or others, but hat he was not happy where he was. UPon assessment he presents as somewhat disheveled with fair eye contact and loud speech. His mood was angry and his affect was congruent. He said, "Just send me to [facility]. That's the best place I could go. They have good doctors there."   Patient is well known to Probation officer as well as to [facility]. Patient was recently discharged from the unit in April 2016. Pt today is uncooperative with evaluation. Pt appeared to be calm , no aggression noted. However when patient was asked questions regarding his current admission - patient stated " I do not want to answer anything. I am not my guardian " and he stormed out of the room.   Principal Problem: Paranoid schizophrenia Discharge Diagnoses: Patient Active Problem List   Diagnosis Date Noted  . Paranoid schizophrenia [F20.0]   . CKD (chronic kidney disease) stage 3, GFR 30-59 ml/min [N18.3]   . Pedal edema [R60.0]   . Chronic pain syndrome [G89.4]   . Essential hypertension [I10]   . Edema of both legs [R60.0] 04/07/2013  . Type I diabetes mellitus with complication, uncontrolled [E10.69] 04/01/2013    Musculoskeletal: Strength & Muscle Tone: within normal limits Gait &  Station: normal Patient leans: N/A  Psychiatric Specialty Exam:  See Suicide Risk Assessment Physical Exam  Nursing note and vitals reviewed. Constitutional: He is oriented to person, place, and time.  Neck: Normal range of motion.  Respiratory: Effort normal.  Musculoskeletal: Normal range of motion.  Neurological: He is alert and oriented to person, place, and time.    Review of Systems  Cardiovascular:       Hx Hypertension  Endo/Heme/Allergies:       Hx Type 2 Diabetes  Psychiatric/Behavioral: Negative for suicidal ideas and hallucinations. Depression: Stable. Nervous/anxious: Stable. Insomnia: Stable.   All other systems reviewed and are negative.   Blood pressure 123/79, pulse 91, temperature 98.6 F (37 C), temperature source Oral, resp. rate 18, height 6' 2.5" (1.892 m), weight 117.482 kg (259 lb), SpO2 98 %.Body mass index is 32.82 kg/(m^2).  Have you used any form of tobacco in the last 30 days? (Cigarettes, Smokeless Tobacco, Cigars, and/or Pipes): Yes  Has this patient used any form of tobacco in the last 30 days? (Cigarettes, Smokeless Tobacco, Cigars, and/or Pipes) Yes, A prescription for an FDA-approved tobacco cessation medication was offered at discharge and the patient refused  Past Medical History:  Past Medical History  Diagnosis Date  . Diabetes mellitus   . Hypertension   . Bipolar 1 disorder   . Schizoaffective disorder   . Hypokalemia 04/02/2013  . Edema of both legs 04/07/2013  . Paranoid schizophrenia  Past Surgical History  Procedure Laterality Date  . Breast surgery    . Fracture surgery      ribs and arm   Family History:  Family History  Problem Relation Age of Onset  . Depression Neg Hx    Social History:  History  Alcohol Use No     History  Drug Use No    History   Social History  . Marital Status: Divorced    Spouse Name: N/A  . Number of Children: N/A  . Years of Education: N/A   Social History Main Topics  . Smoking  status: Current Every Day Smoker -- 0.50 packs/day for 39 years    Types: Cigarettes, Cigars  . Smokeless tobacco: Never Used  . Alcohol Use: No  . Drug Use: No  . Sexual Activity: No   Other Topics Concern  . None   Social History Narrative    Past Psychiatric History: Hospitalizations:  Outpatient Care:  Substance Abuse Care:  Self-Mutilation:  Suicidal Attempts:  Violent Behaviors:   Risk to Self: Is patient at risk for suicide?: No What has been your use of drugs/alcohol within the last 12 months?: none Risk to Others:   Prior Inpatient Therapy:   Prior Outpatient Therapy:    Level of Care:  [facility]  Hospital Course:  Ivan [name] was admitted for Paranoid schizophrenia and crisis management.  He was treated discharged with the medications listed below under Medication List.  Medical problems were identified and treated as needed.  Home medications were restarted as appropriate.  Improvement was monitored by observation and Lorelee Cover daily report of symptom reduction.  Emotional and mental status was monitored by daily self-inventory reports completed by Lorelee Cover and clinical staff.         Ivan [name] was evaluated by the treatment team for stability and plans for continued recovery upon discharge.  Ivan [name] motivation was an integral factor for scheduling further treatment.  Employment, transportation, bed availability, health status, family support, and any pending legal issues were also considered during his hospital stay.  He was offered further treatment options upon discharge including but not limited to Residential, Intensive Outpatient, and Outpatient treatment.  Ivan [name] will follow up with the services as listed below under Follow Up Information.     Upon completion of this admission the patient was both mentally and medically stable for discharge denying suicidal/homicidal ideation, auditory/visual/tactile hallucinations, delusional thoughts and  paranoia.      Consults:  psychiatry  Significant Diagnostic Studies:  labs: ETOH, UDS, CBC, BMP, Lipid panel, HgbA1c  Discharge Vitals:   Blood pressure 123/79, pulse 91, temperature 98.6 F (37 C), temperature source Oral, resp. rate 18, height 6' 2.5" (1.892 m), weight 117.482 kg (259 lb), SpO2 98 %. Body mass index is 32.82 kg/(m^2). Lab Results:   Results for orders placed or performed during the hospital encounter of 01/04/15 (from the past 72 hour(s))  Glucose, capillary     Status: Abnormal   Collection Time: 01/11/15 12:04 PM  Result Value Ref Range   Glucose-Capillary 114 (H) 65 - 99 mg/dL  Glucose, capillary     Status: Abnormal   Collection Time: 01/11/15  4:44 PM  Result Value Ref Range   Glucose-Capillary 147 (H) 65 - 99 mg/dL  Glucose, capillary     Status: Abnormal   Collection Time: 01/11/15  8:26 PM  Result Value Ref Range   Glucose-Capillary 127 (H) 65 - 99 mg/dL  Glucose, capillary  Status: Abnormal   Collection Time: 01/12/15  5:39 AM  Result Value Ref Range   Glucose-Capillary 112 (H) 65 - 99 mg/dL  Glucose, capillary     Status: Abnormal   Collection Time: 01/12/15 11:57 AM  Result Value Ref Range   Glucose-Capillary 131 (H) 65 - 99 mg/dL  Glucose, capillary     Status: Abnormal   Collection Time: 01/12/15  5:07 PM  Result Value Ref Range   Glucose-Capillary 124 (H) 65 - 99 mg/dL   Comment 1 Notify RN    Comment 2 Document in Chart   Glucose, capillary     Status: Abnormal   Collection Time: 01/12/15  8:38 PM  Result Value Ref Range   Glucose-Capillary 164 (H) 65 - 99 mg/dL   Comment 1 Notify RN   Glucose, capillary     Status: Abnormal   Collection Time: 01/13/15  6:30 AM  Result Value Ref Range   Glucose-Capillary 112 (H) 65 - 99 mg/dL  Glucose, capillary     Status: Abnormal   Collection Time: 01/13/15 11:58 AM  Result Value Ref Range   Glucose-Capillary 117 (H) 65 - 99 mg/dL  Glucose, capillary     Status: Abnormal   Collection Time:  01/13/15  4:50 PM  Result Value Ref Range   Glucose-Capillary 158 (H) 65 - 99 mg/dL   Comment 1 Notify RN    Comment 2 Document in Chart   Glucose, capillary     Status: Abnormal   Collection Time: 01/13/15  8:34 PM  Result Value Ref Range   Glucose-Capillary 131 (H) 65 - 99 mg/dL  Glucose, capillary     Status: Abnormal   Collection Time: 01/14/15  5:51 AM  Result Value Ref Range   Glucose-Capillary 113 (H) 65 - 99 mg/dL   Comment 1 Notify RN     Physical Findings: AIMS: Facial and Oral Movements Muscles of Facial Expression: None, normal Lips and Perioral Area: None, normal Jaw: None, normal Tongue: None, normal,Extremity Movements Upper (arms, wrists, hands, fingers): None, normal Lower (legs, knees, ankles, toes): None, normal, Trunk Movements Neck, shoulders, hips: None, normal, Overall Severity Severity of abnormal movements (highest score from questions above): None, normal Incapacitation due to abnormal movements: None, normal Patient's awareness of abnormal movements (rate only patient's report): No Awareness, Dental Status Current problems with teeth and/or dentures?: No Does patient usually wear dentures?: No  CIWA:    COWS:      See Psychiatric Specialty Exam and Suicide Risk Assessment completed by Attending Physician prior to discharge.  Discharge destination:  [facility]  Is patient on multiple antipsychotic therapies at discharge:  No   Has Patient had three or more failed trials of antipsychotic monotherapy by history:  No    Recommended Plan for Multiple Antipsychotic Therapies: NA      Discharge Instructions    Activity as tolerated - No restrictions    Complete by:  As directed      Diet Carb Modified    Complete by:  As directed      Discharge instructions    Complete by:  As directed   Take all of you medications as prescribed by your mental healthcare provider.  Report any adverse effects and reactions from your medications to your  outpatient provider promptly. Do not engage in alcohol and or illegal drug use while on prescription medicines. In the event of worsening symptoms call the crisis hotline, 911, and or go to the nearest emergency department for  appropriate evaluation and treatment of symptoms. Follow-up with your primary care provider for your medical issues, concerns and or health care needs.   Keep all scheduled appointments.  If you are unable to keep an appointment call to reschedule.  Let the nurse know if you will need medications before next scheduled appointment.            Medication List    STOP taking these medications        allopurinol 300 MG tablet  Commonly known as:  ZYLOPRIM     gabapentin 400 MG capsule  Commonly known as:  NEURONTIN     labetalol 200 MG tablet  Commonly known as:  NORMODYNE     lamoTRIgine 100 MG tablet  Commonly known as:  LAMICTAL     sitaGLIPtin 100 MG tablet  Commonly known as:  JANUVIA      TAKE these medications      Indication   amLODipine 5 MG tablet  Commonly known as:  NORVASC  Take 1 tablet (5 mg total) by mouth 2 (two) times daily. For High blood pressure   Indication:  High Blood Pressure     aspirin 81 MG EC tablet  Take 1 tablet (81 mg total) by mouth daily.   Indication:  Heart Attack     benztropine 0.5 MG tablet  Commonly known as:  COGENTIN  Take 1 tablet (0.5 mg total) by mouth daily. For durg induced extrapyramidal reaction   Indication:  Extrapyramidal Reaction caused by Medications     cloNIDine 0.1 MG tablet  Commonly known as:  CATAPRES  Take 1 tablet (0.1 mg total) by mouth 2 (two) times daily. For high blood pressure   Indication:  High Blood Pressure     furosemide 40 MG tablet  Commonly known as:  LASIX  Take 1 tablet (40 mg total) by mouth 2 (two) times daily.   Indication:  High Blood Pressure     haloperidol 5 MG tablet  Commonly known as:  HALDOL  Take 1 tablet (5 mg total) by mouth daily. For mood  stabilization   Indication:  Mood Stabilization     hydrALAZINE 100 MG tablet  Commonly known as:  APRESOLINE  Take 1 tablet (100 mg total) by mouth 3 (three) times daily. For high blood pressure   Indication:  High Blood Pressure     hydrOXYzine 25 MG tablet  Commonly known as:  ATARAX/VISTARIL  Take 1 tablet (25 mg total) by mouth every 6 (six) hours as needed for anxiety (sleep).   Indication:  anxiety/sleep     linagliptin 5 MG Tabs tablet  Commonly known as:  TRADJENTA  Take 1 tablet (5 mg total) by mouth daily. For Type 2 Diabetes   Indication:  Type 2 Diabetes     OXcarbazepine 150 MG tablet  Commonly known as:  TRILEPTAL  Take 1 tablet (150 mg total) by mouth 2 (two) times daily. For Mood stabilization   Indication:  mood stabilization     potassium chloride 10 MEQ tablet  Commonly known as:  K-DUR  Take 1 tablet (10 mEq total) by mouth 2 (two) times daily. For hypokalemia   Indication:  Low Amount of Potassium in the Blood     traZODone 100 MG tablet  Commonly known as:  DESYREL  Take 1.5 tablets (150 mg total) by mouth at bedtime as needed for sleep.   Indication:  Trouble Sleeping       Follow-up Information  Follow up with [facility] On 01/14/2015.   Why:  Arrive today between 75 and 11:30 for your hospital follow up appointment   Contact information:   [place] [phone]      Follow up with Dr Eula Fried On 01/24/2015.   Why:  Monday at 9:45 with the Dr   Minette Brine information:   [place] [id number]      Follow-up recommendations:  Activity:  As tolrated Diet:  Low carb  Comments:   Patient has been instructed to take medications as prescribed; and report adverse effects to outpatient provider.  Follow up with primary doctor for any medical issues and If symptoms recur report to nearest emergency or crisis hot line.    Total Discharge Time: Greater than 30 minutes  Signed: Amando Ishikawa, FNP-BC 01/14/2015, 10:27 AM

## 2015-01-14 NOTE — Progress Notes (Signed)
Recreation Therapy Notes  Date: 05.27.16 Time: 9:30am Location: 300 Group Room  Group Topic: Stress Management  Goal Area(s) Addresses:  Patient will verbalize importance of using healthy stress management.  Patient will identify positive emotions associated with healthy stress management.   Intervention: Progressive Muscle Relaxation, Guided Imagery Script  Activity :  Progressive Muscle Relaxation & Guided Imagery.  LRT introduced and educated patients on stress management  Techniques of progressive muscle relaxation and guided imagery.  Scripts were used to deliver both techniques to patients, patients were asked to follow the script read allowed by LRT to engage in practicing both stress management techniques.  Education:  Stress Management, Discharge Planning.   Education Outcome: Acknowledges edcuation/In group clarification offered/Needs additional education  Clinical Observations/Feedback: Patient did not attend group.  Ivan [name], LRT/CTRS         Ivan [name] 01/14/2015 4:14 PM

## 2015-02-02 ENCOUNTER — Other Ambulatory Visit (HOSPITAL_COMMUNITY): Payer: Self-pay | Admitting: Psychiatry

## 2015-02-07 ENCOUNTER — Other Ambulatory Visit (HOSPITAL_COMMUNITY): Payer: Self-pay | Admitting: Psychiatry

## 2015-03-10 ENCOUNTER — Emergency Department (HOSPITAL_COMMUNITY)
Admission: EM | Admit: 2015-03-10 | Discharge: 2015-03-11 | Disposition: A | Discharge status: Other Acute Inpatient Hospital | Payer: Medicare Other | Attending: Emergency Medicine | Admitting: Emergency Medicine

## 2015-03-10 ENCOUNTER — Encounter (HOSPITAL_COMMUNITY): Payer: Self-pay

## 2015-03-10 DIAGNOSIS — E876 Hypokalemia: Secondary | ICD-10-CM | POA: Diagnosis not present

## 2015-03-10 DIAGNOSIS — E119 Type 2 diabetes mellitus without complications: Secondary | ICD-10-CM | POA: Diagnosis not present

## 2015-03-10 DIAGNOSIS — F319 Bipolar disorder, unspecified: Secondary | ICD-10-CM | POA: Insufficient documentation

## 2015-03-10 DIAGNOSIS — Z79899 Other long term (current) drug therapy: Secondary | ICD-10-CM | POA: Diagnosis not present

## 2015-03-10 DIAGNOSIS — F200 Paranoid schizophrenia: Secondary | ICD-10-CM | POA: Diagnosis not present

## 2015-03-10 DIAGNOSIS — Z720 Tobacco use: Secondary | ICD-10-CM | POA: Insufficient documentation

## 2015-03-10 DIAGNOSIS — Z046 Encounter for general psychiatric examination, requested by authority: Secondary | ICD-10-CM | POA: Diagnosis present

## 2015-03-10 DIAGNOSIS — Z7982 Long term (current) use of aspirin: Secondary | ICD-10-CM | POA: Diagnosis not present

## 2015-03-10 DIAGNOSIS — I10 Essential (primary) hypertension: Secondary | ICD-10-CM | POA: Diagnosis not present

## 2015-03-10 LAB — CBC WITH DIFFERENTIAL/PLATELET
Basophils Absolute: 0 10*3/uL (ref 0.0–0.1)
Basophils Relative: 1 % (ref 0–1)
EOS PCT: 4 % (ref 0–5)
Eosinophils Absolute: 0.3 10*3/uL (ref 0.0–0.7)
HEMATOCRIT: 34.1 % — AB (ref 39.0–52.0)
Hemoglobin: 11.1 g/dL — ABNORMAL LOW (ref 13.0–17.0)
Lymphocytes Relative: 34 % (ref 12–46)
Lymphs Abs: 2.2 10*3/uL (ref 0.7–4.0)
MCH: 29 pg (ref 26.0–34.0)
MCHC: 32.6 g/dL (ref 30.0–36.0)
MCV: 89 fL (ref 78.0–100.0)
MONOS PCT: 9 % (ref 3–12)
Monocytes Absolute: 0.6 10*3/uL (ref 0.1–1.0)
NEUTROS PCT: 52 % (ref 43–77)
Neutro Abs: 3.5 10*3/uL (ref 1.7–7.7)
Platelets: 393 10*3/uL (ref 150–400)
RBC: 3.83 MIL/uL — ABNORMAL LOW (ref 4.22–5.81)
RDW: 15.4 % (ref 11.5–15.5)
WBC: 6.6 10*3/uL (ref 4.0–10.5)

## 2015-03-10 LAB — BASIC METABOLIC PANEL
ANION GAP: 8 (ref 5–15)
BUN: 22 mg/dL — ABNORMAL HIGH (ref 6–20)
CALCIUM: 9.1 mg/dL (ref 8.9–10.3)
CHLORIDE: 108 mmol/L (ref 101–111)
CO2: 27 mmol/L (ref 22–32)
CREATININE: 1.84 mg/dL — AB (ref 0.61–1.24)
GFR calc Af Amer: 47 mL/min — ABNORMAL LOW (ref >60)
GFR, EST NON AFRICAN AMERICAN: 41 mL/min — AB (ref >60)
GLUCOSE: 105 mg/dL — AB (ref 65–99)
Potassium: 3 mmol/L — ABNORMAL LOW (ref 3.5–5.1)
Sodium: 143 mmol/L (ref 135–145)

## 2015-03-10 LAB — ETHANOL

## 2015-03-10 LAB — RAPID URINE DRUG SCREEN, HOSP PERFORMED
AMPHETAMINES: NOT DETECTED
BARBITURATES: NOT DETECTED
BENZODIAZEPINES: NOT DETECTED
Cocaine: NOT DETECTED
Opiates: NOT DETECTED
TETRAHYDROCANNABINOL: NOT DETECTED

## 2015-03-10 MED ORDER — ZIPRASIDONE MESYLATE 20 MG IM SOLR: 20.0000 mg | Freq: Once | INTRAMUSCULAR | Status: AC | PRN

## 2015-03-10 MED ORDER — POTASSIUM CHLORIDE CRYS ER 10 MEQ PO TBCR
10.0000 meq | EXTENDED_RELEASE_TABLET | Freq: Two times a day (BID) | ORAL | Status: DC
  Administered 2015-03-10: 10 meq via ORAL
  Filled 2015-03-10 (×9): qty 1

## 2015-03-10 MED ORDER — IBUPROFEN 400 MG PO TABS: 600.0000 mg | ORAL_TABLET | Freq: Three times a day (TID) | ORAL | Status: DC | PRN

## 2015-03-10 MED ORDER — CLONIDINE HCL 0.1 MG PO TABS
0.1000 mg | ORAL_TABLET | Freq: Two times a day (BID) | ORAL | Status: DC
  Administered 2015-03-10: 0.1 mg via ORAL
  Filled 2015-03-10 (×2): qty 1

## 2015-03-10 MED ORDER — FUROSEMIDE 40 MG PO TABS
40.0000 mg | ORAL_TABLET | Freq: Two times a day (BID) | ORAL | Status: DC
  Administered 2015-03-10 – 2015-03-11 (×3): 40 mg via ORAL
  Filled 2015-03-10 (×3): qty 1

## 2015-03-10 MED ORDER — POTASSIUM CHLORIDE CRYS ER 20 MEQ PO TBCR
40.0000 meq | EXTENDED_RELEASE_TABLET | Freq: Once | ORAL | Status: AC
  Administered 2015-03-10: 40 meq via ORAL
  Filled 2015-03-10: qty 2

## 2015-03-10 MED ORDER — LORAZEPAM 1 MG PO TABS: 1.0000 mg | ORAL_TABLET | Freq: Three times a day (TID) | ORAL | Status: DC | PRN

## 2015-03-10 MED ORDER — ASPIRIN EC 81 MG PO TBEC
81.0000 mg | DELAYED_RELEASE_TABLET | Freq: Every day | ORAL | Status: DC
  Filled 2015-03-10: qty 1

## 2015-03-10 MED ORDER — NICOTINE 21 MG/24HR TD PT24: 21.0000 mg | MEDICATED_PATCH | Freq: Every day | TRANSDERMAL | Status: DC

## 2015-03-10 MED ORDER — ONDANSETRON HCL 4 MG PO TABS
4.0000 mg | ORAL_TABLET | Freq: Three times a day (TID) | ORAL | Status: DC | PRN
Start: 2015-03-10 — End: 2015-03-11

## 2015-03-10 MED ORDER — ACETAMINOPHEN 325 MG PO TABS: 650.0000 mg | ORAL_TABLET | ORAL | Status: DC | PRN

## 2015-03-10 MED ORDER — BENZTROPINE MESYLATE 1 MG PO TABS
0.5000 mg | ORAL_TABLET | Freq: Every day | ORAL | Status: DC
  Filled 2015-03-10: qty 1

## 2015-03-10 MED ORDER — OXCARBAZEPINE 300 MG PO TABS
150.0000 mg | ORAL_TABLET | Freq: Two times a day (BID) | ORAL | Status: DC
  Filled 2015-03-10 (×9): qty 0.5

## 2015-03-10 MED ORDER — HALOPERIDOL 5 MG PO TABS
5.0000 mg | ORAL_TABLET | Freq: Every day | ORAL | Status: DC
  Filled 2015-03-10: qty 1

## 2015-03-10 MED ORDER — TRAZODONE HCL 50 MG PO TABS: 150.0000 mg | ORAL_TABLET | Freq: Every evening | ORAL | Status: DC | PRN

## 2015-03-10 MED ORDER — LINAGLIPTIN 5 MG PO TABS
5.0000 mg | ORAL_TABLET | Freq: Every day | ORAL | Status: DC
  Filled 2015-03-10 (×5): qty 1

## 2015-03-10 MED ORDER — AMLODIPINE BESYLATE 5 MG PO TABS
5.0000 mg | ORAL_TABLET | Freq: Two times a day (BID) | ORAL | Status: DC
  Administered 2015-03-10 – 2015-03-11 (×3): 5 mg via ORAL
  Filled 2015-03-10 (×3): qty 1

## 2015-03-10 MED ORDER — ALUM & MAG HYDROXIDE-SIMETH 200-200-20 MG/5ML PO SUSP: 30.0000 mL | ORAL | Status: DC | PRN

## 2015-03-10 MED ORDER — HYDRALAZINE HCL 25 MG PO TABS
100.0000 mg | ORAL_TABLET | Freq: Three times a day (TID) | ORAL | Status: DC
  Administered 2015-03-10 (×2): 100 mg via ORAL
  Filled 2015-03-10 (×13): qty 4

## 2015-03-10 MED ORDER — HYDROXYZINE HCL 25 MG PO TABS: 25.0000 mg | ORAL_TABLET | Freq: Four times a day (QID) | ORAL | Status: DC | PRN

## 2015-03-10 MED ORDER — ZOLPIDEM TARTRATE 5 MG PO TABS: 10.0000 mg | ORAL_TABLET | Freq: Every evening | ORAL | Status: DC | PRN

## 2015-03-10 NOTE — ED Notes (Signed)
Pt will not allow vitals to be taken.

## 2015-03-10 NOTE — Progress Notes (Signed)
Per Vikki Ports, pt. accepted at [facility] to Dr. Thurmon Fair, call report at [phone], can come after 9am on 7/22.   Verlon Setting, [place] Disposition staff 03/10/2015 10:59 PM

## 2015-03-10 NOTE — ED Notes (Signed)
Pt accepted to [facility] by Dr Thurmon Fair. Can be transferred after 0900 03/11/15. Call report to [phone].

## 2015-03-10 NOTE — Progress Notes (Signed)
Referral has been followed up at: [facility] - no answer. [facility] Hoes - left voicemail for [facility] requesting call back. [facility] - per Manuela Schwartz, couple of male and male beds open but call back in am due to intake being off in the evening. [place] - per [facility], re-fax referral. Referral re-faxed. [facility] - per Jacqlyn Larsen, try back later, referrals haven't been reviewed yet, couple beds open.  Declined at: [facility]  [facility] - due chronicity, per Caryl Pina  CSW will continue to seek placement and follow up.  Verlon Setting, [place] Disposition staff 03/10/2015 6:24 PM

## 2015-03-10 NOTE — Progress Notes (Signed)
Spoke with pt's guardian Sampson Self. She gave consent to communicate with [facility], where pt has been receiving services since June 2016.   Spoke with Tanzania Silver of [facility]. She states in the time that pt has been a client of [facility], she has met with him face- to face only once, as pt is "paranoid and won't let anyone in." States "The one time we talked to him he seemed very paranoid and was a little bit aggressive (verbal). We know his hx of barricading himself, and recently he changed the locks so his family could not enter. We do not know if he is eating or sleeping. We're concerned he is a danger to himself right now, as it seems he has been decompensating for about the past 4 weeks." She states the ACT team's hope is "that once he is stabilized, we can come meet with him (either inpatient or in the ED, wherever he becomes more stable) and start forming a relationship so that he will answer the door for [place] and come to an agreement to comply with treatment. As of yet we have not even been able to meet or observe him when stable, and we need that as a starting point in order to move forward with being able to engage him." She asks that [facility] be kept up to date regarding disposition so that they may meet with him when more stable and keep informed about his progress. [phone].  Seeking inpatient placement. Also considered for admission to [facility] upon bed availability.  Referals made: [facility]- per [facility]- per [facility]- per [facility]- per [facility]- per Ramond Dial, [facility] at capacity for high acuity)  Left voicemail for [facility] and [facility], and will refer if there is availability. [facility]ana Ho[facility]) states they will call back after checking bed status.   Sharren Bridge, MSW, [place] Clinical Social Work, Disposition  03/10/2015 [phone]
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
## 2015-03-10 NOTE — ED Notes (Signed)
Pt ambulated to restroom & returned to room w/ no complications. 
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
## 2015-03-10 NOTE — ED Notes (Signed)
Pt sitting on the end of bed. Few words spoken. Only states he wants something to drink.

## 2015-03-10 NOTE — ED Notes (Signed)
Pt brought in by [employer] for abnormal behaviour recently, has not been taking his psychiatric meds.  Pt will not answer questions asked by this nurse and appears to get aggressive and agitated when questioned.
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
## 2015-03-11 LAB — CBG MONITORING, ED: Glucose-Capillary: 117 mg/dL — ABNORMAL HIGH (ref 65–99)

## 2015-03-11 NOTE — ED Provider Notes (Signed)
The patient has been accepted at [facility] after 9:00 in the morning, the accepting physician's name is Dr. Odette Horns, MD 03/11/15 0001

## 2015-03-11 NOTE — Progress Notes (Signed)
Attempted contacting pt's guardian Canon Wykle, sister) at [phone] to inform her of pending transfer to [facility]. Unable to reach and voicemail box is full. Also attempting contacting pt's sister Ginger Hawbaker unsuccessfully [phone]).  Spoke with pt's ACTT team lead [place], informing her of placement. States she will also be attempting to contact guardian to inform her.   Sharren Bridge, MSW, [place] Clinical Social Work, Disposition  03/11/2015 [phone]

## 2015-03-11 NOTE — ED Notes (Signed)
Pt ambulated to restroom & returned to room w/ no complications. 

## 2015-03-11 NOTE — ED Notes (Addendum)
Pt ambulated to restroom & returned to room w/ no complications. Pt sitting in chair watching TV.

## 2015-08-21 HISTORY — PX: COLONOSCOPY: SHX174

## 2016-01-17 DIAGNOSIS — F17200 Nicotine dependence, unspecified, uncomplicated: Secondary | ICD-10-CM | POA: Insufficient documentation

## 2016-03-14 DIAGNOSIS — C439 Malignant melanoma of skin, unspecified: Secondary | ICD-10-CM | POA: Insufficient documentation

## 2017-04-04 HISTORY — PX: TRANSTHORACIC ECHOCARDIOGRAM: SHX275

## 2017-05-23 DIAGNOSIS — E1122 Type 2 diabetes mellitus with diabetic chronic kidney disease: Secondary | ICD-10-CM | POA: Diagnosis not present

## 2017-05-23 DIAGNOSIS — E1165 Type 2 diabetes mellitus with hyperglycemia: Secondary | ICD-10-CM | POA: Diagnosis not present

## 2017-05-23 DIAGNOSIS — E782 Mixed hyperlipidemia: Secondary | ICD-10-CM | POA: Diagnosis not present

## 2017-05-23 DIAGNOSIS — I10 Essential (primary) hypertension: Secondary | ICD-10-CM | POA: Diagnosis not present

## 2017-05-23 DIAGNOSIS — D631 Anemia in chronic kidney disease: Secondary | ICD-10-CM | POA: Diagnosis not present

## 2017-05-27 DIAGNOSIS — I517 Cardiomegaly: Secondary | ICD-10-CM | POA: Diagnosis not present

## 2017-05-27 DIAGNOSIS — I5032 Chronic diastolic (congestive) heart failure: Secondary | ICD-10-CM | POA: Diagnosis not present

## 2017-05-27 DIAGNOSIS — I129 Hypertensive chronic kidney disease with stage 1 through stage 4 chronic kidney disease, or unspecified chronic kidney disease: Secondary | ICD-10-CM | POA: Diagnosis not present

## 2017-05-27 DIAGNOSIS — E1122 Type 2 diabetes mellitus with diabetic chronic kidney disease: Secondary | ICD-10-CM | POA: Diagnosis not present

## 2017-05-27 DIAGNOSIS — N184 Chronic kidney disease, stage 4 (severe): Secondary | ICD-10-CM | POA: Diagnosis not present

## 2017-05-27 DIAGNOSIS — Z23 Encounter for immunization: Secondary | ICD-10-CM | POA: Diagnosis not present

## 2017-06-26 DIAGNOSIS — L110 Acquired keratosis follicularis: Secondary | ICD-10-CM | POA: Diagnosis not present

## 2017-06-26 DIAGNOSIS — E1140 Type 2 diabetes mellitus with diabetic neuropathy, unspecified: Secondary | ICD-10-CM | POA: Diagnosis not present

## 2017-06-26 DIAGNOSIS — B351 Tinea unguium: Secondary | ICD-10-CM | POA: Diagnosis not present

## 2017-08-09 DIAGNOSIS — Z4420 Encounter for fitting and adjustment of artificial eye, unspecified: Secondary | ICD-10-CM | POA: Diagnosis not present

## 2017-08-23 DIAGNOSIS — E1122 Type 2 diabetes mellitus with diabetic chronic kidney disease: Secondary | ICD-10-CM | POA: Diagnosis not present

## 2017-08-23 DIAGNOSIS — E782 Mixed hyperlipidemia: Secondary | ICD-10-CM | POA: Diagnosis not present

## 2017-08-23 DIAGNOSIS — N184 Chronic kidney disease, stage 4 (severe): Secondary | ICD-10-CM | POA: Diagnosis not present

## 2017-08-23 DIAGNOSIS — E1165 Type 2 diabetes mellitus with hyperglycemia: Secondary | ICD-10-CM | POA: Diagnosis not present

## 2017-08-23 DIAGNOSIS — I10 Essential (primary) hypertension: Secondary | ICD-10-CM | POA: Diagnosis not present

## 2017-08-23 DIAGNOSIS — D631 Anemia in chronic kidney disease: Secondary | ICD-10-CM | POA: Diagnosis not present

## 2017-08-27 DIAGNOSIS — I129 Hypertensive chronic kidney disease with stage 1 through stage 4 chronic kidney disease, or unspecified chronic kidney disease: Secondary | ICD-10-CM | POA: Diagnosis not present

## 2017-08-27 DIAGNOSIS — N184 Chronic kidney disease, stage 4 (severe): Secondary | ICD-10-CM | POA: Diagnosis not present

## 2017-08-27 DIAGNOSIS — E1122 Type 2 diabetes mellitus with diabetic chronic kidney disease: Secondary | ICD-10-CM | POA: Diagnosis not present

## 2017-08-27 DIAGNOSIS — D631 Anemia in chronic kidney disease: Secondary | ICD-10-CM | POA: Diagnosis not present

## 2017-08-27 DIAGNOSIS — Z0001 Encounter for general adult medical examination with abnormal findings: Secondary | ICD-10-CM | POA: Diagnosis not present

## 2017-09-18 DIAGNOSIS — L110 Acquired keratosis follicularis: Secondary | ICD-10-CM | POA: Diagnosis not present

## 2017-09-18 DIAGNOSIS — B351 Tinea unguium: Secondary | ICD-10-CM | POA: Diagnosis not present

## 2017-09-18 DIAGNOSIS — E1140 Type 2 diabetes mellitus with diabetic neuropathy, unspecified: Secondary | ICD-10-CM | POA: Diagnosis not present

## 2017-11-22 DIAGNOSIS — E1122 Type 2 diabetes mellitus with diabetic chronic kidney disease: Secondary | ICD-10-CM | POA: Diagnosis not present

## 2017-11-22 DIAGNOSIS — E1165 Type 2 diabetes mellitus with hyperglycemia: Secondary | ICD-10-CM | POA: Diagnosis not present

## 2017-11-22 DIAGNOSIS — E782 Mixed hyperlipidemia: Secondary | ICD-10-CM | POA: Diagnosis not present

## 2017-11-22 DIAGNOSIS — I10 Essential (primary) hypertension: Secondary | ICD-10-CM | POA: Diagnosis not present

## 2017-11-26 DIAGNOSIS — E1122 Type 2 diabetes mellitus with diabetic chronic kidney disease: Secondary | ICD-10-CM | POA: Diagnosis not present

## 2017-11-26 DIAGNOSIS — I10 Essential (primary) hypertension: Secondary | ICD-10-CM | POA: Diagnosis not present

## 2017-11-26 DIAGNOSIS — E1165 Type 2 diabetes mellitus with hyperglycemia: Secondary | ICD-10-CM | POA: Diagnosis not present

## 2017-11-26 DIAGNOSIS — N184 Chronic kidney disease, stage 4 (severe): Secondary | ICD-10-CM | POA: Diagnosis not present

## 2017-11-26 DIAGNOSIS — I129 Hypertensive chronic kidney disease with stage 1 through stage 4 chronic kidney disease, or unspecified chronic kidney disease: Secondary | ICD-10-CM | POA: Diagnosis not present

## 2017-11-28 DIAGNOSIS — I129 Hypertensive chronic kidney disease with stage 1 through stage 4 chronic kidney disease, or unspecified chronic kidney disease: Secondary | ICD-10-CM | POA: Diagnosis not present

## 2017-11-28 DIAGNOSIS — D631 Anemia in chronic kidney disease: Secondary | ICD-10-CM | POA: Diagnosis not present

## 2017-11-28 DIAGNOSIS — R809 Proteinuria, unspecified: Secondary | ICD-10-CM | POA: Diagnosis not present

## 2017-11-28 DIAGNOSIS — N183 Chronic kidney disease, stage 3 (moderate): Secondary | ICD-10-CM | POA: Diagnosis not present

## 2017-11-28 DIAGNOSIS — E1122 Type 2 diabetes mellitus with diabetic chronic kidney disease: Secondary | ICD-10-CM | POA: Diagnosis not present

## 2017-12-04 DIAGNOSIS — E1140 Type 2 diabetes mellitus with diabetic neuropathy, unspecified: Secondary | ICD-10-CM | POA: Diagnosis not present

## 2017-12-04 DIAGNOSIS — L110 Acquired keratosis follicularis: Secondary | ICD-10-CM | POA: Diagnosis not present

## 2017-12-04 DIAGNOSIS — B351 Tinea unguium: Secondary | ICD-10-CM | POA: Diagnosis not present

## 2017-12-31 DIAGNOSIS — N183 Chronic kidney disease, stage 3 (moderate): Secondary | ICD-10-CM | POA: Diagnosis not present

## 2017-12-31 DIAGNOSIS — N281 Cyst of kidney, acquired: Secondary | ICD-10-CM | POA: Diagnosis not present

## 2018-01-06 ENCOUNTER — Other Ambulatory Visit: Payer: Self-pay

## 2018-01-06 NOTE — Patient Outreach (Signed)
[facility]) Care Management  01/06/2018  Ivan [name] 03-09-1961 [id number]   Medication Adherence call to [name] patient voice message is full can not leave messages patient is past due under [facility] Ins.on Atorvastatin & Metformin call [place] they said doctor has stop  Metformin at this time and Atorvastatin they do a pill pack and was fill and send to the patient on 12/29/17.   [place] Management Direct Dial [phone]  Fax [phone] [name]

## 2018-01-27 ENCOUNTER — Other Ambulatory Visit: Payer: Self-pay

## 2018-01-27 NOTE — Patient Outreach (Signed)
[facility]) Care Management  01/27/2018  Ivan [name] 1961-03-21 [id number]   Medication Adherence call to Mr. Ivan [name] patient voice message is full and can not leave messages patient is past due on Metformin 500 mg. last month [place] said patient is no longer taking Metformin 500 mg doctor has stop this medication. Ivan [name] is showing past due under [place].   [place] Management Direct Dial [phone]  Fax [phone] [name]

## 2018-02-07 DIAGNOSIS — Z4420 Encounter for fitting and adjustment of artificial eye, unspecified: Secondary | ICD-10-CM | POA: Diagnosis not present

## 2018-02-13 DIAGNOSIS — B351 Tinea unguium: Secondary | ICD-10-CM | POA: Diagnosis not present

## 2018-02-13 DIAGNOSIS — L110 Acquired keratosis follicularis: Secondary | ICD-10-CM | POA: Diagnosis not present

## 2018-02-13 DIAGNOSIS — E1140 Type 2 diabetes mellitus with diabetic neuropathy, unspecified: Secondary | ICD-10-CM | POA: Diagnosis not present

## 2018-02-24 DIAGNOSIS — E782 Mixed hyperlipidemia: Secondary | ICD-10-CM | POA: Diagnosis not present

## 2018-02-24 DIAGNOSIS — I10 Essential (primary) hypertension: Secondary | ICD-10-CM | POA: Diagnosis not present

## 2018-02-24 DIAGNOSIS — E1122 Type 2 diabetes mellitus with diabetic chronic kidney disease: Secondary | ICD-10-CM | POA: Diagnosis not present

## 2018-02-24 DIAGNOSIS — N184 Chronic kidney disease, stage 4 (severe): Secondary | ICD-10-CM | POA: Diagnosis not present

## 2018-02-27 DIAGNOSIS — I129 Hypertensive chronic kidney disease with stage 1 through stage 4 chronic kidney disease, or unspecified chronic kidney disease: Secondary | ICD-10-CM | POA: Diagnosis not present

## 2018-02-27 DIAGNOSIS — I10 Essential (primary) hypertension: Secondary | ICD-10-CM | POA: Diagnosis not present

## 2018-02-27 DIAGNOSIS — E1122 Type 2 diabetes mellitus with diabetic chronic kidney disease: Secondary | ICD-10-CM | POA: Diagnosis not present

## 2018-02-27 DIAGNOSIS — N184 Chronic kidney disease, stage 4 (severe): Secondary | ICD-10-CM | POA: Diagnosis not present

## 2018-02-27 DIAGNOSIS — E1165 Type 2 diabetes mellitus with hyperglycemia: Secondary | ICD-10-CM | POA: Diagnosis not present

## 2018-03-04 DIAGNOSIS — Z842 Family history of other diseases of the genitourinary system: Secondary | ICD-10-CM | POA: Diagnosis not present

## 2018-03-04 DIAGNOSIS — R809 Proteinuria, unspecified: Secondary | ICD-10-CM | POA: Diagnosis not present

## 2018-03-04 DIAGNOSIS — Z7984 Long term (current) use of oral hypoglycemic drugs: Secondary | ICD-10-CM | POA: Diagnosis not present

## 2018-03-04 DIAGNOSIS — N183 Chronic kidney disease, stage 3 (moderate): Secondary | ICD-10-CM | POA: Diagnosis not present

## 2018-03-04 DIAGNOSIS — I129 Hypertensive chronic kidney disease with stage 1 through stage 4 chronic kidney disease, or unspecified chronic kidney disease: Secondary | ICD-10-CM | POA: Diagnosis not present

## 2018-03-04 DIAGNOSIS — D631 Anemia in chronic kidney disease: Secondary | ICD-10-CM | POA: Diagnosis not present

## 2018-03-04 DIAGNOSIS — E1122 Type 2 diabetes mellitus with diabetic chronic kidney disease: Secondary | ICD-10-CM | POA: Diagnosis not present

## 2018-05-01 DIAGNOSIS — B351 Tinea unguium: Secondary | ICD-10-CM | POA: Diagnosis not present

## 2018-05-01 DIAGNOSIS — L110 Acquired keratosis follicularis: Secondary | ICD-10-CM | POA: Diagnosis not present

## 2018-05-01 DIAGNOSIS — E1140 Type 2 diabetes mellitus with diabetic neuropathy, unspecified: Secondary | ICD-10-CM | POA: Diagnosis not present

## 2018-05-22 DIAGNOSIS — E782 Mixed hyperlipidemia: Secondary | ICD-10-CM | POA: Diagnosis not present

## 2018-05-22 DIAGNOSIS — I10 Essential (primary) hypertension: Secondary | ICD-10-CM | POA: Diagnosis not present

## 2018-05-22 DIAGNOSIS — E1122 Type 2 diabetes mellitus with diabetic chronic kidney disease: Secondary | ICD-10-CM | POA: Diagnosis not present

## 2018-05-22 DIAGNOSIS — N184 Chronic kidney disease, stage 4 (severe): Secondary | ICD-10-CM | POA: Diagnosis not present

## 2018-05-27 DIAGNOSIS — N184 Chronic kidney disease, stage 4 (severe): Secondary | ICD-10-CM | POA: Diagnosis not present

## 2018-05-27 DIAGNOSIS — I5032 Chronic diastolic (congestive) heart failure: Secondary | ICD-10-CM | POA: Diagnosis not present

## 2018-05-27 DIAGNOSIS — Z23 Encounter for immunization: Secondary | ICD-10-CM | POA: Diagnosis not present

## 2018-05-27 DIAGNOSIS — I129 Hypertensive chronic kidney disease with stage 1 through stage 4 chronic kidney disease, or unspecified chronic kidney disease: Secondary | ICD-10-CM | POA: Diagnosis not present

## 2018-05-27 DIAGNOSIS — E1165 Type 2 diabetes mellitus with hyperglycemia: Secondary | ICD-10-CM | POA: Diagnosis not present

## 2018-05-27 DIAGNOSIS — E1122 Type 2 diabetes mellitus with diabetic chronic kidney disease: Secondary | ICD-10-CM | POA: Diagnosis not present

## 2018-07-24 DIAGNOSIS — E1140 Type 2 diabetes mellitus with diabetic neuropathy, unspecified: Secondary | ICD-10-CM | POA: Diagnosis not present

## 2018-07-24 DIAGNOSIS — L110 Acquired keratosis follicularis: Secondary | ICD-10-CM | POA: Diagnosis not present

## 2018-07-24 DIAGNOSIS — B351 Tinea unguium: Secondary | ICD-10-CM | POA: Diagnosis not present

## 2018-08-01 DIAGNOSIS — Z4420 Encounter for fitting and adjustment of artificial eye, unspecified: Secondary | ICD-10-CM | POA: Diagnosis not present

## 2018-08-21 DIAGNOSIS — E782 Mixed hyperlipidemia: Secondary | ICD-10-CM | POA: Diagnosis not present

## 2018-08-21 DIAGNOSIS — N184 Chronic kidney disease, stage 4 (severe): Secondary | ICD-10-CM | POA: Diagnosis not present

## 2018-08-21 DIAGNOSIS — I10 Essential (primary) hypertension: Secondary | ICD-10-CM | POA: Diagnosis not present

## 2018-08-21 DIAGNOSIS — D631 Anemia in chronic kidney disease: Secondary | ICD-10-CM | POA: Diagnosis not present

## 2018-08-21 DIAGNOSIS — E1122 Type 2 diabetes mellitus with diabetic chronic kidney disease: Secondary | ICD-10-CM | POA: Diagnosis not present

## 2018-08-26 DIAGNOSIS — I129 Hypertensive chronic kidney disease with stage 1 through stage 4 chronic kidney disease, or unspecified chronic kidney disease: Secondary | ICD-10-CM | POA: Diagnosis not present

## 2018-08-26 DIAGNOSIS — E1122 Type 2 diabetes mellitus with diabetic chronic kidney disease: Secondary | ICD-10-CM | POA: Diagnosis not present

## 2018-08-26 DIAGNOSIS — I10 Essential (primary) hypertension: Secondary | ICD-10-CM | POA: Diagnosis not present

## 2018-10-15 DIAGNOSIS — B351 Tinea unguium: Secondary | ICD-10-CM | POA: Diagnosis not present

## 2018-10-15 DIAGNOSIS — E1140 Type 2 diabetes mellitus with diabetic neuropathy, unspecified: Secondary | ICD-10-CM | POA: Diagnosis not present

## 2018-10-15 DIAGNOSIS — L110 Acquired keratosis follicularis: Secondary | ICD-10-CM | POA: Diagnosis not present

## 2018-10-20 DIAGNOSIS — T85398A Other mechanical complication of other ocular prosthetic devices, implants and grafts, initial encounter: Secondary | ICD-10-CM | POA: Diagnosis not present

## 2018-10-20 DIAGNOSIS — Z9001 Acquired absence of eye: Secondary | ICD-10-CM | POA: Diagnosis not present

## 2018-10-20 DIAGNOSIS — C6931 Malignant neoplasm of right choroid: Secondary | ICD-10-CM | POA: Diagnosis not present

## 2018-11-17 DIAGNOSIS — I10 Essential (primary) hypertension: Secondary | ICD-10-CM | POA: Diagnosis not present

## 2018-11-17 DIAGNOSIS — N184 Chronic kidney disease, stage 4 (severe): Secondary | ICD-10-CM | POA: Diagnosis not present

## 2018-11-17 DIAGNOSIS — D631 Anemia in chronic kidney disease: Secondary | ICD-10-CM | POA: Diagnosis not present

## 2018-11-17 DIAGNOSIS — I129 Hypertensive chronic kidney disease with stage 1 through stage 4 chronic kidney disease, or unspecified chronic kidney disease: Secondary | ICD-10-CM | POA: Diagnosis not present

## 2018-11-17 DIAGNOSIS — E782 Mixed hyperlipidemia: Secondary | ICD-10-CM | POA: Diagnosis not present

## 2018-11-18 DIAGNOSIS — E782 Mixed hyperlipidemia: Secondary | ICD-10-CM | POA: Diagnosis not present

## 2018-11-18 DIAGNOSIS — N184 Chronic kidney disease, stage 4 (severe): Secondary | ICD-10-CM | POA: Diagnosis not present

## 2018-11-18 DIAGNOSIS — Z0001 Encounter for general adult medical examination with abnormal findings: Secondary | ICD-10-CM | POA: Diagnosis not present

## 2018-11-18 DIAGNOSIS — E1165 Type 2 diabetes mellitus with hyperglycemia: Secondary | ICD-10-CM | POA: Diagnosis not present

## 2018-11-18 DIAGNOSIS — I10 Essential (primary) hypertension: Secondary | ICD-10-CM | POA: Diagnosis not present

## 2018-11-18 DIAGNOSIS — I129 Hypertensive chronic kidney disease with stage 1 through stage 4 chronic kidney disease, or unspecified chronic kidney disease: Secondary | ICD-10-CM | POA: Diagnosis not present

## 2018-11-18 DIAGNOSIS — D631 Anemia in chronic kidney disease: Secondary | ICD-10-CM | POA: Diagnosis not present

## 2018-11-20 DIAGNOSIS — E1142 Type 2 diabetes mellitus with diabetic polyneuropathy: Secondary | ICD-10-CM | POA: Diagnosis not present

## 2018-11-20 DIAGNOSIS — E1122 Type 2 diabetes mellitus with diabetic chronic kidney disease: Secondary | ICD-10-CM | POA: Diagnosis not present

## 2018-11-20 DIAGNOSIS — I10 Essential (primary) hypertension: Secondary | ICD-10-CM | POA: Diagnosis not present

## 2018-11-20 DIAGNOSIS — I129 Hypertensive chronic kidney disease with stage 1 through stage 4 chronic kidney disease, or unspecified chronic kidney disease: Secondary | ICD-10-CM | POA: Diagnosis not present

## 2018-12-31 DIAGNOSIS — E1140 Type 2 diabetes mellitus with diabetic neuropathy, unspecified: Secondary | ICD-10-CM | POA: Diagnosis not present

## 2018-12-31 DIAGNOSIS — B351 Tinea unguium: Secondary | ICD-10-CM | POA: Diagnosis not present

## 2018-12-31 DIAGNOSIS — L110 Acquired keratosis follicularis: Secondary | ICD-10-CM | POA: Diagnosis not present

## 2019-01-02 ENCOUNTER — Ambulatory Visit (HOSPITAL_COMMUNITY)
Admission: RE | Admit: 2019-01-02 | Discharge: 2019-01-02 | Disposition: A | Discharge status: Home / Self Care | Payer: Medicare Other | Source: Ambulatory Visit | Attending: Family Medicine | Admitting: Family Medicine

## 2019-01-02 ENCOUNTER — Other Ambulatory Visit (HOSPITAL_COMMUNITY): Payer: Self-pay | Admitting: Family Medicine

## 2019-01-02 ENCOUNTER — Other Ambulatory Visit: Payer: Self-pay | Admitting: Family Medicine

## 2019-01-02 ENCOUNTER — Other Ambulatory Visit: Payer: Self-pay

## 2019-01-02 DIAGNOSIS — E1142 Type 2 diabetes mellitus with diabetic polyneuropathy: Secondary | ICD-10-CM | POA: Diagnosis not present

## 2019-01-02 DIAGNOSIS — M7989 Other specified soft tissue disorders: Secondary | ICD-10-CM | POA: Insufficient documentation

## 2019-01-02 DIAGNOSIS — R600 Localized edema: Secondary | ICD-10-CM | POA: Diagnosis not present

## 2019-01-02 DIAGNOSIS — E1122 Type 2 diabetes mellitus with diabetic chronic kidney disease: Secondary | ICD-10-CM | POA: Diagnosis not present

## 2019-01-02 DIAGNOSIS — I10 Essential (primary) hypertension: Secondary | ICD-10-CM | POA: Diagnosis not present

## 2019-01-02 DIAGNOSIS — I5032 Chronic diastolic (congestive) heart failure: Secondary | ICD-10-CM | POA: Diagnosis not present

## 2019-01-15 DIAGNOSIS — C6931 Malignant neoplasm of right choroid: Secondary | ICD-10-CM | POA: Diagnosis not present

## 2019-02-17 DIAGNOSIS — N189 Chronic kidney disease, unspecified: Secondary | ICD-10-CM | POA: Diagnosis not present

## 2019-02-17 DIAGNOSIS — E1165 Type 2 diabetes mellitus with hyperglycemia: Secondary | ICD-10-CM | POA: Diagnosis not present

## 2019-02-17 DIAGNOSIS — E782 Mixed hyperlipidemia: Secondary | ICD-10-CM | POA: Diagnosis not present

## 2019-02-17 DIAGNOSIS — I10 Essential (primary) hypertension: Secondary | ICD-10-CM | POA: Diagnosis not present

## 2019-02-17 DIAGNOSIS — N184 Chronic kidney disease, stage 4 (severe): Secondary | ICD-10-CM | POA: Diagnosis not present

## 2019-02-25 DIAGNOSIS — E1142 Type 2 diabetes mellitus with diabetic polyneuropathy: Secondary | ICD-10-CM | POA: Diagnosis not present

## 2019-02-25 DIAGNOSIS — E1122 Type 2 diabetes mellitus with diabetic chronic kidney disease: Secondary | ICD-10-CM | POA: Diagnosis not present

## 2019-02-25 DIAGNOSIS — I5032 Chronic diastolic (congestive) heart failure: Secondary | ICD-10-CM | POA: Diagnosis not present

## 2019-02-25 DIAGNOSIS — N184 Chronic kidney disease, stage 4 (severe): Secondary | ICD-10-CM | POA: Diagnosis not present

## 2019-02-25 DIAGNOSIS — I10 Essential (primary) hypertension: Secondary | ICD-10-CM | POA: Diagnosis not present

## 2019-03-05 DIAGNOSIS — T85398A Other mechanical complication of other ocular prosthetic devices, implants and grafts, initial encounter: Secondary | ICD-10-CM | POA: Diagnosis not present

## 2019-03-05 DIAGNOSIS — Z7984 Long term (current) use of oral hypoglycemic drugs: Secondary | ICD-10-CM | POA: Diagnosis not present

## 2019-03-05 DIAGNOSIS — E1122 Type 2 diabetes mellitus with diabetic chronic kidney disease: Secondary | ICD-10-CM | POA: Diagnosis not present

## 2019-03-05 DIAGNOSIS — N183 Chronic kidney disease, stage 3 (moderate): Secondary | ICD-10-CM | POA: Diagnosis not present

## 2019-03-05 DIAGNOSIS — I129 Hypertensive chronic kidney disease with stage 1 through stage 4 chronic kidney disease, or unspecified chronic kidney disease: Secondary | ICD-10-CM | POA: Diagnosis not present

## 2019-03-05 DIAGNOSIS — R609 Edema, unspecified: Secondary | ICD-10-CM | POA: Diagnosis not present

## 2019-03-05 DIAGNOSIS — Z01812 Encounter for preprocedural laboratory examination: Secondary | ICD-10-CM | POA: Diagnosis not present

## 2019-03-05 DIAGNOSIS — T85898D Other specified complication of other internal prosthetic devices, implants and grafts, subsequent encounter: Secondary | ICD-10-CM | POA: Diagnosis not present

## 2019-03-05 DIAGNOSIS — D631 Anemia in chronic kidney disease: Secondary | ICD-10-CM | POA: Diagnosis not present

## 2019-03-05 DIAGNOSIS — E785 Hyperlipidemia, unspecified: Secondary | ICD-10-CM | POA: Diagnosis not present

## 2019-03-05 DIAGNOSIS — Z1159 Encounter for screening for other viral diseases: Secondary | ICD-10-CM | POA: Diagnosis not present

## 2019-03-13 DIAGNOSIS — M1990 Unspecified osteoarthritis, unspecified site: Secondary | ICD-10-CM | POA: Insufficient documentation

## 2019-03-18 DIAGNOSIS — B351 Tinea unguium: Secondary | ICD-10-CM | POA: Diagnosis not present

## 2019-03-18 DIAGNOSIS — E1140 Type 2 diabetes mellitus with diabetic neuropathy, unspecified: Secondary | ICD-10-CM | POA: Diagnosis not present

## 2019-03-18 DIAGNOSIS — L110 Acquired keratosis follicularis: Secondary | ICD-10-CM | POA: Diagnosis not present

## 2019-03-26 DIAGNOSIS — N184 Chronic kidney disease, stage 4 (severe): Secondary | ICD-10-CM | POA: Diagnosis not present

## 2019-03-26 DIAGNOSIS — D631 Anemia in chronic kidney disease: Secondary | ICD-10-CM | POA: Diagnosis not present

## 2019-03-26 DIAGNOSIS — E1122 Type 2 diabetes mellitus with diabetic chronic kidney disease: Secondary | ICD-10-CM | POA: Diagnosis not present

## 2019-03-26 DIAGNOSIS — Z79899 Other long term (current) drug therapy: Secondary | ICD-10-CM | POA: Diagnosis not present

## 2019-03-26 DIAGNOSIS — Z7984 Long term (current) use of oral hypoglycemic drugs: Secondary | ICD-10-CM | POA: Diagnosis not present

## 2019-03-26 DIAGNOSIS — E876 Hypokalemia: Secondary | ICD-10-CM | POA: Diagnosis not present

## 2019-03-26 DIAGNOSIS — R801 Persistent proteinuria, unspecified: Secondary | ICD-10-CM | POA: Diagnosis not present

## 2019-03-26 DIAGNOSIS — I129 Hypertensive chronic kidney disease with stage 1 through stage 4 chronic kidney disease, or unspecified chronic kidney disease: Secondary | ICD-10-CM | POA: Diagnosis not present

## 2019-03-27 DIAGNOSIS — D631 Anemia in chronic kidney disease: Secondary | ICD-10-CM | POA: Insufficient documentation

## 2019-04-09 DIAGNOSIS — Z01812 Encounter for preprocedural laboratory examination: Secondary | ICD-10-CM | POA: Diagnosis not present

## 2019-04-09 DIAGNOSIS — T85398A Other mechanical complication of other ocular prosthetic devices, implants and grafts, initial encounter: Secondary | ICD-10-CM | POA: Diagnosis not present

## 2019-04-09 DIAGNOSIS — T85310S Breakdown (mechanical) of prosthetic orbit of right eye, sequela: Secondary | ICD-10-CM | POA: Diagnosis not present

## 2019-04-16 DIAGNOSIS — Z85840 Personal history of malignant neoplasm of eye: Secondary | ICD-10-CM | POA: Diagnosis not present

## 2019-04-16 DIAGNOSIS — T85328A Displacement of other ocular prosthetic devices, implants and grafts, initial encounter: Secondary | ICD-10-CM | POA: Diagnosis not present

## 2019-04-16 DIAGNOSIS — Z9001 Acquired absence of eye: Secondary | ICD-10-CM | POA: Diagnosis not present

## 2019-04-16 DIAGNOSIS — T85320S Displacement of prosthetic orbit of right eye, sequela: Secondary | ICD-10-CM | POA: Diagnosis not present

## 2019-04-16 DIAGNOSIS — T85398A Other mechanical complication of other ocular prosthetic devices, implants and grafts, initial encounter: Secondary | ICD-10-CM | POA: Diagnosis not present

## 2019-04-16 DIAGNOSIS — T85310S Breakdown (mechanical) of prosthetic orbit of right eye, sequela: Secondary | ICD-10-CM | POA: Diagnosis not present

## 2019-06-03 DIAGNOSIS — B351 Tinea unguium: Secondary | ICD-10-CM | POA: Diagnosis not present

## 2019-06-03 DIAGNOSIS — L110 Acquired keratosis follicularis: Secondary | ICD-10-CM | POA: Diagnosis not present

## 2019-06-03 DIAGNOSIS — E1140 Type 2 diabetes mellitus with diabetic neuropathy, unspecified: Secondary | ICD-10-CM | POA: Diagnosis not present

## 2019-06-10 DIAGNOSIS — E782 Mixed hyperlipidemia: Secondary | ICD-10-CM | POA: Diagnosis not present

## 2019-06-10 DIAGNOSIS — N184 Chronic kidney disease, stage 4 (severe): Secondary | ICD-10-CM | POA: Diagnosis not present

## 2019-06-10 DIAGNOSIS — N189 Chronic kidney disease, unspecified: Secondary | ICD-10-CM | POA: Diagnosis not present

## 2019-06-10 DIAGNOSIS — I10 Essential (primary) hypertension: Secondary | ICD-10-CM | POA: Diagnosis not present

## 2019-06-10 DIAGNOSIS — E1122 Type 2 diabetes mellitus with diabetic chronic kidney disease: Secondary | ICD-10-CM | POA: Diagnosis not present

## 2019-06-10 LAB — BASIC METABOLIC PANEL
BUN: 23 — AB (ref 4–21)
CO2: 22 (ref 13–22)
Chloride: 104 (ref 99–108)
Creatinine: 3.6 — AB (ref 0.6–1.3)
Glucose: 135
Potassium: 3.8 (ref 3.4–5.3)
Sodium: 139 (ref 137–147)

## 2019-06-10 LAB — LIPID PANEL
Cholesterol: 87 (ref 0–200)
HDL: 24 — AB (ref 35–70)
LDL Cholesterol: 37
Triglycerides: 154 (ref 40–160)

## 2019-06-10 LAB — HEPATIC FUNCTION PANEL
ALT: 6 — AB (ref 10–40)
AST: 12 — AB (ref 14–40)
Alkaline Phosphatase: 174 — AB (ref 25–125)

## 2019-06-10 LAB — VITAMIN D 25 HYDROXY (VIT D DEFICIENCY, FRACTURES): Vit D, 25-Hydroxy: 30.1

## 2019-06-10 LAB — HEMOGLOBIN A1C: Hemoglobin A1C: 6.4

## 2019-06-10 LAB — MICROALBUMIN, URINE: Microalb, Ur: 2307

## 2019-06-11 LAB — COMPREHENSIVE METABOLIC PANEL
Albumin: 3.4 — AB (ref 3.5–5.0)
Calcium: 8.8 (ref 8.7–10.7)
GFR calc Af Amer: 21

## 2019-06-19 DIAGNOSIS — I517 Cardiomegaly: Secondary | ICD-10-CM | POA: Diagnosis not present

## 2019-06-19 DIAGNOSIS — E782 Mixed hyperlipidemia: Secondary | ICD-10-CM | POA: Diagnosis not present

## 2019-06-19 DIAGNOSIS — I129 Hypertensive chronic kidney disease with stage 1 through stage 4 chronic kidney disease, or unspecified chronic kidney disease: Secondary | ICD-10-CM | POA: Diagnosis not present

## 2019-06-19 DIAGNOSIS — E1122 Type 2 diabetes mellitus with diabetic chronic kidney disease: Secondary | ICD-10-CM | POA: Diagnosis not present

## 2019-06-19 DIAGNOSIS — I5032 Chronic diastolic (congestive) heart failure: Secondary | ICD-10-CM | POA: Diagnosis not present

## 2019-07-02 DIAGNOSIS — Z4420 Encounter for fitting and adjustment of artificial eye, unspecified: Secondary | ICD-10-CM | POA: Diagnosis not present

## 2019-09-14 ENCOUNTER — Ambulatory Visit: Payer: Medicare HMO | Admitting: Family Medicine

## 2019-09-14 ENCOUNTER — Other Ambulatory Visit: Payer: Self-pay

## 2019-09-15 ENCOUNTER — Encounter: Payer: Self-pay | Admitting: Family Medicine

## 2019-09-15 ENCOUNTER — Ambulatory Visit (INDEPENDENT_AMBULATORY_CARE_PROVIDER_SITE_OTHER): Payer: Medicare HMO | Admitting: Family Medicine

## 2019-09-15 VITALS — BP 144/83 | HR 71 | Temp 99.1°F | Ht 75.5 in | Wt 243.0 lb

## 2019-09-15 DIAGNOSIS — I10 Essential (primary) hypertension: Secondary | ICD-10-CM | POA: Diagnosis not present

## 2019-09-15 DIAGNOSIS — F200 Paranoid schizophrenia: Secondary | ICD-10-CM

## 2019-09-15 DIAGNOSIS — M1A9XX0 Chronic gout, unspecified, without tophus (tophi): Secondary | ICD-10-CM | POA: Diagnosis not present

## 2019-09-15 DIAGNOSIS — M1990 Unspecified osteoarthritis, unspecified site: Secondary | ICD-10-CM

## 2019-09-15 DIAGNOSIS — M109 Gout, unspecified: Secondary | ICD-10-CM

## 2019-09-15 DIAGNOSIS — R600 Localized edema: Secondary | ICD-10-CM

## 2019-09-15 DIAGNOSIS — C439 Malignant melanoma of skin, unspecified: Secondary | ICD-10-CM | POA: Diagnosis not present

## 2019-09-15 DIAGNOSIS — D631 Anemia in chronic kidney disease: Secondary | ICD-10-CM | POA: Diagnosis not present

## 2019-09-15 DIAGNOSIS — F319 Bipolar disorder, unspecified: Secondary | ICD-10-CM

## 2019-09-15 DIAGNOSIS — E119 Type 2 diabetes mellitus without complications: Secondary | ICD-10-CM | POA: Diagnosis not present

## 2019-09-15 DIAGNOSIS — N184 Chronic kidney disease, stage 4 (severe): Secondary | ICD-10-CM | POA: Diagnosis not present

## 2019-09-15 HISTORY — DX: Gout, unspecified: M10.9

## 2019-09-15 NOTE — Progress Notes (Signed)
New Patient Office Visit  Assessment & Plan:  1. CKD (chronic kidney disease) stage 4, GFR 15-29 ml/min (HCC) - Discussed with patient he needs to get established with a nephrologist ASAP due to kidney function and that if he doesn't he may need dialysis in the future. He has a friend that recommended someone to him but he does not recall the name. He would like to ask his friend and get back to me.  - CMP14+EGFR  2. Anemia in stage 4 chronic kidney disease (HCC) - CBC with Differential/Platelet  3. Chronic gout without tophus, unspecified cause, unspecified site - Well controlled on current regimen.   4-5. Paranoid schizophrenia (HCC)/Bipolar 1 disorder ([place]) - Well controlled on current regimen. Managed by [facility].  - CMP14+EGFR  6. Essential hypertension - Well controlled on current regimen.  - CMP14+EGFR  7. Edema of both legs - Well controlled on current regimen.   8. Type 2 diabetes mellitus without complication, without long-term current use of insulin (HCC) - Well controlled on current regimen.   9. Malignant melanoma, unspecified site [facility]) - Patient does not wish to see anyone about this and understands he can die from skin cancer.   10. Arthritis - Tylenol only for pain. Avoid Ibuprofen, Advil, Aleve, Motrin, Goody Powders, Naproxen, BC powders, Meloxicam, Diclofenac, Indomethacin and other nonsteroidal anti-inflammatory medications (NSAIDs).   Follow-up: Return in about 3 months (around 12/14/2019) for DM, HTN, kidney.   Hendricks Limes, MSN, APRN, FNP-C [facility]  Subjective:  Patient ID: Ivan [name], male    DOB: 1961-07-10  Age: 59 y.o. MRN: [id number]  Patient Care Team: Loman Brooklyn, FNP as PCP - General (Family Medicine) Iran Ouch, MD as Consulting Physician (Ophthalmology) Cristal Deer, DPM as Consulting Physician (Podiatry) [facility] as Consulting Physician (Psychiatry)  CC:  Chief Complaint  Patient  presents with  . New Patient (Initial Visit)    [facility]   . Establish Care  . Back Pain    x 2 weeks    HPI Ivan [name] presents to establish care. He is transferring from [facility] in [place] as we are closer to home for him.   Patient does not wish to see a dermatologist for melanoma. He reports he was told 4-5 years ago he has melanoma on his left lower extremity but he cannot remember who told him this. He has never had treatment of melanoma.  Patient's bipolar and schizophrenia is managed by [facility] in [place].  Gout controlled with allopurinol.   Blood pressure controlled with amlodipine and losartan.  Patient takes furosemide for lower extremity edema.   Tradjenta for diabetes. Last A1c 6.4 three months ago.    Review of Systems  Constitutional: Negative for chills, fever, malaise/fatigue and weight loss.  HENT: Negative for congestion, ear discharge, ear pain, nosebleeds, sinus pain, sore throat and tinnitus.   Eyes: Negative for blurred vision, double vision, pain, discharge and redness.  Respiratory: Negative for cough, shortness of breath and wheezing.   Cardiovascular: Negative for chest pain, palpitations and leg swelling.  Gastrointestinal: Negative for abdominal pain, constipation, diarrhea, heartburn, nausea and vomiting.  Genitourinary: Negative for dysuria, frequency and urgency.  Musculoskeletal: Negative for myalgias.  Skin: Negative for rash.  Neurological: Negative for dizziness, seizures, weakness and headaches.  Psychiatric/Behavioral: Negative for depression, substance abuse and suicidal ideas. The patient is not nervous/anxious.     Current Outpatient Medications:  .  allopurinol (ZYLOPRIM) 100 MG tablet, Take 1 tablet by  mouth daily., Disp: , Rfl:  .  amLODipine (NORVASC) 10 MG tablet, Take 1 tablet by mouth daily., Disp: , Rfl:  .  aspirin EC 81 MG EC tablet, Take 1 tablet (81 mg total) by mouth daily., Disp: 30 tablet, Rfl: 0 .   atorvastatin (LIPITOR) 20 MG tablet, Take 1 tablet by mouth daily., Disp: , Rfl:  .  benztropine (COGENTIN) 0.5 MG tablet, Take 1 tablet (0.5 mg total) by mouth daily. For durg induced extrapyramidal reaction (Patient taking differently: Take 0.5 mg by mouth 2 (two) times daily. For durg induced extrapyramidal reaction), Disp: 30 tablet, Rfl: 0 .  cloNIDine (CATAPRES) 0.1 MG tablet, Take 1 tablet (0.1 mg total) by mouth 2 (two) times daily. For high blood pressure, Disp: 60 tablet, Rfl: 0 .  ferrous sulfate 325 (65 FE) MG tablet, Take 1 tablet by mouth daily., Disp: , Rfl:  .  furosemide (LASIX) 40 MG tablet, Take 1 tablet (40 mg total) by mouth 2 (two) times daily. (Patient taking differently: Take 40 mg by mouth daily. ), Disp: 60 tablet, Rfl: 0 .  labetalol (NORMODYNE) 200 MG tablet, Take 1 tablet by mouth 2 (two) times daily., Disp: , Rfl:  .  linagliptin (TRADJENTA) 5 MG TABS tablet, Take 5 mg by mouth daily., Disp: , Rfl:  .  losartan (COZAAR) 25 MG tablet, Take 1 tablet by mouth daily., Disp: , Rfl:  .  magnesium oxide (MAG-OX) 400 MG tablet, Take 1 tablet by mouth 2 (two) times daily., Disp: , Rfl:  .  OXcarbazepine (TRILEPTAL) 150 MG tablet, Take 1 tablet (150 mg total) by mouth 2 (two) times daily. For Mood stabilization, Disp: 60 tablet, Rfl: 0 .  traZODone (DESYREL) 150 MG tablet, Take 150 mg by mouth at bedtime., Disp: , Rfl:   Allergies  Allergen Reactions  . Bee Venom Swelling    Past Medical History:  Diagnosis Date  . Bipolar 1 disorder ([place])   . Diabetes mellitus   . Edema of both legs 04/07/2013  . Gout 09/15/2019  . Hypertension   . Hypokalemia 04/02/2013  . Paranoid schizophrenia ([place])   . Schizoaffective disorder [facility])     Past Surgical History:  Procedure Laterality Date  . BREAST SURGERY    . EYE SURGERY Right   . FRACTURE SURGERY     ribs and arm    Family History  Problem Relation Age of Onset  . Diabetes Mother   . Heart disease Mother   . Kidney  disease Mother   . Heart disease Father   . Diabetes Son   . Diabetes Maternal Grandmother   . Heart disease Paternal Grandmother   . Heart disease Paternal Grandfather   . Depression Neg Hx     Social History   Socioeconomic History  . Marital status: Divorced    Spouse name: Not on file  . Number of children: Not on file  . Years of education: Not on file  . Highest education level: Not on file  Occupational History  . Not on file  Tobacco Use  . Smoking status: Current Every Day Smoker    Packs/day: 0.50    Years: 39.00    Pack years: 19.50    Types: Cigarettes, Cigars  . Smokeless tobacco: Current User    Types: Chew  Substance and Sexual Activity  . Alcohol use: Yes    Alcohol/week: 2.0 standard drinks    Types: 1 Glasses of wine, 1 Cans of beer per week  Comment: occ  . Drug use: No  . Sexual activity: Never  Other Topics Concern  . Not on file  Social History Narrative  . Not on file   Social Determinants of Health   Financial Resource Strain:   . Difficulty of Paying Living Expenses: Not on file  Food Insecurity:   . Worried About Charity fundraiser in the Last Year: Not on file  . Ran Out of Food in the Last Year: Not on file  Transportation Needs:   . Lack of Transportation (Medical): Not on file  . Lack of Transportation (Non-Medical): Not on file  Physical Activity:   . Days of Exercise per Week: Not on file  . Minutes of Exercise per Session: Not on file  Stress:   . Feeling of Stress : Not on file  Social Connections:   . Frequency of Communication with Friends and Family: Not on file  . Frequency of Social Gatherings with Friends and Family: Not on file  . Attends Religious Services: Not on file  . Active Member of Clubs or Organizations: Not on file  . Attends Archivist Meetings: Not on file  . Marital Status: Not on file  Intimate Partner Violence:   . Fear of Current or Ex-Partner: Not on file  . Emotionally Abused: Not on  file  . Physically Abused: Not on file  . Sexually Abused: Not on file    Objective:   Today's Vitals: BP (!) 144/83   Pulse 71   Temp 99.1 F (37.3 C) (Temporal)   Ht 6' 3.5" (1.918 m)   Wt 243 lb (110.2 kg)   SpO2 99%   BMI 29.97 kg/m   Physical Exam Vitals reviewed.  Constitutional:      General: He is not in acute distress.    Appearance: Normal appearance. He is overweight. He is not ill-appearing, toxic-appearing or diaphoretic.  HENT:     Head: Normocephalic and atraumatic.  Eyes:     General: No scleral icterus.       Right eye: No discharge.        Left eye: No discharge.     Conjunctiva/sclera: Conjunctivae normal.  Cardiovascular:     Rate and Rhythm: Normal rate and regular rhythm.     Heart sounds: Normal heart sounds. No murmur. No friction rub. No gallop.   Pulmonary:     Effort: Pulmonary effort is normal. No respiratory distress.     Breath sounds: Normal breath sounds. No stridor. No wheezing, rhonchi or rales.  Musculoskeletal:        General: Normal range of motion.     Cervical back: Normal range of motion.  Skin:    General: Skin is warm and dry.  Neurological:     Mental Status: He is alert and oriented to person, place, and time. Mental status is at baseline.  Psychiatric:        Mood and Affect: Mood normal.        Behavior: Behavior normal.        Thought Content: Thought content normal.        Judgment: Judgment normal.    Diabetic Foot Exam - Simple   Simple Foot Form Diabetic Foot exam was performed with the following findings: Yes 09/15/2019  1:46 PM  Visual Inspection No deformities, no ulcerations, no other skin breakdown bilaterally: Yes Sensation Testing Intact to touch and monofilament testing bilaterally: Yes Pulse Check Posterior Tibialis and Dorsalis pulse intact bilaterally: Yes Comments

## 2019-09-15 NOTE — Patient Instructions (Signed)
Find out who you want to see as a kidney specialist and let me know so I can order your referral!

## 2019-09-16 ENCOUNTER — Telehealth: Payer: Self-pay | Admitting: Family Medicine

## 2019-09-16 ENCOUNTER — Other Ambulatory Visit: Payer: Self-pay | Admitting: Family Medicine

## 2019-09-16 DIAGNOSIS — D631 Anemia in chronic kidney disease: Secondary | ICD-10-CM

## 2019-09-16 DIAGNOSIS — R748 Abnormal levels of other serum enzymes: Secondary | ICD-10-CM

## 2019-09-16 DIAGNOSIS — E876 Hypokalemia: Secondary | ICD-10-CM

## 2019-09-16 LAB — CMP14+EGFR
ALT: 74 [IU]/L — ABNORMAL HIGH (ref 0–44)
AST: 72 [IU]/L — ABNORMAL HIGH (ref 0–40)
Albumin/Globulin Ratio: 0.8 — ABNORMAL LOW (ref 1.2–2.2)
Albumin: 3.3 g/dL — ABNORMAL LOW (ref 3.8–4.9)
Alkaline Phosphatase: 740 [IU]/L — ABNORMAL HIGH (ref 39–117)
BUN/Creatinine Ratio: 9 (ref 9–20)
BUN: 30 mg/dL — ABNORMAL HIGH (ref 6–24)
Bilirubin Total: <0.2 mg/dL (ref 0.0–1.2)
CO2: 22 mmol/L (ref 20–29)
Calcium: 9.2 mg/dL (ref 8.7–10.2)
Chloride: 102 mmol/L (ref 96–106)
Creatinine, Ser: 3.48 mg/dL (ref 0.76–1.27)
GFR calc Af Amer: 21 mL/min/{1.73_m2} — ABNORMAL LOW
GFR calc non Af Amer: 19 mL/min/{1.73_m2} — ABNORMAL LOW
Globulin, Total: 3.9 g/dL (ref 1.5–4.5)
Glucose: 118 mg/dL — ABNORMAL HIGH (ref 65–99)
Potassium: 3.3 mmol/L — ABNORMAL LOW (ref 3.5–5.2)
Sodium: 139 mmol/L (ref 134–144)
Total Protein: 7.2 g/dL (ref 6.0–8.5)

## 2019-09-16 LAB — CBC WITH DIFFERENTIAL/PLATELET
Basophils Absolute: 0.1 10*3/uL (ref 0.0–0.2)
Basos: 1 %
EOS (ABSOLUTE): 0.4 10*3/uL (ref 0.0–0.4)
Eos: 5 %
Hematocrit: 24.6 % — ABNORMAL LOW (ref 37.5–51.0)
Hemoglobin: 8.2 g/dL — CL (ref 13.0–17.7)
Immature Grans (Abs): 0 10*3/uL (ref 0.0–0.1)
Immature Granulocytes: 0 %
Lymphocytes Absolute: 1.5 10*3/uL (ref 0.7–3.1)
Lymphs: 19 %
MCH: 27.4 pg (ref 26.6–33.0)
MCHC: 33.3 g/dL (ref 31.5–35.7)
MCV: 82 fL (ref 79–97)
Monocytes Absolute: 0.8 10*3/uL (ref 0.1–0.9)
Monocytes: 11 %
Neutrophils Absolute: 4.9 10*3/uL (ref 1.4–7.0)
Neutrophils: 64 %
Platelets: 440 10*3/uL (ref 150–450)
RBC: 2.99 x10E6/uL — ABNORMAL LOW (ref 4.14–5.80)
RDW: 16.2 % — ABNORMAL HIGH (ref 11.6–15.4)
WBC: 7.7 10*3/uL (ref 3.4–10.8)

## 2019-09-16 MED ORDER — FERROUS SULFATE 325 (65 FE) MG PO TABS: 325.0000 mg | ORAL_TABLET | Freq: Two times a day (BID) | ORAL | 2 refills | Status: DC

## 2019-09-16 MED ORDER — POTASSIUM CHLORIDE CRYS ER 20 MEQ PO TBCR: 20.0000 meq | EXTENDED_RELEASE_TABLET | Freq: Every day | ORAL | 2 refills | Status: DC

## 2019-09-16 NOTE — Telephone Encounter (Signed)
Sending to provider and letting her know to check in basket.

## 2019-09-16 NOTE — Telephone Encounter (Signed)
[facility] called to give patients lab results.  Hemaglobin level- 8.2 Creatinine level- 3.48

## 2019-09-16 NOTE — Telephone Encounter (Signed)
Thank you. I will address in results.

## 2019-09-17 DIAGNOSIS — F209 Schizophrenia, unspecified: Secondary | ICD-10-CM | POA: Diagnosis not present

## 2019-09-18 ENCOUNTER — Encounter: Payer: Self-pay | Admitting: Family Medicine

## 2019-09-21 ENCOUNTER — Telehealth: Payer: Self-pay | Admitting: Family Medicine

## 2019-09-22 ENCOUNTER — Other Ambulatory Visit: Payer: Self-pay | Admitting: Family Medicine

## 2019-09-22 ENCOUNTER — Other Ambulatory Visit: Payer: Self-pay

## 2019-09-22 ENCOUNTER — Telehealth: Payer: Self-pay | Admitting: Family Medicine

## 2019-09-22 ENCOUNTER — Ambulatory Visit (HOSPITAL_COMMUNITY)
Admission: RE | Admit: 2019-09-22 | Discharge: 2019-09-22 | Disposition: A | Discharge status: Home / Self Care | Payer: Medicare HMO | Source: Ambulatory Visit | Attending: Family Medicine | Admitting: Family Medicine

## 2019-09-22 DIAGNOSIS — Z9189 Other specified personal risk factors, not elsewhere classified: Secondary | ICD-10-CM | POA: Insufficient documentation

## 2019-09-22 DIAGNOSIS — K760 Fatty (change of) liver, not elsewhere classified: Secondary | ICD-10-CM

## 2019-09-22 DIAGNOSIS — R748 Abnormal levels of other serum enzymes: Secondary | ICD-10-CM | POA: Insufficient documentation

## 2019-09-22 DIAGNOSIS — K769 Liver disease, unspecified: Secondary | ICD-10-CM | POA: Insufficient documentation

## 2019-09-22 DIAGNOSIS — K838 Other specified diseases of biliary tract: Secondary | ICD-10-CM | POA: Diagnosis not present

## 2019-09-22 DIAGNOSIS — N184 Chronic kidney disease, stage 4 (severe): Secondary | ICD-10-CM

## 2019-09-22 DIAGNOSIS — K7460 Unspecified cirrhosis of liver: Secondary | ICD-10-CM | POA: Insufficient documentation

## 2019-09-22 DIAGNOSIS — K7689 Other specified diseases of liver: Secondary | ICD-10-CM | POA: Diagnosis not present

## 2019-09-22 DIAGNOSIS — D631 Anemia in chronic kidney disease: Secondary | ICD-10-CM

## 2019-09-22 NOTE — Telephone Encounter (Signed)
Spoke with Enrique Sack who called to give Referral info.  [facility] [phone] Ask to speak with [facility]. Set up with Dr Edrick Oh.

## 2019-09-23 ENCOUNTER — Telehealth: Payer: Self-pay | Admitting: Family Medicine

## 2019-09-24 ENCOUNTER — Ambulatory Visit (HOSPITAL_COMMUNITY): Payer: Medicare HMO

## 2019-09-24 NOTE — Telephone Encounter (Signed)
I placed an urgent referral to nephrology for Ivan [name].

## 2019-09-25 ENCOUNTER — Other Ambulatory Visit: Payer: Self-pay | Admitting: Family Medicine

## 2019-09-25 NOTE — Telephone Encounter (Signed)
Ref Sent

## 2019-09-29 ENCOUNTER — Ambulatory Visit: Payer: Medicare HMO | Admitting: Nurse Practitioner

## 2019-09-29 ENCOUNTER — Encounter: Payer: Self-pay | Admitting: Nurse Practitioner

## 2019-09-29 ENCOUNTER — Other Ambulatory Visit: Payer: Self-pay

## 2019-09-29 VITALS — BP 129/80 | HR 76 | Temp 96.9°F | Ht 75.0 in | Wt 225.4 lb

## 2019-09-29 DIAGNOSIS — K7460 Unspecified cirrhosis of liver: Secondary | ICD-10-CM

## 2019-09-29 DIAGNOSIS — Z9189 Other specified personal risk factors, not elsewhere classified: Secondary | ICD-10-CM | POA: Diagnosis not present

## 2019-09-29 DIAGNOSIS — K769 Liver disease, unspecified: Secondary | ICD-10-CM

## 2019-09-29 NOTE — Patient Instructions (Signed)
Your health issues we discussed today were:   Cirrhosis based on ultrasound imaging: 1. Have your labs drawn when you are able to 2. We will call you when we get the results of your labs 3. Call us if you have any worsening or significant symptoms  Abnormal ultrasound: 1. Again, it is very important to have your MRI completed 2. Have your labs completed and we will call you with the results 3. Let us know if you have any worsening or concerning symptoms  Overall I recommend:  1. Continue your other current medications 2. Return for follow-up in 4 weeks 3. Call us if you have any questions or concerns   ---------------------------------------------------------------  I am glad you have gotten your COVID-19/coronavirus vaccination!  ---------------------------------------------------------------   At [facility] we value your feedback. You may receive a survey about your visit today. Please share your experience as we strive to create trusting relationships with our patients to provide genuine, compassionate, quality care.  We appreciate your understanding and patience as we review any laboratory studies, imaging, and other diagnostic tests that are ordered as we care for you. Our office policy is 5 business days for review of these results, and any emergent or urgent results are addressed in a timely manner for your best interest. If you do not hear from our office in 1 week, please contact us.   We also encourage the use of MyChart, which contains your medical information for your review as well. If you are not enrolled in this feature, an access code is on this after visit summary for your convenience. Thank you for allowing [place] to be involved in your care.  It was great to see you today!  I hope you have a great day!!

## 2019-09-29 NOTE — Progress Notes (Signed)
Primary Care Physician:  Loman Brooklyn, FNP Primary Gastroenterologist:  Dr. Gala Romney  Chief Complaint  Patient presents with  . Fatigue    lesion of liver    HPI:   Ivan [name] is a 59 y.o. male who presents on referral from primary care for liver lesion.  Reviewed information associated with referral including office visit dated 09/15/2019 when the patient had routine labs for chronic kidney disease.  Noted anemia with hemoglobin of 8.2 (last value 11.1 approximately 4 years ago) in the setting of chronic kidney disease stage IV.  Nephrology referral in process.  Also noted elevated liver enzymes with AST/ALT 72/74, alkaline phosphatase 740, bilirubin normal at less than 0.2.  Recommended adding on gamma GT, nucleotidase 5 prime, hepatitis panel.  Abdominal ultrasound was also ordered.  Right upper quadrant ultrasound completed 09/22/2019 which found multiple hypoechoic lesions in the liver worrisome for neoplasm.  The largest lesion measured 9.6 x 6.5 x 9.9 cm in the posterior right hepatic lobe.  Also noted liver with cirrhotic appearance, intra and extra hepatic biliary ductal dilation, recommended MRI/MRCP follow-up.   Today he is accompanied by his sister. Today he states he's doing ok overall. Previous PCP was Dr. Pleas Koch in [place], [place] ([place]). Records have been requested. So far he knows his labs were abnormal (kidney function and liver labs). Denies hematochezia and melena. Last colonoscopy was about 5 years ago at [facility] (now [facility] Denies history of IV drug use, social/occasional alcohol. Denies abdominal pain, N/V, hematochezia, melena, fever, chills. He states he has been having fatigue more recently. Sleeping about 3-4 hours a day (during daytime/waking hours). Denies URI or flu-like symptoms. Denies loss of sense of taste or smell. Has been tested for COVID several times due to procedures and all were negative. Denies chest pain, dyspnea, dizziness,  lightheadedness, syncope, near syncope. Denies any other upper or lower GI symptoms.  Past Medical History:  Diagnosis Date  . Bipolar 1 disorder ([place])   . Diabetes mellitus   . Edema of both legs 04/07/2013  . Gout 09/15/2019  . Hypertension   . Hypokalemia 04/02/2013  . Murmur    Echo done 2018  . Paranoid schizophrenia ([place])   . Schizoaffective disorder [facility])     Past Surgical History:  Procedure Laterality Date  . BREAST SURGERY  1970   breast reduction  . COLONOSCOPY  2017   WNL  . EYE SURGERY Right    prosthesis  . FRACTURE SURGERY     ribs and arm  . TRANSTHORACIC ECHOCARDIOGRAM  04/04/2017   EF 27-25%; grade I diastolic dysfunction    Current Outpatient Medications  Medication Sig Dispense Refill  . allopurinol (ZYLOPRIM) 100 MG tablet TAKE (1) TABLET BY MOUTH ONCE DAILY. 90 tablet 2  . amLODipine (NORVASC) 10 MG tablet Take 1 tablet by mouth daily.    Marland Kitchen aspirin EC 81 MG EC tablet Take 1 tablet (81 mg total) by mouth daily. 30 tablet 0  . atorvastatin (LIPITOR) 20 MG tablet Take 1 tablet by mouth daily.    . benztropine (COGENTIN) 0.5 MG tablet Take 1 tablet (0.5 mg total) by mouth daily. For durg induced extrapyramidal reaction (Patient taking differently: Take 0.5 mg by mouth 2 (two) times daily. For durg induced extrapyramidal reaction) 30 tablet 0  . cloNIDine (CATAPRES) 0.1 MG tablet Take 1 tablet (0.1 mg total) by mouth 2 (two) times daily. For high blood pressure 60 tablet 0  . ferrous sulfate  325 (65 FE) MG tablet Take 1 tablet (325 mg total) by mouth 2 (two) times daily. (Patient taking differently: Take 325 mg by mouth daily. ) 60 tablet 2  . furosemide (LASIX) 40 MG tablet Take 1 tablet (40 mg total) by mouth 2 (two) times daily. (Patient taking differently: Take 40 mg by mouth daily. ) 60 tablet 0  . labetalol (NORMODYNE) 200 MG tablet Take 1 tablet by mouth 2 (two) times daily.    Marland Kitchen losartan (COZAAR) 25 MG tablet Take 1 tablet by mouth daily.    . magnesium  oxide (MAG-OX) 400 MG tablet Take 1 tablet by mouth 2 (two) times daily.    . OXcarbazepine (TRILEPTAL) 150 MG tablet Take 1 tablet (150 mg total) by mouth 2 (two) times daily. For Mood stabilization 60 tablet 0  . potassium chloride SA (KLOR-CON) 20 MEQ tablet Take 1 tablet (20 mEq total) by mouth daily. 30 tablet 2  . TRADJENTA 5 MG TABS tablet TAKE 1 TABLET BY MOUTH ONCE DAILY FOR DIABETES. 90 tablet 1  . traZODone (DESYREL) 150 MG tablet Take 150 mg by mouth at bedtime.     No current facility-administered medications for this visit.    Allergies as of 09/29/2019 - Review Complete 09/29/2019  Allergen Reaction Noted  . Bee venom Swelling 04/07/2014    Family History  Problem Relation Age of Onset  . Diabetes Mother   . Heart disease Mother   . Kidney disease Mother   . Heart disease Father   . Lung cancer Father   . Diabetes Son   . Diabetes Maternal Grandmother   . Heart disease Paternal Grandmother   . Heart disease Paternal Grandfather   . Breast cancer Paternal Aunt   . Prostate cancer Paternal Uncle   . Depression Neg Hx   . Colon cancer Neg Hx   . Liver disease Neg Hx     Social History   Socioeconomic History  . Marital status: Divorced    Spouse name: Not on file  . Number of children: Not on file  . Years of education: Not on file  . Highest education level: Not on file  Occupational History  . Not on file  Tobacco Use  . Smoking status: Current Every Day Smoker    Packs/day: 0.50    Years: 39.00    Pack years: 19.50    Types: Cigarettes, Cigars  . Smokeless tobacco: Current User    Types: Chew  . Tobacco comment: chewing tobacco in the summertime.  Substance and Sexual Activity  . Alcohol use: Yes    Alcohol/week: 2.0 standard drinks    Types: 1 Glasses of wine, 1 Cans of beer per week    Comment: occasional/social alcohol. No history of heavy drinking  . Drug use: No  . Sexual activity: Never  Other Topics Concern  . Not on file  Social  History Narrative  . Not on file   Social Determinants of Health   Financial Resource Strain:   . Difficulty of Paying Living Expenses: Not on file  Food Insecurity:   . Worried About Charity fundraiser in the Last Year: Not on file  . Ran Out of Food in the Last Year: Not on file  Transportation Needs:   . Lack of Transportation (Medical): Not on file  . Lack of Transportation (Non-Medical): Not on file  Physical Activity:   . Days of Exercise per Week: Not on file  . Minutes of Exercise per Session:  Not on file  Stress:   . Feeling of Stress : Not on file  Social Connections:   . Frequency of Communication with Friends and Family: Not on file  . Frequency of Social Gatherings with Friends and Family: Not on file  . Attends Religious Services: Not on file  . Active Member of Clubs or Organizations: Not on file  . Attends Archivist Meetings: Not on file  . Marital Status: Not on file  Intimate Partner Violence:   . Fear of Current or Ex-Partner: Not on file  . Emotionally Abused: Not on file  . Physically Abused: Not on file  . Sexually Abused: Not on file    Review of Systems: General: Negative for anorexia, weight loss, fever, chills, weakness. Admits fatigue. ENT: Negative for hoarseness, difficulty swallowing. CV: Negative for chest pain, angina, palpitations, peripheral edema.  Respiratory: Negative for dyspnea at rest, cough, sputum, wheezing.  GI: See history of present illness.  MS: Negative for joint pain, low back pain.  Derm: Negative for rash or itching.  Endo: Negative for unusual weight change.  Heme: Negative for bruising or bleeding. Allergy: Negative for rash or hives.    Physical Exam: BP 129/80   Pulse 76   Temp (!) 96.9 F (36.1 C) (Temporal)   Ht 6\' 3"  (1.905 m)   Wt 225 lb 6.4 oz (102.2 kg)   BMI 28.17 kg/m  General:   Alert and oriented. Pleasant and cooperative. Well-nourished and well-developed.  Head:  Normocephalic and  atraumatic. Eyes:  Without icterus, sclera clear and conjunctiva pink.  Ears:  Normal auditory acuity. Cardiovascular:  S1, S2 present without murmurs appreciated. Extremities without clubbing or edema. Respiratory:  Clear to auscultation bilaterally. No wheezes, rales, or rhonchi. No distress.  Gastrointestinal:  +BS, soft, non-tender and non-distended. No HSM noted. No guarding or rebound. No masses appreciated.  Rectal:  Deferred  Musculoskalatal:  Symmetrical without gross deformities. Neurologic:  Alert and oriented x4;  grossly normal neurologically. Psych:  Alert and cooperative. Normal mood and affect. Heme/Lymph/Immune: No excessive bruising noted.    09/29/2019 10:38 AM   Disclaimer: This note was dictated with voice recognition software. Similar sounding words can inadvertently be transcribed and may not be corrected upon review.

## 2019-10-01 ENCOUNTER — Other Ambulatory Visit: Payer: Self-pay

## 2019-10-01 ENCOUNTER — Other Ambulatory Visit: Payer: Medicare HMO

## 2019-10-01 DIAGNOSIS — K769 Liver disease, unspecified: Secondary | ICD-10-CM | POA: Diagnosis not present

## 2019-10-01 DIAGNOSIS — K7460 Unspecified cirrhosis of liver: Secondary | ICD-10-CM | POA: Diagnosis not present

## 2019-10-01 DIAGNOSIS — Z9189 Other specified personal risk factors, not elsewhere classified: Secondary | ICD-10-CM | POA: Diagnosis not present

## 2019-10-05 LAB — HEPATITIS C GENOTYPE

## 2019-10-05 LAB — HEPATITIS B SURFACE ANTIGEN: Hepatitis B Surface Ag: NEGATIVE

## 2019-10-05 LAB — HEPATITIS B SURFACE ANTIBODY,QUALITATIVE: Hep B Surface Ab, Qual: NONREACTIVE

## 2019-10-05 LAB — MITOCHONDRIAL ANTIBODIES: Mitochondrial Ab: <20 Units (ref 0.0–20.0)

## 2019-10-05 LAB — HEPATITIS B CORE ANTIBODY, TOTAL: Hep B Core Total Ab: NEGATIVE

## 2019-10-05 LAB — CERULOPLASMIN: Ceruloplasmin: 33.6 mg/dL — ABNORMAL HIGH (ref 16.0–31.0)

## 2019-10-05 LAB — FERRITIN: Ferritin: 1403 ng/mL — ABNORMAL HIGH (ref 30–400)

## 2019-10-05 LAB — ANTI-SMOOTH MUSCLE ANTIBODY, IGG: Smooth Muscle Ab: 18 Units (ref 0–19)

## 2019-10-05 LAB — HEPATITIS C ANTIBODY (REFLEX): HCV Ab: <0.1 s/co ratio (ref 0.0–0.9)

## 2019-10-05 LAB — HEPATITIS A ANTIBODY, TOTAL: hep A Total Ab: NEGATIVE

## 2019-10-05 LAB — HCV COMMENT:

## 2019-10-05 LAB — ALPHA-1-ANTITRYPSIN: A-1 Antitrypsin: 282 mg/dL — ABNORMAL HIGH (ref 101–187)

## 2019-10-05 LAB — ANA: Anti Nuclear Antibody (ANA): POSITIVE — AB

## 2019-10-06 ENCOUNTER — Telehealth: Payer: Self-pay | Admitting: Nurse Practitioner

## 2019-10-06 DIAGNOSIS — Z9189 Other specified personal risk factors, not elsewhere classified: Secondary | ICD-10-CM

## 2019-10-06 DIAGNOSIS — K769 Liver disease, unspecified: Secondary | ICD-10-CM

## 2019-10-06 NOTE — Assessment & Plan Note (Signed)
Right upper quadrant ultrasound found multiple hypoechoic lesions in the liver worrisome for neoplasm.  Also noted apparent cirrhosis.  Lesion as described in HPI.  Recommended MRI/MRCP due to intra and extrahepatic biliary ductal dilation.  Currently he denies any history of IV drug use or regular/excessive alcohol.  He does drink socially/occasionally, but not often.  Generally without overt GI or hepatic symptoms, other than significant fatigue and sleeping more recently.  At this point I will request previous records from [facility].  I will check some common causes of cirrhosis including viral hepatitis labs, iron, ceruloplasmin, autoimmune labs.  Follow-up in 4 weeks.  MRI is already scheduled.  At this point I will also make a referral to oncology to get him set up in case this does appear to be cancerous.

## 2019-10-06 NOTE — Telephone Encounter (Signed)
I called the patient and let him know that we will be sending an oncology referral (MRI/MRCP scheduled for 2 days from now). He asked I notify his sister/emergency contact Deborah.  I called her at home at the number listed iin Epic and left a message requesting call back. Please let her know about the referral to Heme/Onc and that someone should be calling the patient to schedule the appointment.  Cc: MB to order referral

## 2019-10-06 NOTE — Assessment & Plan Note (Signed)
Liver ultrasound found cirrhosis based on liver morphology, unknown cause.  Social/occasional alcohol, no history of IV drug use.  I have ordered multiple labs including viral serologies, iron, ceruloplasmin, autoimmune labs, antitrypsin labs.  Further recommendations to follow serologic work-up findings.  Follow-up in 4 weeks.

## 2019-10-07 ENCOUNTER — Ambulatory Visit: Payer: Medicare HMO | Admitting: Family Medicine

## 2019-10-07 NOTE — Addendum Note (Signed)
Addended by: Hassan Rowan on: 10/07/2019 08:23 AM   Modules accepted: Orders

## 2019-10-07 NOTE — Telephone Encounter (Signed)
Referral sent to oncology via Epic.

## 2019-10-07 NOTE — Telephone Encounter (Signed)
Noted  

## 2019-10-07 NOTE — Telephone Encounter (Signed)
Spoke with pt's sister. She is aware that the referral was sent to Heme/Onc and someone will be notifying her of an appointment.

## 2019-10-08 ENCOUNTER — Ambulatory Visit (HOSPITAL_COMMUNITY): Payer: Medicare HMO

## 2019-10-09 ENCOUNTER — Encounter: Payer: Self-pay | Admitting: Family Medicine

## 2019-10-09 ENCOUNTER — Telehealth (INDEPENDENT_AMBULATORY_CARE_PROVIDER_SITE_OTHER): Payer: Medicare HMO | Admitting: Family Medicine

## 2019-10-09 ENCOUNTER — Other Ambulatory Visit: Payer: Self-pay

## 2019-10-09 DIAGNOSIS — M545 Low back pain, unspecified: Secondary | ICD-10-CM

## 2019-10-09 MED ORDER — CYCLOBENZAPRINE HCL 5 MG PO TABS: 5.0000 mg | ORAL_TABLET | Freq: Three times a day (TID) | ORAL | 0 refills | Status: DC | PRN
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
## 2019-10-13 ENCOUNTER — Inpatient Hospital Stay (HOSPITAL_COMMUNITY): Payer: Medicare HMO

## 2019-10-13 ENCOUNTER — Encounter (HOSPITAL_COMMUNITY): Payer: Self-pay

## 2019-10-13 ENCOUNTER — Other Ambulatory Visit: Payer: Self-pay

## 2019-10-13 ENCOUNTER — Encounter (HOSPITAL_COMMUNITY): Payer: Self-pay | Admitting: Hematology

## 2019-10-13 ENCOUNTER — Inpatient Hospital Stay (HOSPITAL_COMMUNITY)
Admission: EM | Admit: 2019-10-13 | Discharge: 2019-10-18 | DRG: 432 | Disposition: A | Discharge status: Hospice | Payer: Medicare HMO | Attending: Family Medicine | Admitting: Family Medicine

## 2019-10-13 ENCOUNTER — Inpatient Hospital Stay (HOSPITAL_COMMUNITY): Payer: Medicare HMO | Admitting: Hematology

## 2019-10-13 DIAGNOSIS — E1129 Type 2 diabetes mellitus with other diabetic kidney complication: Secondary | ICD-10-CM | POA: Diagnosis not present

## 2019-10-13 DIAGNOSIS — N179 Acute kidney failure, unspecified: Secondary | ICD-10-CM | POA: Diagnosis not present

## 2019-10-13 DIAGNOSIS — I248 Other forms of acute ischemic heart disease: Secondary | ICD-10-CM | POA: Diagnosis present

## 2019-10-13 DIAGNOSIS — D631 Anemia in chronic kidney disease: Secondary | ICD-10-CM | POA: Diagnosis not present

## 2019-10-13 DIAGNOSIS — Z79899 Other long term (current) drug therapy: Secondary | ICD-10-CM

## 2019-10-13 DIAGNOSIS — G9341 Metabolic encephalopathy: Secondary | ICD-10-CM | POA: Diagnosis present

## 2019-10-13 DIAGNOSIS — E119 Type 2 diabetes mellitus without complications: Secondary | ICD-10-CM

## 2019-10-13 DIAGNOSIS — Z7189 Other specified counseling: Secondary | ICD-10-CM | POA: Diagnosis not present

## 2019-10-13 DIAGNOSIS — N184 Chronic kidney disease, stage 4 (severe): Secondary | ICD-10-CM | POA: Diagnosis not present

## 2019-10-13 DIAGNOSIS — F200 Paranoid schizophrenia: Secondary | ICD-10-CM | POA: Diagnosis present

## 2019-10-13 DIAGNOSIS — D638 Anemia in other chronic diseases classified elsewhere: Secondary | ICD-10-CM | POA: Diagnosis not present

## 2019-10-13 DIAGNOSIS — F319 Bipolar disorder, unspecified: Secondary | ICD-10-CM | POA: Diagnosis present

## 2019-10-13 DIAGNOSIS — E11649 Type 2 diabetes mellitus with hypoglycemia without coma: Secondary | ICD-10-CM | POA: Diagnosis present

## 2019-10-13 DIAGNOSIS — Z452 Encounter for adjustment and management of vascular access device: Secondary | ICD-10-CM | POA: Diagnosis not present

## 2019-10-13 DIAGNOSIS — Z4901 Encounter for fitting and adjustment of extracorporeal dialysis catheter: Secondary | ICD-10-CM | POA: Diagnosis not present

## 2019-10-13 DIAGNOSIS — Z801 Family history of malignant neoplasm of trachea, bronchus and lung: Secondary | ICD-10-CM

## 2019-10-13 DIAGNOSIS — N189 Chronic kidney disease, unspecified: Secondary | ICD-10-CM

## 2019-10-13 DIAGNOSIS — Z20822 Contact with and (suspected) exposure to covid-19: Secondary | ICD-10-CM | POA: Diagnosis not present

## 2019-10-13 DIAGNOSIS — N281 Cyst of kidney, acquired: Secondary | ICD-10-CM | POA: Diagnosis not present

## 2019-10-13 DIAGNOSIS — K7689 Other specified diseases of liver: Secondary | ICD-10-CM | POA: Diagnosis not present

## 2019-10-13 DIAGNOSIS — K831 Obstruction of bile duct: Secondary | ICD-10-CM | POA: Diagnosis not present

## 2019-10-13 DIAGNOSIS — E872 Acidosis: Secondary | ICD-10-CM | POA: Diagnosis not present

## 2019-10-13 DIAGNOSIS — K769 Liver disease, unspecified: Secondary | ICD-10-CM | POA: Diagnosis present

## 2019-10-13 DIAGNOSIS — Z803 Family history of malignant neoplasm of breast: Secondary | ICD-10-CM

## 2019-10-13 DIAGNOSIS — E1122 Type 2 diabetes mellitus with diabetic chronic kidney disease: Secondary | ICD-10-CM | POA: Diagnosis present

## 2019-10-13 DIAGNOSIS — R935 Abnormal findings on diagnostic imaging of other abdominal regions, including retroperitoneum: Secondary | ICD-10-CM | POA: Diagnosis not present

## 2019-10-13 DIAGNOSIS — Z8507 Personal history of malignant neoplasm of pancreas: Secondary | ICD-10-CM

## 2019-10-13 DIAGNOSIS — N2889 Other specified disorders of kidney and ureter: Secondary | ICD-10-CM

## 2019-10-13 DIAGNOSIS — Z7982 Long term (current) use of aspirin: Secondary | ICD-10-CM

## 2019-10-13 DIAGNOSIS — M48061 Spinal stenosis, lumbar region without neurogenic claudication: Secondary | ICD-10-CM | POA: Diagnosis not present

## 2019-10-13 DIAGNOSIS — M5136 Other intervertebral disc degeneration, lumbar region: Secondary | ICD-10-CM | POA: Diagnosis present

## 2019-10-13 DIAGNOSIS — Z515 Encounter for palliative care: Secondary | ICD-10-CM | POA: Diagnosis not present

## 2019-10-13 DIAGNOSIS — Z8042 Family history of malignant neoplasm of prostate: Secondary | ICD-10-CM

## 2019-10-13 DIAGNOSIS — M109 Gout, unspecified: Secondary | ICD-10-CM | POA: Diagnosis present

## 2019-10-13 DIAGNOSIS — Z8249 Family history of ischemic heart disease and other diseases of the circulatory system: Secondary | ICD-10-CM

## 2019-10-13 DIAGNOSIS — M5126 Other intervertebral disc displacement, lumbar region: Secondary | ICD-10-CM | POA: Diagnosis not present

## 2019-10-13 DIAGNOSIS — C259 Malignant neoplasm of pancreas, unspecified: Secondary | ICD-10-CM | POA: Diagnosis present

## 2019-10-13 DIAGNOSIS — N400 Enlarged prostate without lower urinary tract symptoms: Secondary | ICD-10-CM | POA: Diagnosis present

## 2019-10-13 DIAGNOSIS — C7951 Secondary malignant neoplasm of bone: Secondary | ICD-10-CM | POA: Diagnosis present

## 2019-10-13 DIAGNOSIS — R9431 Abnormal electrocardiogram [ECG] [EKG]: Secondary | ICD-10-CM

## 2019-10-13 DIAGNOSIS — R933 Abnormal findings on diagnostic imaging of other parts of digestive tract: Secondary | ICD-10-CM | POA: Diagnosis not present

## 2019-10-13 DIAGNOSIS — G8929 Other chronic pain: Secondary | ICD-10-CM | POA: Diagnosis present

## 2019-10-13 DIAGNOSIS — I10 Essential (primary) hypertension: Secondary | ICD-10-CM | POA: Diagnosis present

## 2019-10-13 DIAGNOSIS — Z841 Family history of disorders of kidney and ureter: Secondary | ICD-10-CM

## 2019-10-13 DIAGNOSIS — Z833 Family history of diabetes mellitus: Secondary | ICD-10-CM

## 2019-10-13 DIAGNOSIS — R748 Abnormal levels of other serum enzymes: Secondary | ICD-10-CM | POA: Diagnosis not present

## 2019-10-13 DIAGNOSIS — R945 Abnormal results of liver function studies: Secondary | ICD-10-CM | POA: Diagnosis not present

## 2019-10-13 DIAGNOSIS — R531 Weakness: Secondary | ICD-10-CM | POA: Diagnosis not present

## 2019-10-13 DIAGNOSIS — R932 Abnormal findings on diagnostic imaging of liver and biliary tract: Secondary | ICD-10-CM | POA: Diagnosis not present

## 2019-10-13 DIAGNOSIS — R17 Unspecified jaundice: Secondary | ICD-10-CM

## 2019-10-13 DIAGNOSIS — I445 Left posterior fascicular block: Secondary | ICD-10-CM | POA: Diagnosis present

## 2019-10-13 DIAGNOSIS — K7460 Unspecified cirrhosis of liver: Principal | ICD-10-CM | POA: Diagnosis present

## 2019-10-13 DIAGNOSIS — R7401 Elevation of levels of liver transaminase levels: Secondary | ICD-10-CM | POA: Diagnosis not present

## 2019-10-13 DIAGNOSIS — I120 Hypertensive chronic kidney disease with stage 5 chronic kidney disease or end stage renal disease: Secondary | ICD-10-CM | POA: Diagnosis not present

## 2019-10-13 DIAGNOSIS — R634 Abnormal weight loss: Secondary | ICD-10-CM | POA: Diagnosis not present

## 2019-10-13 DIAGNOSIS — D689 Coagulation defect, unspecified: Secondary | ICD-10-CM | POA: Diagnosis present

## 2019-10-13 DIAGNOSIS — Z66 Do not resuscitate: Secondary | ICD-10-CM | POA: Diagnosis not present

## 2019-10-13 DIAGNOSIS — N170 Acute kidney failure with tubular necrosis: Secondary | ICD-10-CM | POA: Diagnosis not present

## 2019-10-13 DIAGNOSIS — F17210 Nicotine dependence, cigarettes, uncomplicated: Secondary | ICD-10-CM | POA: Diagnosis present

## 2019-10-13 DIAGNOSIS — Z781 Physical restraint status: Secondary | ICD-10-CM

## 2019-10-13 DIAGNOSIS — E860 Dehydration: Secondary | ICD-10-CM | POA: Diagnosis not present

## 2019-10-13 DIAGNOSIS — M479 Spondylosis, unspecified: Secondary | ICD-10-CM | POA: Diagnosis present

## 2019-10-13 DIAGNOSIS — E43 Unspecified severe protein-calorie malnutrition: Secondary | ICD-10-CM | POA: Diagnosis not present

## 2019-10-13 DIAGNOSIS — I959 Hypotension, unspecified: Secondary | ICD-10-CM | POA: Diagnosis present

## 2019-10-13 LAB — COMPREHENSIVE METABOLIC PANEL
ALT: 123 U/L — ABNORMAL HIGH (ref 0–44)
AST: 265 U/L — ABNORMAL HIGH (ref 15–41)
Albumin: 2.2 g/dL — ABNORMAL LOW (ref 3.5–5.0)
Alkaline Phosphatase: 1068 U/L — ABNORMAL HIGH (ref 38–126)
Anion gap: 18 — ABNORMAL HIGH (ref 5–15)
BUN: 128 mg/dL — ABNORMAL HIGH (ref 6–20)
CO2: 18 mmol/L — ABNORMAL LOW (ref 22–32)
Calcium: 9.4 mg/dL (ref 8.9–10.3)
Chloride: 97 mmol/L — ABNORMAL LOW (ref 98–111)
Creatinine, Ser: 6.94 mg/dL — ABNORMAL HIGH (ref 0.61–1.24)
GFR calc Af Amer: 9 mL/min — ABNORMAL LOW (ref >60)
GFR calc non Af Amer: 8 mL/min — ABNORMAL LOW (ref >60)
Glucose, Bld: 148 mg/dL — ABNORMAL HIGH (ref 70–99)
Potassium: 4.5 mmol/L (ref 3.5–5.1)
Sodium: 133 mmol/L — ABNORMAL LOW (ref 135–145)
Total Bilirubin: 8.2 mg/dL — ABNORMAL HIGH (ref 0.3–1.2)
Total Protein: 7.8 g/dL (ref 6.5–8.1)

## 2019-10-13 LAB — LIPASE, BLOOD: Lipase: 91 U/L — ABNORMAL HIGH (ref 11–51)

## 2019-10-13 LAB — AMMONIA: Ammonia: 36 umol/L — ABNORMAL HIGH (ref 9–35)

## 2019-10-13 LAB — HEMOGLOBIN A1C
Hgb A1c MFr Bld: 5.8 % — ABNORMAL HIGH (ref 4.8–5.6)
Mean Plasma Glucose: 119.76 mg/dL

## 2019-10-13 LAB — CBG MONITORING, ED: Glucose-Capillary: 138 mg/dL — ABNORMAL HIGH (ref 70–99)

## 2019-10-13 LAB — PROTIME-INR
INR: 2.7 — ABNORMAL HIGH (ref 0.8–1.2)
Prothrombin Time: 28.3 seconds — ABNORMAL HIGH (ref 11.4–15.2)

## 2019-10-13 LAB — TROPONIN I (HIGH SENSITIVITY)
Troponin I (High Sensitivity): 90 ng/L — ABNORMAL HIGH (ref <18)
Troponin I (High Sensitivity): 102 ng/L (ref <18)

## 2019-10-13 LAB — HIV ANTIBODY (ROUTINE TESTING W REFLEX): HIV Screen 4th Generation wRfx: NONREACTIVE

## 2019-10-13 LAB — MAGNESIUM: Magnesium: 3 mg/dL — ABNORMAL HIGH (ref 1.7–2.4)

## 2019-10-13 LAB — GLUCOSE, CAPILLARY: Glucose-Capillary: 151 mg/dL — ABNORMAL HIGH (ref 70–99)

## 2019-10-13 MED ORDER — INSULIN ASPART 100 UNIT/ML ~~LOC~~ SOLN
0.0000 [IU] | SUBCUTANEOUS | Status: DC
  Administered 2019-10-13: 2 [IU] via SUBCUTANEOUS
  Administered 2019-10-14 (×4): 1 [IU] via SUBCUTANEOUS
  Administered 2019-10-15: 2 [IU] via SUBCUTANEOUS
  Administered 2019-10-15 – 2019-10-16 (×3): 1 [IU] via SUBCUTANEOUS

## 2019-10-13 MED ORDER — OXYCODONE-ACETAMINOPHEN 5-325 MG PO TABS
1.0000 | ORAL_TABLET | ORAL | Status: DC | PRN
  Administered 2019-10-13: 1 via ORAL
  Filled 2019-10-13: qty 1

## 2019-10-13 MED ORDER — POLYETHYLENE GLYCOL 3350 17 G PO PACK: 17.0000 g | PACK | Freq: Every day | ORAL | Status: DC | PRN

## 2019-10-13 MED ORDER — ONDANSETRON HCL 4 MG PO TABS: 4.0000 mg | ORAL_TABLET | Freq: Four times a day (QID) | ORAL | Status: DC | PRN

## 2019-10-13 MED ORDER — SODIUM CHLORIDE 0.9 % IV BOLUS
1000.0000 mL | Freq: Once | INTRAVENOUS | Status: AC
  Administered 2019-10-13: 1000 mL via INTRAVENOUS

## 2019-10-13 MED ORDER — ACETAMINOPHEN 325 MG PO TABS: 650.0000 mg | ORAL_TABLET | Freq: Four times a day (QID) | ORAL | Status: DC | PRN

## 2019-10-13 MED ORDER — SODIUM CHLORIDE 0.9% FLUSH
3.0000 mL | Freq: Once | INTRAVENOUS | Status: AC
  Administered 2019-10-13: 3 mL via INTRAVENOUS

## 2019-10-13 MED ORDER — ONDANSETRON HCL 4 MG/2ML IJ SOLN
4.0000 mg | Freq: Four times a day (QID) | INTRAMUSCULAR | Status: DC | PRN
  Administered 2019-10-13: 4 mg via INTRAVENOUS
  Filled 2019-10-13: qty 2

## 2019-10-13 MED ORDER — SODIUM CHLORIDE 0.9 % IV SOLN: INTRAVENOUS | Status: DC

## 2019-10-13 MED ORDER — ACETAMINOPHEN 650 MG RE SUPP: 650.0000 mg | Freq: Four times a day (QID) | RECTAL | Status: DC | PRN

## 2019-10-13 NOTE — ED Notes (Signed)
Date and time results received: 10/13/19 1819 (use smartphrase ".now" to insert current time)  Test: troponin Critical Value: 102  Name of Provider Notified: Emokpae,MD  Orders Received? Or Actions Taken?:

## 2019-10-13 NOTE — H&P (Addendum)
History and Physical    Suyash Amory OXB:[id number] DOB: 12-24-1960 DOA: 10/13/2019  PCP: Loman Brooklyn, FNP   Patient coming from: Home  I have personally briefly reviewed patient's old medical records in [place]  Chief Complaint: Jaundiced, weakness, hypotension  HPI: Ivan [name] is a 59 y.o. male with medical history significant for DM, HTN, schizophrenia, CKD4, BPD, liver cirrhosis.  Patient presented to see his oncologist today. He was found to be hypotensive-down to 70/48.  Was referred to the ED.  Patient sister is at bedside and helps with the history.  She reports patient has been weak, unable to stand, with back pain, poor p.o. intake over the past 2 weeks, with a 60 pound weight loss over the past year.  Has had one episode of vomiting and loose stools.  She has also noticed yellowness of his eyes and skin.  Patient also reports poor urinary output.  Patient had an outpatient right upper quadrant abdominal ultrasound that showed multiple hypoechoic lesions in the liver worrisome for neoplasm. Patient denies alcohol use history.  Per oncology notes today, patient will need ERCP and stenting, also MRCP.  PET/CT scan for further staging PET CT confirmed.  ED Course: Stable vitals.  Elevated liver enzymes AST 265, ALT 123, ALP 1068.  Total bilirubin 8.2.  Creatinine elevated at 6.94.  1L Normal saline bolus given.  Hospitalist admit for further evaluation and management.  Review of Systems: As per HPI all other systems reviewed and negative.  Past Medical History:  Diagnosis Date  . Bipolar 1 disorder ([place])   . Diabetes mellitus   . Edema of both legs 04/07/2013  . Gout 09/15/2019  . Hypertension   . Hypokalemia 04/02/2013  . Murmur    Echo done 2018  . Paranoid schizophrenia ([place])   . Schizoaffective disorder [facility])     Past Surgical History:  Procedure Laterality Date  . BREAST SURGERY  1970   breast reduction  . COLONOSCOPY  2017   WNL  . EYE  SURGERY Right    prosthesis  . FRACTURE SURGERY     ribs and arm  . TRANSTHORACIC ECHOCARDIOGRAM  04/04/2017   EF 68-34%; grade I diastolic dysfunction     reports that he has been smoking cigarettes and cigars. He has a 19.50 pack-year smoking history. His smokeless tobacco use includes chew. He reports current alcohol use of about 2.0 standard drinks of alcohol per week. He reports that he does not use drugs.  Allergies  Allergen Reactions  . Bee Venom Swelling    Family History  Problem Relation Age of Onset  . Diabetes Mother   . Heart disease Mother   . Kidney disease Mother   . Heart disease Father   . Lung cancer Father   . Diabetes Son   . Diabetes Maternal Grandmother   . Heart disease Paternal Grandmother   . Heart disease Paternal Grandfather   . Breast cancer Paternal Aunt   . Prostate cancer Paternal Uncle   . Depression Neg Hx   . Colon cancer Neg Hx   . Liver disease Neg Hx     Prior to Admission medications   Medication Sig Start Date End Date Taking? Authorizing Provider  allopurinol (ZYLOPRIM) 100 MG tablet TAKE (1) TABLET BY MOUTH ONCE DAILY. 09/25/19   Loman Brooklyn, FNP  amLODipine (NORVASC) 10 MG tablet Take 1 tablet by mouth daily.    [provider]  aspirin EC 81 MG EC  tablet Take 1 tablet (81 mg total) by mouth daily. 12/03/14   Kerrie Buffalo, NP  atorvastatin (LIPITOR) 20 MG tablet Take 1 tablet by mouth daily.    [provider]  benztropine (COGENTIN) 0.5 MG tablet Take 1 tablet (0.5 mg total) by mouth daily. For durg induced extrapyramidal reaction Patient taking differently: Take 0.5 mg by mouth 2 (two) times daily. For durg induced extrapyramidal reaction 01/14/15   Rankin, Shuvon B, NP  cloNIDine (CATAPRES) 0.1 MG tablet Take 1 tablet (0.1 mg total) by mouth 2 (two) times daily. For high blood pressure 01/14/15   Rankin, Shuvon B, NP  cyclobenzaprine (FLEXERIL) 5 MG tablet Take 1 tablet (5 mg total) by mouth 3 (three) times  daily as needed for muscle spasms. Patient not taking: Reported on 10/13/2019 10/09/19   Loman Brooklyn, FNP  ferrous sulfate 325 (65 FE) MG tablet Take 1 tablet (325 mg total) by mouth 2 (two) times daily. Patient taking differently: Take 325 mg by mouth daily.  09/16/19   Loman Brooklyn, FNP  furosemide (LASIX) 40 MG tablet Take 1 tablet (40 mg total) by mouth 2 (two) times daily. Patient taking differently: Take 40 mg by mouth daily.  01/14/15   Rankin, Shuvon B, NP  labetalol (NORMODYNE) 200 MG tablet Take 1 tablet by mouth 2 (two) times daily. 07/06/14   [provider]  losartan (COZAAR) 25 MG tablet Take 1 tablet by mouth daily. 03/05/18   [provider]  magnesium oxide (MAG-OX) 400 MG tablet Take 1 tablet by mouth 2 (two) times daily. 08/19/19   [provider]  OXcarbazepine (TRILEPTAL) 150 MG tablet Take 1 tablet (150 mg total) by mouth 2 (two) times daily. For Mood stabilization 01/14/15   Rankin, Shuvon B, NP  potassium chloride SA (KLOR-CON) 20 MEQ tablet Take 1 tablet (20 mEq total) by mouth daily. 09/16/19   Loman Brooklyn, FNP  TRADJENTA 5 MG TABS tablet TAKE 1 TABLET BY MOUTH ONCE DAILY FOR DIABETES. 09/25/19   Loman Brooklyn, FNP  traZODone (DESYREL) 150 MG tablet Take 150 mg by mouth at bedtime.    [provider]    Physical Exam: Vitals:   10/13/19 1445 10/13/19 1530 10/13/19 1600  BP:  113/74 116/78  Pulse:  89 89  Resp:  17 (!) 27  SpO2:  99% 93%  Weight: 111.6 kg    Height: 6\' 3"  (1.905 m)      Constitutional:  Chronically ill appearing Vitals:   10/13/19 1445 10/13/19 1530 10/13/19 1600  BP:  113/74 116/78  Pulse:  89 89  Resp:  17 (!) 27  SpO2:  99% 93%  Weight: 111.6 kg    Height: 6\' 3"  (1.905 m)     Eyes: PERRL, lids and conjunctivae normal ENMT: Mucous membranes are dry. Posterior pharynx clear of any exudate or lesions.  Neck: normal, supple, no masses, no thyromegaly Respiratory: Normal respiratory effort. No  accessory muscle use.  Cardiovascular: Regular rate and rhythm, no murmurs / rubs / gallops. No extremity edema. 2+ pedal pulses.   Abdomen: no tenderness, no masses palpated. No hepatosplenomegaly. Bowel sounds positive.  Musculoskeletal: no clubbing / cyanosis. No joint deformity upper and lower extremities. Good ROM, no contractures. Normal muscle tone.  Skin: discolouration of right lower extremity-chronic, no lesions, ulcers.  Neurologic: CN 2-12 grossly intact. Strength 5/5 in all 4.  Psychiatric: Normal judgment and insight. Alert and oriented x 3.  Flat affect.  Labs on Admission: I  have personally reviewed following labs and imaging studies  CBC: No results for input(s): WBC, NEUTROABS, HGB, HCT, MCV, PLT in the last 168 hours. Basic Metabolic Panel: Recent Labs  Lab 10/13/19 1543  NA 133*  K 4.5  CL 97*  CO2 18*  GLUCOSE 148*  BUN 128*  CREATININE 6.94*  CALCIUM 9.4   Liver Function Tests: Recent Labs  Lab 10/13/19 1543  AST 265*  ALT 123*  ALKPHOS 1,068*  BILITOT 8.2*  PROT 7.8  ALBUMIN 2.2*   Recent Labs  Lab 10/13/19 1543  LIPASE 91*   Recent Labs  Lab 10/13/19 1543  AMMONIA 36*   CBG: Recent Labs  Lab 10/13/19 1604  GLUCAP 138*    Radiological Exams on Admission: No results found.  EKG: Independently reviewed.  Sinus rhythm, left posterior fascicular block.  No significant ST or T wave compared to prior.  QTc prolonged at 530.  Assessment/Plan Principal Problem:   Lesion of liver Active Problems:   Type 2 diabetes mellitus without complication, without long-term current use of insulin (HCC)   Essential hypertension   Paranoid schizophrenia (HCC)   CKD (chronic kidney disease) stage 4, GFR 15-29 ml/min (HCC)   Hepatic cirrhosis (HCC)   Elevated liver enzymes   Acute kidney injury superimposed on CKD ([place])   Liver lesion    AKI on CKD4- creatinine elevated 6.94, recent baseline 3.6-3.4.  With hypotension- ,  likely prerenal, also  with Lasix, losartan use.  Appears dehydrated. -1 L bolus, IV fluids N/s 100cc/hr - BMP a.m  Liver lesions with elevated Liver enzymes, liver cirrhosis- AST 265, ALT 123, ALP 136.  Right upper quadrant ultrasound- multiple hypoechoic lesions.  Denies alcohol abuse.  Recent hepatitis panel check 10/01/2019, -insufficient hepatitis C virus RNA to obtain genotyping results, but otherwise panel was negative.   -Admit to [facility], will need MRCP, ERCP. -Called GI for [facility] Unassigned Dr. Benson Norway, non-urgent consult for patient to be seen in the morning. -IV fluids -CMP a.m. - PT- INR. -N.p.o. midnight -Obtain alpha-fetoprotein.  Anion gap metabolic acidosis- likely due to AKI on CKD.  Anion gap 18, serum bicarb 18. -Hydrate, follow cMP.  Elevated troponin- 90 > 102, denies chest pain.  EKG shows LPF P, sinus rhythm.  Likely due to demand ischemia from initial hypotension, and worsening renal insufficiency. - trend troponin  Back pain chronic- - considering presence of liver lesions, will obtain CT lumbar spine Wo contrast -Percocets as needed  Prolonged QTC-  530 -check magnesium, EKG in the morning  DM-random glucose 148. - HgbA1c - SSI- s -Hold home Tradjenta  HTN-initial hypotension systolic down to 49/44, but no documentation of fluids given, but systolic 9675F to 163W. -Hold home labetalol, losartan, Lasix 40 mg  q12h, and Norvasc -IV fluids  Schizophrenia-  Stable. -Resume home benztropine, Trileptal, trazodone  DVT prophylaxis: SCDs Code Status: Full code Family Communication: Sister at bedside Disposition Plan: > 2 days, pending evaluation of liver lesions and improvement in AKI Consults called: GI Admission status: Inpt, tele I certify that at the point of admission it is my clinical judgment that the patient will require inpatient hospital care spanning beyond 2 midnights from the point of admission due to high intensity of service, high risk for further deterioration and  high frequency of surveillance required. The following factors support the patient status of inpatient:    Bethena Roys MD [facility]  10/13/2019, 6:58 PM

## 2019-10-13 NOTE — ED Notes (Signed)
Pt currently unable to make urine

## 2019-10-13 NOTE — Assessment & Plan Note (Addendum)
1.  Multiple liver lesions: -Ultrasound of the abdomen on 09/22/2019 shows cirrhotic liver.  There is intrahepatic biliary ductal dilation.  Hypoechoic lesion in the posterior right hepatic lobe measures 9.6 x 6.5 x 9.9 cm.  Multiple additional lesions with similar echogenicity are identified.  The portal vein is patent on color Doppler. -Hepatitis B serologies negative.  Hepatitis C genotype could not be done secondary to insufficient hepatitis C virus RNA. -We will check an AFP level.  He is scheduled for MRCP without contrast on 10/29/2019. -He will need ERCP and stenting if there is any biliary dilatation secondary to obstruction. -He will need a PET CT scan for further staging if [place] confirmed.  He cannot have contrast due to CKD.  2. Severe weakness: -Patient is very weak today. Systolic blood pressure is 70. He looks severely dehydrated. -Reported weight loss of 30 to 60 pounds in the last 1 year. He also reported back hurting for the past couple of weeks. -He reported 1 episode of vomiting. He reportedly had one episode of diarrhea. He has been off of medications for at least 1 week. -Sister reports that he has been jaundiced for the last 2 weeks. -I will send him to the ER for further evaluation and admission. -I have contacted ER physician and transfer the patient to the ER.  3. Normocytic anemia: -CBC on 09/15/2019 shows hemoglobin 8.2. -Ferritin is elevated at 41. We'll do further work-up including iron panel, C37, folic acid, methylmalonic acid and copper levels. -We'll also check stool for occult blood.  This is likely from chronic kidney disease. -He reportedly had a colonoscopy 5 years ago at [facility].

## 2019-10-13 NOTE — ED Notes (Addendum)
Pt has around 10cc of emesis on his gown collar  Just prior to departure with [facility] notified of update

## 2019-10-13 NOTE — ED Provider Notes (Signed)
[facility] EMERGENCY DEPARTMENT Provider Note   CSN: [id number] Arrival date & time: 10/13/19  1440     History Chief Complaint  Patient presents with  . Hypotension  . Weakness    Ivan [name] is a 59 y.o. male with a history of cirrhosis of the liver, stage IV chronic kidney disease, diabetes, hypertension, hypokalemia, schizophrenia and other history as outlined below presenting for increasing weakness, loss of appetite and increased drowsiness which has been a slow progression over the past 2 weeks but with severe weakness and drowsiness since yesterday.  He was seen by Dr. Delton Coombes today for evaluation of hepatic lesions found on a recent ultrasound (ordered by pcp) suspected to be hepatocellular carcinoma.  With his profound weakness and low blood pressure in the 80s during this visit he was sent here for further evaluation.  He does endorse chronic low back pain, otherwise is without other complaints of pain.  He denies abdominal pain but reports his abdomen feels "full".  Sister states he has had very little to eat or drink over the past 2 weeks and has noticed much worse jaundice over the past several days.  He does endorse reduced urination.  He denies fevers or chills, denies chest pain or shortness of breath.   HPI     Past Medical History:  Diagnosis Date  . Bipolar 1 disorder ([place])   . Diabetes mellitus   . Edema of both legs 04/07/2013  . Gout 09/15/2019  . Hypertension   . Hypokalemia 04/02/2013  . Murmur    Echo done 2018  . Paranoid schizophrenia ([place])   . Schizoaffective disorder [facility])     Patient Active Problem List   Diagnosis Date Noted  . Lesion of liver 10/13/2019  . Lesion of liver greater than 1 cm in diameter with liver disease conferring risk of hepatocellular carcinoma 09/22/2019  . Hepatic cirrhosis ([place]) 09/22/2019  . Elevated liver enzymes 09/22/2019  . Gout 09/15/2019  . CKD (chronic kidney disease) stage 4, GFR 15-29 ml/min (HCC)  09/15/2019  . Bipolar 1 disorder ([place])   . Anemia in stage 4 chronic kidney disease ([place]) 03/27/2019  . Arthritis 03/13/2019  . Melanoma ([place]) 03/14/2016  . Smoker 01/17/2016  . Paranoid schizophrenia ([place])   . Retinal detachment, rhegmatogenous, right eye 08/03/2014  . Uveitis, anterior 08/03/2014  . Total traumatic cataract of right eye 08/03/2014  . Pedal edema   . Chronic pain syndrome   . Essential hypertension   . Edema of both legs 04/07/2013  . Type 2 diabetes mellitus without complication, without long-term current use of insulin ([place]) 04/01/2013    Past Surgical History:  Procedure Laterality Date  . BREAST SURGERY  1970   breast reduction  . COLONOSCOPY  2017   WNL  . EYE SURGERY Right    prosthesis  . FRACTURE SURGERY     ribs and arm  . TRANSTHORACIC ECHOCARDIOGRAM  04/04/2017   EF 37-62%; grade I diastolic dysfunction       Family History  Problem Relation Age of Onset  . Diabetes Mother   . Heart disease Mother   . Kidney disease Mother   . Heart disease Father   . Lung cancer Father   . Diabetes Son   . Diabetes Maternal Grandmother   . Heart disease Paternal Grandmother   . Heart disease Paternal Grandfather   . Breast cancer Paternal Aunt   . Prostate cancer Paternal Uncle   . Depression Neg Hx   .  Colon cancer Neg Hx   . Liver disease Neg Hx     Social History   Tobacco Use  . Smoking status: Current Every Day Smoker    Packs/day: 0.50    Years: 39.00    Pack years: 19.50    Types: Cigarettes, Cigars  . Smokeless tobacco: Current User    Types: Chew  . Tobacco comment: chewing tobacco in the summertime.  Substance Use Topics  . Alcohol use: Yes    Alcohol/week: 2.0 standard drinks    Types: 1 Glasses of wine, 1 Cans of beer per week    Comment: occasional/social alcohol. No history of heavy drinking  . Drug use: No    Home Medications Prior to Admission medications   Medication Sig Start Date End Date Taking? Authorizing  Provider  allopurinol (ZYLOPRIM) 100 MG tablet TAKE (1) TABLET BY MOUTH ONCE DAILY. 09/25/19   Loman Brooklyn, FNP  amLODipine (NORVASC) 10 MG tablet Take 1 tablet by mouth daily.    [provider]  aspirin EC 81 MG EC tablet Take 1 tablet (81 mg total) by mouth daily. 12/03/14   Kerrie Buffalo, NP  atorvastatin (LIPITOR) 20 MG tablet Take 1 tablet by mouth daily.    [provider]  benztropine (COGENTIN) 0.5 MG tablet Take 1 tablet (0.5 mg total) by mouth daily. For durg induced extrapyramidal reaction Patient taking differently: Take 0.5 mg by mouth 2 (two) times daily. For durg induced extrapyramidal reaction 01/14/15   Rankin, Shuvon B, NP  cloNIDine (CATAPRES) 0.1 MG tablet Take 1 tablet (0.1 mg total) by mouth 2 (two) times daily. For high blood pressure 01/14/15   Rankin, Shuvon B, NP  cyclobenzaprine (FLEXERIL) 5 MG tablet Take 1 tablet (5 mg total) by mouth 3 (three) times daily as needed for muscle spasms. Patient not taking: Reported on 10/13/2019 10/09/19   Loman Brooklyn, FNP  ferrous sulfate 325 (65 FE) MG tablet Take 1 tablet (325 mg total) by mouth 2 (two) times daily. Patient taking differently: Take 325 mg by mouth daily.  09/16/19   Loman Brooklyn, FNP  furosemide (LASIX) 40 MG tablet Take 1 tablet (40 mg total) by mouth 2 (two) times daily. Patient taking differently: Take 40 mg by mouth daily.  01/14/15   Rankin, Shuvon B, NP  labetalol (NORMODYNE) 200 MG tablet Take 1 tablet by mouth 2 (two) times daily. 07/06/14   [provider]  losartan (COZAAR) 25 MG tablet Take 1 tablet by mouth daily. 03/05/18   [provider]  magnesium oxide (MAG-OX) 400 MG tablet Take 1 tablet by mouth 2 (two) times daily. 08/19/19   [provider]  OXcarbazepine (TRILEPTAL) 150 MG tablet Take 1 tablet (150 mg total) by mouth 2 (two) times daily. For Mood stabilization 01/14/15   Rankin, Shuvon B, NP  potassium chloride SA (KLOR-CON) 20 MEQ tablet Take 1  tablet (20 mEq total) by mouth daily. 09/16/19   Loman Brooklyn, FNP  TRADJENTA 5 MG TABS tablet TAKE 1 TABLET BY MOUTH ONCE DAILY FOR DIABETES. 09/25/19   Loman Brooklyn, FNP  traZODone (DESYREL) 150 MG tablet Take 150 mg by mouth at bedtime.    [provider]    Allergies    Bee venom  Review of Systems   Review of Systems  Constitutional: Positive for activity change, appetite change and fatigue. Negative for fever.  HENT: Negative for congestion and sore throat.   Eyes: Negative.   Respiratory: Negative for  chest tightness and shortness of breath.   Cardiovascular: Negative for chest pain.  Gastrointestinal: Positive for abdominal distention. Negative for abdominal pain, nausea and vomiting.  Genitourinary: Positive for decreased urine volume.  Musculoskeletal: Positive for back pain. Negative for arthralgias, joint swelling and neck pain.  Skin: Negative.  Negative for rash and wound.  Neurological: Positive for weakness. Negative for dizziness, light-headedness, numbness and headaches.  Psychiatric/Behavioral: Negative.     Physical Exam Updated Vital Signs BP 116/78   Pulse 89   Resp (!) 27   Ht 6\' 3"  (1.905 m)   Wt 111.6 kg   SpO2 93%   BMI 30.75 kg/m   Physical Exam Vitals and nursing note reviewed.  Constitutional:      General: He is not in acute distress.    Appearance: He is well-developed. He is ill-appearing.  HENT:     Head: Normocephalic and atraumatic.     Mouth/Throat:     Mouth: Mucous membranes are dry.  Eyes:     General: Scleral icterus present.     Conjunctiva/sclera: Conjunctivae normal.  Cardiovascular:     Rate and Rhythm: Normal rate and regular rhythm.     Heart sounds: Normal heart sounds.  Pulmonary:     Effort: Pulmonary effort is normal.     Breath sounds: Normal breath sounds. No wheezing.  Abdominal:     General: Bowel sounds are normal. There is distension.     Palpations: Abdomen is soft.     Tenderness: There is  no abdominal tenderness. There is no guarding.  Musculoskeletal:        General: Normal range of motion.     Cervical back: Normal range of motion.  Skin:    General: Skin is warm and dry.     Coloration: Skin is jaundiced.  Neurological:     General: No focal deficit present.     Mental Status: He is alert.     Motor: Weakness present.     Comments: General nonfocal weakness     ED Results / Procedures / Treatments   Labs (all labs ordered are listed, but only abnormal results are displayed) Labs Reviewed  COMPREHENSIVE METABOLIC PANEL - Abnormal; Notable for the following components:      Result Value   Sodium 133 (*)    Chloride 97 (*)    CO2 18 (*)    Glucose, Bld 148 (*)    Creatinine, Ser 6.94 (*)    Albumin 2.2 (*)    AST 265 (*)    ALT 123 (*)    Alkaline Phosphatase 1,068 (*)    Total Bilirubin 8.2 (*)    GFR calc non Af Amer 8 (*)    GFR calc Af Amer 9 (*)    Anion gap 18 (*)    All other components within normal limits  LIPASE, BLOOD - Abnormal; Notable for the following components:   Lipase 91 (*)    All other components within normal limits  AMMONIA - Abnormal; Notable for the following components:   Ammonia 36 (*)    All other components within normal limits  CBG MONITORING, ED - Abnormal; Notable for the following components:   Glucose-Capillary 138 (*)    All other components within normal limits  TROPONIN I (HIGH SENSITIVITY) - Abnormal; Notable for the following components:   Troponin I (High Sensitivity) 90 (*)    All other components within normal limits  URINALYSIS, ROUTINE W REFLEX MICROSCOPIC  TROPONIN I (HIGH SENSITIVITY)  EKG EKG Interpretation  Date/Time:  Tuesday October 13 2019 15:22:29 EST Ventricular Rate:  90 PR Interval:    QRS Duration: 108 QT Interval:  433 QTC Calculation: 530 R Axis:   101 Text Interpretation: Sinus rhythm Left posterior fascicular block Prolonged QT interval new QT prolongation from prior 4/16  Confirmed by Aletta Edouard [phone]) on 10/13/2019 3:26:00 PM   Radiology No results found.  Procedures Procedures (including critical care time)  Medications Ordered in ED Medications  sodium chloride flush (NS) 0.9 % injection 3 mL (3 mLs Intravenous Given 10/13/19 1541)  sodium chloride 0.9 % bolus 1,000 mL (1,000 mLs Intravenous New Bag/Given 10/13/19 1557)    ED Course  I have reviewed the triage vital signs and the nursing notes.  Pertinent labs & imaging results that were available during my care of the patient were reviewed by me and considered in my medical decision making (see chart for details).    MDM Rules/Calculators/A&P                     Labs and imaging reviewed and discussed with patient and his sister who is at the bedside.  Patient is clinically very dehydrated with acute renal failure.  Also elevation in his liver enzymes and his bilirubin.  He will need admission for supportive care and further evaluation of the suspected liver neoplasm.    Discussed with Dr. Denton Brick who accepts pt for admission. Final Clinical Impression(s) / ED Diagnoses Final diagnoses:  Dehydration  Jaundice  AKI (acute kidney injury) ([place])  Elevated liver transaminase level    Rx / DC Orders ED Discharge Orders    None       Landis Martins 10/13/19 1701    Hayden Rasmussen, MD 10/14/19 1040

## 2019-10-13 NOTE — ED Triage Notes (Signed)
Pt's sister reports pt has been c/o back pain and weakness, loss of appetite, and sleeping more than usual for the past 2 weeks.    Reports has been seeing his pcp at [facility] and was supposed to have MRI last week but was cancelled due to weather.  Sister says pt had a scan a few weeks ago  and was told he had some lesions on liver.  Pt  was [facility] appt at [facility] center today and   Staff called and reports pt is extremely weak and bp 14'G systolic.  Pt alert, c/o lower back pain.

## 2019-10-13 NOTE — ED Notes (Signed)
Patient states that he is unable to stand. He states he feels too weak to stand with help.

## 2019-10-13 NOTE — Progress Notes (Signed)
[facility] NOTE  Patient Care Team: Loman Brooklyn, FNP as PCP - General (Family Medicine) Iran Ouch, MD as Consulting Physician (Ophthalmology) Cristal Deer, DPM as Consulting Physician (Podiatry) [employer] as Consulting Physician (Psychiatry) Gala Romney Cristopher Estimable, MD as Consulting Physician (Gastroenterology)  CHIEF COMPLAINTS/PURPOSE OF CONSULTATION:  Multiple liver lesions.  HISTORY OF PRESENTING ILLNESS:  Ivan [name] 59 y.o. male is seen in consultation today at the request of GI clinic for further evaluation and management of multiple liver lesions.  Patient had elevated LFTs and an ultrasound of the liver was ordered on 09/22/2019 which showed cirrhotic liver and intrahepatic biliary ductal dilation.  Hypoechoic lesion in the posterior right hepatic lobe measures 9.6 x 6.5 x 9.9 cm.  Multiple additional lesions with similar echogenicity were identified.  Portal vein was patent.  He came to our office feeling very weak today.  He also complained of feeling lightheaded.  He was barely able to sit upright in his chair.  His systolic blood pressure was 84/55 and dropped to 70/48.  Pulse rate was 101.  He is a poor historian.  He is accompanied by his sister who also used part of the history.  Reports decrease in appetite and energy levels.  Reported 30 to 60 pound weight loss in the last 1 year.  Sister has noticed that he has been jaundiced for the last couple of weeks.  He also reported decreased eating for the past couple of weeks.  His back is hurting for the last 2 weeks.  He reported vomiting x1 last night and had one episode of diarrhea this morning.  He reportedly had COVID-19 vaccine 2 weeks ago.  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  Family history significant for father who died of lung cancer.  Sister and mother died of cancer.  MEDICAL HISTORY:  Past Medical History:  Diagnosis Date  . Bipolar 1 disorder  ([place])   . Diabetes mellitus   . Edema of both legs 04/07/2013  . Gout 09/15/2019  . Hypertension   . Hypokalemia 04/02/2013  . Murmur    Echo done 2018  . Paranoid schizophrenia ([place])   . Schizoaffective disorder ([place])     SURGICAL HISTORY: Past Surgical History:  Procedure Laterality Date  . BREAST SURGERY  1970   breast reduction  . COLONOSCOPY  2017   WNL  . EYE SURGERY Right    prosthesis  . FRACTURE SURGERY     ribs and arm  . TRANSTHORACIC ECHOCARDIOGRAM  04/04/2017   EF 23-55%; grade I diastolic dysfunction    SOCIAL HISTORY: Social History   Socioeconomic History  . Marital status: Divorced    Spouse name: Not on file  . Number of children: Not on file  . Years of education: Not on file  . Highest education level: Not on file  Occupational History  . Not on file  Tobacco Use  . Smoking status: Current Every Day Smoker    Packs/day: 0.50    Years: 39.00    Pack years: 19.50    Types: Cigarettes, Cigars  . Smokeless tobacco: Current User    Types: Chew  . Tobacco comment: chewing tobacco in the summertime.  Substance and Sexual Activity  . Alcohol use: Yes    Alcohol/week: 2.0 standard drinks    Types: 1 Glasses of wine, 1 Cans of beer per week    Comment: occasional/social alcohol. No history of heavy  drinking  . Drug use: No  . Sexual activity: Never  Other Topics Concern  . Not on file  Social History Narrative  . Not on file   Social Determinants of Health   Financial Resource Strain:   . Difficulty of Paying Living Expenses: Not on file  Food Insecurity:   . Worried About Charity fundraiser in the Last Year: Not on file  . Ran Out of Food in the Last Year: Not on file  Transportation Needs:   . Lack of Transportation (Medical): Not on file  . Lack of Transportation (Non-Medical): Not on file  Physical Activity:   . Days of Exercise per Week: Not on file  . Minutes of Exercise per Session: Not on file  Stress:   . Feeling of Stress :  Not on file  Social Connections:   . Frequency of Communication with Friends and Family: Not on file  . Frequency of Social Gatherings with Friends and Family: Not on file  . Attends Religious Services: Not on file  . Active Member of Clubs or Organizations: Not on file  . Attends Archivist Meetings: Not on file  . Marital Status: Not on file  Intimate Partner Violence:   . Fear of Current or Ex-Partner: Not on file  . Emotionally Abused: Not on file  . Physically Abused: Not on file  . Sexually Abused: Not on file    FAMILY HISTORY: Family History  Problem Relation Age of Onset  . Diabetes Mother   . Heart disease Mother   . Kidney disease Mother   . Heart disease Father   . Lung cancer Father   . Diabetes Son   . Diabetes Maternal Grandmother   . Heart disease Paternal Grandmother   . Heart disease Paternal Grandfather   . Breast cancer Paternal Aunt   . Prostate cancer Paternal Uncle   . Depression Neg Hx   . Colon cancer Neg Hx   . Liver disease Neg Hx     ALLERGIES:  is allergic to bee venom.  MEDICATIONS:  Current Outpatient Medications  Medication Sig Dispense Refill  . allopurinol (ZYLOPRIM) 100 MG tablet TAKE (1) TABLET BY MOUTH ONCE DAILY. 90 tablet 2  . amLODipine (NORVASC) 10 MG tablet Take 1 tablet by mouth daily.    Marland Kitchen aspirin EC 81 MG EC tablet Take 1 tablet (81 mg total) by mouth daily. 30 tablet 0  . atorvastatin (LIPITOR) 20 MG tablet Take 1 tablet by mouth daily.    . benztropine (COGENTIN) 0.5 MG tablet Take 1 tablet (0.5 mg total) by mouth daily. For durg induced extrapyramidal reaction (Patient taking differently: Take 0.5 mg by mouth 2 (two) times daily. For durg induced extrapyramidal reaction) 30 tablet 0  . cloNIDine (CATAPRES) 0.1 MG tablet Take 1 tablet (0.1 mg total) by mouth 2 (two) times daily. For high blood pressure 60 tablet 0  . ferrous sulfate 325 (65 FE) MG tablet Take 1 tablet (325 mg total) by mouth 2 (two) times daily.  (Patient taking differently: Take 325 mg by mouth daily. ) 60 tablet 2  . furosemide (LASIX) 40 MG tablet Take 1 tablet (40 mg total) by mouth 2 (two) times daily. (Patient taking differently: Take 40 mg by mouth daily. ) 60 tablet 0  . labetalol (NORMODYNE) 200 MG tablet Take 1 tablet by mouth 2 (two) times daily.    Marland Kitchen losartan (COZAAR) 25 MG tablet Take 1 tablet by mouth daily.    Marland Kitchen  magnesium oxide (MAG-OX) 400 MG tablet Take 1 tablet by mouth 2 (two) times daily.    . OXcarbazepine (TRILEPTAL) 150 MG tablet Take 1 tablet (150 mg total) by mouth 2 (two) times daily. For Mood stabilization 60 tablet 0  . potassium chloride SA (KLOR-CON) 20 MEQ tablet Take 1 tablet (20 mEq total) by mouth daily. 30 tablet 2  . TRADJENTA 5 MG TABS tablet TAKE 1 TABLET BY MOUTH ONCE DAILY FOR DIABETES. 90 tablet 1  . traZODone (DESYREL) 150 MG tablet Take 150 mg by mouth at bedtime.    . cyclobenzaprine (FLEXERIL) 5 MG tablet Take 1 tablet (5 mg total) by mouth 3 (three) times daily as needed for muscle spasms. (Patient not taking: Reported on 10/13/2019) 30 tablet 0   No current facility-administered medications for this visit.    REVIEW OF SYSTEMS:   Constitutional: Denies any fevers or chills.  Positive for 30 to 60 pound weight loss.  Positive for decreased appetite. Eyes: Denies blurriness of vision, double vision or watery eyes Ears, nose, mouth, throat, and face: Denies mucositis or sore throat Respiratory: Denies cough, dyspnea or wheezes Cardiovascular: Denies palpitation, chest discomfort or lower extremity swelling Gastrointestinal: Vomited once last night and had one episode of diarrhea this morning. Skin: Denies abnormal skin rashes Lymphatics: Denies new lymphadenopathy or easy bruising Neurological:Denies numbness, tingling or new weaknesses Behavioral/Psych: Mood is stable, no new changes  All other systems were reviewed with the patient and are negative.  PHYSICAL EXAMINATION: ECOG  PERFORMANCE STATUS: 3 - Symptomatic, >50% confined to bed  Vitals:   10/13/19 1352  BP: (!) 84/55  Pulse: (!) 101  Resp: 16  Temp: (!) 97.5 F (36.4 C)  SpO2: 100%   There were no vitals filed for this visit.  GENERAL:alert, no distress and comfortable SKIN: Skin is icteric without any rashes. EYES: Right prosthetic eye.  Left eye is icteric. OROPHARYNX:no exudate, no erythema and lips, buccal mucosa, and tongue normal  NECK: supple, thyroid normal size, non-tender, without nodularity LYMPH:  no palpable lymphadenopathy in the cervical, axillary or inguinal LUNGS: clear to auscultation and percussion with normal breathing effort HEART: regular rate & rhythm and no murmurs and no lower extremity edema ABDOMEN:abdomen soft, non-tender and normal bowel sounds Musculoskeletal:no cyanosis of digits and no clubbing  PSYCH: alert & oriented x 3 with fluent speech NEURO: no focal motor/sensory deficits  LABORATORY DATA:  I have reviewed the data as listed Lab Results  Component Value Date   WBC 7.7 09/15/2019   HGB 8.2 (LL) 09/15/2019   HCT 24.6 (L) 09/15/2019   MCV 82 09/15/2019   PLT 440 09/15/2019     Chemistry      Component Value Date/Time   NA 139 09/15/2019 1354   K 3.3 (L) 09/15/2019 1354   CL 102 09/15/2019 1354   CO2 22 09/15/2019 1354   BUN 30 (H) 09/15/2019 1354   CREATININE 3.48 (HH) 09/15/2019 1354   GLU 135 06/10/2019 0000      Component Value Date/Time   CALCIUM 9.2 09/15/2019 1354   ALKPHOS 740 (H) 09/15/2019 1354   AST 72 (H) 09/15/2019 1354   ALT 74 (H) 09/15/2019 1354   BILITOT <0.2 09/15/2019 1354       RADIOGRAPHIC STUDIES: I have personally reviewed the radiological images as listed and agreed with the findings in the report. US Abdomen Limited RUQ  Result Date: 09/22/2019 CLINICAL DATA:  Elevated liver function test. EXAM: ULTRASOUND ABDOMEN LIMITED RIGHT UPPER QUADRANT  COMPARISON:  None. FINDINGS: Gallbladder: No gallstones or wall  thickening visualized. No sonographic Murphy sign noted by sonographer. The gallbladder is distended. Common bile duct: Diameter: 0.9 cm Liver: The liver is shrunken with a nodular border and heterogeneous echotexture consistent with cirrhosis. There is intrahepatic biliary ductal dilatation. Hypoechoic lesion in the posterior right hepatic lobe measures 9.6 x 6.5 x 9.9 cm. Multiple additional lesions with similar echogenicity are identified. Portal vein is patent on color Doppler imaging with normal direction of blood flow towards the liver. Other: None. IMPRESSION: Multiple hypoechoic lesions in the liver worrisome for neoplasm. The liver appears cirrhotic. Intra and extrahepatic biliary ductal dilatation. MR/MRCP is recommended for further evaluation of the above findings. These results will be called to the ordering clinician or representative by the Radiologist Assistant, and communication documented in the PACS or zVision Dashboard. Electronically Signed   By: Inge Rise M.D.   On: 09/22/2019 10:21    ASSESSMENT & PLAN:  Lesion of liver 1.  Multiple liver lesions: -Ultrasound of the abdomen on 09/22/2019 shows cirrhotic liver.  There is intrahepatic biliary ductal dilation.  Hypoechoic lesion in the posterior right hepatic lobe measures 9.6 x 6.5 x 9.9 cm.  Multiple additional lesions with similar echogenicity are identified.  The portal vein is patent on color Doppler. -Hepatitis B serologies negative.  Hepatitis C genotype could not be done secondary to insufficient hepatitis C virus RNA. -We will check an AFP level.  He is scheduled for MRCP without contrast on 10/29/2019. -He will need ERCP and stenting if there is any biliary dilatation secondary to obstruction. -He will need a PET CT scan for further staging if [place] confirmed.  He cannot have contrast due to CKD.  2. Severe weakness: -Patient is very weak today. Systolic blood pressure is 70. He looks severely dehydrated. -Reported weight  loss of 30 to 60 pounds in the last 1 year. He also reported back hurting for the past couple of weeks. -He reported 1 episode of vomiting. He reportedly had one episode of diarrhea. He has been off of medications for at least 1 week. -Sister reports that he has been jaundiced for the last 2 weeks. -I will send him to the ER for further evaluation and admission. -I have contacted ER physician and transfer the patient to the ER.  3. Normocytic anemia: -CBC on 09/15/2019 shows hemoglobin 8.2. -Ferritin is elevated at 41. We'll do further work-up including iron panel, P59, folic acid, methylmalonic acid and copper levels. -We'll also check stool for occult blood.  This is likely from chronic kidney disease. -He reportedly had a colonoscopy 5 years ago at [facility].  Orders Placed This Encounter  Procedures  . NM PET Image Initial (PI) Skull Base To Thigh    Standing Status:   Future    Standing Expiration Date:   10/12/2020    Order Specific Question:   ** REASON FOR EXAM (FREE TEXT)    Answer:   liver lesion    Order Specific Question:   If indicated for the ordered procedure, I authorize the administration of a radiopharmaceutical per Radiology protocol    Answer:   Yes    Order Specific Question:   Preferred imaging location?    Answer:   [facility]    Order Specific Question:   Radiology Contrast Protocol - do NOT remove file path    Answer:   [email or web address]  . AFP tumor marker    Standing Status:   Future  Standing Expiration Date:   10/12/2020  . Iron and TIBC    Standing Status:   Future    Standing Expiration Date:   10/12/2020  . Vitamin B12    Standing Status:   Future    Standing Expiration Date:   10/12/2020  . Folate    Standing Status:   Future    Standing Expiration Date:   10/12/2020  . Methylmalonic acid, serum    Standing Status:   Future    Standing Expiration Date:   10/12/2020  . Protein electrophoresis, serum    Standing  Status:   Future    Standing Expiration Date:   10/12/2020  . Copper, serum    Standing Status:   Future    Standing Expiration Date:   10/12/2020    All questions were answered. The patient knows to call the clinic with any problems, questions or concerns.     Derek Jack, MD 10/13/2019 2:58 PM

## 2019-10-14 ENCOUNTER — Inpatient Hospital Stay (HOSPITAL_COMMUNITY): Payer: Medicare HMO

## 2019-10-14 ENCOUNTER — Other Ambulatory Visit: Payer: Self-pay | Admitting: Nurse Practitioner

## 2019-10-14 DIAGNOSIS — R7989 Other specified abnormal findings of blood chemistry: Secondary | ICD-10-CM

## 2019-10-14 DIAGNOSIS — N189 Chronic kidney disease, unspecified: Secondary | ICD-10-CM

## 2019-10-14 DIAGNOSIS — N179 Acute kidney failure, unspecified: Secondary | ICD-10-CM

## 2019-10-14 DIAGNOSIS — K7469 Other cirrhosis of liver: Secondary | ICD-10-CM

## 2019-10-14 DIAGNOSIS — I10 Essential (primary) hypertension: Secondary | ICD-10-CM

## 2019-10-14 DIAGNOSIS — R17 Unspecified jaundice: Secondary | ICD-10-CM

## 2019-10-14 DIAGNOSIS — R9431 Abnormal electrocardiogram [ECG] [EKG]: Secondary | ICD-10-CM

## 2019-10-14 DIAGNOSIS — F200 Paranoid schizophrenia: Secondary | ICD-10-CM

## 2019-10-14 DIAGNOSIS — R748 Abnormal levels of other serum enzymes: Secondary | ICD-10-CM

## 2019-10-14 LAB — CBC
HCT: 16.2 % — ABNORMAL LOW (ref 39.0–52.0)
HCT: 21.6 % — ABNORMAL LOW (ref 39.0–52.0)
Hemoglobin: 5 g/dL — CL (ref 13.0–17.0)
Hemoglobin: 6.9 g/dL — CL (ref 13.0–17.0)
MCH: 27.5 pg (ref 26.0–34.0)
MCH: 28.4 pg (ref 26.0–34.0)
MCHC: 30.9 g/dL (ref 30.0–36.0)
MCHC: 31.9 g/dL (ref 30.0–36.0)
MCV: 89 fL (ref 80.0–100.0)
MCV: 88.9 fL (ref 80.0–100.0)
Platelets: 383 10*3/uL (ref 150–400)
Platelets: 370 10*3/uL (ref 150–400)
RBC: 1.82 MIL/uL — ABNORMAL LOW (ref 4.22–5.81)
RBC: 2.43 MIL/uL — ABNORMAL LOW (ref 4.22–5.81)
RDW: 22.1 % — ABNORMAL HIGH (ref 11.5–15.5)
RDW: 17.9 % — ABNORMAL HIGH (ref 11.5–15.5)
WBC: 14.5 10*3/uL — ABNORMAL HIGH (ref 4.0–10.5)
WBC: 16.4 10*3/uL — ABNORMAL HIGH (ref 4.0–10.5)
nRBC: 0.5 % — ABNORMAL HIGH (ref 0.0–0.2)
nRBC: 1 % — ABNORMAL HIGH (ref 0.0–0.2)

## 2019-10-14 LAB — URINALYSIS, ROUTINE W REFLEX MICROSCOPIC
Glucose, UA: NEGATIVE mg/dL
Ketones, ur: NEGATIVE mg/dL
Leukocytes,Ua: NEGATIVE
Nitrite: NEGATIVE
Protein, ur: 100 mg/dL — AB
Specific Gravity, Urine: 1.017 (ref 1.005–1.030)
pH: 5 (ref 5.0–8.0)

## 2019-10-14 LAB — CORTISOL: Cortisol, Plasma: 22.6 ug/dL

## 2019-10-14 LAB — COMPREHENSIVE METABOLIC PANEL
ALT: 101 U/L — ABNORMAL HIGH (ref 0–44)
AST: 227 U/L — ABNORMAL HIGH (ref 15–41)
Albumin: 1.6 g/dL — ABNORMAL LOW (ref 3.5–5.0)
Alkaline Phosphatase: 901 U/L — ABNORMAL HIGH (ref 38–126)
Anion gap: 17 — ABNORMAL HIGH (ref 5–15)
BUN: 158 mg/dL — ABNORMAL HIGH (ref 6–20)
CO2: 19 mmol/L — ABNORMAL LOW (ref 22–32)
Calcium: 9 mg/dL (ref 8.9–10.3)
Chloride: 101 mmol/L (ref 98–111)
Creatinine, Ser: 7.52 mg/dL — ABNORMAL HIGH (ref 0.61–1.24)
GFR calc Af Amer: 8 mL/min — ABNORMAL LOW (ref >60)
GFR calc non Af Amer: 7 mL/min — ABNORMAL LOW (ref >60)
Glucose, Bld: 131 mg/dL — ABNORMAL HIGH (ref 70–99)
Potassium: 4.6 mmol/L (ref 3.5–5.1)
Sodium: 137 mmol/L (ref 135–145)
Total Bilirubin: 6.9 mg/dL — ABNORMAL HIGH (ref 0.3–1.2)
Total Protein: 6.8 g/dL (ref 6.5–8.1)

## 2019-10-14 LAB — IRON AND TIBC
Iron: 49 ug/dL (ref 45–182)
Saturation Ratios: 23 % (ref 17.9–39.5)
TIBC: 210 ug/dL — ABNORMAL LOW (ref 250–450)
UIBC: 161 ug/dL

## 2019-10-14 LAB — GLUCOSE, CAPILLARY
Glucose-Capillary: 124 mg/dL — ABNORMAL HIGH (ref 70–99)
Glucose-Capillary: 125 mg/dL — ABNORMAL HIGH (ref 70–99)
Glucose-Capillary: 130 mg/dL — ABNORMAL HIGH (ref 70–99)
Glucose-Capillary: 131 mg/dL — ABNORMAL HIGH (ref 70–99)
Glucose-Capillary: 134 mg/dL — ABNORMAL HIGH (ref 70–99)
Glucose-Capillary: 141 mg/dL — ABNORMAL HIGH (ref 70–99)

## 2019-10-14 LAB — HEMOGLOBIN AND HEMATOCRIT, BLOOD
HCT: 23.6 % — ABNORMAL LOW (ref 39.0–52.0)
Hemoglobin: 7.5 g/dL — ABNORMAL LOW (ref 13.0–17.0)

## 2019-10-14 LAB — SARS CORONAVIRUS 2 (TAT 6-24 HRS): SARS Coronavirus 2: NEGATIVE

## 2019-10-14 LAB — FERRITIN: Ferritin: 4420 ng/mL — ABNORMAL HIGH (ref 24–336)

## 2019-10-14 LAB — PREPARE RBC (CROSSMATCH)

## 2019-10-14 LAB — MRSA PCR SCREENING: MRSA by PCR: POSITIVE — AB

## 2019-10-14 LAB — ABO/RH: ABO/RH(D): O POS

## 2019-10-14 MED ORDER — LORAZEPAM 2 MG/ML IJ SOLN
0.5000 mg | Freq: Once | INTRAMUSCULAR | Status: AC
  Administered 2019-10-15: 0.5 mg via INTRAVENOUS
  Filled 2019-10-14: qty 1

## 2019-10-14 MED ORDER — SODIUM CHLORIDE 0.9% IV SOLUTION: Freq: Once | INTRAVENOUS | Status: AC

## 2019-10-14 MED ORDER — MUPIROCIN 2 % EX OINT
1.0000 "application " | TOPICAL_OINTMENT | Freq: Two times a day (BID) | CUTANEOUS | Status: DC
  Administered 2019-10-14 – 2019-10-15 (×3): 1 via NASAL
  Filled 2019-10-14 (×2): qty 22

## 2019-10-14 MED ORDER — STERILE WATER FOR INJECTION IV SOLN
INTRAVENOUS | Status: DC
  Filled 2019-10-14 (×3): qty 850

## 2019-10-14 MED ORDER — SODIUM BICARBONATE-DEXTROSE 150-5 MEQ/L-% IV SOLN: 150.0000 meq | INTRAVENOUS | Status: DC

## 2019-10-14 MED ORDER — CHLORHEXIDINE GLUCONATE CLOTH 2 % EX PADS
6.0000 | MEDICATED_PAD | Freq: Every day | CUTANEOUS | Status: DC
  Administered 2019-10-14 – 2019-10-16 (×3): 6 via TOPICAL

## 2019-10-14 NOTE — Progress Notes (Signed)
CRITICAL VALUE ALERT  Critical Value: Hgb 5.0  Date & Time Notied:  10/14/19  Provider Notified: Forrest Moron NP  Orders Received/Actions taken: transfusion ordered

## 2019-10-14 NOTE — Social Work (Signed)
Per uploaded paperwork (you can locate this under media tab) pt legal guardian is sister Ivan [name], [phone].  All decisions/medical consents need to be passed through her.   This was communicated to bedside RN and MD. Also placed on care team/md sticky notes.  Westley Hummer, MSW, [place] [employer]

## 2019-10-14 NOTE — Consult Note (Signed)
[facility]  INPATIENT CONSULTATION  Reason for Consultation: AKI Requesting Provider: Dr. Earnest Conroy  HPI: Ivan [name] is an 59 y.o. male is a 59yo man with DM, HTN, Schizophrenia, BPH, cirrhosis and CKD 4 who is seen in consultation for evaluation and management of AKI on CKD.    Pt was admitted yesterday from oncology clinic for hypotension 70/40s.  He presented there for evaluation of liver lesions concerning for malignancy.  He presented outpt on background of 60lb wt loss over 1 year, jaundice and icterus.  RUQ US showed multiple echoic lesions on liver.  He's had poor po intake lately, weakness, inability to stand, back pain, some emesis and diarrhea.   He has a h/o CKD most recently CKD 4 with baseline in mid 3s.  Per notes he's been seen by [facility] in the past but recently declined f/u stating his PCP was following his CKD.  On presentation yesterday his labs showed BUN 128, Cr 6.94.  He was hydrated with I/Os of ~2.5L / 0.2L (all UOP) since admission.  Labs this afternoon showed worsening renal function with BUN 158, Cr 7.52.  He was anemic to Hb 5 on admission and rec'd 2u pRBC with f/u Hb 6.9.  ALP elevated 901 AST 227, ALT 101, Albumin 1.6, T bili 8.2.  Ca 19-9 and AFP are pending.  Renal US showed R 12.3, L 13.3 cm, echogenic.  > 10 cysts in each kidney, no hydronephrosis.  MRCP pending.  CT lumbar spine shows DJD, echogenic lesions in kidneys (c/w cysts on [place]), 1.9cm L adrenal nodule poss metastatic.   GI consult is pending this evening.  Pt is sleepy but arousable.  Denies current complaints.  Says food tastes 'so so', endorses dry mouth but not thirst.  No dyspnea or orthopnea.  His HCPOA, sister, is at bedside tonight.  She is aware of the situation and would like information from all consultants prior to making therapeutic decisions.  She says at baseline her brother lives independently and typically drives but recently lost license she thinks through a paperwork  issue.    PMH: Past Medical History:  Diagnosis Date  . Bipolar 1 disorder ([place])   . Diabetes mellitus   . Edema of both legs 04/07/2013  . Gout 09/15/2019  . Hypertension   . Hypokalemia 04/02/2013  . Murmur    Echo done 2018  . Paranoid schizophrenia ([place])   . Schizoaffective disorder ([facility])    PSH: Past Surgical History:  Procedure Laterality Date  . BREAST SURGERY  1970   breast reduction  . COLONOSCOPY  2017   WNL  . EYE SURGERY Right    prosthesis  . FRACTURE SURGERY     ribs and arm  . TRANSTHORACIC ECHOCARDIOGRAM  04/04/2017   EF 48-18%; grade I diastolic dysfunction    Past Medical History:  Diagnosis Date  . Bipolar 1 disorder ([place])   . Diabetes mellitus   . Edema of both legs 04/07/2013  . Gout 09/15/2019  . Hypertension   . Hypokalemia 04/02/2013  . Murmur    Echo done 2018  . Paranoid schizophrenia ([place])   . Schizoaffective disorder ([place])     Medications:  I have reviewed the patient's current medications.  Medications Prior to Admission  Medication Sig Dispense Refill  . allopurinol (ZYLOPRIM) 100 MG tablet TAKE (1) TABLET BY MOUTH ONCE DAILY. (Patient taking differently: Take 100 mg by mouth daily. ) 90 tablet 2  . amLODipine (NORVASC) 10 MG tablet Take  1 tablet by mouth daily.    Marland Kitchen aspirin EC 81 MG EC tablet Take 1 tablet (81 mg total) by mouth daily. 30 tablet 0  . atorvastatin (LIPITOR) 20 MG tablet Take 1 tablet by mouth daily.    . benztropine (COGENTIN) 0.5 MG tablet Take 1 tablet (0.5 mg total) by mouth daily. For durg induced extrapyramidal reaction (Patient taking differently: Take 0.5 mg by mouth 2 (two) times daily. For durg induced extrapyramidal reaction) 30 tablet 0  . cloNIDine (CATAPRES) 0.1 MG tablet Take 1 tablet (0.1 mg total) by mouth 2 (two) times daily. For high blood pressure 60 tablet 0  . cyclobenzaprine (FLEXERIL) 5 MG tablet Take 1 tablet (5 mg total) by mouth 3 (three) times daily as needed for muscle spasms. 30 tablet 0  .  ferrous sulfate 325 (65 FE) MG tablet Take 1 tablet (325 mg total) by mouth 2 (two) times daily. (Patient taking differently: Take 325 mg by mouth 2 (two) times daily with a meal. ) 60 tablet 2  . furosemide (LASIX) 40 MG tablet Take 1 tablet (40 mg total) by mouth 2 (two) times daily. (Patient taking differently: Take 40 mg by mouth daily. ) 60 tablet 0  . haloperidol decanoate (HALDOL DECANOATE) 100 MG/ML injection Inject 1 mL into the muscle every 30 (thirty) days.    Marland Kitchen labetalol (NORMODYNE) 200 MG tablet Take 1 tablet by mouth 2 (two) times daily.    Marland Kitchen losartan (COZAAR) 25 MG tablet Take 1 tablet by mouth daily.    . magnesium oxide (MAG-OX) 400 MG tablet Take 1 tablet by mouth 2 (two) times daily.    . OXcarbazepine (TRILEPTAL) 150 MG tablet Take 1 tablet (150 mg total) by mouth 2 (two) times daily. For Mood stabilization 60 tablet 0  . potassium chloride SA (KLOR-CON) 20 MEQ tablet Take 1 tablet (20 mEq total) by mouth daily. 30 tablet 2  . TRADJENTA 5 MG TABS tablet TAKE 1 TABLET BY MOUTH ONCE DAILY FOR DIABETES. (Patient taking differently: Take 5 mg by mouth daily. ) 90 tablet 1  . traZODone (DESYREL) 150 MG tablet Take 150 mg by mouth at bedtime.      ALLERGIES:   Allergies  Allergen Reactions  . Bee Venom Swelling    FAM HX: Family History  Problem Relation Age of Onset  . Diabetes Mother   . Heart disease Mother   . Kidney disease Mother   . Heart disease Father   . Lung cancer Father   . Diabetes Son   . Diabetes Maternal Grandmother   . Heart disease Paternal Grandmother   . Heart disease Paternal Grandfather   . Breast cancer Paternal Aunt   . Prostate cancer Paternal Uncle   . Depression Neg Hx   . Colon cancer Neg Hx   . Liver disease Neg Hx     Social History:   reports that he has been smoking cigarettes and cigars. He has a 19.50 pack-year smoking history. His smokeless tobacco use includes chew. He reports current alcohol use of about 2.0 standard drinks of  alcohol per week. He reports that he does not use drugs.  ROS: 12 system ROS per HPI above  Blood pressure (!) 108/54, pulse 90, temperature 98 F (36.7 C), temperature source Oral, resp. rate 16, height 6\' 3"  (1.905 m), weight 111.6 kg, SpO2 98 %. PHYSICAL EXAM: Gen: chronically ill but nontoxic appearing lying on side in no distress  Eyes:  icteric ENT: MM tacky, poor  dentition Neck: supple CV:  RRR, no rub Abd:  Soft, obese, nontender, +BS Lungs: clear to bases, normal WOB, normal sat on RA GU: condom cath with scant amber urine in bag Extr: no edema Neuro: oriented to person, Ivan [name] and 2021.  Sleepy but arouses to voice, cannot cooperate for asterixis exam but no clonus Skin: jaundeiced, chronic scaling hyperpigmented skin of LE   Results for orders placed or performed during the hospital encounter of 10/13/19 (from the past 48 hour(s))  Comprehensive metabolic panel     Status: Abnormal   Collection Time: 10/13/19  3:43 PM  Result Value Ref Range   Sodium 133 (L) 135 - 145 mmol/L   Potassium 4.5 3.5 - 5.1 mmol/L   Chloride 97 (L) 98 - 111 mmol/L   CO2 18 (L) 22 - 32 mmol/L   Glucose, Bld 148 (H) 70 - 99 mg/dL    Comment: Glucose reference range applies only to samples taken after fasting for at least 8 hours.   BUN 128 (H) 6 - 20 mg/dL    Comment: RESULTS CONFIRMED BY MANUAL DILUTION   Creatinine, Ser 6.94 (H) 0.61 - 1.24 mg/dL   Calcium 9.4 8.9 - 10.3 mg/dL   Total Protein 7.8 6.5 - 8.1 g/dL   Albumin 2.2 (L) 3.5 - 5.0 g/dL   AST 265 (H) 15 - 41 U/L   ALT 123 (H) 0 - 44 U/L   Alkaline Phosphatase 1,068 (H) 38 - 126 U/L   Total Bilirubin 8.2 (H) 0.3 - 1.2 mg/dL   GFR calc non Af Amer 8 (L) >60 mL/min   GFR calc Af Amer 9 (L) >60 mL/min   Anion gap 18 (H) 5 - 15    Comment: Performed at [facility], [address], [place], [place] [address]  Lipase, blood     Status: Abnormal   Collection Time: 10/13/19  3:43 PM  Result Value Ref Range   Lipase 91 (H) 11 - 51  U/L    Comment: Performed at [facility], [address], [place], [place] [address]  Troponin I (High Sensitivity)     Status: Abnormal   Collection Time: 10/13/19  3:43 PM  Result Value Ref Range   Troponin I (High Sensitivity) 90 (H) <18 ng/L    Comment: (NOTE) Elevated high sensitivity troponin I (hsTnI) values and significant  changes across serial measurements may suggest ACS but many other  chronic and acute conditions are known to elevate hsTnI results.  Refer to the Links section for chest pain algorithms and additional  guidance. Performed at [facility], [address], [place], [place] [address]   Ammonia     Status: Abnormal   Collection Time: 10/13/19  3:43 PM  Result Value Ref Range   Ammonia 36 (H) 9 - 35 umol/L    Comment: Performed at [facility] [place], [address] [place], [place] [address]  CBG monitoring, ED     Status: Abnormal   Collection Time: 10/13/19  4:04 PM  Result Value Ref Range   Glucose-Capillary 138 (H) 70 - 99 mg/dL    Comment: Glucose reference range applies only to samples taken after fasting for at least 8 hours.  Troponin I (High Sensitivity)     Status: Abnormal   Collection Time: 10/13/19  5:05 PM  Result Value Ref Range   Troponin I (High Sensitivity) 102 (HH) <18 ng/L    Comment: CRITICAL RESULT CALLED TO, READ BACK BY AND VERIFIED WITH: WESTON,L AT 1818 ON  2.23.21 BY ISLEY,B (NOTE) Elevated high sensitivity troponin I (hsTnI) values and significant  changes across serial measurements may suggest ACS but many other  chronic and acute conditions are known to elevate hsTnI results.  Refer to the Links section for chest pain algorithms and additional  guidance. Performed at [facility], [address] [place], [place] [address]   SARS CORONAVIRUS 2 (TAT 6-24 HRS) Nasopharyngeal Nasopharyngeal Swab     Status: None   Collection Time: 10/13/19  5:41 PM   Specimen: Nasopharyngeal Swab  Result Value Ref Range   SARS Coronavirus 2 NEGATIVE  NEGATIVE    Comment: (NOTE) SARS-CoV-2 target nucleic acids are NOT DETECTED. The SARS-CoV-2 RNA is generally detectable in upper and lower respiratory specimens during the acute phase of infection. Negative results do not preclude SARS-CoV-2 infection, do not rule out co-infections with other pathogens, and should not be used as the sole basis for treatment or other patient management decisions. Negative results must be combined with clinical observations, patient history, and epidemiological information. The expected result is Negative. Fact Sheet for Patients: SugarRoll.be Fact Sheet for Healthcare Providers: https://www.woods-mathews.com/ This test is not yet approved or cleared by the [place] FDA and  has been authorized for detection and/or diagnosis of SARS-CoV-2 by FDA under an Emergency Use Authorization (EUA). This EUA will remain  in effect (meaning this test can be used) for the duration of the COVID-19 declaration under Section 56 4(b)(1) of the Act, 21 U.S.C. section 360bbb-3(b)(1), unless the authorization is terminated or revoked sooner. Performed at [facility], [place] [address] [place], [place] [address]   Glucose, capillary     Status: Abnormal   Collection Time: 10/13/19  9:37 PM  Result Value Ref Range   Glucose-Capillary 151 (H) 70 - 99 mg/dL    Comment: Glucose reference range applies only to samples taken after fasting for at least 8 hours.  HIV Antibody (routine testing w rflx)     Status: None   Collection Time: 10/13/19  9:50 PM  Result Value Ref Range   HIV Screen 4th Generation wRfx NON REACTIVE NON REACTIVE    Comment: Performed at [facility], [address], [place], [place] [address]  Hemoglobin A1c     Status: Abnormal   Collection Time: 10/13/19  9:50 PM  Result Value Ref Range   Hgb A1c MFr Bld 5.8 (H) 4.8 - 5.6 %    Comment: (NOTE) Pre diabetes:          5.7%-6.4% Diabetes:               >6.4% Glycemic control for   <7.0% adults with diabetes    Mean Plasma Glucose 119.76 mg/dL    Comment: Performed at [place] [address], [place], [place] [address]  Protime-INR     Status: Abnormal   Collection Time: 10/13/19  9:50 PM  Result Value Ref Range   Prothrombin Time 28.3 (H) 11.4 - 15.2 seconds   INR 2.7 (H) 0.8 - 1.2    Comment: (NOTE) INR goal varies based on device and disease states. Performed at [facility], [place] [address], [place], [place] [address]   Magnesium     Status: Abnormal   Collection Time: 10/13/19  9:50 PM  Result Value Ref Range   Magnesium 3.0 (H) 1.7 - 2.4 mg/dL    Comment: Performed at [facility] [address], [place], [place] [address]  Glucose, capillary  Status: Abnormal   Collection Time: 10/13/19 11:59 PM  Result Value Ref Range   Glucose-Capillary 141 (H) 70 - 99 mg/dL    Comment: Glucose reference range applies only to samples taken after fasting for at least 8 hours.  CBC     Status: Abnormal   Collection Time: 10/14/19  1:45 AM  Result Value Ref Range   WBC 14.5 (H) 4.0 - 10.5 K/uL   RBC 1.82 (L) 4.22 - 5.81 MIL/uL   Hemoglobin 5.0 (LL) 13.0 - 17.0 g/dL    Comment: REPEATED TO VERIFY THIS CRITICAL RESULT HAS VERIFIED AND BEEN CALLED TO RN NANCY IRISH BY MESSAN HOUEGNIFIO ON 02 24 2021 AT 0248, AND HAS BEEN READ BACK.     HCT 16.2 (L) 39.0 - 52.0 %   MCV 89.0 80.0 - 100.0 fL   MCH 27.5 26.0 - 34.0 pg   MCHC 30.9 30.0 - 36.0 g/dL   RDW 22.1 (H) 11.5 - 15.5 %   Platelets 383 150 - 400 K/uL   nRBC 0.5 (H) 0.0 - 0.2 %    Comment: Performed at [facility] [address] [place], [place] [address]  Prepare RBC     Status: None   Collection Time: 10/14/19  3:40 AM  Result Value Ref Range   Order Confirmation      ORDER PROCESSED BY BLOOD BANK Performed at [facility], [place] [address] [place], [place] [address]   Type and screen [place]     Status: None  (Preliminary result)   Collection Time: 10/14/19  3:40 AM  Result Value Ref Range   ABO/RH(D) O POS    Antibody Screen NEG    Sample Expiration 10/17/2019,2359    Unit Number [id number]    Blood Component Type RED CELLS,LR    Unit division 00    Status of Unit ISSUED    Transfusion Status OK TO TRANSFUSE    Crossmatch Result Compatible    Unit Number [id number]    Blood Component Type RED CELLS,LR    Unit division 00    Status of Unit ISSUED    Transfusion Status OK TO TRANSFUSE    Crossmatch Result      Compatible Performed at [facility], [place] [address] [place], [place] [address]   ABO/Rh     Status: None   Collection Time: 10/14/19  3:40 AM  Result Value Ref Range   ABO/RH(D)      O POS Performed at [facility] [address] [place], [place] [address]   MRSA PCR Screening     Status: Abnormal   Collection Time: 10/14/19  3:43 AM   Specimen: Nasopharyngeal  Result Value Ref Range   MRSA by PCR POSITIVE (A) NEGATIVE    Comment:        The GeneXpert MRSA Assay (FDA approved for NASAL specimens only), is one component of a comprehensive MRSA colonization surveillance program. It is not intended to diagnose MRSA infection nor to guide or monitor treatment for MRSA infections. RESULT CALLED TO, READ BACK BY AND VERIFIED WITH: CELSO RN 10/14/19 0513 JDW Performed at [facility] [address], [place], [place] [address]   Urinalysis, Routine w reflex microscopic     Status: Abnormal   Collection Time: 10/14/19  3:57 AM  Result Value Ref Range   Color, Urine AMBER (A) YELLOW    Comment: BIOCHEMICALS MAY BE AFFECTED BY COLOR   APPearance  HAZY (A) CLEAR   Specific Gravity, Urine 1.017 1.005 - 1.030   pH 5.0 5.0 - 8.0   Glucose, UA NEGATIVE NEGATIVE mg/dL   Hgb urine dipstick MODERATE (A) NEGATIVE   Bilirubin Urine MODERATE (A) NEGATIVE   Ketones, ur NEGATIVE NEGATIVE mg/dL   Protein, ur 100 (A) NEGATIVE mg/dL   Nitrite NEGATIVE  NEGATIVE   Leukocytes,Ua NEGATIVE NEGATIVE   RBC / HPF 0-5 0 - 5 RBC/hpf   WBC, UA 11-20 0 - 5 WBC/hpf   Bacteria, UA FEW (A) NONE SEEN   Squamous Epithelial / LPF 0-5 0 - 5   Mucus PRESENT    Amorphous Crystal PRESENT     Comment: Performed at [facility], [place] [address], [place], [place] [address]  Glucose, capillary     Status: Abnormal   Collection Time: 10/14/19  6:54 AM  Result Value Ref Range   Glucose-Capillary 130 (H) 70 - 99 mg/dL    Comment: Glucose reference range applies only to samples taken after fasting for at least 8 hours.  Glucose, capillary     Status: Abnormal   Collection Time: 10/14/19  7:45 AM  Result Value Ref Range   Glucose-Capillary 131 (H) 70 - 99 mg/dL    Comment: Glucose reference range applies only to samples taken after fasting for at least 8 hours.  Glucose, capillary     Status: Abnormal   Collection Time: 10/14/19 12:02 PM  Result Value Ref Range   Glucose-Capillary 125 (H) 70 - 99 mg/dL    Comment: Glucose reference range applies only to samples taken after fasting for at least 8 hours.  CBC     Status: Abnormal   Collection Time: 10/14/19  2:32 PM  Result Value Ref Range   WBC 16.4 (H) 4.0 - 10.5 K/uL   RBC 2.43 (L) 4.22 - 5.81 MIL/uL   Hemoglobin 6.9 (LL) 13.0 - 17.0 g/dL    Comment: REPEATED TO VERIFY POST TRANSFUSION SPECIMEN CRITICAL VALUE NOTED.  VALUE IS CONSISTENT WITH PREVIOUSLY REPORTED AND CALLED VALUE.    HCT 21.6 (L) 39.0 - 52.0 %   MCV 88.9 80.0 - 100.0 fL   MCH 28.4 26.0 - 34.0 pg   MCHC 31.9 30.0 - 36.0 g/dL   RDW 17.9 (H) 11.5 - 15.5 %   Platelets 370 150 - 400 K/uL   nRBC 1.0 (H) 0.0 - 0.2 %    Comment: Performed at [facility] [address], [name], [place] [address]  Comprehensive metabolic panel     Status: Abnormal   Collection Time: 10/14/19  2:32 PM  Result Value Ref Range   Sodium 137 135 - 145 mmol/L   Potassium 4.6 3.5 - 5.1 mmol/L   Chloride 101 98 - 111 mmol/L   CO2 19 (L) 22 - 32 mmol/L    Glucose, Bld 131 (H) 70 - 99 mg/dL    Comment: Glucose reference range applies only to samples taken after fasting for at least 8 hours.   BUN 158 (H) 6 - 20 mg/dL   Creatinine, Ser 7.52 (H) 0.61 - 1.24 mg/dL   Calcium 9.0 8.9 - 10.3 mg/dL   Total Protein 6.8 6.5 - 8.1 g/dL   Albumin 1.6 (L) 3.5 - 5.0 g/dL   AST 227 (H) 15 - 41 U/L   ALT 101 (H) 0 - 44 U/L   Alkaline Phosphatase 901 (H) 38 - 126 U/L   Total Bilirubin 6.9 (H) 0.3 - 1.2 mg/dL  GFR calc non Af Amer 7 (L) >60 mL/min   GFR calc Af Amer 8 (L) >60 mL/min   Anion gap 17 (H) 5 - 15    Comment: Performed at [facility] [address] [place], [place] [address]  Iron and TIBC     Status: Abnormal   Collection Time: 10/14/19  2:32 PM  Result Value Ref Range   Iron 49 45 - 182 ug/dL   TIBC 210 (L) 250 - 450 ug/dL   Saturation Ratios 23 17.9 - 39.5 %   UIBC 161 ug/dL    Comment: Performed at [facility], [place] [address], [place], [place] [address]  Ferritin     Status: Abnormal   Collection Time: 10/14/19  2:32 PM  Result Value Ref Range   Ferritin 4,420 (H) 24 - 336 ng/mL    Comment: Performed at [facility], [place] [address] [place], [place] [address]  Cortisol     Status: None   Collection Time: 10/14/19  2:32 PM  Result Value Ref Range   Cortisol, Plasma 22.6 ug/dL    Comment: (NOTE) AM    6.7 - 22.6 ug/dL PM   <10.0       ug/dL Performed at [facility] [address] [place], [place] [address]   Glucose, capillary     Status: Abnormal   Collection Time: 10/14/19  4:47 PM  Result Value Ref Range   Glucose-Capillary 124 (H) 70 - 99 mg/dL    Comment: Glucose reference range applies only to samples taken after fasting for at least 8 hours.    CT LUMBAR SPINE WO CONTRAST  Result Date: 10/14/2019 CLINICAL DATA:  Low back pain for greater than 6 weeks with new liver lesions, concern for malignancy EXAM: CT LUMBAR SPINE WITHOUT CONTRAST TECHNIQUE: Multidetector CT imaging of the lumbar  spine was performed without intravenous contrast administration. Multiplanar CT image reconstructions were also generated. COMPARISON:  Lumbar radiograph 11/25/2014 FINDINGS: Segmentation: 5 lumbar type vertebral bodies are present and appear normally formed. Alignment: Preservation of the normal lumbar lordosis. No spondylolysis or spondylolisthesis. Vertebrae: No acute fracture vertebral body height loss is seen. Few Schmorl's nodes are noted at the endplates about the M4-6 level and at the inferior L4 endplate as well. No worrisome lytic or sclerotic lesions are present. Mild sclerotic changes at the superior endplate S1 are on a likely degenerative basis. Included portions of the sacrum reveal fusion across the SI joints. Paraspinal and other soft tissues: No paraspinal fluid, hemorrhage or soft tissue swelling is seen. Evaluation of the included portions of the abdomen reveal numerous hyperattenuating structures in both kidneys largest in the right measuring up to 2.6 cm and up to 70 Hounsfield units in attenuation. Larger fluid attenuation cyst is present in the posterior left kidney measuring up to 3.6 cm in size. There is atherosclerotic calcification of the aorta and iliac arteries as included. Scattered colonic diverticula without evidence of acute diverticulitis. Heterogeneous nodule present in the left adrenal gland measuring up to 1.9 cm in size (4/33). Disc levels: Level by level evaluation of the lumbar spine below: T12-L1: Mild disc height loss. No significant posterior disc abnormality. Minimal facet degenerative change. No significant spinal canal or foraminal stenosis. L1-L2: Mild disc height loss with mild global disc bulge. Moderate facet degenerative changes. No significant spinal canal or foraminal stenosis. L2-L3: Mild disc height loss with mild global disc bulge. Moderate facet degenerative changes. No significant spinal canal  or foraminal stenosis. L3-L4: Mild disc height loss and moderate  global disc bulge and moderate facet degenerative changes with facet hypertrophy. Disc bulging results in narrowing of the subarticular recesses and contact of the traversing L4 nerve roots. Mild canal stenosis and mild bilateral foraminal narrowing. L4-L5: Mild disc height loss and moderate global disc bulge and moderate facet degenerative changes with facet hypertrophy. Disc bulging results in narrowing of the subarticular recesses and contact of the traversing L4 nerve roots. Mild canal stenosis with mild bilateral foraminal narrowing. L5-S1: Moderate disc height loss with global disc bulge. Calcified right central disc protrusion contacting the traversing S1 nerve root. Mild canal stenosis. Moderate left and mild right foraminal narrowing. IMPRESSION: 1. No acute fracture or vertebral body height loss of the lumbar spine. No suspicious osseous lesions. 2. Multilevel degenerative disc and facet degenerative changes throughout the lumbar spine as detailed level by level above. Most pronounced L3-S1. 3. Multiple hyperattenuating structures in both kidneys which may represent hyperdense cysts or solid masses. Consider further evaluation with nonemergent renal ultrasound. 4. Heterogeneous left adrenal nodule measuring up to 1.9 cm in size. Worrisome for potential metastatic disease in the setting of known malignancy. 5. Colonic diverticulosis without evidence of acute diverticulitis. 6. Aortic Atherosclerosis (ICD10-I70.0). Electronically Signed   By: Lovena Le M.D.   On: 10/14/2019 00:05   US RENAL  Result Date: 10/14/2019 CLINICAL DATA:  Renal mass on CT EXAM: RENAL / URINARY TRACT ULTRASOUND COMPLETE COMPARISON:  CT 10/13/2019, ultrasound 09/22/2019 FINDINGS: Right Kidney: Renal measurements: 12.3 x 6 x 5.7 cm = volume: 222.3 mL. Echogenic cortex. No hydronephrosis. Numerous, greater than 10 cysts in the right kidney. The largest are measured. Midpole cyst measures 4 x 3.9 x 3.7 cm. Upper pole cyst measures  3.3 x 3.7 x 3.2 cm. Left Kidney: Renal measurements: 13.3 x 5.8 x 6.5 cm = volume: 259.7 mL. Echogenic cortex. No hydronephrosis. Multiple cysts, greater than 10. The largest were measured. Midpole cyst measures 3.2 x 2.9 x 3 cm. Upper pole cyst measures 5.6 x 5.8 x 6.3 cm. Bladder: Appears normal for degree of bladder distention. Other: Multiple hypoechoic solid masses in the liver. IMPRESSION: 1. Echogenic kidneys bilaterally consistent with medical renal disease. No hydronephrosis. 2. Multiple cysts within the bilateral kidneys, greater than 10 within each kidney. No solid masses by sonography. 3. Multiple hypoechoic solid masses in the liver concerning for metastatic disease. Electronically Signed   By: Donavan Foil M.D.   On: 10/14/2019 17:19    Assessment/Plan  **hepatitis, hyperbilirubinemia, liver lesions:  Concern for metastatic malignancy, w/u underway; per primary and GI. MRCP pending.   She requested an update from GI at time of consultation.  ** severe oliguric AKI on CKD 4:  Presumably due to ATN in setting of hypotension on advanced CKD already. Has not improved with IV hydration and improvement in BP.  UA noted - mod blood on dip, not much on microscopy, mild proteinuria c/w prior UAs.  Check FeNa.  Appears slightly hypovolemic still and I think the MIVF rate is appropriate overnight.  He has no emergency need for dialysis at this time but certainly has uremic symptoms.  Would like to await MRCP and GI evaluation.  He appears quite frail and if has metastatic malignancy that isn't treatable would not be a dialysis candidate.  I discussed this with his sister.  She wishes for all of the information to make informed decisions.    **Hypotension: resolved, cortisol normal.  Suspect hypovolemia.  Judicious fluids with oliguric AKI  **Metabolic acidosis: secondary to AKI on CKD. On bicarb gtt 75/hr.  **Anemia:  S/p 2u pRBC, Hb 6.9, per primary.  Hemoccult pending.  **renal cystic disease:   ? Etiology, not pertinent to acute discussion.  Does have FH of ESRD but sister doesn't think ADPKD.   Will follow.   Justin Mend 10/14/2019, 6:21 PM

## 2019-10-14 NOTE — Consult Note (Signed)
UNASSIGNED PATIENT Reason for Consult: Multiple liver lesions. Referring Physician: TRH.  Ivan [name] is an 59 y.o. male.  HPI: Ivan [name] is a 59 year old black male with multiple medical problems listed below who was admitted to hospital with hypotension and abnormal LFTs noted to have cirrhotic liver with intrahepatic biliary ductal dilatation on ultrasound done on 09/22/2019.  Patient was found to be with weekly in the target when he was at the oncology office and give a history of a 30 to 60 pound weight loss within the last year prompting them to send him to the emergency room for admission and further evaluation.  Patient sister noted him being jaundiced for the last couple of weeks and has not been eating properly.  Patient is unable to give much history.  Most of the history been procured from the patient's chart.  Past Medical History:  Diagnosis Date  . Bipolar 1 disorder ([place])   . Diabetes mellitus   . Edema of both legs 04/07/2013  . Gout 09/15/2019  . Hypertension   . Hypokalemia 04/02/2013  . Murmur    Echo done 2018  . Paranoid schizophrenia ([place])   . Schizoaffective disorder [facility])    Past Surgical History:  Procedure Laterality Date  . BREAST SURGERY  1970   breast reduction  . COLONOSCOPY  2017   WNL  . EYE SURGERY Right    prosthesis  . FRACTURE SURGERY     ribs and arm  . TRANSTHORACIC ECHOCARDIOGRAM  04/04/2017   EF 38-75%; grade I diastolic dysfunction   Family History  Problem Relation Age of Onset  . Diabetes Mother   . Heart disease Mother   . Kidney disease Mother   . Heart disease Father   . Lung cancer Father   . Diabetes Son   . Diabetes Maternal Grandmother   . Heart disease Paternal Grandmother   . Heart disease Paternal Grandfather   . Breast cancer Paternal Aunt   . Prostate cancer Paternal Uncle   . Depression Neg Hx   . Colon cancer Neg Hx   . Liver disease Neg Hx    Social History:  reports that he has been smoking cigarettes  and cigars. He has a 19.50 pack-year smoking history. His smokeless tobacco use includes chew. He reports current alcohol use of about 2.0 standard drinks of alcohol per week. He reports that he does not use drugs.  Allergies:  Allergies  Allergen Reactions  . Bee Venom Swelling   Medications: I have reviewed the patient's current medications.  Results for orders placed or performed during the hospital encounter of 10/13/19 (from the past 48 hour(s))  Comprehensive metabolic panel     Status: Abnormal   Collection Time: 10/13/19  3:43 PM  Result Value Ref Range   Sodium 133 (L) 135 - 145 mmol/L   Potassium 4.5 3.5 - 5.1 mmol/L   Chloride 97 (L) 98 - 111 mmol/L   CO2 18 (L) 22 - 32 mmol/L   Glucose, Bld 148 (H) 70 - 99 mg/dL    Comment: Glucose reference range applies only to samples taken after fasting for at least 8 hours.   BUN 128 (H) 6 - 20 mg/dL    Comment: RESULTS CONFIRMED BY MANUAL DILUTION   Creatinine, Ser 6.94 (H) 0.61 - 1.24 mg/dL   Calcium 9.4 8.9 - 10.3 mg/dL   Total Protein 7.8 6.5 - 8.1 g/dL   Albumin 2.2 (L) 3.5 - 5.0 g/dL   AST 265 (  H) 15 - 41 U/L   ALT 123 (H) 0 - 44 U/L   Alkaline Phosphatase 1,068 (H) 38 - 126 U/L   Total Bilirubin 8.2 (H) 0.3 - 1.2 mg/dL   GFR calc non Af Amer 8 (L) >60 mL/min   GFR calc Af Amer 9 (L) >60 mL/min   Anion gap 18 (H) 5 - 15    Comment: Performed at [facility], [address] [place], [place] [address]  Lipase, blood     Status: Abnormal   Collection Time: 10/13/19  3:43 PM  Result Value Ref Range   Lipase 91 (H) 11 - 51 U/L    Comment: Performed at [facility], [address] [place], [place] [address]  Troponin I (High Sensitivity)     Status: Abnormal   Collection Time: 10/13/19  3:43 PM  Result Value Ref Range   Troponin I (High Sensitivity) 90 (H) <18 ng/L    Comment: (NOTE) Elevated high sensitivity troponin I (hsTnI) values and significant  changes across serial measurements may suggest ACS but many other   chronic and acute conditions are known to elevate hsTnI results.  Refer to the Links section for chest pain algorithms and additional  guidance. Performed at [facility], [address] [place], [place] [address]   Ammonia     Status: Abnormal   Collection Time: 10/13/19  3:43 PM  Result Value Ref Range   Ammonia 36 (H) 9 - 35 umol/L    Comment: Performed at [facility], [address], [place], [place] [address]  CBG monitoring, ED     Status: Abnormal   Collection Time: 10/13/19  4:04 PM  Result Value Ref Range   Glucose-Capillary 138 (H) 70 - 99 mg/dL    Comment: Glucose reference range applies only to samples taken after fasting for at least 8 hours.  Troponin I (High Sensitivity)     Status: Abnormal   Collection Time: 10/13/19  5:05 PM  Result Value Ref Range   Troponin I (High Sensitivity) 102 (HH) <18 ng/L    Comment: CRITICAL RESULT CALLED TO, READ BACK BY AND VERIFIED WITH: WESTON,L AT 1818 ON 2.23.21 BY ISLEY,B (NOTE) Elevated high sensitivity troponin I (hsTnI) values and significant  changes across serial measurements may suggest ACS but many other  chronic and acute conditions are known to elevate hsTnI results.  Refer to the Links section for chest pain algorithms and additional  guidance. Performed at [facility], [address], [place], [place] [address]   SARS CORONAVIRUS 2 (TAT 6-24 HRS) Nasopharyngeal Nasopharyngeal Swab     Status: None   Collection Time: 10/13/19  5:41 PM   Specimen: Nasopharyngeal Swab  Result Value Ref Range   SARS Coronavirus 2 NEGATIVE NEGATIVE    Comment: (NOTE) SARS-CoV-2 target nucleic acids are NOT DETECTED. The SARS-CoV-2 RNA is generally detectable in upper and lower respiratory specimens during the acute phase of infection. Negative results do not preclude SARS-CoV-2 infection, do not rule out co-infections with other pathogens, and should not be used as the sole basis for treatment or other patient management  decisions. Negative results must be combined with clinical observations, patient history, and epidemiological information. The expected result is Negative. Fact Sheet for Patients: SugarRoll.be Fact Sheet for Healthcare Providers: https://www.woods-mathews.com/ This test is not yet approved or cleared by the [place] FDA and  has been authorized for detection and/or diagnosis of SARS-CoV-2 by FDA under an Emergency Use Authorization (EUA). This EUA will remain  in effect (meaning this test can be used) for the duration of the COVID-19 declaration under Section 56 4(b)(1) of the Act, 21 U.S.C. section 360bbb-3(b)(1), unless the authorization is terminated or revoked sooner. Performed at [facility], [place] [address], [place], [place] [address]   Glucose, capillary     Status: Abnormal   Collection Time: 10/13/19  9:37 PM  Result Value Ref Range   Glucose-Capillary 151 (H) 70 - 99 mg/dL    Comment: Glucose reference range applies only to samples taken after fasting for at least 8 hours.  HIV Antibody (routine testing w rflx)     Status: None   Collection Time: 10/13/19  9:50 PM  Result Value Ref Range   HIV Screen 4th Generation wRfx NON REACTIVE NON REACTIVE    Comment: Performed at [facility], [address] [place] [address], [place], [place] [address]  Hemoglobin A1c     Status: Abnormal   Collection Time: 10/13/19  9:50 PM  Result Value Ref Range   Hgb A1c MFr Bld 5.8 (H) 4.8 - 5.6 %    Comment: (NOTE) Pre diabetes:          5.7%-6.4% Diabetes:              >6.4% Glycemic control for   <7.0% adults with diabetes    Mean Plasma Glucose 119.76 mg/dL    Comment: Performed at [facility] [address] [place], [place] [address]  Protime-INR     Status: Abnormal   Collection Time: 10/13/19  9:50 PM  Result Value Ref Range   Prothrombin Time 28.3 (H) 11.4 - 15.2 seconds   INR 2.7 (H) 0.8 - 1.2    Comment: (NOTE) INR goal  varies based on device and disease states. Performed at [facility], [place] [address], [place], [place] [address]   Magnesium     Status: Abnormal   Collection Time: 10/13/19  9:50 PM  Result Value Ref Range   Magnesium 3.0 (H) 1.7 - 2.4 mg/dL    Comment: Performed at [facility] [address], [place], [place] [address]  Glucose, capillary     Status: Abnormal   Collection Time: 10/13/19 11:59 PM  Result Value Ref Range   Glucose-Capillary 141 (H) 70 - 99 mg/dL    Comment: Glucose reference range applies only to samples taken after fasting for at least 8 hours.  CBC     Status: Abnormal   Collection Time: 10/14/19  1:45 AM  Result Value Ref Range   WBC 14.5 (H) 4.0 - 10.5 K/uL   RBC 1.82 (L) 4.22 - 5.81 MIL/uL   Hemoglobin 5.0 (LL) 13.0 - 17.0 g/dL    Comment: REPEATED TO VERIFY THIS CRITICAL RESULT HAS VERIFIED AND BEEN CALLED TO RN NANCY IRISH BY MESSAN HOUEGNIFIO ON 02 24 2021 AT 0248, AND HAS BEEN READ BACK.     HCT 16.2 (L) 39.0 - 52.0 %   MCV 89.0 80.0 - 100.0 fL   MCH 27.5 26.0 - 34.0 pg   MCHC 30.9 30.0 - 36.0 g/dL   RDW 22.1 (H) 11.5 - 15.5 %   Platelets 383 150 - 400 K/uL   nRBC 0.5 (H) 0.0 - 0.2 %    Comment: Performed at [facility] [address] [place], [place] [address]  Prepare RBC     Status: None   Collection Time: 10/14/19  3:40 AM  Result Value Ref Range   Order  Confirmation      ORDER PROCESSED BY BLOOD BANK Performed at [facility], [place] [address] [place], [place] [address]   Type and screen [place]     Status: None (Preliminary result)   Collection Time: 10/14/19  3:40 AM  Result Value Ref Range   ABO/RH(D) O POS    Antibody Screen NEG    Sample Expiration 10/17/2019,2359    Unit Number [id number]    Blood Component Type RED CELLS,LR    Unit division 00    Status of Unit ISSUED    Transfusion Status OK TO TRANSFUSE    Crossmatch Result Compatible    Unit Number [id number]    Blood  Component Type RED CELLS,LR    Unit division 00    Status of Unit ISSUED    Transfusion Status OK TO TRANSFUSE    Crossmatch Result      Compatible Performed at [facility], [place] [address], [place], [place] [address]   ABO/Rh     Status: None   Collection Time: 10/14/19  3:40 AM  Result Value Ref Range   ABO/RH(D)      O POS Performed at [facility] [address] [place], [place] [address]   MRSA PCR Screening     Status: Abnormal   Collection Time: 10/14/19  3:43 AM   Specimen: Nasopharyngeal  Result Value Ref Range   MRSA by PCR POSITIVE (A) NEGATIVE    Comment:        The GeneXpert MRSA Assay (FDA approved for NASAL specimens only), is one component of a comprehensive MRSA colonization surveillance program. It is not intended to diagnose MRSA infection nor to guide or monitor treatment for MRSA infections. RESULT CALLED TO, READ BACK BY AND VERIFIED WITH: CELSO RN 10/14/19 0513 JDW Performed at [facility] [address], [place], [place] [address]   Urinalysis, Routine w reflex microscopic     Status: Abnormal   Collection Time: 10/14/19  3:57 AM  Result Value Ref Range   Color, Urine [place] (A) YELLOW    Comment: BIOCHEMICALS MAY BE AFFECTED BY COLOR   APPearance HAZY (A) CLEAR   Specific Gravity, Urine 1.017 1.005 - 1.030   pH 5.0 5.0 - 8.0   Glucose, UA NEGATIVE NEGATIVE mg/dL   Hgb urine dipstick MODERATE (A) NEGATIVE   Bilirubin Urine MODERATE (A) NEGATIVE   Ketones, ur NEGATIVE NEGATIVE mg/dL   Protein, ur 100 (A) NEGATIVE mg/dL   Nitrite NEGATIVE NEGATIVE   Leukocytes,Ua NEGATIVE NEGATIVE   RBC / HPF 0-5 0 - 5 RBC/hpf   WBC, UA 11-20 0 - 5 WBC/hpf   Bacteria, UA FEW (A) NONE SEEN   Squamous Epithelial / LPF 0-5 0 - 5   Mucus PRESENT    Amorphous Crystal PRESENT     Comment: Performed at [facility], [address], [place], [place] [address]  Glucose, capillary     Status: Abnormal   Collection Time: 10/14/19  6:54 AM   Result Value Ref Range   Glucose-Capillary 130 (H) 70 - 99 mg/dL    Comment: Glucose reference range applies only to samples taken after fasting for at least 8 hours.  Glucose, capillary     Status: Abnormal   Collection Time: 10/14/19  7:45 AM  Result Value Ref Range   Glucose-Capillary 131 (H) 70 - 99 mg/dL    Comment: Glucose reference range applies only to samples taken after  fasting for at least 8 hours.  Glucose, capillary     Status: Abnormal   Collection Time: 10/14/19 12:02 PM  Result Value Ref Range   Glucose-Capillary 125 (H) 70 - 99 mg/dL    Comment: Glucose reference range applies only to samples taken after fasting for at least 8 hours.  CBC     Status: Abnormal   Collection Time: 10/14/19  2:32 PM  Result Value Ref Range   WBC 16.4 (H) 4.0 - 10.5 K/uL   RBC 2.43 (L) 4.22 - 5.81 MIL/uL   Hemoglobin 6.9 (LL) 13.0 - 17.0 g/dL    Comment: REPEATED TO VERIFY POST TRANSFUSION SPECIMEN CRITICAL VALUE NOTED.  VALUE IS CONSISTENT WITH PREVIOUSLY REPORTED AND CALLED VALUE.    HCT 21.6 (L) 39.0 - 52.0 %   MCV 88.9 80.0 - 100.0 fL   MCH 28.4 26.0 - 34.0 pg   MCHC 31.9 30.0 - 36.0 g/dL   RDW 17.9 (H) 11.5 - 15.5 %   Platelets 370 150 - 400 K/uL   nRBC 1.0 (H) 0.0 - 0.2 %    Comment: Performed at [facility] [address], [place], [place] [address]  Comprehensive metabolic panel     Status: Abnormal   Collection Time: 10/14/19  2:32 PM  Result Value Ref Range   Sodium 137 135 - 145 mmol/L   Potassium 4.6 3.5 - 5.1 mmol/L   Chloride 101 98 - 111 mmol/L   CO2 19 (L) 22 - 32 mmol/L   Glucose, Bld 131 (H) 70 - 99 mg/dL    Comment: Glucose reference range applies only to samples taken after fasting for at least 8 hours.   BUN 158 (H) 6 - 20 mg/dL   Creatinine, Ser 7.52 (H) 0.61 - 1.24 mg/dL   Calcium 9.0 8.9 - 10.3 mg/dL   Total Protein 6.8 6.5 - 8.1 g/dL   Albumin 1.6 (L) 3.5 - 5.0 g/dL   AST 227 (H) 15 - 41 U/L   ALT 101 (H) 0 - 44 U/L   Alkaline Phosphatase  901 (H) 38 - 126 U/L   Total Bilirubin 6.9 (H) 0.3 - 1.2 mg/dL   GFR calc non Af Amer 7 (L) >60 mL/min   GFR calc Af Amer 8 (L) >60 mL/min   Anion gap 17 (H) 5 - 15    Comment: Performed at [facility], [place] [address] [place], [place] [address]  Cortisol     Status: None   Collection Time: 10/14/19  2:32 PM  Result Value Ref Range   Cortisol, Plasma 22.6 ug/dL    Comment: (NOTE) AM    6.7 - 22.6 ug/dL PM   <10.0       ug/dL Performed at [facility] [address], [place], [place] [address]     CT LUMBAR SPINE WO CONTRAST  Result Date: 10/14/2019 CLINICAL DATA:  Low back pain for greater than 6 weeks with new liver lesions, concern for malignancy EXAM: CT LUMBAR SPINE WITHOUT CONTRAST TECHNIQUE: Multidetector CT imaging of the lumbar spine was performed without intravenous contrast administration. Multiplanar CT image reconstructions were also generated. COMPARISON:  Lumbar radiograph 11/25/2014 FINDINGS: Segmentation: 5 lumbar type vertebral bodies are present and appear normally formed. Alignment: Preservation of the normal lumbar lordosis. No spondylolysis or spondylolisthesis. Vertebrae: No acute fracture vertebral body height loss is seen. Few Schmorl's nodes are noted at the endplates about the B1-4 level and at the inferior L4 endplate as well.  No worrisome lytic or sclerotic lesions are present. Mild sclerotic changes at the superior endplate S1 are on a likely degenerative basis. Included portions of the sacrum reveal fusion across the SI joints. Paraspinal and other soft tissues: No paraspinal fluid, hemorrhage or soft tissue swelling is seen. Evaluation of the included portions of the abdomen reveal numerous hyperattenuating structures in both kidneys largest in the right measuring up to 2.6 cm and up to 70 Hounsfield units in attenuation. Larger fluid attenuation cyst is present in the posterior left kidney measuring up to 3.6 cm in size. There is atherosclerotic  calcification of the aorta and iliac arteries as included. Scattered colonic diverticula without evidence of acute diverticulitis. Heterogeneous nodule present in the left adrenal gland measuring up to 1.9 cm in size (4/33). Disc levels: Level by level evaluation of the lumbar spine below: T12-L1: Mild disc height loss. No significant posterior disc abnormality. Minimal facet degenerative change. No significant spinal canal or foraminal stenosis. L1-L2: Mild disc height loss with mild global disc bulge. Moderate facet degenerative changes. No significant spinal canal or foraminal stenosis. L2-L3: Mild disc height loss with mild global disc bulge. Moderate facet degenerative changes. No significant spinal canal or foraminal stenosis. L3-L4: Mild disc height loss and moderate global disc bulge and moderate facet degenerative changes with facet hypertrophy. Disc bulging results in narrowing of the subarticular recesses and contact of the traversing L4 nerve roots. Mild canal stenosis and mild bilateral foraminal narrowing. L4-L5: Mild disc height loss and moderate global disc bulge and moderate facet degenerative changes with facet hypertrophy. Disc bulging results in narrowing of the subarticular recesses and contact of the traversing L4 nerve roots. Mild canal stenosis with mild bilateral foraminal narrowing. L5-S1: Moderate disc height loss with global disc bulge. Calcified right central disc protrusion contacting the traversing S1 nerve root. Mild canal stenosis. Moderate left and mild right foraminal narrowing. IMPRESSION: 1. No acute fracture or vertebral body height loss of the lumbar spine. No suspicious osseous lesions. 2. Multilevel degenerative disc and facet degenerative changes throughout the lumbar spine as detailed level by level above. Most pronounced L3-S1. 3. Multiple hyperattenuating structures in both kidneys which may represent hyperdense cysts or solid masses. Consider further evaluation with  nonemergent renal ultrasound. 4. Heterogeneous left adrenal nodule measuring up to 1.9 cm in size. Worrisome for potential metastatic disease in the setting of known malignancy. 5. Colonic diverticulosis without evidence of acute diverticulitis. 6. Aortic Atherosclerosis (ICD10-I70.0). Electronically Signed   By: Lovena Le M.D.   On: 10/14/2019 00:05   Review of Systems  Unable to perform ROS: Other  Constitutional: Positive for activity change, appetite change and unexpected weight change.  HENT: Negative.   Eyes: Negative.   Respiratory: Negative.   Cardiovascular: Negative.   Gastrointestinal: Negative for abdominal distention, anal bleeding, blood in stool, constipation, diarrhea and nausea.  Endocrine: Negative.   Genitourinary: Negative.   Musculoskeletal: Positive for arthralgias and back pain.  Allergic/Immunologic: Negative.   Neurological: Negative.   Hematological: Negative.   Psychiatric/Behavioral: Positive for agitation, confusion and decreased concentration. Negative for behavioral problems, dysphoric mood, hallucinations and self-injury. The patient is not nervous/anxious and is not hyperactive.    Blood pressure 134/87, pulse 88, temperature 98.5 F (36.9 C), temperature source Oral, resp. rate 16, height 6\' 3"  (1.905 m), weight 111.6 kg, SpO2 100 %. Physical Exam  Constitutional: He appears well-developed and well-nourished.  HENT:  Head: Normocephalic and atraumatic.  Eyes: Pupils are equal, round, and reactive to  light. EOM are normal. Scleral icterus is present.  Prosthetic right eye  Cardiovascular: Normal rate and regular rhythm.  Respiratory: Effort normal and breath sounds normal.  GI: Soft. Bowel sounds are normal. He exhibits no distension and no mass. There is no abdominal tenderness. There is no rebound and no guarding.  Musculoskeletal:     Cervical back: Normal range of motion and neck supple.  Skin: Skin is warm and dry.  Psychiatric: His affect is  inappropriate. He is withdrawn.   Assessment/Plan: 1) Multiple liver lesions on abdominal ultrasound with cirrhotic liver and intrahepatic bili ductal dilatation the lesion in the posterior right hepatic lobe measuring 9.6 x 6.5 x 9.9 cm/abnormal weight loss/total bilirubin of 8.1 and alkaline phosphatase of 1068 with elevated AST and ALT of 265/123 respectively-MRCP being done today to rule out [facility] further recommendation made thereafter portal vein seems to be patent; Hepatitis B serology is negative and hepatitis C genotype could not be done because of insufficient RNA.  I do not feel he needs an ERCP at this time till we get the ER MRCP results or even after a PET scan. 2) Severe anemia with hemoglobin of 5 g/dL. 3) Severe malnutrition with ?30 to 60 lb weight loss over the last year. 4) Schizoaffective/bipolar disorder.  Juanita Craver 10/14/2019, 3:52 PM

## 2019-10-14 NOTE — Progress Notes (Signed)
Telephone call to Hilda Blades to informed that patient is moved to 3E16. Arthor Captain LPN

## 2019-10-14 NOTE — Progress Notes (Addendum)
PROGRESS NOTE    Ivan [name]  CHE:[id number]  DOB: [birth year]  PCP: Loman Brooklyn, FNP Admit date:10/13/2019  59 y.o. M with h/o  DM, HTN, schizophrenia, CKD4, BPD, liver cirrhosis presented to ED from oncology clinic after found to be hypotensive-BP 70/48.  Per sister, patient been weak, unable to stand, with back pain, poor p.o. intake, poor urinary output over the past 2 weeks, with a 60 pound weight loss over the past year.  Has had one episode of vomiting and loose stools.  She had also noticed yellowness of his eyes and skin. Patient had an outpatient right upper quadrant abdominal ultrasound that showed multiple hypoechoic lesions in the liver worrisome for neoplasm. He has 19.50 pack-year smoking history. He reports current alcohol use of about 2.0 standard drinks of alcohol per week.  ED Course: Afebrile, BP 84/55-113/74, 99% RA. Labs showed elevated liver enzymes AST 265, ALT 123, ALP 1068.  Total bilirubin 8.2. Creatinine elevated at 6.94.  EKG showed sinus rhythm. Left posterior fascicular block and prolonged Qtc at 530 ms.  1L Normal saline bolus given.  Hospital course: Patient admitted to [facility] for for further evaluation and management  Subjective:  Patient sleeping comfortably with no acute distress noted.  He appears tired, did not want to open his eyes but able to communicate and oriented x3.  He states his sister [name] is next of kin and healthcare POA.  Denies any melena or hematemesis  Objective: Vitals:   10/14/19 0513 10/14/19 0527 10/14/19 0602 10/14/19 0900  BP: 116/76 123/81 123/77 (!) 128/91  Pulse: 87 92 88 86  Resp: 18 16 18 18   Temp: 98.3 F (36.8 C) 98.2 F (36.8 C) 98.3 F (36.8 C) 97.6 F (36.4 C)  TempSrc: Oral Oral  Oral  SpO2: 100% 97% 97% 96%  Weight:      Height:        Intake/Output Summary (Last 24 hours) at 10/14/2019 0912 Last data filed at 10/14/2019 0602 Gross per 24 hour  Intake 1573.43 ml  Output 200 ml  Net 1373.43 ml    Filed Weights   10/13/19 1445  Weight: 111.6 kg    Physical Examination:  General exam: Appears calm and comfortable, icteric Respiratory system: Clear to auscultation. Respiratory effort normal. Cardiovascular system: S1 & S2 heard, RRR. No JVD, murmurs, rubs, gallops or clicks. No pedal edema. Gastrointestinal system: Abdomen is nondistended, soft and nontender. Normal BS Central nervous system: Awake and oriented x3 but appears tired. No new focal neurological deficits. Extremities: No contractures, edema or joint deformities.  Skin: + jaundice, no rashes, lesions or ulcers Psychiatry: Judgement and insight appear near normal. Mood & affect flat  Data Reviewed: I have personally reviewed following labs and imaging studies  CBC: Recent Labs  Lab 10/14/19 0145  WBC 14.5*  HGB 5.0*  HCT 16.2*  MCV 89.0  PLT 536   Basic Metabolic Panel: Recent Labs  Lab 10/13/19 1543 10/13/19 2150  NA 133*  --   K 4.5  --   CL 97*  --   CO2 18*  --   GLUCOSE 148*  --   BUN 128*  --   CREATININE 6.94*  --   CALCIUM 9.4  --   MG  --  3.0*   GFR: Estimated Creatinine Clearance: 16 mL/min (A) (by C-G formula based on SCr of 6.94 mg/dL (H)). Liver Function Tests: Recent Labs  Lab 10/13/19 1543  AST 265*  ALT 123*  ALKPHOS 1,068*  BILITOT 8.2*  PROT 7.8  ALBUMIN 2.2*   Recent Labs  Lab 10/13/19 1543  LIPASE 91*   Recent Labs  Lab 10/13/19 1543  AMMONIA 36*   Coagulation Profile: Recent Labs  Lab 10/13/19 2150  INR 2.7*   Cardiac Enzymes: No results for input(s): CKTOTAL, CKMB, CKMBINDEX, TROPONINI in the last 168 hours. BNP (last 3 results) No results for input(s): PROBNP in the last 8760 hours. HbA1C: Recent Labs    10/13/19 2150  HGBA1C 5.8*   CBG: Recent Labs  Lab 10/13/19 1604 10/13/19 2137 10/13/19 2359 10/14/19 0654 10/14/19 0745  GLUCAP 138* 151* 141* 130* 131*   Lipid Profile: No results for input(s): CHOL, HDL, LDLCALC, TRIG, CHOLHDL,  LDLDIRECT in the last 72 hours. Thyroid Function Tests: No results for input(s): TSH, T4TOTAL, FREET4, T3FREE, THYROIDAB in the last 72 hours. Anemia Panel: No results for input(s): VITAMINB12, FOLATE, FERRITIN, TIBC, IRON, RETICCTPCT in the last 72 hours. Sepsis Labs: No results for input(s): PROCALCITON, LATICACIDVEN in the last 168 hours.  Recent Results (from the past 240 hour(s))  SARS CORONAVIRUS 2 (TAT 6-24 HRS) Nasopharyngeal Nasopharyngeal Swab     Status: None   Collection Time: 10/13/19  5:41 PM   Specimen: Nasopharyngeal Swab  Result Value Ref Range Status   SARS Coronavirus 2 NEGATIVE NEGATIVE Final    Comment: (NOTE) SARS-CoV-2 target nucleic acids are NOT DETECTED. The SARS-CoV-2 RNA is generally detectable in upper and lower respiratory specimens during the acute phase of infection. Negative results do not preclude SARS-CoV-2 infection, do not rule out co-infections with other pathogens, and should not be used as the sole basis for treatment or other patient management decisions. Negative results must be combined with clinical observations, patient history, and epidemiological information. The expected result is Negative. Fact Sheet for Patients: SugarRoll.be Fact Sheet for Healthcare Providers: https://www.woods-mathews.com/ This test is not yet approved or cleared by the [place] FDA and  has been authorized for detection and/or diagnosis of SARS-CoV-2 by FDA under an Emergency Use Authorization (EUA). This EUA will remain  in effect (meaning this test can be used) for the duration of the COVID-19 declaration under Section 56 4(b)(1) of the Act, 21 U.S.C. section 360bbb-3(b)(1), unless the authorization is terminated or revoked sooner. Performed at [facility], [place] [address] [place], [place] [address]   MRSA PCR Screening     Status: Abnormal   Collection Time: 10/14/19  3:43 AM   Specimen: Nasopharyngeal    Result Value Ref Range Status   MRSA by PCR POSITIVE (A) NEGATIVE Final    Comment:        The GeneXpert MRSA Assay (FDA approved for NASAL specimens only), is one component of a comprehensive MRSA colonization surveillance program. It is not intended to diagnose MRSA infection nor to guide or monitor treatment for MRSA infections. RESULT CALLED TO, READ BACK BY AND VERIFIED WITH: CELSO RN 10/14/19 0513 JDW Performed at [facility] [address] [place], [place] [address]       Radiology Studies: CT LUMBAR SPINE WO CONTRAST  Result Date: 10/14/2019 CLINICAL DATA:  Low back pain for greater than 6 weeks with new liver lesions, concern for malignancy EXAM: CT LUMBAR SPINE WITHOUT CONTRAST TECHNIQUE: Multidetector CT imaging of the lumbar spine was performed without intravenous contrast administration. Multiplanar CT image reconstructions were also generated. COMPARISON:  Lumbar radiograph 11/25/2014 FINDINGS: Segmentation: 5 lumbar type vertebral bodies are present and appear normally formed. Alignment: Preservation of the  normal lumbar lordosis. No spondylolysis or spondylolisthesis. Vertebrae: No acute fracture vertebral body height loss is seen. Few Schmorl's nodes are noted at the endplates about the Z7-6 level and at the inferior L4 endplate as well. No worrisome lytic or sclerotic lesions are present. Mild sclerotic changes at the superior endplate S1 are on a likely degenerative basis. Included portions of the sacrum reveal fusion across the SI joints. Paraspinal and other soft tissues: No paraspinal fluid, hemorrhage or soft tissue swelling is seen. Evaluation of the included portions of the abdomen reveal numerous hyperattenuating structures in both kidneys largest in the right measuring up to 2.6 cm and up to 70 Hounsfield units in attenuation. Larger fluid attenuation cyst is present in the posterior left kidney measuring up to 3.6 cm in size. There is atherosclerotic  calcification of the aorta and iliac arteries as included. Scattered colonic diverticula without evidence of acute diverticulitis. Heterogeneous nodule present in the left adrenal gland measuring up to 1.9 cm in size (4/33). Disc levels: Level by level evaluation of the lumbar spine below: T12-L1: Mild disc height loss. No significant posterior disc abnormality. Minimal facet degenerative change. No significant spinal canal or foraminal stenosis. L1-L2: Mild disc height loss with mild global disc bulge. Moderate facet degenerative changes. No significant spinal canal or foraminal stenosis. L2-L3: Mild disc height loss with mild global disc bulge. Moderate facet degenerative changes. No significant spinal canal or foraminal stenosis. L3-L4: Mild disc height loss and moderate global disc bulge and moderate facet degenerative changes with facet hypertrophy. Disc bulging results in narrowing of the subarticular recesses and contact of the traversing L4 nerve roots. Mild canal stenosis and mild bilateral foraminal narrowing. L4-L5: Mild disc height loss and moderate global disc bulge and moderate facet degenerative changes with facet hypertrophy. Disc bulging results in narrowing of the subarticular recesses and contact of the traversing L4 nerve roots. Mild canal stenosis with mild bilateral foraminal narrowing. L5-S1: Moderate disc height loss with global disc bulge. Calcified right central disc protrusion contacting the traversing S1 nerve root. Mild canal stenosis. Moderate left and mild right foraminal narrowing. IMPRESSION: 1. No acute fracture or vertebral body height loss of the lumbar spine. No suspicious osseous lesions. 2. Multilevel degenerative disc and facet degenerative changes throughout the lumbar spine as detailed level by level above. Most pronounced L3-S1. 3. Multiple hyperattenuating structures in both kidneys which may represent hyperdense cysts or solid masses. Consider further evaluation with  nonemergent renal ultrasound. 4. Heterogeneous left adrenal nodule measuring up to 1.9 cm in size. Worrisome for potential metastatic disease in the setting of known malignancy. 5. Colonic diverticulosis without evidence of acute diverticulitis. 6. Aortic Atherosclerosis (ICD10-I70.0). Electronically Signed   By: Lovena Le M.D.   On: 10/14/2019 00:05        Scheduled Meds: . sodium chloride   Intravenous Once  . Chlorhexidine Gluconate Cloth  6 each Topical Q0600  . insulin aspart  0-9 Units Subcutaneous Q4H  . mupirocin ointment  1 application Nasal BID   Continuous Infusions: . sodium chloride 100 mL/hr at 10/13/19 2236    Assessment & Plan:   Acute on chronic renal failure stage IV with metabolic encephalopathy: in the setting of poor oral intake, hypotension, diuretic and ACEI use. Creatinine elevated 6.94, recent baseline creat 3.6-3.4. Recieved-1 L bolus, IV fluids NS 100cc/hr. Serial labs ordered this morning to monitor improvement--still with pending results  Jaundice/Hyperbilirubinemia/Liver lesions and cirrhosis- AST 265, ALT 123, ALP 1068 (ALP 740 in Jan).  Right upper quadrant ultrasound- multiple hypoechoic lesions.  Recent hepatitis panel check 10/01/2019, -insufficient hepatitis C virus RNA to obtain genotyping results, but otherwise panel was negative.Per oncology notes 2/23, patient will need ERCP and stenting as well as  PET/CT scan for further staging. GI on call,  Dr. Benson Norway consulted on admission-sent a message via epic regarding plan today.  Continue IV fluids.-He remains n.p.o. Obtain tumor markers, CA 19-9 and alpha-fetoprotein.  Acute on chronic normocytic anemia: Patients baseline Hgb appeared to have been 11-12 over the past few years. It was low at 8.2 in Jan 2021 and labs obtained overnight revealed further drop to 5.0. 2 units PRBC ordered overnight. Repeat post transfusion hgb. fe studies. Colon ca with mets on differential, although per sister his last  colonoscopy was within normal limits and done 4 years back at [facility].  Requested nurse to monitor for melena or hematochezia or hematemesis.  Will order IV PPI if noted to have any upper GI bleed signs. Iron studies/FOBT pending.  Await formal GI evaluation for further recommendations.   Back pain -acute worsening on chronic-- CT lumbar spine Wo contrast negative for acute fracture or osseous lesions.Multilevel DDD throughout the lumbar spine-most pronounced L3-S1. Multiple hyperattenuating structures in both kidneys also noted which may represent hyperdense cysts or solid masses-renal ultrasound for further evaluation.Heterogeneous left adrenal nodule measuring up to 1.9 cm in size worrisome for potential metastatic disease -Percocets as needed.  Start cortisol level ordered this morning-results still pending  Anion gap metabolic acidosis- likely due to AKI on CKD.  Anion gap 18, serum bicarb 18.Hydrate, follow cMP.  Elevated troponin- 90 > 102, denies chest pain.  EKG shows LPFB, sinus rhythm.  Likely due to demand ischemia from initial hypotension, and worsening renal insufficiency.- trend troponin  Prolonged QTC-  530- magnesium level at 3 and potassium 4.5, avoid QT prolonging agents.  Repeat EKG for follow-up  DM-random glucose 148.-Holding home Tradjenta for now given poor oral intake, weight loss. Monitor on SSI, check HgbA1c  HTN- hypotensive with AKI on presentation requiring IVF-Hold home labetalol, losartan, Lasix 40 mg  q12h, and Norvasc-IV fluids  Schizophrenia-  Stable,  home meds benztropine, Trileptal, trazodone on hold currently due to elevated LFTs, prolonged QTC and n.p.o. status.  DVT prophylaxis: SCD  Code Status: Full code Family / Patient Communication: D/W patient's healthcare proxy Ms. Enrique Sack and updated of all the above.  She understands her brother might have advanced malignancy.  She appreciated the call/updates and requested to be contacted  for any change in medical condition or procedures. Disposition Plan:   Patient is from home prior to hospitalization. Received/Receiving inpatient care for obstructive jaundice/concern for malignancy and need for invasive procedures Discharge when renal function improves, underlying diagnosis/ plan established (likely advanced malignancy).Expected LOS 3-5 days  Addendum 4:50 PM: Follow-up BMP shows worsening renal function with creatinine at 7.5 and BUN at 158. Bicarb 19 , Cortisol level at 22 and repeat Hgb 6.9. Renal ultrasonogram done but results pending.  Called and discussed with nephrologist on-call Dr. Royce Macadamia who will have their on-call team see patient tonight.   Video-sitter ordered for impulsiveness otherwise patient mentating okay. Repeat Hgb in an hour as just finished 2nd unit PRBC--if still <7, will need additional unit transfusion. Upgrade level of care to step down status. Updated patient's sister at bedside --she understands high cardiopulmonary risk of deterioration in the setting of anemia/renal failure/electrolyte imbalances/prolonged qtc and need for multiple transfusions/IV fluids. She stated she  will consider changing code status but would like some time to think--she will let me or on call staff know if she wishes to change code status. She is aware of pending nephrology eval and upgrade to progressive care unit for close monitoring. No evidence of malena/hematemesis while here.    LOS: 1 day    Time spent: 35 minutes  Additional contact time: 20 minutes  Guilford Shi, MD [facility] Pager in [place]  If 7PM-7AM, please contact night-coverage [email or web address] 10/14/2019, 9:12 AM

## 2019-10-15 ENCOUNTER — Inpatient Hospital Stay (HOSPITAL_COMMUNITY): Payer: Medicare HMO

## 2019-10-15 HISTORY — PX: IR US GUIDE VASC ACCESS RIGHT: IMG2390

## 2019-10-15 HISTORY — PX: IR FLUORO GUIDE CV LINE RIGHT: IMG2283

## 2019-10-15 LAB — COMPREHENSIVE METABOLIC PANEL
ALT: 83 U/L — ABNORMAL HIGH (ref 0–44)
AST: 156 U/L — ABNORMAL HIGH (ref 15–41)
Albumin: 1.5 g/dL — ABNORMAL LOW (ref 3.5–5.0)
Alkaline Phosphatase: 768 U/L — ABNORMAL HIGH (ref 38–126)
Anion gap: 19 — ABNORMAL HIGH (ref 5–15)
BUN: 173 mg/dL — ABNORMAL HIGH (ref 6–20)
CO2: 20 mmol/L — ABNORMAL LOW (ref 22–32)
Calcium: 8.9 mg/dL (ref 8.9–10.3)
Chloride: 100 mmol/L (ref 98–111)
Creatinine, Ser: 8.01 mg/dL — ABNORMAL HIGH (ref 0.61–1.24)
GFR calc Af Amer: 8 mL/min — ABNORMAL LOW (ref >60)
GFR calc non Af Amer: 7 mL/min — ABNORMAL LOW (ref >60)
Glucose, Bld: 134 mg/dL — ABNORMAL HIGH (ref 70–99)
Potassium: 4.6 mmol/L (ref 3.5–5.1)
Sodium: 139 mmol/L (ref 135–145)
Total Bilirubin: 5.8 mg/dL — ABNORMAL HIGH (ref 0.3–1.2)
Total Protein: 6.3 g/dL — ABNORMAL LOW (ref 6.5–8.1)

## 2019-10-15 LAB — CBC
HCT: 19.4 % — ABNORMAL LOW (ref 39.0–52.0)
Hemoglobin: 6.3 g/dL — CL (ref 13.0–17.0)
MCH: 28.9 pg (ref 26.0–34.0)
MCHC: 32.5 g/dL (ref 30.0–36.0)
MCV: 89 fL (ref 80.0–100.0)
Platelets: 318 10*3/uL (ref 150–400)
RBC: 2.18 MIL/uL — ABNORMAL LOW (ref 4.22–5.81)
RDW: 18.1 % — ABNORMAL HIGH (ref 11.5–15.5)
WBC: 17 10*3/uL — ABNORMAL HIGH (ref 4.0–10.5)
nRBC: 1.5 % — ABNORMAL HIGH (ref 0.0–0.2)

## 2019-10-15 LAB — CREATININE, URINE, RANDOM: Creatinine, Urine: 109.18 mg/dL

## 2019-10-15 LAB — GLUCOSE, CAPILLARY
Glucose-Capillary: 122 mg/dL — ABNORMAL HIGH (ref 70–99)
Glucose-Capillary: 127 mg/dL — ABNORMAL HIGH (ref 70–99)
Glucose-Capillary: 127 mg/dL — ABNORMAL HIGH (ref 70–99)
Glucose-Capillary: 127 mg/dL — ABNORMAL HIGH (ref 70–99)
Glucose-Capillary: 131 mg/dL — ABNORMAL HIGH (ref 70–99)
Glucose-Capillary: 165 mg/dL — ABNORMAL HIGH (ref 70–99)

## 2019-10-15 LAB — PROTIME-INR
INR: 3 — ABNORMAL HIGH (ref 0.8–1.2)
Prothrombin Time: 31 seconds — ABNORMAL HIGH (ref 11.4–15.2)

## 2019-10-15 LAB — AFP TUMOR MARKER
AFP, Serum, Tumor Marker: <0.9 ng/mL (ref 0.0–8.3)
AFP, Serum, Tumor Marker: 0.9 ng/mL (ref 0.0–8.3)

## 2019-10-15 LAB — HEMOGLOBIN AND HEMATOCRIT, BLOOD
HCT: 25.4 % — ABNORMAL LOW (ref 39.0–52.0)
Hemoglobin: 8.3 g/dL — ABNORMAL LOW (ref 13.0–17.0)

## 2019-10-15 LAB — SODIUM, URINE, RANDOM: Sodium, Ur: 13 mmol/L

## 2019-10-15 LAB — PREPARE RBC (CROSSMATCH)

## 2019-10-15 MED ORDER — SODIUM CHLORIDE 0.9 % IV SOLN: INTRAVENOUS | Status: DC

## 2019-10-15 MED ORDER — SODIUM CHLORIDE 0.9% IV SOLUTION: Freq: Once | INTRAVENOUS | Status: DC

## 2019-10-15 MED ORDER — LIDOCAINE HCL 1 % IJ SOLN
INTRAMUSCULAR | Status: AC
  Filled 2019-10-15: qty 20

## 2019-10-15 MED ORDER — CHLORHEXIDINE GLUCONATE CLOTH 2 % EX PADS: 6.0000 | MEDICATED_PAD | Freq: Every day | CUTANEOUS | Status: DC

## 2019-10-15 MED ORDER — LORAZEPAM 2 MG/ML IJ SOLN
1.0000 mg | Freq: Four times a day (QID) | INTRAMUSCULAR | Status: AC | PRN
  Administered 2019-10-16 (×2): 1 mg via INTRAVENOUS
  Filled 2019-10-15 (×2): qty 1

## 2019-10-15 MED ORDER — HEPARIN SODIUM (PORCINE) 1000 UNIT/ML IJ SOLN
INTRAMUSCULAR | Status: AC
  Administered 2019-10-15: 2.6 mL
  Filled 2019-10-15: qty 1

## 2019-10-15 MED ORDER — LORAZEPAM 2 MG/ML IJ SOLN
0.5000 mg | Freq: Once | INTRAMUSCULAR | Status: AC
  Administered 2019-10-15: 0.5 mg via INTRAVENOUS

## 2019-10-15 MED ORDER — LIDOCAINE HCL (PF) 1 % IJ SOLN
INTRAMUSCULAR | Status: DC | PRN
  Administered 2019-10-15: 10 mL

## 2019-10-15 MED ORDER — LORAZEPAM 2 MG/ML IJ SOLN
1.0000 mg | INTRAMUSCULAR | Status: AC | PRN
  Administered 2019-10-15 (×2): 1 mg via INTRAVENOUS
  Filled 2019-10-15 (×2): qty 1

## 2019-10-15 MED ORDER — CHLORHEXIDINE GLUCONATE CLOTH 2 % EX PADS
6.0000 | MEDICATED_PAD | Freq: Every day | CUTANEOUS | Status: DC
  Administered 2019-10-16: 6 via TOPICAL

## 2019-10-15 NOTE — CV Procedure (Signed)
2D echo attempted but RN stated he would be getting a foley. Will try echo later.

## 2019-10-15 NOTE — Progress Notes (Signed)
Patient not in room.  Unable to perform echo.  Will attempt later.

## 2019-10-15 NOTE — Progress Notes (Signed)
Paged Kamineni at [phone]. Please callback regarding MRCP and plan for HD cath insertion. THank you! Upon callback, informed MRCP not done and wanted to clarify if POA agreed, per MD POA agreed. Contacted MRI at 0912 to transport patient to MRCP.   Patient left unit for transport at 0943  Received call MRI approx 1016, patient restless unable to image.   Paged Kamineni at 1020.Pt.drowsy/asleep until put in MRI scanner.now restless & unable to get imaging.no more PRN available.callback. upon callback, give one time Ativan for MRI and place verbal order for PRN .5mg  Ativan  RN arrived to MRI approx 1035 to administer ativan, RN remained with patient. RN returned to unit with patient at 1144.  Per Kamineni bladder scan before foley insertion.   Paged Kamineni at 1320.Bladder scan 32mL. 155mL via condom cath.Will use 14 Fr foley.persistent tachypnea mid to hi 20's. O2 92% on RA. No callback.'  Patient somewhat arouseable, very drowsy, moans when name called/touch and/or pain.   Paged Kamineni at 1344. MEWS Alert:MEWS(red)of 4.HR low 100's,responds to voice/pain (drowsy),RR mid to hi 20's,92% on RA, BP 131/79. Upon callback at 1347; MD aware, no new orders. RN will continue to monitor.   Paged Kamineni at 1420.Unit protocol to perform 3 I&O cath before foley insertion. ok to maintain condom cath given minimal output? Upon callback, notify nephrology.   Paged Patel at 1428.0 mL via bladder scan @ 1316.100 mL via condom cath.ok keep condom cath given min output rather than foley? Upon callback, informed ok to maintain condom cath to avoid invasive foley insertion.  RN assist with transport of patient to IR at 1443. CBG assessed upon arrival to IR:CBG 122.   Left unit for HD at 1612. CBG assessed before transport; CBG 131.   Returned from IR at 1542. Drowsy, arouseable to voice/pain with moaning. Blood stopped at 1545.  Right trialysis IJ site assessed; gauze and surgical tape covering site, clean dry  and intact.  Called report to HD [place] at 1602. Will administer 1 unit of PRBC's in HD.   Paged Kamineni at [employer].Per report, restless in HD & attempt to pull at IJ;put in restraints,now removed .Reorder PRN Ativan?  Received handoff from [facility] in HD at [place]. Patient arrived back to unit from HD at 1855, drowsy. Spoke with Kamineni via phone; patient to remain FULL code, need EKG.   Paged Kamineni at [phone]. 1st EKG: QT/QTc-Baz 400/524 ms. 2nd EKG: QT/QTc-Baz 406/529 ms.   Approx 1930 spoke with patient sister [name] at bedside; informed of patient's status during day and RN shift change.

## 2019-10-15 NOTE — Procedures (Signed)
Interventional Radiology Procedure Note  Procedure: Placement of a right IJ approach temp trialysis.  16cm.  Tip is positioned at the superior cavoatrial junction and catheter is ready for immediate use.  May be converted if needed.   Complications: None Recommendations:  - Ok to use - Do not submerge - Routine line care   Signed,  Dulcy Fanny. Earleen Newport, DO

## 2019-10-15 NOTE — Progress Notes (Addendum)
[facility] Progress Note  Name: Ivan [name] MRN: [id number] DOB: 05-Jul-1961  Chief Complaint:  Hypotension   Subjective:  Patient got ativan for agitation around 6 am per nurse.  Was oriented to person before.  He was moved to [place] overnight.  Spoke with GI - strong concern for metastatic disease.  Pt didn't get MRCP yesterday.  Spoke with primary team and they are talking with family again about goals.  Review of systems:  Unable to obtain  --------- Background on consult:  Ivan [name] is an 59 y.o. male is a 59yo man with DM, HTN, Schizophrenia, BPH, cirrhosis and CKD 4 who is seen in consultation for evaluation and management of AKI on CKD.  Pt was admitted yesterday from oncology clinic for hypotension 70/40s.  He presented there for evaluation of liver lesions concerning for malignancy.  He presented outpt on background of 60lb wt loss over 1 year, jaundice and icterus.  RUQ US showed multiple echoic lesions on liver.  He's had poor po intake lately, weakness, inability to stand, back pain, some emesis and diarrhea.   He has a h/o CKD most recently CKD 4 with baseline in mid 3s.  Per notes he's been seen by [facility] in the past but recently declined f/u stating his PCP was following his CKD.  On presentation yesterday his labs showed BUN 128, Cr 6.94.  He was hydrated with I/Os of ~2.5L / 0.2L (all UOP) since admission.  Labs this afternoon showed worsening renal function with BUN 158, Cr 7.52.  He was anemic to Hb 5 on admission and rec'd 2u pRBC with f/u Hb 6.9.  ALP elevated 901 AST 227, ALT 101, Albumin 1.6, T bili 8.2.  Ca 19-9 and AFP are pending.  Renal US showed R 12.3, L 13.3 cm, echogenic.  > 10 cysts in each kidney, no hydronephrosis.  MRCP pending.  CT lumbar spine shows DJD, echogenic lesions in kidneys (c/w cysts on [place]), 1.9cm L adrenal nodule poss metastatic. GI consult is pending this evening.  Pt is sleepy but arousable.  Denies current complaints.  Says  food tastes 'so so', endorses dry mouth but not thirst.  No dyspnea or orthopnea.  His HCPOA, sister, is at bedside tonight.  She is aware of the situation and would like information from all consultants prior to making therapeutic decisions.  She says at baseline her brother lives independently and typically drives but recently lost license she thinks through a paperwork issue.    Intake/Output Summary (Last 24 hours) at 10/15/2019 0721 Last data filed at 10/15/2019 0200 Gross per 24 hour  Intake 1428.39 ml  Output --  Net 1428.39 ml    Vitals:  Vitals:   10/14/19 2023 10/14/19 2115 10/15/19 0010 10/15/19 0536  BP: 121/63   129/84  Pulse: 93   100  Resp: 18   20  Temp: 98 F (36.7 C)   97.6 F (36.4 C)  TempSrc:    Oral  SpO2: 98%   96%  Weight:  97.9 kg 97.4 kg   Height:  6\' 3"  (1.905 m)       Physical Exam:  General adult male in bed  HEENT normocephalic atraumatic  Neck supple trachea midline Lungs clear to auscultation bilaterally normal work of breathing at rest on room air  Heart S1S2 no rub  Abdomen soft nontender nondistended Extremities no edema  appreciated Neuro - arouses with aggressive stimulation and is s/p ativan per nursing but is nonverbal  on questioning  Medications reviewed    Labs:  BMP Latest Ref Rng & Units 10/15/2019 10/14/2019 10/13/2019  Glucose 70 - 99 mg/dL 134(H) 131(H) 148(H)  BUN 6 - 20 mg/dL 173(H) 158(H) 128(H)  Creatinine 0.61 - 1.24 mg/dL 8.01(H) 7.52(H) 6.94(H)  BUN/Creat Ratio 9 - 20 - - -  Sodium 135 - 145 mmol/L 139 137 133(L)  Potassium 3.5 - 5.1 mmol/L 4.6 4.6 4.5  Chloride 98 - 111 mmol/L 100 101 97(L)  CO2 22 - 32 mmol/L 20(L) 19(L) 18(L)  Calcium 8.9 - 10.3 mg/dL 8.9 9.0 9.4     Assessment/Plan:   # hepatitis, hyperbilirubinemia, liver lesions:  concern for metastatic malignancy, w/u underway; per primary and GI. MRCP was ordered but not performed.  She requested an update from GI   # severe oliguric AKI on CKD 4:   Presumably due to ATN in setting of hypotension on advanced CKD already. Has not improved with IV hydration and improvement in BP.  UA noted - mod blood on dip, not much on microscopy, mild proteinuria c/w prior UAs.   - Precipitous decline in renal function overnight despite fluids  - Place foley  - Consulting nephrologist felt pt may need short term dialysis - would offer if patient's sister agrees while his work-up is undergoing though note strong concern for metastatic disease  - Contacted his sister and asked her to call back the floor so that I can discuss with her.  She had previously wanted to get an update regarding his options and prognosis prior to making treatment - He appears quite frail and if has metastatic malignancy that isn't treatable would not be a long-term dialysis candidate.  Nephrology discussed this with his sister    # Encephalopathy  - profound uremia contributing - have contacted his sister about dialysis options   # Hypotension: resolved, cortisol normal.  Suspect hypovolemia.  Judicious fluids with oliguric AKI  # Metabolic acidosis: secondary to AKI on CKD. On bicarb gtt 75/hr.  # Normocytic Anemia:  S/p PRBC's with Hb up to 7.5 but down 6.3 on repeat this AM.  Additional blood has been ordered per primary  # renal cystic disease:  ? Etiology, not pertinent to acute discussion.  Does have FH of ESRD but sister doesn't think ADPKD.   Claudia Desanctis, MD 10/15/2019 8:13 AM  Spoke with sister who provided consent for dialysis while additional work-up is being done for his presumed malignancy which will allow her to make informed decisions about his care.  She understands that if metastatic disease that is not treatable that he is not a candidate for long-term dialysis.  Consulted IR for catheter placement.  Pt is NPO.  HD today and tomorrow for clearance and reassess needs daily.  Discussed with primary team   Claudia Desanctis 10/15/2019 8:28 AM

## 2019-10-15 NOTE — Progress Notes (Signed)
   Patient Status: [facility] - In-pt  Assessment and Plan: Patient in need of venous access.   Non tunneled dialysis catheter placement  ______________________________________________________________________   History of Present Illness: Ivan [name] is a 59 y.o. male   CKD 4; progressive renal failure Need for hemodialysis per Nephrology  INR 2.7 and wbc 17 Will place temporary cath for now Dr Royce Macadamia aware and agreeable  Allergies and medications reviewed.   Review of Systems: A 12 point ROS discussed and pertinent positives are indicated in the HPI above.  All other systems are negative.   Vital Signs: BP 122/70 (BP Location: Left Arm)   Pulse (!) 104   Temp 99 F (37.2 C) (Oral)   Resp 18   Ht 6\' 3"  (1.905 m)   Wt 214 lb 12.8 oz (97.4 kg) Comment: scale C  SpO2 96%   BMI 26.85 kg/m   Physical Exam Vitals reviewed.  Cardiovascular:     Rate and Rhythm: Regular rhythm.     Heart sounds: Normal heart sounds.  Pulmonary:     Breath sounds: Normal breath sounds.  Neurological:     Comments: Asleep; barely arouseable   Psychiatric:     Comments: Consented with sister Ivan [name] via phone      Imaging reviewed.   Labs:  COAGS: Recent Labs    10/13/19 2150  INR 2.7*    BMP: Recent Labs    09/15/19 1354 10/13/19 1543 10/14/19 1432 10/15/19 0502  NA 139 133* 137 139  K 3.3* 4.5 4.6 4.6  CL 102 97* 101 100  CO2 22 18* 19* 20*  GLUCOSE 118* 148* 131* 134*  BUN 30* 128* 158* 173*  CALCIUM 9.2 9.4 9.0 8.9  CREATININE 3.48* 6.94* 7.52* 8.01*  GFRNONAA 19* 8* 7* 7*  GFRAA 21* 9* 8* 8*    CKD4; progressive renal failure Need for dialysis per Nephrology Non tunneled for now; INR 2.7 and wbc 17 Pts sister is aware of procedure benefits and risks Agreeable to proceed Consent signed with sister Ivan [name] via phone   Electronically Signed: Lavonia Drafts, PA-C 10/15/2019, 9:34 AM   I spent a total of 15 minutes in face to face in clinical  consultation, greater than 50% of which was counseling/coordinating care for venous access.

## 2019-10-15 NOTE — Progress Notes (Signed)
Responded to PIV consult. On arrival, RN stated PIV was obtained. Consult cleared.

## 2019-10-15 NOTE — Progress Notes (Signed)
   10/15/19 1338  Vitals  Temp 98.3 F (36.8 C)  Temp Source Oral  BP 131/79  MAP (mmHg) 94  BP Location Right Arm  BP Method Automatic  Patient Position (if appropriate) Lying  Pulse Rate (!) 104  Resp (!) 25 (asleep, mouth breathing)  Oxygen Therapy  SpO2 92 %  O2 Device Room Air  MEWS Score  MEWS Temp 0  MEWS Systolic 0  MEWS Pulse 1  MEWS RR 1  MEWS LOC 1  MEWS Score 3  MEWS Score Color Yellow  MEWS Guidelines - (patients age 57 and over)   Paged Kamineni at 27. MEWS Alert:MEWS(red)of 4.HR low 100's,responds to voice/pain (drowsy),RR mid to hi 20's,92% on RA, BP 131/79. Upon callback at 1347; MD aware, no new orders. RN will continue to monitor.

## 2019-10-15 NOTE — Progress Notes (Signed)
1 unit of blood transfused in dialysis. Patient tolerated administration well. No signs or symptoms of blood transfusion reaction observed.

## 2019-10-15 NOTE — Progress Notes (Signed)
Subjective: See below.  Objective: Vital signs in last 24 hours: Temp:  [97.6 F (36.4 C)-99.1 F (37.3 C)] 98.2 F (36.8 C) (02/25 1546) Pulse Rate:  [93-109] 107 (02/25 1700) Resp:  [18-28] 24 (02/25 1546) BP: (118-145)/(63-84) 145/80 (02/25 1700) SpO2:  [91 %-98 %] 92 % (02/25 1546) Weight:  [97.4 kg-97.9 kg] 97.4 kg (02/25 0010) Last BM Date: 10/15/19  Intake/Output from previous day: 02/24 0701 - 02/25 0700 In: 1428.4 [I.V.:426.2; Blood:1002.2] Out: -  Intake/Output this shift: Total I/O In: 315 [Blood:315] Out: 100 [Urine:100]  General appearance: agitated and disorientated GI: soft, non-tender; bowel sounds normal; no masses,  no organomegaly  Lab Results: Recent Labs    10/14/19 0145 10/14/19 0145 10/14/19 1432 10/14/19 1851 10/15/19 0502  WBC 14.5*  --  16.4*  --  17.0*  HGB 5.0*   < > 6.9* 7.5* 6.3*  HCT 16.2*   < > 21.6* 23.6* 19.4*  PLT 383  --  370  --  318   < > = values in this interval not displayed.   BMET Recent Labs    10/13/19 1543 10/14/19 1432 10/15/19 0502  NA 133* 137 139  K 4.5 4.6 4.6  CL 97* 101 100  CO2 18* 19* 20*  GLUCOSE 148* 131* 134*  BUN 128* 158* 173*  CREATININE 6.94* 7.52* 8.01*  CALCIUM 9.4 9.0 8.9   LFT Recent Labs    10/15/19 0502  PROT 6.3*  ALBUMIN 1.5*  AST 156*  ALT 83*  ALKPHOS 768*  BILITOT 5.8*   PT/INR Recent Labs    10/13/19 2150 10/15/19 0920  LABPROT 28.3* 31.0*  INR 2.7* 3.0*   Hepatitis Panel No results for input(s): HEPBSAG, HCVAB, HEPAIGM, HEPBIGM in the last 72 hours. C-Diff No results for input(s): CDIFFTOX in the last 72 hours. Fecal Lactopherrin No results for input(s): FECLLACTOFRN in the last 72 hours.  Studies/Results: CT LUMBAR SPINE WO CONTRAST  Result Date: 10/14/2019 CLINICAL DATA:  Low back pain for greater than 6 weeks with new liver lesions, concern for malignancy EXAM: CT LUMBAR SPINE WITHOUT CONTRAST TECHNIQUE: Multidetector CT imaging of the lumbar spine was  performed without intravenous contrast administration. Multiplanar CT image reconstructions were also generated. COMPARISON:  Lumbar radiograph 11/25/2014 FINDINGS: Segmentation: 5 lumbar type vertebral bodies are present and appear normally formed. Alignment: Preservation of the normal lumbar lordosis. No spondylolysis or spondylolisthesis. Vertebrae: No acute fracture vertebral body height loss is seen. Few Schmorl's nodes are noted at the endplates about the M0-8 level and at the inferior L4 endplate as well. No worrisome lytic or sclerotic lesions are present. Mild sclerotic changes at the superior endplate S1 are on a likely degenerative basis. Included portions of the sacrum reveal fusion across the SI joints. Paraspinal and other soft tissues: No paraspinal fluid, hemorrhage or soft tissue swelling is seen. Evaluation of the included portions of the abdomen reveal numerous hyperattenuating structures in both kidneys largest in the right measuring up to 2.6 cm and up to 70 Hounsfield units in attenuation. Larger fluid attenuation cyst is present in the posterior left kidney measuring up to 3.6 cm in size. There is atherosclerotic calcification of the aorta and iliac arteries as included. Scattered colonic diverticula without evidence of acute diverticulitis. Heterogeneous nodule present in the left adrenal gland measuring up to 1.9 cm in size (4/33). Disc levels: Level by level evaluation of the lumbar spine below: T12-L1: Mild disc height loss. No significant posterior disc abnormality. Minimal facet degenerative change. No  significant spinal canal or foraminal stenosis. L1-L2: Mild disc height loss with mild global disc bulge. Moderate facet degenerative changes. No significant spinal canal or foraminal stenosis. L2-L3: Mild disc height loss with mild global disc bulge. Moderate facet degenerative changes. No significant spinal canal or foraminal stenosis. L3-L4: Mild disc height loss and moderate global  disc bulge and moderate facet degenerative changes with facet hypertrophy. Disc bulging results in narrowing of the subarticular recesses and contact of the traversing L4 nerve roots. Mild canal stenosis and mild bilateral foraminal narrowing. L4-L5: Mild disc height loss and moderate global disc bulge and moderate facet degenerative changes with facet hypertrophy. Disc bulging results in narrowing of the subarticular recesses and contact of the traversing L4 nerve roots. Mild canal stenosis with mild bilateral foraminal narrowing. L5-S1: Moderate disc height loss with global disc bulge. Calcified right central disc protrusion contacting the traversing S1 nerve root. Mild canal stenosis. Moderate left and mild right foraminal narrowing. IMPRESSION: 1. No acute fracture or vertebral body height loss of the lumbar spine. No suspicious osseous lesions. 2. Multilevel degenerative disc and facet degenerative changes throughout the lumbar spine as detailed level by level above. Most pronounced L3-S1. 3. Multiple hyperattenuating structures in both kidneys which may represent hyperdense cysts or solid masses. Consider further evaluation with nonemergent renal ultrasound. 4. Heterogeneous left adrenal nodule measuring up to 1.9 cm in size. Worrisome for potential metastatic disease in the setting of known malignancy. 5. Colonic diverticulosis without evidence of acute diverticulitis. 6. Aortic Atherosclerosis (ICD10-I70.0). Electronically Signed   By: Lovena Le M.D.   On: 10/14/2019 00:05   US RENAL  Result Date: 10/14/2019 CLINICAL DATA:  Renal mass on CT EXAM: RENAL / URINARY TRACT ULTRASOUND COMPLETE COMPARISON:  CT 10/13/2019, ultrasound 09/22/2019 FINDINGS: Right Kidney: Renal measurements: 12.3 x 6 x 5.7 cm = volume: 222.3 mL. Echogenic cortex. No hydronephrosis. Numerous, greater than 10 cysts in the right kidney. The largest are measured. Midpole cyst measures 4 x 3.9 x 3.7 cm. Upper pole cyst measures 3.3 x  3.7 x 3.2 cm. Left Kidney: Renal measurements: 13.3 x 5.8 x 6.5 cm = volume: 259.7 mL. Echogenic cortex. No hydronephrosis. Multiple cysts, greater than 10. The largest were measured. Midpole cyst measures 3.2 x 2.9 x 3 cm. Upper pole cyst measures 5.6 x 5.8 x 6.3 cm. Bladder: Appears normal for degree of bladder distention. Other: Multiple hypoechoic solid masses in the liver. IMPRESSION: 1. Echogenic kidneys bilaterally consistent with medical renal disease. No hydronephrosis. 2. Multiple cysts within the bilateral kidneys, greater than 10 within each kidney. No solid masses by sonography. 3. Multiple hypoechoic solid masses in the liver concerning for metastatic disease. Electronically Signed   By: Donavan Foil M.D.   On: 10/14/2019 17:19   MR ABDOMEN MRCP WO CONTRAST  Result Date: 10/15/2019 CLINICAL DATA:  Evaluate multiple liver lesions identified EXAM: MRI ABDOMEN WITHOUT CONTRAST  (INCLUDING MRCP) TECHNIQUE: Multiplanar multisequence MR imaging of the abdomen was performed. Heavily T2-weighted images of the biliary and pancreatic ducts were obtained, and three-dimensional MRCP images were rendered by post processing. COMPARISON:  Ultrasound limited 09/21/2018 FINDINGS: Markedly diminished exam detail due to motion artifact. According to the sonographer the patient is confused, disoriented and is unable to remain still despite being sedated twice. Lower chest: No acute abnormality. Hepatobiliary: Extensive, multifocal liver lesions are identified throughout both lobes. Dominant confluent mass within the right hepatic lobe measures at least 11.3 cm, image 26/14. Findings are highly concerning for diffuse liver  metastases. No gallstones are identified. There is increase caliber of the common bile duct which measures up to 1.8 cm. Abrupt cut off of the CBD at the level of the pancreas is noted, image 31/8. Pancreas: The main pancreatic duct appears dilated. A large soft tissue mass in the region of the head  of pancreas is identified, indeterminate and incompletely characterized due to motion artifact and lack of IV contrast material. This measures approximately 6.6 x 6.6 cm, image 84/1200. Spleen:  Within normal limits in size and appearance. Adrenals/Urinary Tract: Adrenal gland suboptimally visualized. Innumerable bilateral kidney cysts of varying complexity are identified. These are all incompletely characterized due to motion artifact and lack of IV contrast material. No hydronephrosis noted. Stomach/Bowel: The stomach appears distended. The bowel loops are suboptimally evaluated due to motion. Vascular/Lymphatic:  Normal caliber abdominal aorta. Extensive retroperitoneal adenopathy is noted. Index left periaortic node measures 2.2 cm short axis, image 100/12. Index retrocaval node measures 1.7 cm, image 95/12. Index porta hepatic lymph node has a short axis of 3.8 cm. Other:  Small volume of perihepatic free fluid noted. Musculoskeletal: Areas of increased T2 signal noted throughout the visualized osseous structures concerning for diffuse osseous metastasis. IMPRESSION: 1. Markedly diminished exam detail due to motion artifact and lack of IV contrast material. Repeat cross-sectional imaging of the abdomen following resolution of the patient's current clinical issues is strongly recommended. The patient should be above the remain motionless and perform breath hold in ordered to achieve diagnostic MR imaging of the abdomen. Alternatively, a CT may be considered as this is less susceptible to motion artifact. 2. Exam compare most presence of extensive liver lesions involving both lobes, too numerous to count. These are suspicious for metastatic disease. 3. Retroperitoneal adenopathy. 4. There is dilation of the pancreatic duct and common bile duct. A mass is suspected in the region of the head of pancreas which may represent a primary pancreatic neoplasm or peripancreatic/porta hepatic adenopathy. These cannot be  distinguished due to factors described above. 5. Suspect multifocal bone metastasis. Electronically Signed   By: Kerby Moors M.D.   On: 10/15/2019 12:51    Medications:  Scheduled: . sodium chloride   Intravenous Once  . Chlorhexidine Gluconate Cloth  6 each Topical Q0600  . [START ON 10/16/2019] Chlorhexidine Gluconate Cloth  6 each Topical Q0600  . insulin aspart  0-9 Units Subcutaneous Q4H  . lidocaine      . mupirocin ointment  1 application Nasal BID   Continuous:   Assessment/Plan: 1) Widely metastatic disease.  ? Pancreatic cancer. 2) Coagulopathy. 3) AMS. 4) Cirrhosis. 5) Renal failure.   I had a very long discussion with the patient's sister, who is his health care power of attorney.  I explained the results of the MRI in that he has widely metastatic disease.  There is a large head of the pancreas mass that is obstructing his biliary tract.  The patient is coagulopathic and he has renal failure.  The patient is undergoing dialysis and he remains agitated.  Soft restraints were applied in dialysis as he was trying to pull out the catheter.  I explained to his sister that the patient's condition is terminal and comfort care should be considered.  She understood my position, but she desires to speak to other staff involved in his care to obtain more information.  Also, she will visit her brother tonight to assess his mental status.  My concern from the GI standpoint is the uncertainty of any true  benefit with a stent placement in such a critical setting.  Stents are placed for palliative cases, but those other cases do not exhibit coagulopathy, renal failure, AMS, etc concurrently.  At this time he will remain on the schedule for a stent placement.  Also, placement of stents in malignant cases are the most difficult cases.  LOS: 2 days   Glendene Wyer D 10/15/2019, 5:24 PM

## 2019-10-15 NOTE — Progress Notes (Addendum)
PROGRESS NOTE    Ivan [name]  VVO:[id number]  DOB: 1961/02/09  PCP: Loman Brooklyn, FNP Admit date:10/13/2019  59 y.o. M with h/o  DM, HTN, schizophrenia, CKD4, BPD, liver cirrhosis presented to ED from oncology clinic after found to be hypotensive-BP 70/48.  Per sister, patient been weak, unable to stand, with back pain, poor p.o. intake, poor urinary output over the past 2 weeks, with a 60 pound weight loss over the past year.  Has had one episode of vomiting and loose stools.  She had also noticed yellowness of his eyes and skin. Patient had an outpatient right upper quadrant abdominal ultrasound that showed multiple hypoechoic lesions in the liver worrisome for neoplasm. He has 19.50 pack-year smoking history. He reports current alcohol use of about 2.0 standard drinks of alcohol per week.  ED Course: Afebrile, BP 84/55-113/74, 99% RA. Labs showed elevated liver enzymes AST 265, ALT 123, ALP 1068.  Total bilirubin 8.2. Creatinine elevated at 6.94.  EKG showed sinus rhythm. Left posterior fascicular block and prolonged Qtc at 530 ms.  1L Normal saline bolus given.  Hospital course: Patient admitted to [facility] for for further evaluation and management  Subjective:  Patient agitated overnight and received 2 mg Ativan.  Still restless this morning and required additional 1 mg Ativan prior to MRI.  Ordered for 2 units of PRBC based on a.m. labs showing drop in hemoglobin to 6.3.  Objective: Vitals:   10/15/19 1630 10/15/19 1700 10/15/19 1730 10/15/19 1800  BP: 124/73 (!) 145/80 (!) 143/78 (!) 144/82  Pulse: (!) 105 (!) 107 (!) 111 100  Resp:      Temp:      TempSrc:      SpO2:      Weight:      Height:        Intake/Output Summary (Last 24 hours) at 10/15/2019 1833 Last data filed at 10/15/2019 1820 Gross per 24 hour  Intake 741.22 ml  Output 100 ml  Net 641.22 ml   Filed Weights   10/13/19 1445 10/14/19 2115 10/15/19 0010  Weight: 111.6 kg 97.9 kg 97.4 kg    Physical  Examination:  General exam: Appears calm and comfortable, icteric Respiratory system: Clear to auscultation. Respiratory effort normal. Cardiovascular system: S1 & S2 heard, RRR. No JVD, murmurs, rubs, gallops or clicks. No pedal edema. Gastrointestinal system: Abdomen is nondistended, soft and nontender. Normal BS Central nervous system: Drowsy/somnolent from sedatives.  No new focal neurological deficits. Extremities: No contractures, edema or joint deformities.  Skin: + jaundice, no rashes, lesions or ulcers Psychiatry: Judgement and insight impaired.  Data Reviewed: I have personally reviewed following labs and imaging studies  CBC: Recent Labs  Lab 10/14/19 0145 10/14/19 1432 10/14/19 1851 10/15/19 0502  WBC 14.5* 16.4*  --  17.0*  HGB 5.0* 6.9* 7.5* 6.3*  HCT 16.2* 21.6* 23.6* 19.4*  MCV 89.0 88.9  --  89.0  PLT 383 370  --  269   Basic Metabolic Panel: Recent Labs  Lab 10/13/19 1543 10/13/19 2150 10/14/19 1432 10/15/19 0502  NA 133*  --  137 139  K 4.5  --  4.6 4.6  CL 97*  --  101 100  CO2 18*  --  19* 20*  GLUCOSE 148*  --  131* 134*  BUN 128*  --  158* 173*  CREATININE 6.94*  --  7.52* 8.01*  CALCIUM 9.4  --  9.0 8.9  MG  --  3.0*  --   --  GFR: Estimated Creatinine Clearance: 12.3 mL/min (A) (by C-G formula based on SCr of 8.01 mg/dL (H)). Liver Function Tests: Recent Labs  Lab 10/13/19 1543 10/14/19 1432 10/15/19 0502  AST 265* 227* 156*  ALT 123* 101* 83*  ALKPHOS 1,068* 901* 768*  BILITOT 8.2* 6.9* 5.8*  PROT 7.8 6.8 6.3*  ALBUMIN 2.2* 1.6* 1.5*   Recent Labs  Lab 10/13/19 1543  LIPASE 91*   Recent Labs  Lab 10/13/19 1543  AMMONIA 36*   Coagulation Profile: Recent Labs  Lab 10/13/19 2150 10/15/19 0920  INR 2.7* 3.0*   Cardiac Enzymes: No results for input(s): CKTOTAL, CKMB, CKMBINDEX, TROPONINI in the last 168 hours. BNP (last 3 results) No results for input(s): PROBNP in the last 8760 hours. HbA1C: Recent Labs     10/13/19 2150  HGBA1C 5.8*   CBG: Recent Labs  Lab 10/15/19 0021 10/15/19 0505 10/15/19 0736 10/15/19 1458 10/15/19 1610  GLUCAP 165* 127* 127* 122* 131*   Lipid Profile: No results for input(s): CHOL, HDL, LDLCALC, TRIG, CHOLHDL, LDLDIRECT in the last 72 hours. Thyroid Function Tests: No results for input(s): TSH, T4TOTAL, FREET4, T3FREE, THYROIDAB in the last 72 hours. Anemia Panel: Recent Labs    10/14/19 1432  FERRITIN 4,420*  TIBC 210*  IRON 49   Sepsis Labs: No results for input(s): PROCALCITON, LATICACIDVEN in the last 168 hours.  Recent Results (from the past 240 hour(s))  SARS CORONAVIRUS 2 (TAT 6-24 HRS) Nasopharyngeal Nasopharyngeal Swab     Status: None   Collection Time: 10/13/19  5:41 PM   Specimen: Nasopharyngeal Swab  Result Value Ref Range Status   SARS Coronavirus 2 NEGATIVE NEGATIVE Final    Comment: (NOTE) SARS-CoV-2 target nucleic acids are NOT DETECTED. The SARS-CoV-2 RNA is generally detectable in upper and lower respiratory specimens during the acute phase of infection. Negative results do not preclude SARS-CoV-2 infection, do not rule out co-infections with other pathogens, and should not be used as the sole basis for treatment or other patient management decisions. Negative results must be combined with clinical observations, patient history, and epidemiological information. The expected result is Negative. Fact Sheet for Patients: SugarRoll.be Fact Sheet for Healthcare Providers: https://www.woods-mathews.com/ This test is not yet approved or cleared by the [place] FDA and  has been authorized for detection and/or diagnosis of SARS-CoV-2 by FDA under an Emergency Use Authorization (EUA). This EUA will remain  in effect (meaning this test can be used) for the duration of the COVID-19 declaration under Section 56 4(b)(1) of the Act, 21 U.S.C. section 360bbb-3(b)(1), unless the authorization is  terminated or revoked sooner. Performed at [facility], [place] [address] [place], [place] [address]   MRSA PCR Screening     Status: Abnormal   Collection Time: 10/14/19  3:43 AM   Specimen: Nasopharyngeal  Result Value Ref Range Status   MRSA by PCR POSITIVE (A) NEGATIVE Final    Comment:        The GeneXpert MRSA Assay (FDA approved for NASAL specimens only), is one component of a comprehensive MRSA colonization surveillance program. It is not intended to diagnose MRSA infection nor to guide or monitor treatment for MRSA infections. RESULT CALLED TO, READ BACK BY AND VERIFIED WITH: CELSO RN 10/14/19 0513 JDW Performed at [facility] [address] [place], [place] [address]       Radiology Studies: CT LUMBAR SPINE WO CONTRAST  Result Date: 10/14/2019 CLINICAL DATA:  Low back pain for greater than 6  weeks with new liver lesions, concern for malignancy EXAM: CT LUMBAR SPINE WITHOUT CONTRAST TECHNIQUE: Multidetector CT imaging of the lumbar spine was performed without intravenous contrast administration. Multiplanar CT image reconstructions were also generated. COMPARISON:  Lumbar radiograph 11/25/2014 FINDINGS: Segmentation: 5 lumbar type vertebral bodies are present and appear normally formed. Alignment: Preservation of the normal lumbar lordosis. No spondylolysis or spondylolisthesis. Vertebrae: No acute fracture vertebral body height loss is seen. Few Schmorl's nodes are noted at the endplates about the Z6-1 level and at the inferior L4 endplate as well. No worrisome lytic or sclerotic lesions are present. Mild sclerotic changes at the superior endplate S1 are on a likely degenerative basis. Included portions of the sacrum reveal fusion across the SI joints. Paraspinal and other soft tissues: No paraspinal fluid, hemorrhage or soft tissue swelling is seen. Evaluation of the included portions of the abdomen reveal numerous hyperattenuating structures in both kidneys  largest in the right measuring up to 2.6 cm and up to 70 Hounsfield units in attenuation. Larger fluid attenuation cyst is present in the posterior left kidney measuring up to 3.6 cm in size. There is atherosclerotic calcification of the aorta and iliac arteries as included. Scattered colonic diverticula without evidence of acute diverticulitis. Heterogeneous nodule present in the left adrenal gland measuring up to 1.9 cm in size (4/33). Disc levels: Level by level evaluation of the lumbar spine below: T12-L1: Mild disc height loss. No significant posterior disc abnormality. Minimal facet degenerative change. No significant spinal canal or foraminal stenosis. L1-L2: Mild disc height loss with mild global disc bulge. Moderate facet degenerative changes. No significant spinal canal or foraminal stenosis. L2-L3: Mild disc height loss with mild global disc bulge. Moderate facet degenerative changes. No significant spinal canal or foraminal stenosis. L3-L4: Mild disc height loss and moderate global disc bulge and moderate facet degenerative changes with facet hypertrophy. Disc bulging results in narrowing of the subarticular recesses and contact of the traversing L4 nerve roots. Mild canal stenosis and mild bilateral foraminal narrowing. L4-L5: Mild disc height loss and moderate global disc bulge and moderate facet degenerative changes with facet hypertrophy. Disc bulging results in narrowing of the subarticular recesses and contact of the traversing L4 nerve roots. Mild canal stenosis with mild bilateral foraminal narrowing. L5-S1: Moderate disc height loss with global disc bulge. Calcified right central disc protrusion contacting the traversing S1 nerve root. Mild canal stenosis. Moderate left and mild right foraminal narrowing. IMPRESSION: 1. No acute fracture or vertebral body height loss of the lumbar spine. No suspicious osseous lesions. 2. Multilevel degenerative disc and facet degenerative changes throughout the  lumbar spine as detailed level by level above. Most pronounced L3-S1. 3. Multiple hyperattenuating structures in both kidneys which may represent hyperdense cysts or solid masses. Consider further evaluation with nonemergent renal ultrasound. 4. Heterogeneous left adrenal nodule measuring up to 1.9 cm in size. Worrisome for potential metastatic disease in the setting of known malignancy. 5. Colonic diverticulosis without evidence of acute diverticulitis. 6. Aortic Atherosclerosis (ICD10-I70.0). Electronically Signed   By: Lovena Le M.D.   On: 10/14/2019 00:05   US RENAL  Result Date: 10/14/2019 CLINICAL DATA:  Renal mass on CT EXAM: RENAL / URINARY TRACT ULTRASOUND COMPLETE COMPARISON:  CT 10/13/2019, ultrasound 09/22/2019 FINDINGS: Right Kidney: Renal measurements: 12.3 x 6 x 5.7 cm = volume: 222.3 mL. Echogenic cortex. No hydronephrosis. Numerous, greater than 10 cysts in the right kidney. The largest are measured. Midpole cyst measures 4 x 3.9 x 3.7 cm. Upper  pole cyst measures 3.3 x 3.7 x 3.2 cm. Left Kidney: Renal measurements: 13.3 x 5.8 x 6.5 cm = volume: 259.7 mL. Echogenic cortex. No hydronephrosis. Multiple cysts, greater than 10. The largest were measured. Midpole cyst measures 3.2 x 2.9 x 3 cm. Upper pole cyst measures 5.6 x 5.8 x 6.3 cm. Bladder: Appears normal for degree of bladder distention. Other: Multiple hypoechoic solid masses in the liver. IMPRESSION: 1. Echogenic kidneys bilaterally consistent with medical renal disease. No hydronephrosis. 2. Multiple cysts within the bilateral kidneys, greater than 10 within each kidney. No solid masses by sonography. 3. Multiple hypoechoic solid masses in the liver concerning for metastatic disease. Electronically Signed   By: Donavan Foil M.D.   On: 10/14/2019 17:19   MR ABDOMEN MRCP WO CONTRAST  Result Date: 10/15/2019 CLINICAL DATA:  Evaluate multiple liver lesions identified EXAM: MRI ABDOMEN WITHOUT CONTRAST  (INCLUDING MRCP) TECHNIQUE:  Multiplanar multisequence MR imaging of the abdomen was performed. Heavily T2-weighted images of the biliary and pancreatic ducts were obtained, and three-dimensional MRCP images were rendered by post processing. COMPARISON:  Ultrasound limited 09/21/2018 FINDINGS: Markedly diminished exam detail due to motion artifact. According to the sonographer the patient is confused, disoriented and is unable to remain still despite being sedated twice. Lower chest: No acute abnormality. Hepatobiliary: Extensive, multifocal liver lesions are identified throughout both lobes. Dominant confluent mass within the right hepatic lobe measures at least 11.3 cm, image 26/14. Findings are highly concerning for diffuse liver metastases. No gallstones are identified. There is increase caliber of the common bile duct which measures up to 1.8 cm. Abrupt cut off of the CBD at the level of the pancreas is noted, image 31/8. Pancreas: The main pancreatic duct appears dilated. A large soft tissue mass in the region of the head of pancreas is identified, indeterminate and incompletely characterized due to motion artifact and lack of IV contrast material. This measures approximately 6.6 x 6.6 cm, image 84/1200. Spleen:  Within normal limits in size and appearance. Adrenals/Urinary Tract: Adrenal gland suboptimally visualized. Innumerable bilateral kidney cysts of varying complexity are identified. These are all incompletely characterized due to motion artifact and lack of IV contrast material. No hydronephrosis noted. Stomach/Bowel: The stomach appears distended. The bowel loops are suboptimally evaluated due to motion. Vascular/Lymphatic:  Normal caliber abdominal aorta. Extensive retroperitoneal adenopathy is noted. Index left periaortic node measures 2.2 cm short axis, image 100/12. Index retrocaval node measures 1.7 cm, image 95/12. Index porta hepatic lymph node has a short axis of 3.8 cm. Other:  Small volume of perihepatic free fluid  noted. Musculoskeletal: Areas of increased T2 signal noted throughout the visualized osseous structures concerning for diffuse osseous metastasis. IMPRESSION: 1. Markedly diminished exam detail due to motion artifact and lack of IV contrast material. Repeat cross-sectional imaging of the abdomen following resolution of the patient's current clinical issues is strongly recommended. The patient should be above the remain motionless and perform breath hold in ordered to achieve diagnostic MR imaging of the abdomen. Alternatively, a CT may be considered as this is less susceptible to motion artifact. 2. Exam compare most presence of extensive liver lesions involving both lobes, too numerous to count. These are suspicious for metastatic disease. 3. Retroperitoneal adenopathy. 4. There is dilation of the pancreatic duct and common bile duct. A mass is suspected in the region of the head of pancreas which may represent a primary pancreatic neoplasm or peripancreatic/porta hepatic adenopathy. These cannot be distinguished due to factors described above. 5.  Suspect multifocal bone metastasis. Electronically Signed   By: Kerby Moors M.D.   On: 10/15/2019 12:51   IR Fluoro Guide CV Line Right  Result Date: 10/15/2019 INDICATION: 59 year old male referred for placement of hemodialysis catheter EXAM: IMAGE GUIDED PLACEMENT OF RIGHT IJ TEMPORARY HEMODIALYSIS CATHETER MEDICATIONS: NONE ANESTHESIA/SEDATION: NONE FLUOROSCOPY TIME:  Fluoroscopy Time: 0 minutes 6 seconds (3 mGy). COMPLICATIONS: None PROCEDURE: Informed written consent was obtained from the patient's family after a discussion of the risks, benefits, and alternatives to treatment. Questions regarding the procedure were encouraged and answered. The right neck was prepped with chlorhexidine in a sterile fashion, and a sterile drape was applied covering the operative field. Maximum barrier sterile technique with sterile gowns and gloves were used for the procedure.  A timeout was performed prior to the initiation of the procedure. A micropuncture kit was utilized to access the right internal jugular vein under direct, real-time ultrasound guidance after the overlying soft tissues were anesthetized with 1% lidocaine with epinephrine. Ultrasound image documentation was performed. The microwire was kinked to measure appropriate catheter length. A guidewire was advanced to the level of the IVC. A 16 cm hemodialysis catheter was then placed over the wire. Final catheter positioning was confirmed and documented with a spot radiographic image. The catheter aspirates and flushes normally. The catheter was flushed with appropriate volume heparin dwells. Dressings were applied. The patient tolerated the procedure well without immediate post procedural complication. IMPRESSION: Status post right IJ temporary hemodialysis catheter. Catheter ready for use. Signed, Dulcy Fanny. Dellia Nims, RPVI [facility] Radiology Electronically Signed   By: Corrie Mckusick D.O.   On: 10/15/2019 17:30   IR US Guide Vasc Access Right  Result Date: 10/15/2019 INDICATION: 58 year old male referred for placement of hemodialysis catheter EXAM: IMAGE GUIDED PLACEMENT OF RIGHT IJ TEMPORARY HEMODIALYSIS CATHETER MEDICATIONS: NONE ANESTHESIA/SEDATION: NONE FLUOROSCOPY TIME:  Fluoroscopy Time: 0 minutes 6 seconds (3 mGy). COMPLICATIONS: None PROCEDURE: Informed written consent was obtained from the patient's family after a discussion of the risks, benefits, and alternatives to treatment. Questions regarding the procedure were encouraged and answered. The right neck was prepped with chlorhexidine in a sterile fashion, and a sterile drape was applied covering the operative field. Maximum barrier sterile technique with sterile gowns and gloves were used for the procedure. A timeout was performed prior to the initiation of the procedure. A micropuncture kit was utilized to  access the right internal jugular vein under direct, real-time ultrasound guidance after the overlying soft tissues were anesthetized with 1% lidocaine with epinephrine. Ultrasound image documentation was performed. The microwire was kinked to measure appropriate catheter length. A guidewire was advanced to the level of the IVC. A 16 cm hemodialysis catheter was then placed over the wire. Final catheter positioning was confirmed and documented with a spot radiographic image. The catheter aspirates and flushes normally. The catheter was flushed with appropriate volume heparin dwells. Dressings were applied. The patient tolerated the procedure well without immediate post procedural complication. IMPRESSION: Status post right IJ temporary hemodialysis catheter. Catheter ready for use. Signed, Dulcy Fanny. Dellia Nims, RPVI [facility] Radiology Electronically Signed   By: Corrie Mckusick D.O.   On: 10/15/2019 17:30        Scheduled Meds: . sodium chloride   Intravenous Once  . Chlorhexidine Gluconate Cloth  6 each Topical Q0600  . [START ON 10/16/2019] Chlorhexidine Gluconate Cloth  6 each Topical Q0600  . insulin aspart  0-9 Units  Subcutaneous Q4H  . lidocaine      . mupirocin ointment  1 application Nasal BID   Continuous Infusions:   Assessment & Plan:   Acute on chronic renal failure stage IV with metabolic encephalopathy: in the setting of poor oral intake, hypotension, diuretic and ACEI use. Creatinine elevated 6.94 on admission with BUN in 120s., recent baseline creat 3.6-3.4. Recieved-1 L bolus in ED and admitted with IV fluids NS 100cc/hr--changed to bicarb fluids yesterday after discussing with renal at 75 mL/h and concern for oliguria and failing kidneys/rising creatinine to 8 now.  Bladder scan with no evidence of retention.  Serial labs yesterday and this morning show worsening renal function with profound uremia--BUN up to 173 today.  Seen by  nephrology who plan on initiating dialysis-requested attempt catheter placement by IR while awaiting MRCP results. Will keep n.p.o. given worsening mental status and risk of aspiration.  Give IV fluids KVO for tonight  Jaundice/Hyperbilirubinemia due to pancreatic cancer- AST 265, ALT 123, ALP 1068 (ALP 740 in Jan).  Right upper quadrant ultrasound- multiple hypoechoic lesions.  Ordered tumor markers, CA 19-9 and alpha-fetoprotein.  Finally MRCP was done this afternoon with presedation--unfortunately revealing pancreatic head mass with obstructive signs of pancreatic duct/CBD, multiple hepatic mets and bony mets.  GI plans on ERCP and possibly palliative stenting after discussing with sister regarding terminal prognosis.  Dr. Benson Norway has encouraged sister to consider comfort care and he is skeptical about utility/success of stenting given signs of large mass/advanced malignancy on MRCP.  Acute on chronic normocytic anemia: In the setting of advanced malignancy and acute/chronic renal failure.  Patients baseline Hgb appeared to have been 11-12 over the past few years. It was low at 8.2 in Jan 2021 and labs obtained overnight revealed further drop to 5.0-received 2 units of PRBC yesterday and posttransfusion hemoglobin improved to 7.5.  Hemoglobin down to 6.3 again today and has orders for 2 more units..No evidence for melena or hematochezia or hematemesis while here.  No evidence of retroperitoneal bleed on abdominal imaging studies.  Continue supportive care.  Will obtain posttransfusion hemoglobin.   Back pain -acute worsening on chronic-- CT lumbar spine Wo contrast negative for acute fracture or osseous lesions.Multilevel DDD throughout the lumbar spine-most pronounced L3-S1.  MRCP however does mention bony mets and extensive retroperitoneal adenopathy.Heterogeneous left adrenal nodule measuring up to 1.9 cm in size worrisome for potential metastatic disease was mentioned on CT but not reported on  MRI.-Cortisol level within normal limits.  Continue supportive management.  Family considering care goals.  Anion gap metabolic acidosis- in the setting of AKI on CKD.  Received bicarb infusion but now stopped and concern for fluid overload/oliguria.  Now undergoing dialysis.   Elevated troponin- 90 > 102, denies chest pain.  EKG shows LPFB, sinus rhythm.  Likely due to demand ischemia from initial hypotension, and worsening renal insufficiency.  Prolonged QTC-  530- magnesium level at 3 and potassium 4.5, avoid QT prolonging agents.  Repeat EKG ordered yesterday but did not see it scanned in epic-ordered again  DM-random glucose 148.-Holding home Tradjenta for now given poor oral intake, weight loss. Monitor on SSI,  HTN- hypotensive with AKI on presentation requiring IVF-Hold home labetalol, losartan, Lasix 40 mg  q12h, and Norvasc-IV fluids  Schizophrenia/agitation/delirium- patient at baseline apparently has paranoia which has been controlled in the last few years with home meds benztropine, Trileptal, trazodone on hold currently due to elevated LFTs, prolonged QTC and n.p.o. status.  Patient been agitated and  delirious since admission in the setting of significant uremia/jaundice.  We will continue to utilize Ativan as needed as the safest option and sitter/restraints as needed.  Can utilize antipsychotics like Haldol/Zyprexa if family chooses comfort goals.  DVT prophylaxis: SCD  Code Status: Full code Family / Patient Communication: D/W patient's healthcare proxy Ms. Enrique Sack and updated of all the above.  She understands her brother has advanced malignancy as confirmed by MRCP and per her discussions with GI, Dr. Benson Norway.  She also understands he is not a candidate for long-term dialysis per her discussion with Dr. Royce Macadamia.  I have encouraged her to consider CODE STATUS change and comfort goals given advanced malignancy beyond cure at this point.  She would like [place] to continue current  medical care until she discusses with other family members, particularly patient's son, before deciding on comfort goals.  She will get back to me regarding CODE STATUS tonight. Disposition Plan:   Patient is from home prior to hospitalization. Received/Receiving inpatient care for obstructive jaundice/malignancy/acute renal failure and invasive procedures as outlined above Discharge once care goals are established and treatment options maximized.    LOS: 2 days    Time spent: 55 minutes.  Discussed with patient's daughter, discussed with Dr. Benson Norway, discussed with Dr. Royce Macadamia.  Addendum: D/W sister [name] and her husband Remo Lipps again tonight--they would like patient's code status to be changed to DNR at this point. They would like any updates regarding change in medical condition tonight but they plan to meet with palliative care team and myself again tomorrow to discuss further treatment course for Mr Finken. They are considering comfort measures but would like to proceed with ERCP/stenting for palliation if he can tolerate and if benefits outweigh risks. Will f/u GI Dr Benson Norway and family again in am. DNR orders placed.  Guilford Shi, MD [facility] Pager in [place]  If 7PM-7AM, please contact night-coverage [email or web address] 10/15/2019, 6:33 PM

## 2019-10-16 ENCOUNTER — Encounter (HOSPITAL_COMMUNITY): Payer: Self-pay | Admitting: Anesthesiology

## 2019-10-16 ENCOUNTER — Encounter (HOSPITAL_COMMUNITY)
Admission: EM | Disposition: A | Discharge status: Hospice | Payer: Self-pay | Source: Home / Self Care | Attending: Internal Medicine

## 2019-10-16 ENCOUNTER — Inpatient Hospital Stay (HOSPITAL_COMMUNITY): Payer: Medicare HMO

## 2019-10-16 DIAGNOSIS — Z515 Encounter for palliative care: Secondary | ICD-10-CM

## 2019-10-16 DIAGNOSIS — Z7189 Other specified counseling: Secondary | ICD-10-CM

## 2019-10-16 DIAGNOSIS — R9431 Abnormal electrocardiogram [ECG] [EKG]: Secondary | ICD-10-CM

## 2019-10-16 LAB — CBC
HCT: 23.7 % — ABNORMAL LOW (ref 39.0–52.0)
Hemoglobin: 8 g/dL — ABNORMAL LOW (ref 13.0–17.0)
MCH: 29.3 pg (ref 26.0–34.0)
MCHC: 33.8 g/dL (ref 30.0–36.0)
MCV: 86.8 fL (ref 80.0–100.0)
Platelets: 267 10*3/uL (ref 150–400)
RBC: 2.73 MIL/uL — ABNORMAL LOW (ref 4.22–5.81)
RDW: 17.5 % — ABNORMAL HIGH (ref 11.5–15.5)
WBC: 18.2 10*3/uL — ABNORMAL HIGH (ref 4.0–10.5)
nRBC: 1.5 % — ABNORMAL HIGH (ref 0.0–0.2)

## 2019-10-16 LAB — COMPREHENSIVE METABOLIC PANEL
ALT: 77 U/L — ABNORMAL HIGH (ref 0–44)
AST: 135 U/L — ABNORMAL HIGH (ref 15–41)
Albumin: 1.5 g/dL — ABNORMAL LOW (ref 3.5–5.0)
Alkaline Phosphatase: 772 U/L — ABNORMAL HIGH (ref 38–126)
Anion gap: 17 — ABNORMAL HIGH (ref 5–15)
BUN: 136 mg/dL — ABNORMAL HIGH (ref 6–20)
CO2: 21 mmol/L — ABNORMAL LOW (ref 22–32)
Calcium: 8.7 mg/dL — ABNORMAL LOW (ref 8.9–10.3)
Chloride: 105 mmol/L (ref 98–111)
Creatinine, Ser: 6.63 mg/dL — ABNORMAL HIGH (ref 0.61–1.24)
GFR calc Af Amer: 10 mL/min — ABNORMAL LOW (ref >60)
GFR calc non Af Amer: 9 mL/min — ABNORMAL LOW (ref >60)
Glucose, Bld: 126 mg/dL — ABNORMAL HIGH (ref 70–99)
Potassium: 4.6 mmol/L (ref 3.5–5.1)
Sodium: 143 mmol/L (ref 135–145)
Total Bilirubin: 5.7 mg/dL — ABNORMAL HIGH (ref 0.3–1.2)
Total Protein: 6.6 g/dL (ref 6.5–8.1)

## 2019-10-16 LAB — BLOOD GAS, ARTERIAL
Acid-base deficit: 2.6 mmol/L — ABNORMAL HIGH (ref 0.0–2.0)
Bicarbonate: 21.6 mmol/L (ref 20.0–28.0)
Drawn by: 39899
FIO2: 100
O2 Saturation: 99.7 %
Patient temperature: 37.9
pCO2 arterial: 38.5 mmHg (ref 32.0–48.0)
pH, Arterial: 7.373 (ref 7.350–7.450)
pO2, Arterial: 337 mmHg — ABNORMAL HIGH (ref 83.0–108.0)

## 2019-10-16 LAB — GLUCOSE, CAPILLARY
Glucose-Capillary: 110 mg/dL — ABNORMAL HIGH (ref 70–99)
Glucose-Capillary: 118 mg/dL — ABNORMAL HIGH (ref 70–99)
Glucose-Capillary: 122 mg/dL — ABNORMAL HIGH (ref 70–99)
Glucose-Capillary: 124 mg/dL — ABNORMAL HIGH (ref 70–99)
Glucose-Capillary: 130 mg/dL — ABNORMAL HIGH (ref 70–99)
Glucose-Capillary: 134 mg/dL — ABNORMAL HIGH (ref 70–99)
Glucose-Capillary: 82 mg/dL (ref 70–99)

## 2019-10-16 LAB — ECHOCARDIOGRAM COMPLETE
Height: 75 in
Weight: 3449.76 oz

## 2019-10-16 SURGERY — ERCP, WITH INTERVENTION IF INDICATED
Anesthesia: General

## 2019-10-16 MED ORDER — BISACODYL 10 MG RE SUPP: 10.0000 mg | Freq: Every day | RECTAL | Status: DC | PRN

## 2019-10-16 MED ORDER — MORPHINE SULFATE (PF) 2 MG/ML IV SOLN: 1.0000 mg | INTRAVENOUS | Status: DC | PRN

## 2019-10-16 MED ORDER — HEPARIN SODIUM (PORCINE) 1000 UNIT/ML IJ SOLN
INTRAMUSCULAR | Status: AC
  Administered 2019-10-16: 2600 [IU]
  Filled 2019-10-16: qty 3

## 2019-10-16 MED ORDER — SODIUM CHLORIDE 0.9 % IV SOLN: INTRAVENOUS | Status: DC

## 2019-10-16 MED ORDER — LORAZEPAM 2 MG/ML IJ SOLN
1.0000 mg | Freq: Four times a day (QID) | INTRAMUSCULAR | Status: DC | PRN
  Administered 2019-10-16: 2 mg via INTRAVENOUS
  Filled 2019-10-16: qty 1

## 2019-10-16 MED ORDER — IPRATROPIUM-ALBUTEROL 0.5-2.5 (3) MG/3ML IN SOLN: 3.0000 mL | RESPIRATORY_TRACT | Status: DC | PRN

## 2019-10-16 MED ORDER — LORAZEPAM 2 MG/ML IJ SOLN
1.0000 mg | INTRAMUSCULAR | Status: DC | PRN
  Administered 2019-10-17: 2 mg via INTRAVENOUS
  Administered 2019-10-17: 1 mg via INTRAVENOUS
  Administered 2019-10-17 – 2019-10-18 (×2): 2 mg via INTRAVENOUS
  Filled 2019-10-16 (×4): qty 1

## 2019-10-16 MED ORDER — BIOTENE DRY MOUTH MT LIQD: 15.0000 mL | OROMUCOSAL | Status: DC | PRN

## 2019-10-16 MED ORDER — GLYCOPYRROLATE 0.2 MG/ML IJ SOLN
0.4000 mg | INTRAMUSCULAR | Status: DC | PRN
  Filled 2019-10-16: qty 2

## 2019-10-16 MED ORDER — MORPHINE SULFATE (PF) 2 MG/ML IV SOLN
1.0000 mg | INTRAVENOUS | Status: DC | PRN
  Administered 2019-10-17: 1 mg via INTRAVENOUS
  Administered 2019-10-17 – 2019-10-18 (×6): 2 mg via INTRAVENOUS
  Filled 2019-10-16 (×7): qty 1

## 2019-10-16 MED ORDER — DEXTROSE IN LACTATED RINGERS 5 % IV SOLN: INTRAVENOUS | Status: DC

## 2019-10-16 NOTE — Progress Notes (Signed)
Pt has been sleeping for the most part but whenever he's not sleeping, he's restless, tossing and turning. Pt has been medicated for anxiety and it's been effective. Pt has not actually opened his eyes on this shift on his own. Tachypneic at times. Tele sitter still in place. Will continue monitoring pt closely.

## 2019-10-16 NOTE — Plan of Care (Signed)
Contacted by primary team - patient is transitioning to comfort only care after a family meeting.  No additional HD planned.    Will sign off.  Please do not hesitate to contact me if I can be of any assistance with this patient.  Can remove nontunneled dialysis catheter per IV team if this is not needed for IV access for comfort measures.  Claudia Desanctis 10/16/2019 5:19 PM

## 2019-10-16 NOTE — Progress Notes (Signed)
[facility] Progress Note  Name: Lum Stillinger MRN: [id number] DOB: 1961/02/06  Chief Complaint:  Hypotension   Subjective:  Got ativan overnight/early AM for agitation and somnolent on arrival to HD unit this AM.  Able to arouse with deep stimulation.  Family is talking again today with primary team regarding goals.  Spoke with primary team.  HD 125/85 and HR 103.   Review of systems:  Unable to obtain  --------- Background on consult:  Bernhardt Riemenschneider III is an 59 y.o. male is a 60yo man with DM, HTN, Schizophrenia, BPH, cirrhosis and CKD 4 who is seen in consultation for evaluation and management of AKI on CKD.  Pt was admitted yesterday from oncology clinic for hypotension 70/40s.  He presented there for evaluation of liver lesions concerning for malignancy.  He presented outpt on background of 60lb wt loss over 1 year, jaundice and icterus.  RUQ US showed multiple echoic lesions on liver.  He's had poor po intake lately, weakness, inability to stand, back pain, some emesis and diarrhea.   He has a h/o CKD most recently CKD 4 with baseline in mid 3s.  Per notes he's been seen by [facility] in the past but recently declined f/u stating his PCP was following his CKD.  On presentation yesterday his labs showed BUN 128, Cr 6.94.  He was hydrated with I/Os of ~2.5L / 0.2L (all UOP) since admission.  Labs this afternoon showed worsening renal function with BUN 158, Cr 7.52.  He was anemic to Hb 5 on admission and rec'd 2u pRBC with f/u Hb 6.9.  ALP elevated 901 AST 227, ALT 101, Albumin 1.6, T bili 8.2.  Ca 19-9 and AFP are pending.  Renal US showed R 12.3, L 13.3 cm, echogenic.  > 10 cysts in each kidney, no hydronephrosis.  MRCP pending.  CT lumbar spine shows DJD, echogenic lesions in kidneys (c/w cysts on [place]), 1.9cm L adrenal nodule poss metastatic. GI consult is pending this evening.  Pt is sleepy but arousable.  Denies current complaints.  Says food tastes 'so so', endorses dry mouth but  not thirst.  No dyspnea or orthopnea.  His HCPOA, sister, is at bedside tonight.  She is aware of the situation and would like information from all consultants prior to making therapeutic decisions.  She says at baseline her brother lives independently and typically drives but recently lost license she thinks through a paperwork issue.    Intake/Output Summary (Last 24 hours) at 10/16/2019 0854 Last data filed at 10/16/2019 0400 Gross per 24 hour  Intake 733.58 ml  Output 700 ml  Net 33.58 ml    Vitals:  Vitals:   10/15/19 2011 10/15/19 2030 10/16/19 0027 10/16/19 0334  BP: (!) 144/83  (!) 147/86 (!) 144/82  Pulse: (!) 103  (!) 111 (!) 108  Resp: 19 (!) 28 19 19   Temp: (!) 97.5 F (36.4 C)  97.9 F (36.6 C) 100.3 F (37.9 C)  TempSrc: Oral  Oral Oral  SpO2: 93%  94% 91%  Weight:    97.8 kg  Height:         Physical Exam:  General adult male in bed  HEENT normocephalic atraumatic  Neck supple trachea midline Lungs clear to auscultation bilaterally normal work of breathing at rest on room air  Heart S1S2 no rub  Abdomen soft nontender nondistended Extremities no edema  appreciated Neuro - arouses with aggressive stimulation and is s/p ativan per nursing but is nonverbal on  questioning again today Access RIJ nontunneled catheter   Medications reviewed    Labs:  BMP Latest Ref Rng & Units 10/16/2019 10/15/2019 10/14/2019  Glucose 70 - 99 mg/dL 126(H) 134(H) 131(H)  BUN 6 - 20 mg/dL 136(H) 173(H) 158(H)  Creatinine 0.61 - 1.24 mg/dL 6.63(H) 8.01(H) 7.52(H)  BUN/Creat Ratio 9 - 20 - - -  Sodium 135 - 145 mmol/L 143 139 137  Potassium 3.5 - 5.1 mmol/L 4.6 4.6 4.6  Chloride 98 - 111 mmol/L 105 100 101  CO2 22 - 32 mmol/L 21(L) 20(L) 19(L)  Calcium 8.9 - 10.3 mg/dL 8.7(L) 8.9 9.0     Assessment/Plan:   # hepatitis, hyperbilirubinemia, liver lesions:  concern for metastatic malignancy, w/u underway; per primary and GI. MRCP was ordered but not performed.  She requested an  update from GI   # severe oliguric AKI on CKD 4:   - Presumably due to ATN in setting of hypotension on advanced CKD already. Has not improved with IV hydration and improvement in BP.  UA noted - mod blood on dip, not much on microscopy, mild proteinuria c/w prior UAs.  HD started 2/25 - HD today.  Reassess plans for additional HD after family meeting today with primary team   - His sister has elected for short term dialysis at this point to stabilize him/clear uremia while they make further decisions regarding his care based on oncologic prognosis  - He appears quite frail and if has metastatic malignancy that isn't treatable would not be a long-term dialysis candidate.  Nephrology discussed this with his sister who has excellent insight and agrees  # Encephalopathy  - profound uremia contributing  - HD today - Would defer additional ativan unless goals of care change     # Hypotension: resolved normotensive now, cortisol normal.  Suspect was hypovolemic  # Metabolic acidosis: secondary to AKI on CKD  # Normocytic Anemia:  improved with PRBC's. Note concern for malignancy  # renal cystic disease:  ? Etiology, not pertinent to acute discussion.  Does have FH of ESRD but sister doesn't think ADPKD.   Claudia Desanctis, MD 10/16/2019 9:07 AM

## 2019-10-16 NOTE — Progress Notes (Signed)
Patient arrived on HD unit unresponsive to pain, V/S stable, patient placed on NRB because of mental status, MD notified, RRT called but later patient was able to respond to voice and HD was initiated.

## 2019-10-16 NOTE — Consult Note (Signed)
Consultation Note Date: 10/16/2019   Patient Name: Ivan [name]  DOB: 09-29-1960  MRN: [id number]  Age / Sex: 59 y.o., male  PCP: Loman Brooklyn, FNP Referring Physician: Guilford Shi, MD  Reason for Consultation: Establishing goals of care  HPI/Patient Profile:  Per Hospitalist Notes --> 83 y.o. M with h/o DM, HTN, schizophrenia, CKD4, BPD, liver cirrhosis presented to ED from oncology clinic after found tobehypotensive-BP 70/48. Per sister,patient been weak, unable to stand,withback pain,poor p.o. intake, poor urinary output over the past 2 weeks,with a 60 pound weight loss over the past year. Has had one episode of vomiting and loose stools. She had also noticed yellowness of his eyes and skin. Patienthad an outpatient right upper quadrant abdominal ultrasound that showed multiple hypoechoic lesions in the liver worrisome for neoplasm. He has 19.50 pack-year smoking history. He reports current alcohol use of about 2.0 standard drinks of alcohol per week.   Palliative care has been consulted for terminal pancreatic cancer with wide spread metastasis. Appears that he will get a Palliative stent placed for symptomatic relief.   Clinical Assessment and Goals of Care: I have reviewed medical records including EPIC notes, labs and imaging, received report from bedside RN, assessed the patient. Per nursing report patient has not made meaningful vocalizations overnight. He has intermittently been agitated trying to get OOB.     I called Cortavius Montesinos (sister)  to further discuss diagnosis prognosis, GOC, EOL wishes, disposition and options.   I introduced Palliative Medicine as specialized medical care for people living with serious illness. It focuses on providing relief from the symptoms and stress of a serious illness. The goal is to improve quality of life for both the patient and the  family.  We discussed Ivan [name]'s metastatic disease thought to be in the setting of a pancreatic head mass. [name] shared that the other doctors had recommended hospice at this point.   [name] shares that all of this has happened so quickly. Just one week ago she was helping Ivan [name] get to appointments. She said that he lived by himself and was fully independent. She recounts that he had recently switched primary care doctors and they had noticed some abnormalities in his blood results this leading to further workup.   [name] shares that this is all overwhelming. Lillard has a son who is 2 two years old. [name] stated that she wants to update him and her other family members on his poor condition.  A detailed discussion was had today regarding concepts specific to code status. [name] said that she would not wish to put [facility] through cardiopulmonary resuscitation or intubation as we would not be able to change the end result.    The difference between a aggressive medical intervention path  and a palliative comfort care path for this patient at this time was had. [name] said that the medical teams have given her little hope for [name]'s recovery. Allowed her time to express her feelings. Offered therapeutic listening as a form of emotional support.  She  requested a day to gather her family to have a conversation on Ivan [name]'s poor health state. We plan to re-convene tomorrow. In the meanwhile [name] is willing for [place] to place hospice consult for local hospice homes.   Discussed with patient the importance of continued conversation with family and their  medical providers regarding overall plan of care and treatment options, ensuring decisions are within the context of the patients values and GOCs.  Decision Maker: [name] (sister) [phone]  SUMMARY OF RECOMMENDATIONS   TOC --> Hospice home referral  Chaplain Consult  Code Status/Advance Care Planning:  DNR  Symptom  Management:  Per primary team  Palliative Prophylaxis:   Aspiration, Bowel Regimen, Delirium Protocol, Eye Care, Frequent Pain Assessment, Oral Care, Palliative Wound Care and Turn Reposition  Additional Recommendations (Limitations, Scope, Preferences):  Minimize Medications  Psycho-social/Spiritual:   Desire for further Chaplaincy support: Yes  Additional Recommendations: Education on Hospice  Prognosis:   Hours - Days  Discharge Planning: Hospice facility     Primary Diagnoses: Present on Admission: . Hepatic cirrhosis ([place]) . Lesion of liver . CKD (chronic kidney disease) stage 4, GFR 15-29 ml/min (HCC) . Elevated liver enzymes . Paranoid schizophrenia ([place]) . Essential hypertension . Liver lesion  I have reviewed the medical record, interviewed the patient and family, and examined the patient. The following aspects are pertinent.  Past Medical History:  Diagnosis Date  . Bipolar 1 disorder ([place])   . Diabetes mellitus   . Edema of both legs 04/07/2013  . Gout 09/15/2019  . Hypertension   . Hypokalemia 04/02/2013  . Murmur    Echo done 2018  . Paranoid schizophrenia ([place])   . Schizoaffective disorder [facility])    Social History   Socioeconomic History  . Marital status: Divorced    Spouse name: Not on file  . Number of children: Not on file  . Years of education: Not on file  . Highest education level: Not on file  Occupational History  . Not on file  Tobacco Use  . Smoking status: Current Every Day Smoker    Packs/day: 0.50    Years: 39.00    Pack years: 19.50    Types: Cigarettes, Cigars  . Smokeless tobacco: Current User    Types: Chew  . Tobacco comment: chewing tobacco in the summertime.  Substance and Sexual Activity  . Alcohol use: Yes    Alcohol/week: 2.0 standard drinks    Types: 1 Glasses of wine, 1 Cans of beer per week    Comment: occasional/social alcohol. No history of heavy drinking  . Drug use: No  . Sexual activity: Never  Other  Topics Concern  . Not on file  Social History Narrative  . Not on file   Social Determinants of Health   Financial Resource Strain:   . Difficulty of Paying Living Expenses: Not on file  Food Insecurity:   . Worried About Charity fundraiser in the Last Year: Not on file  . Ran Out of Food in the Last Year: Not on file  Transportation Needs:   . Lack of Transportation (Medical): Not on file  . Lack of Transportation (Non-Medical): Not on file  Physical Activity:   . Days of Exercise per Week: Not on file  . Minutes of Exercise per Session: Not on file  Stress:   . Feeling of Stress : Not on file  Social Connections:   . Frequency of Communication with Friends and Family: Not on file  . Frequency  of Social Gatherings with Friends and Family: Not on file  . Attends Religious Services: Not on file  . Active Member of Clubs or Organizations: Not on file  . Attends Archivist Meetings: Not on file  . Marital Status: Not on file   Family History  Problem Relation Age of Onset  . Diabetes Mother   . Heart disease Mother   . Kidney disease Mother   . Heart disease Father   . Lung cancer Father   . Diabetes Son   . Diabetes Maternal Grandmother   . Heart disease Paternal Grandmother   . Heart disease Paternal Grandfather   . Breast cancer Paternal Aunt   . Prostate cancer Paternal Uncle   . Depression Neg Hx   . Colon cancer Neg Hx   . Liver disease Neg Hx    Scheduled Meds: . sodium chloride   Intravenous Once  . Chlorhexidine Gluconate Cloth  6 each Topical Q0600  . Chlorhexidine Gluconate Cloth  6 each Topical Q0600  . insulin aspart  0-9 Units Subcutaneous Q4H  . mupirocin ointment  1 application Nasal BID   Continuous Infusions: . sodium chloride 20 mL/hr at 10/16/19 0400   PRN Meds:.acetaminophen **OR** acetaminophen, lidocaine (PF), ondansetron **OR** ondansetron (ZOFRAN) IV, oxyCODONE-acetaminophen, polyethylene glycol Medications Prior to Admission:   Prior to Admission medications   Medication Sig Start Date End Date Taking? Authorizing Provider  allopurinol (ZYLOPRIM) 100 MG tablet TAKE (1) TABLET BY MOUTH ONCE DAILY. Patient taking differently: Take 100 mg by mouth daily.  09/25/19  Yes Hendricks Limes F, FNP  amLODipine (NORVASC) 10 MG tablet Take 1 tablet by mouth daily.   Yes [provider]  aspirin EC 81 MG EC tablet Take 1 tablet (81 mg total) by mouth daily. 12/03/14  Yes Kerrie Buffalo, NP  atorvastatin (LIPITOR) 20 MG tablet Take 1 tablet by mouth daily.   Yes [provider]  benztropine (COGENTIN) 0.5 MG tablet Take 1 tablet (0.5 mg total) by mouth daily. For durg induced extrapyramidal reaction Patient taking differently: Take 0.5 mg by mouth 2 (two) times daily. For durg induced extrapyramidal reaction 01/14/15  Yes Rankin, Shuvon B, NP  cloNIDine (CATAPRES) 0.1 MG tablet Take 1 tablet (0.1 mg total) by mouth 2 (two) times daily. For high blood pressure 01/14/15  Yes Rankin, Shuvon B, NP  cyclobenzaprine (FLEXERIL) 5 MG tablet Take 1 tablet (5 mg total) by mouth 3 (three) times daily as needed for muscle spasms. 10/09/19  Yes Hendricks Limes F, FNP  ferrous sulfate 325 (65 FE) MG tablet Take 1 tablet (325 mg total) by mouth 2 (two) times daily. Patient taking differently: Take 325 mg by mouth 2 (two) times daily with a meal.  09/16/19  Yes Loman Brooklyn, FNP  furosemide (LASIX) 40 MG tablet Take 1 tablet (40 mg total) by mouth 2 (two) times daily. Patient taking differently: Take 40 mg by mouth daily.  01/14/15  Yes Rankin, Shuvon B, NP  haloperidol decanoate (HALDOL DECANOATE) 100 MG/ML injection Inject 1 mL into the muscle every 30 (thirty) days. 10/13/19  Yes [provider]  labetalol (NORMODYNE) 200 MG tablet Take 1 tablet by mouth 2 (two) times daily. 07/06/14  Yes [provider]  losartan (COZAAR) 25 MG tablet Take 1 tablet by mouth daily. 03/05/18  Yes [provider]  magnesium  oxide (MAG-OX) 400 MG tablet Take 1 tablet by mouth 2 (two) times daily. 08/19/19  Yes [provider]  OXcarbazepine (TRILEPTAL)  150 MG tablet Take 1 tablet (150 mg total) by mouth 2 (two) times daily. For Mood stabilization 01/14/15  Yes Rankin, Shuvon B, NP  potassium chloride SA (KLOR-CON) 20 MEQ tablet Take 1 tablet (20 mEq total) by mouth daily. 09/16/19  Yes Hendricks Limes F, FNP  TRADJENTA 5 MG TABS tablet TAKE 1 TABLET BY MOUTH ONCE DAILY FOR DIABETES. Patient taking differently: Take 5 mg by mouth daily.  09/25/19  Yes Hendricks Limes F, FNP  traZODone (DESYREL) 150 MG tablet Take 150 mg by mouth at bedtime.   Yes [provider]   Allergies  Allergen Reactions  . Bee Venom Swelling   Review of Systems  Unable to perform ROS  Physical Exam Vitals and nursing note reviewed.  Constitutional:      Comments: Somnolent, withdrawn  HENT:     Head: Normocephalic.     Nose: Nose normal.  Eyes:     Pupils: Pupils are equal, round, and reactive to light.  Cardiovascular:     Rate and Rhythm: Tachycardia present.     Pulses: Normal pulses.  Pulmonary:     Effort: Pulmonary effort is normal.  Abdominal:     General: Bowel sounds are normal.  Musculoskeletal:     Cervical back: Normal range of motion.  Skin:    General: Skin is dry.     Capillary Refill: Capillary refill takes less than 2 seconds.  Neurological:     Mental Status: He is disoriented.    Vital Signs: BP (!) 144/82 (BP Location: Right Arm)   Pulse (!) 108   Temp 100.3 F (37.9 C) (Oral)   Resp 19   Ht 6\' 3"  (1.905 m)   Wt 97.8 kg   SpO2 91%   BMI 26.95 kg/m  Pain Scale: PAINAD   Pain Score: Asleep  SpO2: SpO2: 91 % O2 Device:SpO2: 91 % O2 Flow Rate: .   IO: Intake/output summary:   Intake/Output Summary (Last 24 hours) at 10/16/2019 7939 Last data filed at 10/16/2019 0400 Gross per 24 hour  Intake 733.58 ml  Output 700 ml  Net 33.58 ml   LBM: Last BM Date: 10/15/19 Baseline  Weight: Weight: 111.6 kg Most recent weight: Weight: 97.8 kg     Palliative Assessment/Data: 10%  Time In: 1200 Time Out: 1310 Time Total: 70 Greater than 50%  of this time was spent counseling and coordinating care related to the above assessment and plan.  Signed by: Rosezella Rumpf, NP   Please contact Palliative Medicine Team phone at [phone] for questions and concerns.  For individual provider: See Shea Evans

## 2019-10-16 NOTE — Progress Notes (Signed)
PROGRESS NOTE    Shafer Swamy III  VFI:[id number]  DOB: 11-29-1960  PCP: Loman Brooklyn, FNP Admit date:10/13/2019  59 y.o. M with h/o  DM, HTN, schizophrenia, CKD4, BPD, liver cirrhosis presented to ED from oncology clinic after found to be hypotensive-BP 70/48.  Per sister, patient been weak, unable to stand, with back pain, poor p.o. intake, poor urinary output over the past 2 weeks, with a 60 pound weight loss over the past year.  Has had one episode of vomiting and loose stools.  She had also noticed yellowness of his eyes and skin. Patient had an outpatient right upper quadrant abdominal ultrasound that showed multiple hypoechoic lesions in the liver worrisome for neoplasm. He has 19.50 pack-year smoking history. He reports current alcohol use of about 2.0 standard drinks of alcohol per week.  ED Course: Afebrile, BP 84/55-113/74, 99% RA. Labs showed elevated liver enzymes AST 265, ALT 123, ALP 1068.  Total bilirubin 8.2. Creatinine elevated at 6.94.  EKG showed sinus rhythm. Left posterior fascicular block and prolonged Qtc at 530 ms.  1L Normal saline bolus given.  Hospital course: Patient admitted to [facility] for for further evaluation and management.  Patient seen by nephrology for worsening renal function with uremic encephalopathy.  Temporary dialysis catheter was placed and dialysis initiated while pursuing jaundice work-up.  Patient was seen by GI and MRCP ordered which showed advanced pancreatic malignancy with hepatic/bony mets.  After multiple discussions with family members, patient has now been transitioned to DNR/comfort care only.  His condition is terminal.  Sister, [name] who is POA has mostly been driving healthcare decisions.  Patient's son arriving in town today and palliative care met with family (please see their note for details) Subjective: Patient lethargic today.  DNR now after discussion with family members yesterday.  Plan for palliative ERCP/stent for obstructive jaundice  but deemed too ill for anesthesia. Family understands grave prognosis and waiting for patient's son to arrive in town.   Objective: Vitals:   10/16/19 1030 10/16/19 1100 10/16/19 1127 10/16/19 1210  BP: 100/69 106/72 (!) 124/106 127/77  Pulse: (!) 106 (!) 108 (!) 106 (!) 104  Resp:   (!) 30 (!) 34  Temp:   100 F (37.8 C) 99.8 F (37.7 C)  TempSrc:   Oral Oral  SpO2:   98% 95%  Weight:   97.5 kg   Height:        Intake/Output Summary (Last 24 hours) at 10/16/2019 1718 Last data filed at 10/16/2019 1200 Gross per 24 hour  Intake 578.64 ml  Output 1078 ml  Net -499.36 ml   Filed Weights   10/16/19 0334 10/16/19 0823 10/16/19 1127  Weight: 97.8 kg 97.8 kg 97.5 kg    Physical Examination:  General exam: Appears calm and comfortable, icteric Respiratory system: Clear to auscultation. Respiratory effort normal. Cardiovascular system: S1 & S2 heard, RRR. No JVD, murmurs, rubs, gallops or clicks. No pedal edema. Gastrointestinal system: Abdomen is nondistended, soft and nontender. Normal BS Central nervous system: Drowsy/somnolent from sedatives.  No new focal neurological deficits. Extremities: No contractures, edema or joint deformities.  Skin: + jaundice, no rashes, lesions or ulcers Psychiatry: Judgement and insight impaired.  Data Reviewed: I have personally reviewed following labs and imaging studies  CBC: Recent Labs  Lab 10/14/19 0145 10/14/19 0145 10/14/19 1432 10/14/19 1851 10/15/19 0502 10/15/19 1913 10/16/19 0447  WBC 14.5*  --  16.4*  --  17.0*  --  18.2*  HGB 5.0*   < >  6.9* 7.5* 6.3* 8.3* 8.0*  HCT 16.2*   < > 21.6* 23.6* 19.4* 25.4* 23.7*  MCV 89.0  --  88.9  --  89.0  --  86.8  PLT 383  --  370  --  318  --  267   < > = values in this interval not displayed.   Basic Metabolic Panel: Recent Labs  Lab 10/13/19 1543 10/13/19 2150 10/14/19 1432 10/15/19 0502 10/16/19 0447  NA 133*  --  137 139 143  K 4.5  --  4.6 4.6 4.6  CL 97*  --  101 100 105    CO2 18*  --  19* 20* 21*  GLUCOSE 148*  --  131* 134* 126*  BUN 128*  --  158* 173* 136*  CREATININE 6.94*  --  7.52* 8.01* 6.63*  CALCIUM 9.4  --  9.0 8.9 8.7*  MG  --  3.0*  --   --   --    GFR: Estimated Creatinine Clearance: 14.9 mL/min (A) (by C-G formula based on SCr of 6.63 mg/dL (H)). Liver Function Tests: Recent Labs  Lab 10/13/19 1543 10/14/19 1432 10/15/19 0502 10/16/19 0447  AST 265* 227* 156* 135*  ALT 123* 101* 83* 77*  ALKPHOS 1,068* 901* 768* 772*  BILITOT 8.2* 6.9* 5.8* 5.7*  PROT 7.8 6.8 6.3* 6.6  ALBUMIN 2.2* 1.6* 1.5* 1.5*   Recent Labs  Lab 10/13/19 1543  LIPASE 91*   Recent Labs  Lab 10/13/19 1543  AMMONIA 36*   Coagulation Profile: Recent Labs  Lab 10/13/19 2150 10/15/19 0920  INR 2.7* 3.0*   Cardiac Enzymes: No results for input(s): CKTOTAL, CKMB, CKMBINDEX, TROPONINI in the last 168 hours. BNP (last 3 results) No results for input(s): PROBNP in the last 8760 hours. HbA1C: Recent Labs    10/13/19 2150  HGBA1C 5.8*   CBG: Recent Labs  Lab 10/16/19 0410 10/16/19 0732 10/16/19 0830 10/16/19 1220 10/16/19 1623  GLUCAP 134* 130* 118* 82 110*   Lipid Profile: No results for input(s): CHOL, HDL, LDLCALC, TRIG, CHOLHDL, LDLDIRECT in the last 72 hours. Thyroid Function Tests: No results for input(s): TSH, T4TOTAL, FREET4, T3FREE, THYROIDAB in the last 72 hours. Anemia Panel: Recent Labs    10/14/19 1432  FERRITIN 4,420*  TIBC 210*  IRON 49   Sepsis Labs: No results for input(s): PROCALCITON, LATICACIDVEN in the last 168 hours.  Recent Results (from the past 240 hour(s))  SARS CORONAVIRUS 2 (TAT 6-24 HRS) Nasopharyngeal Nasopharyngeal Swab     Status: None   Collection Time: 10/13/19  5:41 PM   Specimen: Nasopharyngeal Swab  Result Value Ref Range Status   SARS Coronavirus 2 NEGATIVE NEGATIVE Final    Comment: (NOTE) SARS-CoV-2 target nucleic acids are NOT DETECTED. The SARS-CoV-2 RNA is generally detectable in upper  and lower respiratory specimens during the acute phase of infection. Negative results do not preclude SARS-CoV-2 infection, do not rule out co-infections with other pathogens, and should not be used as the sole basis for treatment or other patient management decisions. Negative results must be combined with clinical observations, patient history, and epidemiological information. The expected result is Negative. Fact Sheet for Patients: SugarRoll.be Fact Sheet for Healthcare Providers: https://www.woods-mathews.com/ This test is not yet approved or cleared by the [place] FDA and  has been authorized for detection and/or diagnosis of SARS-CoV-2 by FDA under an Emergency Use Authorization (EUA). This EUA will remain  in effect (meaning this test can be used) for the duration of  the COVID-19 declaration under Section 56 4(b)(1) of the Act, 21 U.S.C. section 360bbb-3(b)(1), unless the authorization is terminated or revoked sooner. Performed at [facility], [place] [address] [place], [place] [address]   MRSA PCR Screening     Status: Abnormal   Collection Time: 10/14/19  3:43 AM   Specimen: Nasopharyngeal  Result Value Ref Range Status   MRSA by PCR POSITIVE (A) NEGATIVE Final    Comment:        The GeneXpert MRSA Assay (FDA approved for NASAL specimens only), is one component of a comprehensive MRSA colonization surveillance program. It is not intended to diagnose MRSA infection nor to guide or monitor treatment for MRSA infections. RESULT CALLED TO, READ BACK BY AND VERIFIED WITH: CELSO RN 10/14/19 0513 JDW Performed at [facility] [address] [place], [place] [address]       Radiology Studies: MR ABDOMEN MRCP WO CONTRAST  Result Date: 10/15/2019 CLINICAL DATA:  Evaluate multiple liver lesions identified EXAM: MRI ABDOMEN WITHOUT CONTRAST  (INCLUDING MRCP) TECHNIQUE: Multiplanar multisequence MR imaging of the  abdomen was performed. Heavily T2-weighted images of the biliary and pancreatic ducts were obtained, and three-dimensional MRCP images were rendered by post processing. COMPARISON:  Ultrasound limited 09/21/2018 FINDINGS: Markedly diminished exam detail due to motion artifact. According to the sonographer the patient is confused, disoriented and is unable to remain still despite being sedated twice. Lower chest: No acute abnormality. Hepatobiliary: Extensive, multifocal liver lesions are identified throughout both lobes. Dominant confluent mass within the right hepatic lobe measures at least 11.3 cm, image 26/14. Findings are highly concerning for diffuse liver metastases. No gallstones are identified. There is increase caliber of the common bile duct which measures up to 1.8 cm. Abrupt cut off of the CBD at the level of the pancreas is noted, image 31/8. Pancreas: The main pancreatic duct appears dilated. A large soft tissue mass in the region of the head of pancreas is identified, indeterminate and incompletely characterized due to motion artifact and lack of IV contrast material. This measures approximately 6.6 x 6.6 cm, image 84/1200. Spleen:  Within normal limits in size and appearance. Adrenals/Urinary Tract: Adrenal gland suboptimally visualized. Innumerable bilateral kidney cysts of varying complexity are identified. These are all incompletely characterized due to motion artifact and lack of IV contrast material. No hydronephrosis noted. Stomach/Bowel: The stomach appears distended. The bowel loops are suboptimally evaluated due to motion. Vascular/Lymphatic:  Normal caliber abdominal aorta. Extensive retroperitoneal adenopathy is noted. Index left periaortic node measures 2.2 cm short axis, image 100/12. Index retrocaval node measures 1.7 cm, image 95/12. Index porta hepatic lymph node has a short axis of 3.8 cm. Other:  Small volume of perihepatic free fluid noted. Musculoskeletal: Areas of increased T2  signal noted throughout the visualized osseous structures concerning for diffuse osseous metastasis. IMPRESSION: 1. Markedly diminished exam detail due to motion artifact and lack of IV contrast material. Repeat cross-sectional imaging of the abdomen following resolution of the patient's current clinical issues is strongly recommended. The patient should be above the remain motionless and perform breath hold in ordered to achieve diagnostic MR imaging of the abdomen. Alternatively, a CT may be considered as this is less susceptible to motion artifact. 2. Exam compare most presence of extensive liver lesions involving both lobes, too numerous to count. These are suspicious for metastatic disease. 3. Retroperitoneal adenopathy. 4. There is dilation of the pancreatic duct and common bile duct. A mass is suspected in the  region of the head of pancreas which may represent a primary pancreatic neoplasm or peripancreatic/porta hepatic adenopathy. These cannot be distinguished due to factors described above. 5. Suspect multifocal bone metastasis. Electronically Signed   By: Kerby Moors M.D.   On: 10/15/2019 12:51   IR Fluoro Guide CV Line Right  Result Date: 10/15/2019 INDICATION: 59 year old male referred for placement of hemodialysis catheter EXAM: IMAGE GUIDED PLACEMENT OF RIGHT IJ TEMPORARY HEMODIALYSIS CATHETER MEDICATIONS: NONE ANESTHESIA/SEDATION: NONE FLUOROSCOPY TIME:  Fluoroscopy Time: 0 minutes 6 seconds (3 mGy). COMPLICATIONS: None PROCEDURE: Informed written consent was obtained from the patient's family after a discussion of the risks, benefits, and alternatives to treatment. Questions regarding the procedure were encouraged and answered. The right neck was prepped with chlorhexidine in a sterile fashion, and a sterile drape was applied covering the operative field. Maximum barrier sterile technique with sterile gowns and gloves were used for the procedure. A timeout was performed prior to the  initiation of the procedure. A micropuncture kit was utilized to access the right internal jugular vein under direct, real-time ultrasound guidance after the overlying soft tissues were anesthetized with 1% lidocaine with epinephrine. Ultrasound image documentation was performed. The microwire was kinked to measure appropriate catheter length. A guidewire was advanced to the level of the IVC. A 16 cm hemodialysis catheter was then placed over the wire. Final catheter positioning was confirmed and documented with a spot radiographic image. The catheter aspirates and flushes normally. The catheter was flushed with appropriate volume heparin dwells. Dressings were applied. The patient tolerated the procedure well without immediate post procedural complication. IMPRESSION: Status post right IJ temporary hemodialysis catheter. Catheter ready for use. Signed, Dulcy Fanny. Dellia Nims, RPVI [facility] [place] [facility] Electronically Signed   By: Corrie Mckusick D.O.   On: 10/15/2019 17:30   IR US Guide Vasc Access Right  Result Date: 10/15/2019 INDICATION: 60 year old male referred for placement of hemodialysis catheter EXAM: IMAGE GUIDED PLACEMENT OF RIGHT IJ TEMPORARY HEMODIALYSIS CATHETER MEDICATIONS: NONE ANESTHESIA/SEDATION: NONE FLUOROSCOPY TIME:  Fluoroscopy Time: 0 minutes 6 seconds (3 mGy). COMPLICATIONS: None PROCEDURE: Informed written consent was obtained from the patient's family after a discussion of the risks, benefits, and alternatives to treatment. Questions regarding the procedure were encouraged and answered. The right neck was prepped with chlorhexidine in a sterile fashion, and a sterile drape was applied covering the operative field. Maximum barrier sterile technique with sterile gowns and gloves were used for the procedure. A timeout was performed prior to the initiation of the procedure. A micropuncture kit was utilized to access the right internal jugular  vein under direct, real-time ultrasound guidance after the overlying soft tissues were anesthetized with 1% lidocaine with epinephrine. Ultrasound image documentation was performed. The microwire was kinked to measure appropriate catheter length. A guidewire was advanced to the level of the IVC. A 16 cm hemodialysis catheter was then placed over the wire. Final catheter positioning was confirmed and documented with a spot radiographic image. The catheter aspirates and flushes normally. The catheter was flushed with appropriate volume heparin dwells. Dressings were applied. The patient tolerated the procedure well without immediate post procedural complication. IMPRESSION: Status post right IJ temporary hemodialysis catheter. Catheter ready for use. Signed, Dulcy Fanny. Dellia Nims, RPVI [facility] Electronically Signed   By: Corrie Mckusick D.O.   On: 10/15/2019 17:30   ECHOCARDIOGRAM COMPLETE  Result Date: 10/16/2019    ECHOCARDIOGRAM REPORT   Patient Name:  Lorelee Cover III Date of Exam: 10/16/2019 Medical Rec #:  [id number]        Height:       75.0 in Accession #:    [id number]       Weight:       215.6 lb Date of Birth:  07/07/1961        BSA:          2.266 m Patient Age:    89 years         BP:           127/77 mmHg Patient Gender: M                HR:           106 bpm. Exam Location:  Inpatient Procedure: 2D Echo Indications:    Abnormal ECG 794.31 / R94.31  History:        Patient has no prior history of Echocardiogram examinations.                 Signs/Symptoms:Hypotension; Risk Factors:Diabetes. End stage                 renal disease, hepatits, Encephalopathy, Normocytic Anemia.  Sonographer:    Darlina Sicilian RDCS Referring Phys: [id number] [place]  1. Left ventricular ejection fraction, by estimation, is 60 to 65%. The left ventricle has normal function. The left ventricle has no regional wall motion abnormalities. There is  moderate left ventricular hypertrophy. Left ventricular diastolic parameters were normal.  2. Right ventricular systolic function is normal. The right ventricular size is normal.  3. The mitral valve is normal in structure and function. No evidence of mitral valve regurgitation. No evidence of mitral stenosis.  4. The aortic valve is tricuspid. Aortic valve regurgitation is not visualized. Mild aortic valve sclerosis is present, with no evidence of aortic valve stenosis.  5. The inferior vena cava is normal in size with greater than 50% respiratory variability, suggesting right atrial pressure of 3 mmHg. FINDINGS  Left Ventricle: Left ventricular ejection fraction, by estimation, is 60 to 65%. The left ventricle has normal function. The left ventricle has no regional wall motion abnormalities. The left ventricular internal cavity size was normal in size. There is  moderate left ventricular hypertrophy. Left ventricular diastolic parameters were normal. Right Ventricle: The right ventricular size is normal. No increase in right ventricular wall thickness. Right ventricular systolic function is normal. Left Atrium: Left atrial size was normal in size. Right Atrium: Right atrial size was normal in size. Pericardium: There is no evidence of pericardial effusion. Mitral Valve: The mitral valve is normal in structure and function. Normal mobility of the mitral valve leaflets. No evidence of mitral valve regurgitation. No evidence of mitral valve stenosis. Tricuspid Valve: The tricuspid valve is normal in structure. Tricuspid valve regurgitation is trivial. No evidence of tricuspid stenosis. Aortic Valve: The aortic valve is tricuspid. Aortic valve regurgitation is not visualized. Mild aortic valve sclerosis is present, with no evidence of aortic valve stenosis. Pulmonic Valve: The pulmonic valve was normal in structure. Pulmonic valve regurgitation is not visualized. No evidence of pulmonic stenosis. Aorta: The aortic  root is normal in size and structure. Venous: The inferior vena cava is normal in size with greater than 50% respiratory variability, suggesting right atrial pressure of 3 mmHg. IAS/Shunts: No atrial level shunt detected by color flow Doppler.  LEFT VENTRICLE PLAX 2D LVIDd:         5.01 cm  Diastology LVIDs:         2.81 cm  LV e' lateral:   5.87 cm/s LV PW:         1.19 cm  LV E/e' lateral: 12.0 LV IVS:        1.46 cm  LV e' medial:    5.11 cm/s LVOT diam:     2.30 cm  LV E/e' medial:  13.8 LV SV:         58 LV SV Index:   26 LVOT Area:     4.15 cm  RIGHT VENTRICLE RV S prime:     11.50 cm/s TAPSE (M-mode): 1.6 cm LEFT ATRIUM             Index       RIGHT ATRIUM           Index LA diam:        3.30 cm 1.46 cm/m  RA Area:     18.60 cm LA Vol (A2C):   65.3 ml 28.82 ml/m RA Volume:   48.20 ml  21.27 ml/m LA Vol (A4C):   44.1 ml 19.46 ml/m LA Biplane Vol: 56.6 ml 24.98 ml/m  AORTIC VALVE LVOT Vmax:   102.00 cm/s LVOT Vmean:  78.200 cm/s LVOT VTI:    0.140 m  AORTA Ao Root diam: 3.30 cm Ao Asc diam:  3.40 cm MITRAL VALVE MV Area (PHT): 5.38 cm    SHUNTS MV Decel Time: 141 msec    Systemic VTI:  0.14 m MV E velocity: 70.30 cm/s  Systemic Diam: 2.30 cm MV A velocity: 46.30 cm/s MV E/A ratio:  1.52 Jenkins Rouge MD Electronically signed by Jenkins Rouge MD Signature Date/Time: 10/16/2019/3:08:39 PM    Final         Scheduled Meds: . sodium chloride   Intravenous Once  . Chlorhexidine Gluconate Cloth  6 each Topical Q0600  . Chlorhexidine Gluconate Cloth  6 each Topical Q0600  . insulin aspart  0-9 Units Subcutaneous Q4H  . mupirocin ointment  1 application Nasal BID   Continuous Infusions: . dextrose 5% lactated ringers 30 mL/hr at 10/16/19 1620    Assessment & Plan:   Acute on chronic renal failure stage IV with metabolic encephalopathy: in the setting of poor oral intake, hypotension, diuretic and ACEI use. Creatinine elevated 6.94 on admission with BUN in 120s., recent baseline creat 3.6-3.4.  Recieved-1 L bolus in ED and admitted with IV fluids NS 100cc/hr--changed to bicarb fluids yesterday after discussing with renal at 75 mL/h and concern for oliguria and failing kidneys/rising creatinine to 8 now.  Bladder scan with no evidence of retention.  Serial labs yesterday and this morning show worsening renal function with profound uremia--BUN up to 173 today.  Seen by nephrology and initiated on dialysis-temp catheter placed on 2/25 and received 1 session of dialysis.  Next treatment would be tomorrow but patient declining rapidly.  After palliative care team and me discussing with POA (sister), transition to comfort care only today.  Was hypoglycemic earlier and on KVO dextrose fluids until son arrives.  Jaundice/Hyperbilirubinemia due to pancreatic cancer- AST 265, ALT 123, ALP 1068 (ALP 740 in Jan).  Right upper quadrant ultrasound- multiple hypoechoic lesions.  Ordered tumor markers, CA 19-9 and alpha-fetoprotein.   MRCP--unfortunately revealing pancreatic head mass with obstructive signs of pancreatic duct/CBD, multiple hepatic mets and bony mets.  GI deferred ERCP and palliative stenting as deemed too sick for anesthesia.  Comfort care only now.  Acute on chronic  normocytic anemia: In the setting of advanced malignancy and acute/chronic renal failure.  Patients baseline Hgb appeared to have been 11-12 over the past few years. It was low at 8.2 in Jan 2021 and labs obtained overnight revealed further drop to 5.0-received 2 units of PRBC 2/24 and  2 more units on 2/25.Marland KitchenNo evidence for melena or hematochezia or hematemesis while here.  No evidence of retroperitoneal bleed on abdominal imaging studies.  Continue supportive care.  No further labs.  Back pain -acute worsening on chronic-- CT lumbar spine Wo contrast negative for acute fracture or osseous lesions.Multilevel DDD throughout the lumbar spine-most pronounced L3-S1.  MRCP however does mention bony mets and extensive retroperitoneal  adenopathy. Continue supportive management.    Anion gap metabolic acidosis- in the setting of AKI on CKD.  Received bicarb infusion but now stopped and concern for fluid overload/oliguria requiring hemodialysis  Prolonged QTC- at 530 MS on admission and on repeat EKG- magnesium level at 3 and potassium 4.5, avoided QT prolonging agents so far, now comfort care.  DM-on SSI, hypoglycemic this morning and on dextrose fluids KVO until son visits.  Schizophrenia/agitation/delirium- patient at baseline apparently has paranoia which has been controlled in the last few years with home meds benztropine, Trileptal, trazodone on hold currently due to elevated LFTs, prolonged QTC and n.p.o. status.  Patient been agitated and delirious since admission in the setting of significant uremia/jaundice.  We will continue to utilize Ativan as needed.   DVT prophylaxis: SCD  Code Status: Full code Family / Patient Communication: D/W patient's healthcare proxy Ms. Enrique Sack and updated of all the above.  She understands her brother has advanced malignancy as confirmed by MRCP and per her discussions with GI, Dr. Hung/nephrology Dr. Royce Macadamia and with palliative care team today.  Patient's son now in town and [name] is driving him to the hospital--expected to be here in another 30 minutes.  She would like to transition him to comfort care only and would like permission for multiple family members to visit him.  Requested bedside nurse to facilitate the same. Disposition Plan: Per discussion with palliative care, family chose residential hospice--he has terminal prognosis and may pass in the hospital.    LOS: 3 days    Time spent: 35 minutes [place]ford Shi, MD [facility] Pager in [place]  If 7PM-7AM, please contact night-coverage [email or web address] 10/16/2019, 5:18 PM

## 2019-10-16 NOTE — Progress Notes (Signed)
  Echocardiogram 2D Echocardiogram has been performed.  Darlina Sicilian M 10/16/2019, 1:05 PM

## 2019-10-16 NOTE — Significant Event (Addendum)
Rapid Response Event Note  Overview: Time Called: 0833 Arrival Time: 6415 Event Type: Neurologic  Initial Focused Assessment: Per staff patient arrived in HD "unresponsive to pain VSS" Placed on NRB bc of Mental status. Upon my arrival patient is responsive to deep pain stimulus, withdrawals all extremities.   BP 125/78 ST 104 RR 35  O2 sat 100% CBG 170   Lung sounds clear, heart tones regular  Dr Jonnie Finner at bedside  Interventions: ABG done 7.37/38/337/21 NRB removed:  Placed on RA  O2 sats 96%  BP 125/83  ST 106  RR 36  O2 sat 96% on RA  Patient is spontaneously moving and coughing but not following commands and not opening his eyes, occasionally moaning otherwise non verbal.     Plan of Care (if not transferred): Per MD: palliative care conversation with family today.    Event Summary: Name of Physician Notified: Guilford Shi at [phone]  Name of Consulting Physician Notified: Dr Jonnie Finner at bedside at       Event End Time: 0920  Ivan [name]

## 2019-10-16 NOTE — Anesthesia Preprocedure Evaluation (Deleted)
Anesthesia Evaluation    Reviewed: Allergy & Precautions, Patient's Chart, lab work & pertinent test results  Airway        Dental   Pulmonary Current Smoker,           Cardiovascular hypertension,      Neuro/Psych    GI/Hepatic   Endo/Other  diabetes  Renal/GU      Musculoskeletal   Abdominal   Peds  Hematology   Anesthesia Other Findings   Reproductive/Obstetrics                             Anesthesia Physical Anesthesia Plan Anesthesia Quick Evaluation

## 2019-10-16 NOTE — Progress Notes (Signed)
The patient's ERCP was cancelled.  Anesthesia deems him too ill to under take the procedure and I agree.  I spoke again to his sister Nuno Brubacher and explained/updated the situation.  She understands the current severity of his brother's illness and is willing to pursue Hospice care.

## 2019-10-17 DIAGNOSIS — Z66 Do not resuscitate: Secondary | ICD-10-CM

## 2019-10-17 LAB — GLUCOSE, CAPILLARY: Glucose-Capillary: 109 mg/dL — ABNORMAL HIGH (ref 70–99)

## 2019-10-17 LAB — SARS CORONAVIRUS 2 (TAT 6-24 HRS): SARS Coronavirus 2: NEGATIVE

## 2019-10-17 NOTE — TOC Progression Note (Cosign Needed Addendum)
Transition of Care [facility]) - Progression Note    Patient Details  Name: Ivan [name] MRN: [id number] Date of Birth: Oct 28, 1960  Transition of Care [facility]) CM/SW [place], [place] Work Phone Number: 10/17/2019, 10:10 AM  Clinical Narrative:     MSW Intern contacted [facility], [facility], and [facility]. [facility] and [facility] are following for inpatient admission for hospice care. SW will continue to follow and update changes.     Addendum 10/17/2019 10:36 am [facility] now following. They will contact sister and discuss options. Referrals faxed to [facility] and [facility].  Expected Discharge Plan and Services                                                 Social Determinants of Health (SDOH) Interventions    Readmission Risk Interventions No flowsheet data found.

## 2019-10-17 NOTE — Progress Notes (Signed)
59 year old black male HTN DM TY 2 CKD 4 cirrhosis admitted from oncology clinic hypotensive with poor urine output 6 pound weight loss found to have hypoechoic lesions in the liver-found to have uremic encephalopathy temporary dialysis catheter placed  Eventually was found to have advanced pancreatic malignancy and was transitioned to comfort care Decision was made 2/26 for full comfort   o/e  He is awake and does move around voice but does not respond only right IJ in place does not seem to be in any pain  acute renal failure stage IV Hyperbilirubinemia secondary to pancreatic cancer Acute on chronic normocytic anemia Acute worsening back pain metabolic acidosis Schizophrenia agitation   Will await EOL discussions and Palliatve discussions and have placed referral to free-standing Hospice to facilitate planning--I had a long discussion with his sister [name] for about 15 minutes discussing this and she awaits Palliative care input  Verneita Griffes, MD [facility] 8:58 AM

## 2019-10-17 NOTE — Progress Notes (Signed)
Palliative Medicine Inpatient Follow Up Note   HPI: Per Hospitalist Notes --> 59 y.o. M with h/o DM, HTN, schizophrenia, CKD4, BPD, liver cirrhosis presented to ED from oncology clinic after found tobehypotensive-BP 70/48.Per sister,patient been weak, unable to stand,withback pain,poor p.o. intake, poor urinary output over the past 2 weeks,with a 60 pound weight loss over the past year. Has had one episode of vomiting and loose stools. She had also noticed yellowness of his eyes and skin. Patienthad an outpatient right upper quadrant abdominal ultrasound that showed multiple hypoechoic lesions in the liver worrisome for neoplasm. He has 19.50 pack-year smoking history. He reports current alcohol use of about 2.0 standard drinks of alcohol per week.   2/26 - Palliative care has been consulted for terminal pancreatic cancer with wide spread metastasis. Unable to get Palliative stent placed yesterday due to medical instability. Last evening he was made comfort care. A TOC consult was also placed for residential hospice.    Today's Discussion (10/17/2019): Chart reviewed. Spoke with patients sister, Ivan [name]. She has a family member coming into town from [place] who she would like to see Ivan [name]. We discussed our meeting time today of 1300. Asked CM to place hospice home referrals in [place] and [place] counties.   Discussed with patient the importance of continued conversation with family and their  medical providers regarding overall plan of care and treatment options, ensuring decisions are within the context of the patients values and GOCs.  Questions and concerns addressed   Vital Signs Vitals:   10/16/19 1210 10/17/19 0117  BP: 127/77 (!) 151/84  Pulse: (!) 104 (!) 103  Resp: (!) 34 (!) 22  Temp: 99.8 F (37.7 C) 99.2 F (37.3 C)  SpO2: 95% 97%    Intake/Output Summary (Last 24 hours) at 10/17/2019 0912 Last data filed at 10/17/2019 0050 Gross per 24 hour  Intake  266.14 ml  Output 578 ml  Net -311.86 ml   Last Weight  Most recent update: 10/16/2019 12:41 PM   Weight  97.5 kg (214 lb 15.2 oz)           Physical Exam Vitals and nursing note reviewed.  Constitutional:      Comments: Somnolent, withdrawn  HENT:     Head: Normocephalic.     Nose: Nose normal.  Eyes:     Pupils: Pupils are equal, round, and reactive to light.  Cardiovascular:     Rate and Rhythm: Tachycardia present.     Pulses: Normal pulses.  Pulmonary:     Effort: Pulmonary effort is normal.  Abdominal:     General: Bowel sounds are normal.  Musculoskeletal:     Cervical back: Normal range of motion.  Skin:    General: Skin is dry.     Capillary Refill: Capillary refill takes less than 2 seconds.  Neurological:     Mental Status: He is disoriented.   SUMMARY OF RECOMMENDATIONS   TOC --> Residential Hospice home referral (placed on 2/26)  Family meeting at [place]  Time Spent: 15 Greater than 50% of the time was spent in counseling and coordination of care ______________________________________________________________________________________ [place] Team Team Cell Phone: [phone] Please utilize secure chat with additional questions, if there is no response within 30 minutes please call the above phone number  Palliative Medicine Team providers are available by phone from 7am to 7pm daily and can be reached through the team cell phone.  Should this patient require assistance outside of  these hours, please call the patient's attending physician.

## 2019-10-17 NOTE — TOC Progression Note (Signed)
Transition of Care [facility]) - Progression Note    Patient Details  Name: Ivan [name] MRN: [id number] Date of Birth: 1960-09-05  Transition of Care [facility]) CM/SW [place], [place] Phone Number: 10/17/2019, 3:56 PM  Clinical Narrative:     CSW followed up with [facility] has a bed for pt.  Family has been updated.  Pt will need Covid screening before discharging to [place] . Physician has put in order for covid screening..         Expected Discharge Plan and Services                                                 Social Determinants of Health (SDOH) Interventions    Readmission Risk Interventions No flowsheet data found.

## 2019-10-17 NOTE — Consult Note (Signed)
10:30am Referral received from SW for Inpatient hospice. Call placed to Sister Hilda Blades to discuss services. She states it is not a good time to talk but will call RN back later today to discuss further. Will await return call. Doroteo Glassman, RN [facility] [phone]

## 2019-10-17 NOTE — Progress Notes (Signed)
Pt transferred to 6N. Attempted to contact sister about transfer.  Mailbox full, unable to leave voicemail.

## 2019-10-17 NOTE — Progress Notes (Signed)
Received from [place] to room 6N21 via bed @ 0215. Incontinent of bowel upon arrival. Patient bathed and bed linens changed. Positioned for comfort. Responds to pain only.

## 2019-10-17 NOTE — Progress Notes (Signed)
Manufacturing engineer Documentation  Liaison received a referral from [facility] for pt to transfer to [facility] for EOL care.   Liaison called and spoke with pt's sister, [name], who stated that she has not yet made a decision and prefers [facility] bc it is closer to family. Pt's sister has writer's number for any questions.   Thank you,  Freddie Breech, RN [phone]

## 2019-10-18 LAB — TYPE AND SCREEN
ABO/RH(D): O POS
Antibody Screen: NEGATIVE
Unit division: 0
Unit division: 0
Unit division: 0
Unit division: 0
Unit division: 0

## 2019-10-18 LAB — BPAM RBC
Blood Product Expiration Date: 202103212359
Blood Product Expiration Date: 202103232359
Blood Product Expiration Date: 202103282359
Blood Product Expiration Date: 202103282359
Blood Product Expiration Date: 202103282359
ISSUE DATE / TIME: 202102240536
ISSUE DATE / TIME: 202102241027
ISSUE DATE / TIME: 202102251218
ISSUE DATE / TIME: 202102251712
Unit Type and Rh: 5100
Unit Type and Rh: 5100
Unit Type and Rh: 5100
Unit Type and Rh: 5100
Unit Type and Rh: 5100

## 2019-10-18 LAB — CANCER ANTIGEN 19-9: CA 19-9: 2236 U/mL — ABNORMAL HIGH (ref 0–35)

## 2019-10-18 NOTE — TOC Transition Note (Signed)
Transition of Care [facility]) - CM/SW Discharge Note   Patient Details  Name: Ivan [name] MRN: [id number] Date of Birth: September 18, 1960  Transition of Care [facility]) CM/SW Contact:  Gelene Mink, [place] Phone Number: 10/18/2019, 1:49 PM   Clinical Narrative:     Patient will DC to: [facility] Anticipated DC date: 10/18/19 Family notified: Yes Transport by: Corey Harold   Per MD patient ready for DC to . RN, patient, patient's family, and facility notified of DC. Discharge Summary  sent to facility. RN to call report prior to discharge ([phone]; Rm 7). DC packet on chart. Ambulance transport requested for patient.   CSW will sign off for now as social work intervention is no longer needed. Please consult [place] again if new needs arise.  Renad Jenniges, LCSW-A [place]/Clinical Social Work Department Cell: [phone]    Final next level of care: [place] Barriers to Discharge: No Barriers Identified   Patient Goals and CMS Choice Patient states their goals for this hospitalization and ongoing recovery are:: Pt is transitioning to hospice CMS Medicare.gov Compare Post Acute Care list provided to:: Patient Represenative (must comment) Choice offered to / list presented to : Sibling  Discharge Placement              Patient chooses bed at: Other - please specify in the comment section below:([facility]) Patient to be transferred to facility by: [place] Name of family member notified: [name] Patient and family notified of of transfer: 10/18/19  Discharge Plan and Services                                     Social Determinants of Health (SDOH) Interventions     Readmission Risk Interventions No flowsheet data found.

## 2019-10-18 NOTE — Progress Notes (Signed)
Patient discharged to [facility], reported to nurse Robin and transported via [place].

## 2019-10-18 NOTE — Discharge Summary (Signed)
Physician Discharge Summary  Ivan [name] RJJ:[id number] DOB: February 19, 1961 DOA: 10/13/2019  PCP: Loman Brooklyn, FNP  Admit date: 10/13/2019 Discharge date: 10/18/2019  Time spent: 35 minutes  Recommendations for Outpatient Follow-up:  1. Patient being discharged to hospice home  Discharge Diagnoses:  Principal Problem:   Lesion of liver Active Problems:   Type 2 diabetes mellitus without complication, without long-term current use of insulin (HCC)   Essential hypertension   Paranoid schizophrenia (HCC)   CKD (chronic kidney disease) stage 4, GFR 15-29 ml/min (HCC)   Hepatic cirrhosis (HCC)   Elevated liver enzymes   Acute kidney injury superimposed on CKD (HCC)   Liver lesion   Jaundice   Prolonged QT interval   Palliative care by specialist   Goals of care, counseling/discussion   DNR (do not resuscitate)   Discharge Condition: Guarded  Diet recommendation: Comfort related  Filed Weights   10/16/19 0334 10/16/19 0823 10/16/19 1127  Weight: 97.8 kg 97.8 kg 97.5 kg    History of present illness:  59 year old black male with HTN DM TY 2 CKD 4 cirrhosis admitted from oncology clinic found to be hypotensive for urine output and weight loss Work-up revealed hypoechoic lesions in the liver he was uremic confused and temporary dialysis catheter was placed-Long discussions were pursued where patient's family and with palliative care and it was felt that he would be too high risk for any procedures under gastroenterology to work-up his likely pancreatic malignancy I discussed with his Sister [name] and she stated that she was on the same page in terms of discharge to hospice facility  His Covid test does come back negative and he can be discharged to hospice facility for end-of-life care We will keep his IV in place on discharge   Discharge Exam: Vitals:   10/17/19 0117 10/18/19 0616  BP: (!) 151/84 119/75  Pulse: (!) 103 88  Resp: (!) 22 18  Temp: 99.2 F (37.3 C)  97.7 F (36.5 C)  SpO2: 97% 98%    General: Snoring respirations no new issues-does not arouse Cardiovascular: S1-S2 no murmur Respiratory: Clinically clear no added sound no rales no rhonchi Abdomen soft no rebound  Discharge Instructions   Discharge Instructions    Diet - low sodium heart healthy   Complete by: As directed    Discharge instructions   Complete by: As directed    patient going to Hospice for eol care   Increase activity slowly   Complete by: As directed      Allergies as of 10/18/2019      Reactions   Bee Venom Swelling      Medication List    STOP taking these medications   allopurinol 100 MG tablet Commonly known as: ZYLOPRIM   amLODipine 10 MG tablet Commonly known as: NORVASC   aspirin 81 MG EC tablet   atorvastatin 20 MG tablet Commonly known as: LIPITOR   benztropine 0.5 MG tablet Commonly known as: COGENTIN   cloNIDine 0.1 MG tablet Commonly known as: CATAPRES   cyclobenzaprine 5 MG tablet Commonly known as: FLEXERIL   ferrous sulfate 325 (65 FE) MG tablet   furosemide 40 MG tablet Commonly known as: LASIX   haloperidol decanoate 100 MG/ML injection Commonly known as: HALDOL DECANOATE   labetalol 200 MG tablet Commonly known as: NORMODYNE   losartan 25 MG tablet Commonly known as: COZAAR   magnesium oxide 400 MG tablet Commonly known as: MAG-OX   OXcarbazepine 150 MG tablet Commonly known as: TRILEPTAL  potassium chloride SA 20 MEQ tablet Commonly known as: KLOR-CON   Tradjenta 5 MG Tabs tablet Generic drug: linagliptin   traZODone 150 MG tablet Commonly known as: DESYREL      Allergies  Allergen Reactions  . Bee Venom Swelling      The results of significant diagnostics from this hospitalization (including imaging, microbiology, ancillary and laboratory) are listed below for reference.    Significant Diagnostic Studies: CT LUMBAR SPINE WO CONTRAST  Result Date: 10/14/2019 CLINICAL DATA:  Low back  pain for greater than 6 weeks with new liver lesions, concern for malignancy EXAM: CT LUMBAR SPINE WITHOUT CONTRAST TECHNIQUE: Multidetector CT imaging of the lumbar spine was performed without intravenous contrast administration. Multiplanar CT image reconstructions were also generated. COMPARISON:  Lumbar radiograph 11/25/2014 FINDINGS: Segmentation: 5 lumbar type vertebral bodies are present and appear normally formed. Alignment: Preservation of the normal lumbar lordosis. No spondylolysis or spondylolisthesis. Vertebrae: No acute fracture vertebral body height loss is seen. Few Schmorl's nodes are noted at the endplates about the P7-1 level and at the inferior L4 endplate as well. No worrisome lytic or sclerotic lesions are present. Mild sclerotic changes at the superior endplate S1 are on a likely degenerative basis. Included portions of the sacrum reveal fusion across the SI joints. Paraspinal and other soft tissues: No paraspinal fluid, hemorrhage or soft tissue swelling is seen. Evaluation of the included portions of the abdomen reveal numerous hyperattenuating structures in both kidneys largest in the right measuring up to 2.6 cm and up to 70 Hounsfield units in attenuation. Larger fluid attenuation cyst is present in the posterior left kidney measuring up to 3.6 cm in size. There is atherosclerotic calcification of the aorta and iliac arteries as included. Scattered colonic diverticula without evidence of acute diverticulitis. Heterogeneous nodule present in the left adrenal gland measuring up to 1.9 cm in size (4/33). Disc levels: Level by level evaluation of the lumbar spine below: T12-L1: Mild disc height loss. No significant posterior disc abnormality. Minimal facet degenerative change. No significant spinal canal or foraminal stenosis. L1-L2: Mild disc height loss with mild global disc bulge. Moderate facet degenerative changes. No significant spinal canal or foraminal stenosis. L2-L3: Mild disc  height loss with mild global disc bulge. Moderate facet degenerative changes. No significant spinal canal or foraminal stenosis. L3-L4: Mild disc height loss and moderate global disc bulge and moderate facet degenerative changes with facet hypertrophy. Disc bulging results in narrowing of the subarticular recesses and contact of the traversing L4 nerve roots. Mild canal stenosis and mild bilateral foraminal narrowing. L4-L5: Mild disc height loss and moderate global disc bulge and moderate facet degenerative changes with facet hypertrophy. Disc bulging results in narrowing of the subarticular recesses and contact of the traversing L4 nerve roots. Mild canal stenosis with mild bilateral foraminal narrowing. L5-S1: Moderate disc height loss with global disc bulge. Calcified right central disc protrusion contacting the traversing S1 nerve root. Mild canal stenosis. Moderate left and mild right foraminal narrowing. IMPRESSION: 1. No acute fracture or vertebral body height loss of the lumbar spine. No suspicious osseous lesions. 2. Multilevel degenerative disc and facet degenerative changes throughout the lumbar spine as detailed level by level above. Most pronounced L3-S1. 3. Multiple hyperattenuating structures in both kidneys which may represent hyperdense cysts or solid masses. Consider further evaluation with nonemergent renal ultrasound. 4. Heterogeneous left adrenal nodule measuring up to 1.9 cm in size. Worrisome for potential metastatic disease in the setting of known malignancy. 5. Colonic diverticulosis  without evidence of acute diverticulitis. 6. Aortic Atherosclerosis (ICD10-I70.0). Electronically Signed   By: Lovena Le M.D.   On: 10/14/2019 00:05   US RENAL  Result Date: 10/14/2019 CLINICAL DATA:  Renal mass on CT EXAM: RENAL / URINARY TRACT ULTRASOUND COMPLETE COMPARISON:  CT 10/13/2019, ultrasound 09/22/2019 FINDINGS: Right Kidney: Renal measurements: 12.3 x 6 x 5.7 cm = volume: 222.3 mL. Echogenic  cortex. No hydronephrosis. Numerous, greater than 10 cysts in the right kidney. The largest are measured. Midpole cyst measures 4 x 3.9 x 3.7 cm. Upper pole cyst measures 3.3 x 3.7 x 3.2 cm. Left Kidney: Renal measurements: 13.3 x 5.8 x 6.5 cm = volume: 259.7 mL. Echogenic cortex. No hydronephrosis. Multiple cysts, greater than 10. The largest were measured. Midpole cyst measures 3.2 x 2.9 x 3 cm. Upper pole cyst measures 5.6 x 5.8 x 6.3 cm. Bladder: Appears normal for degree of bladder distention. Other: Multiple hypoechoic solid masses in the liver. IMPRESSION: 1. Echogenic kidneys bilaterally consistent with medical renal disease. No hydronephrosis. 2. Multiple cysts within the bilateral kidneys, greater than 10 within each kidney. No solid masses by sonography. 3. Multiple hypoechoic solid masses in the liver concerning for metastatic disease. Electronically Signed   By: Donavan Foil M.D.   On: 10/14/2019 17:19   MR ABDOMEN MRCP WO CONTRAST  Result Date: 10/15/2019 CLINICAL DATA:  Evaluate multiple liver lesions identified EXAM: MRI ABDOMEN WITHOUT CONTRAST  (INCLUDING MRCP) TECHNIQUE: Multiplanar multisequence MR imaging of the abdomen was performed. Heavily T2-weighted images of the biliary and pancreatic ducts were obtained, and three-dimensional MRCP images were rendered by post processing. COMPARISON:  Ultrasound limited 09/21/2018 FINDINGS: Markedly diminished exam detail due to motion artifact. According to the sonographer the patient is confused, disoriented and is unable to remain still despite being sedated twice. Lower chest: No acute abnormality. Hepatobiliary: Extensive, multifocal liver lesions are identified throughout both lobes. Dominant confluent mass within the right hepatic lobe measures at least 11.3 cm, image 26/14. Findings are highly concerning for diffuse liver metastases. No gallstones are identified. There is increase caliber of the common bile duct which measures up to 1.8 cm.  Abrupt cut off of the CBD at the level of the pancreas is noted, image 31/8. Pancreas: The main pancreatic duct appears dilated. A large soft tissue mass in the region of the head of pancreas is identified, indeterminate and incompletely characterized due to motion artifact and lack of IV contrast material. This measures approximately 6.6 x 6.6 cm, image 84/1200. Spleen:  Within normal limits in size and appearance. Adrenals/Urinary Tract: Adrenal gland suboptimally visualized. Innumerable bilateral kidney cysts of varying complexity are identified. These are all incompletely characterized due to motion artifact and lack of IV contrast material. No hydronephrosis noted. Stomach/Bowel: The stomach appears distended. The bowel loops are suboptimally evaluated due to motion. Vascular/Lymphatic:  Normal caliber abdominal aorta. Extensive retroperitoneal adenopathy is noted. Index left periaortic node measures 2.2 cm short axis, image 100/12. Index retrocaval node measures 1.7 cm, image 95/12. Index porta hepatic lymph node has a short axis of 3.8 cm. Other:  Small volume of perihepatic free fluid noted. Musculoskeletal: Areas of increased T2 signal noted throughout the visualized osseous structures concerning for diffuse osseous metastasis. IMPRESSION: 1. Markedly diminished exam detail due to motion artifact and lack of IV contrast material. Repeat cross-sectional imaging of the abdomen following resolution of the patient's current clinical issues is strongly recommended. The patient should be above the remain motionless and perform breath hold in  ordered to achieve diagnostic MR imaging of the abdomen. Alternatively, a CT may be considered as this is less susceptible to motion artifact. 2. Exam compare most presence of extensive liver lesions involving both lobes, too numerous to count. These are suspicious for metastatic disease. 3. Retroperitoneal adenopathy. 4. There is dilation of the pancreatic duct and common  bile duct. A mass is suspected in the region of the head of pancreas which may represent a primary pancreatic neoplasm or peripancreatic/porta hepatic adenopathy. These cannot be distinguished due to factors described above. 5. Suspect multifocal bone metastasis. Electronically Signed   By: Kerby Moors M.D.   On: 10/15/2019 12:51   IR Fluoro Guide CV Line Right  Result Date: 10/15/2019 INDICATION: 59 year old male referred for placement of hemodialysis catheter EXAM: IMAGE GUIDED PLACEMENT OF RIGHT IJ TEMPORARY HEMODIALYSIS CATHETER MEDICATIONS: NONE ANESTHESIA/SEDATION: NONE FLUOROSCOPY TIME:  Fluoroscopy Time: 0 minutes 6 seconds (3 mGy). COMPLICATIONS: None PROCEDURE: Informed written consent was obtained from the patient's family after a discussion of the risks, benefits, and alternatives to treatment. Questions regarding the procedure were encouraged and answered. The right neck was prepped with chlorhexidine in a sterile fashion, and a sterile drape was applied covering the operative field. Maximum barrier sterile technique with sterile gowns and gloves were used for the procedure. A timeout was performed prior to the initiation of the procedure. A micropuncture kit was utilized to access the right internal jugular vein under direct, real-time ultrasound guidance after the overlying soft tissues were anesthetized with 1% lidocaine with epinephrine. Ultrasound image documentation was performed. The microwire was kinked to measure appropriate catheter length. A guidewire was advanced to the level of the IVC. A 16 cm hemodialysis catheter was then placed over the wire. Final catheter positioning was confirmed and documented with a spot radiographic image. The catheter aspirates and flushes normally. The catheter was flushed with appropriate volume heparin dwells. Dressings were applied. The patient tolerated the procedure well without immediate post procedural complication. IMPRESSION: Status post right IJ  temporary hemodialysis catheter. Catheter ready for use. Signed, Dulcy Fanny. Dellia Nims, RPVI [facility] Radiology Electronically Signed   By: Corrie Mckusick D.O.   On: 10/15/2019 17:30   IR US Guide Vasc Access Right  Result Date: 10/15/2019 INDICATION: 59 year old male referred for placement of hemodialysis catheter EXAM: IMAGE GUIDED PLACEMENT OF RIGHT IJ TEMPORARY HEMODIALYSIS CATHETER MEDICATIONS: NONE ANESTHESIA/SEDATION: NONE FLUOROSCOPY TIME:  Fluoroscopy Time: 0 minutes 6 seconds (3 mGy). COMPLICATIONS: None PROCEDURE: Informed written consent was obtained from the patient's family after a discussion of the risks, benefits, and alternatives to treatment. Questions regarding the procedure were encouraged and answered. The right neck was prepped with chlorhexidine in a sterile fashion, and a sterile drape was applied covering the operative field. Maximum barrier sterile technique with sterile gowns and gloves were used for the procedure. A timeout was performed prior to the initiation of the procedure. A micropuncture kit was utilized to access the right internal jugular vein under direct, real-time ultrasound guidance after the overlying soft tissues were anesthetized with 1% lidocaine with epinephrine. Ultrasound image documentation was performed. The microwire was kinked to measure appropriate catheter length. A guidewire was advanced to the level of the IVC. A 16 cm hemodialysis catheter was then placed over the wire. Final catheter positioning was confirmed and documented with a spot radiographic image. The catheter aspirates and flushes normally. The catheter was flushed with appropriate volume heparin dwells. Dressings were applied. The patient tolerated  the procedure well without immediate post procedural complication. IMPRESSION: Status post right IJ temporary hemodialysis catheter. Catheter ready for use. Signed, Dulcy Fanny. Dellia Nims, RPVI [facility] Radiology Electronically Signed   By: Corrie Mckusick D.O.   On: 10/15/2019 17:30   ECHOCARDIOGRAM COMPLETE  Result Date: 10/16/2019    ECHOCARDIOGRAM REPORT   Patient Name:   Ivan [name] Date of Exam: 10/16/2019 Medical Rec #:  [id number]        Height:       75.0 in Accession #:    [id number]       Weight:       215.6 lb Date of Birth:  1961-04-18        BSA:          2.266 m Patient Age:    59 years         BP:           127/77 mmHg Patient Gender: M                HR:           106 bpm. Exam Location:  Inpatient Procedure: 2D Echo Indications:    Abnormal ECG 794.31 / R94.31  History:        Patient has no prior history of Echocardiogram examinations.                 Signs/Symptoms:Hypotension; Risk Factors:Diabetes. End stage                 renal disease, hepatits, Encephalopathy, Normocytic Anemia.  Sonographer:    Darlina Sicilian RDCS Referring Phys: [id number] [place]  1. Left ventricular ejection fraction, by estimation, is 60 to 65%. The left ventricle has normal function. The left ventricle has no regional wall motion abnormalities. There is moderate left ventricular hypertrophy. Left ventricular diastolic parameters were normal.  2. Right ventricular systolic function is normal. The right ventricular size is normal.  3. The mitral valve is normal in structure and function. No evidence of mitral valve regurgitation. No evidence of mitral stenosis.  4. The aortic valve is tricuspid. Aortic valve regurgitation is not visualized. Mild aortic valve sclerosis is present, with no evidence of aortic valve stenosis.  5. The inferior vena cava is normal in size with greater than 50% respiratory variability, suggesting right atrial pressure of 3 mmHg. FINDINGS  Left Ventricle: Left ventricular ejection fraction, by estimation, is 60 to 65%. The left ventricle has normal function. The left ventricle has no regional wall motion abnormalities.  The left ventricular internal cavity size was normal in size. There is  moderate left ventricular hypertrophy. Left ventricular diastolic parameters were normal. Right Ventricle: The right ventricular size is normal. No increase in right ventricular wall thickness. Right ventricular systolic function is normal. Left Atrium: Left atrial size was normal in size. Right Atrium: Right atrial size was normal in size. Pericardium: There is no evidence of pericardial effusion. Mitral Valve: The mitral valve is normal in structure and function. Normal mobility of the mitral valve leaflets. No evidence of mitral valve regurgitation. No evidence of mitral valve stenosis. Tricuspid Valve: The tricuspid valve is normal in structure. Tricuspid valve regurgitation is trivial. No evidence of tricuspid stenosis. Aortic Valve: The aortic valve is tricuspid. Aortic valve regurgitation is not visualized. Mild aortic valve sclerosis is present, with no evidence of aortic valve stenosis. Pulmonic Valve: The pulmonic valve was normal in structure. Pulmonic valve regurgitation  is not visualized. No evidence of pulmonic stenosis. Aorta: The aortic root is normal in size and structure. Venous: The inferior vena cava is normal in size with greater than 50% respiratory variability, suggesting right atrial pressure of 3 mmHg. IAS/Shunts: No atrial level shunt detected by color flow Doppler.  LEFT VENTRICLE PLAX 2D LVIDd:         5.01 cm  Diastology LVIDs:         2.81 cm  LV e' lateral:   5.87 cm/s LV PW:         1.19 cm  LV E/e' lateral: 12.0 LV IVS:        1.46 cm  LV e' medial:    5.11 cm/s LVOT diam:     2.30 cm  LV E/e' medial:  13.8 LV SV:         58 LV SV Index:   26 LVOT Area:     4.15 cm  RIGHT VENTRICLE RV S prime:     11.50 cm/s TAPSE (M-mode): 1.6 cm LEFT ATRIUM             Index       RIGHT ATRIUM           Index LA diam:        3.30 cm 1.46 cm/m  RA Area:     18.60 cm LA Vol (A2C):   65.3 ml 28.82 ml/m RA Volume:   48.20 ml   21.27 ml/m LA Vol (A4C):   44.1 ml 19.46 ml/m LA Biplane Vol: 56.6 ml 24.98 ml/m  AORTIC VALVE LVOT Vmax:   102.00 cm/s LVOT Vmean:  78.200 cm/s LVOT VTI:    0.140 m  AORTA Ao Root diam: 3.30 cm Ao Asc diam:  3.40 cm MITRAL VALVE MV Area (PHT): 5.38 cm    SHUNTS MV Decel Time: 141 msec    Systemic VTI:  0.14 m MV E velocity: 70.30 cm/s  Systemic Diam: 2.30 cm MV A velocity: 46.30 cm/s MV E/A ratio:  1.52 Jenkins Rouge MD Electronically signed by Jenkins Rouge MD Signature Date/Time: 10/16/2019/3:08:39 PM    Final    US Abdomen Limited RUQ  Result Date: 09/22/2019 CLINICAL DATA:  Elevated liver function test. EXAM: ULTRASOUND ABDOMEN LIMITED RIGHT UPPER QUADRANT COMPARISON:  None. FINDINGS: Gallbladder: No gallstones or wall thickening visualized. No sonographic Murphy sign noted by sonographer. The gallbladder is distended. Common bile duct: Diameter: 0.9 cm Liver: The liver is shrunken with a nodular border and heterogeneous echotexture consistent with cirrhosis. There is intrahepatic biliary ductal dilatation. Hypoechoic lesion in the posterior right hepatic lobe measures 9.6 x 6.5 x 9.9 cm. Multiple additional lesions with similar echogenicity are identified. Portal vein is patent on color Doppler imaging with normal direction of blood flow towards the liver. Other: None. IMPRESSION: Multiple hypoechoic lesions in the liver worrisome for neoplasm. The liver appears cirrhotic. Intra and extrahepatic biliary ductal dilatation. MR/MRCP is recommended for further evaluation of the above findings. These results will be called to the ordering clinician or representative by the Radiologist Assistant, and communication documented in the PACS or zVision Dashboard. Electronically Signed   By: Inge Rise M.D.   On: 09/22/2019 10:21    Microbiology: Recent Results (from the past 240 hour(s))  SARS CORONAVIRUS 2 (TAT 6-24 HRS) Nasopharyngeal Nasopharyngeal Swab     Status: None   Collection Time: 10/13/19   5:41 PM   Specimen: Nasopharyngeal Swab  Result Value Ref Range Status   SARS Coronavirus  2 NEGATIVE NEGATIVE Final    Comment: (NOTE) SARS-CoV-2 target nucleic acids are NOT DETECTED. The SARS-CoV-2 RNA is generally detectable in upper and lower respiratory specimens during the acute phase of infection. Negative results do not preclude SARS-CoV-2 infection, do not rule out co-infections with other pathogens, and should not be used as the sole basis for treatment or other patient management decisions. Negative results must be combined with clinical observations, patient history, and epidemiological information. The expected result is Negative. Fact Sheet for Patients: SugarRoll.be Fact Sheet for Healthcare Providers: https://www.woods-mathews.com/ This test is not yet approved or cleared by the [place] FDA and  has been authorized for detection and/or diagnosis of SARS-CoV-2 by FDA under an Emergency Use Authorization (EUA). This EUA will remain  in effect (meaning this test can be used) for the duration of the COVID-19 declaration under Section 56 4(b)(1) of the Act, 21 U.S.C. section 360bbb-3(b)(1), unless the authorization is terminated or revoked sooner. Performed at [facility], [place] [address] [place], [place] [address]   MRSA PCR Screening     Status: Abnormal   Collection Time: 10/14/19  3:43 AM   Specimen: Nasopharyngeal  Result Value Ref Range Status   MRSA by PCR POSITIVE (A) NEGATIVE Final    Comment:        The GeneXpert MRSA Assay (FDA approved for NASAL specimens only), is one component of a comprehensive MRSA colonization surveillance program. It is not intended to diagnose MRSA infection nor to guide or monitor treatment for MRSA infections. RESULT CALLED TO, READ BACK BY AND VERIFIED WITH: CELSO RN 10/14/19 0513 JDW Performed at [facility] [address] [place], [place] [address]   SARS  CORONAVIRUS 2 (TAT 6-24 HRS) Nasopharyngeal Nasopharyngeal Swab     Status: None   Collection Time: 10/17/19  3:34 PM   Specimen: Nasopharyngeal Swab  Result Value Ref Range Status   SARS Coronavirus 2 NEGATIVE NEGATIVE Final    Comment: (NOTE) SARS-CoV-2 target nucleic acids are NOT DETECTED. The SARS-CoV-2 RNA is generally detectable in upper and lower respiratory specimens during the acute phase of infection. Negative results do not preclude SARS-CoV-2 infection, do not rule out co-infections with other pathogens, and should not be used as the sole basis for treatment or other patient management decisions. Negative results must be combined with clinical observations, patient history, and epidemiological information. The expected result is Negative. Fact Sheet for Patients: SugarRoll.be Fact Sheet for Healthcare Providers: https://www.woods-mathews.com/ This test is not yet approved or cleared by the [place] FDA and  has been authorized for detection and/or diagnosis of SARS-CoV-2 by FDA under an Emergency Use Authorization (EUA). This EUA will remain  in effect (meaning this test can be used) for the duration of the COVID-19 declaration under Section 56 4(b)(1) of the Act, 21 U.S.C. section 360bbb-3(b)(1), unless the authorization is terminated or revoked sooner. Performed at [facility], [address] [place], [place] [address]      Labs: Basic Metabolic Panel: Recent Labs  Lab 10/13/19 1543 10/13/19 2150 10/14/19 1432 10/15/19 0502 10/16/19 0447  NA 133*  --  137 139 143  K 4.5  --  4.6 4.6 4.6  CL 97*  --  101 100 105  CO2 18*  --  19* 20* 21*  GLUCOSE 148*  --  131* 134* 126*  BUN 128*  --  158* 173* 136*  CREATININE 6.94*  --  7.52* 8.01* 6.63*  CALCIUM 9.4  --  9.0 8.9 8.7*  MG  --  3.0*  --   --   --    Liver Function Tests: Recent Labs  Lab 10/13/19 1543 10/14/19 1432 10/15/19 0502 10/16/19 0447   AST 265* 227* 156* 135*  ALT 123* 101* 83* 77*  ALKPHOS 1,068* 901* 768* 772*  BILITOT 8.2* 6.9* 5.8* 5.7*  PROT 7.8 6.8 6.3* 6.6  ALBUMIN 2.2* 1.6* 1.5* 1.5*   Recent Labs  Lab 10/13/19 1543  LIPASE 91*   Recent Labs  Lab 10/13/19 1543  AMMONIA 36*   CBC: Recent Labs  Lab 10/14/19 0145 10/14/19 0145 10/14/19 1432 10/14/19 1851 10/15/19 0502 10/15/19 1913 10/16/19 0447  WBC 14.5*  --  16.4*  --  17.0*  --  18.2*  HGB 5.0*   < > 6.9* 7.5* 6.3* 8.3* 8.0*  HCT 16.2*   < > 21.6* 23.6* 19.4* 25.4* 23.7*  MCV 89.0  --  88.9  --  89.0  --  86.8  PLT 383  --  370  --  318  --  267   < > = values in this interval not displayed.   Cardiac Enzymes: No results for input(s): CKTOTAL, CKMB, CKMBINDEX, TROPONINI in the last 168 hours. BNP: BNP (last 3 results) No results for input(s): BNP in the last 8760 hours.  ProBNP (last 3 results) No results for input(s): PROBNP in the last 8760 hours.  CBG: Recent Labs  Lab 10/16/19 0830 10/16/19 1220 10/16/19 1623 10/16/19 2025 10/17/19 0017  GLUCAP 118* 82 110* 124* 109*       Signed:  Nita Sells MD   [facility] 10/18/2019, 8:50 AM

## 2019-10-18 NOTE — Progress Notes (Addendum)
   Palliative Medicine Inpatient Follow Up Note   HPI: Per Hospitalist Notes --> 59 y.o. M with h/o DM, HTN, schizophrenia, CKD4, BPD, liver cirrhosis presented to ED from oncology clinic after found tobehypotensive-BP 70/48.Per sister,patient been weak, unable to stand,withback pain,poor p.o. intake, poor urinary output over the past 2 weeks,with a 60 pound weight loss over the past year. Has had one episode of vomiting and loose stools. She had also noticed yellowness of his eyes and skin. Patienthad an outpatient right upper quadrant abdominal ultrasound that showed multiple hypoechoic lesions in the liver worrisome for neoplasm. He has 19.50 pack-year smoking history. He reports current alcohol use of about 2.0 standard drinks of alcohol per week.   2/26 - Palliative care has been consulted for terminal pancreatic cancer with wide spread metastasis. Unable to get Palliative stent placed yesterday due to medical instability. Last evening he was made comfort care. A TOC consult was also placed for residential hospice.   2/27 - Met with patients sister, referrals send to various hospice homes. COVID-19 swab done and is (-).    Today's Discussion (10/18/2019): Chart reviewed. Assessed Ivan [name], he appeared more comfortable this morning. Did not display any nonverbal sign of pain or dyspnea.   Plan for transition to [facility]   Questions and concerns addressed   Vital Signs Vitals:   10/17/19 0117 10/18/19 0616  BP: (!) 151/84 119/75  Pulse: (!) 103 88  Resp: (!) 22 18  Temp: 99.2 F (37.3 C) 97.7 F (36.5 C)  SpO2: 97% 98%    Intake/Output Summary (Last 24 hours) at 10/18/2019 0818 Last data filed at 10/18/2019 0700 Gross per 24 hour  Intake 1139.41 ml  Output 300 ml  Net 839.41 ml   Last Weight  Most recent update: 10/16/2019 12:41 PM   Weight  97.5 kg (214 lb 15.2 oz)           Physical Exam Vitals and nursing note reviewed.  Constitutional:     Comments: Somnolent, withdrawn  HENT:     Head: Normocephalic.     Nose: Nose normal.  Eyes:     Pupils: Pupils are equal, round, and reactive to light.  Cardiovascular:     Rate and Rhythm: Tachycardia present.     Pulses: Normal pulses.  Pulmonary:     Effort: Pulmonary effort is normal.  Abdominal:     General: Bowel sounds are normal.  Musculoskeletal:     Cervical back: Normal range of motion.  Skin:    General: Skin is dry.     Capillary Refill: Capillary refill takes less than 2 seconds.  Neurological:     Mental Status: He is disoriented.   SUMMARY OF RECOMMENDATIONS   Symptoms appear well controlled  Plan for transition to [facility]  Time Spent: 15 Greater than 50% of the time was spent in counseling and coordination of care ______________________________________________________________________________________ [place] Team Team Cell Phone: [phone] Please utilize secure chat with additional questions, if there is no response within 30 minutes please call the above phone number  Palliative Medicine Team providers are available by phone from 7am to 7pm daily and can be reached through the team cell phone.  Should this patient require assistance outside of these hours, please call the patient's attending physician.

## 2019-10-29 ENCOUNTER — Ambulatory Visit: Payer: Medicare HMO | Admitting: Nurse Practitioner

## 2019-10-29 ENCOUNTER — Telehealth: Payer: Self-pay | Admitting: Internal Medicine

## 2019-10-29 ENCOUNTER — Ambulatory Visit (HOSPITAL_COMMUNITY): Admission: RE | Admit: 2019-10-29 | Payer: Medicare HMO | Source: Ambulatory Visit

## 2019-10-29 NOTE — Progress Notes (Deleted)
Referring Provider: Loman Brooklyn, FNP Primary Care Physician:  Loman Brooklyn, FNP Primary GI:  Dr.   Rayne Du chief complaint on file.   HPI:   Ivan [name] is a 59 y.o. male who presents for follow-up on cirrhosis and liver lesion.  The patient was last seen in our office 09/29/2019 for cirrhosis, lesion of the liver greater than 1 cm deemed high risk for HCC.  Previously at her primary care office visit noted anemia with hemoglobin of 8.2 in the setting of chronic kidney disease stage IV as well as elevated liver enzymes with AST/ALT 72/74, alkaline phosphatase 740, bilirubin normal.  Recommended GGT, nucleotidase 5 prime, hepatitis panel.  Abdominal ultrasound also ordered.  His ultrasound ended up finding multiple hypoechoic lesions worrisome for neoplasm of which the largest was 9.6 x 6.5 x 9.9 cm in the posterior right hepatic lobe.  Also noted cirrhotic appearance, intra and extrahepatic biliary ductal dilation and recommended MRI/MRCP follow-up.  At his last visit he was accompanied by his sister.  Previous records requested.  Denied hematochezia and melena.  Last colonoscopy 5 years ago at [facility].  No history of IV drug use, social/occasional alcohol.  No abdominal pain.  Has been noting more fatigue, sleeping 3 to 4 hours a day.  No other overt GI complaints.  Recommended updated labs, MRI/MRCP, follow-up in 4 weeks.  Extensive labs were completed 10/01/2019.  Normal viral hepatitis A, B, C (noted no immunity to hepatitis B).  Ferritin was significantly elevated at 1403 though possible acute phase reactant.  Ceruloplasmin mildly elevated at 33.6.  ANA positive, mitochondrial antibodies negative, anti-smooth muscle antibody negative, alpha 1 antitrypsin mildly elevated at 282.  MRI had already been scheduled by primary care and was completed 10/15/2019.  Findings included diminished exam due to motion artifact and lack of IV contrast.  Noted extensive liver lesions too  numerous to count to suspicious for metastatic disease, retroperitoneal adenopathy, dilation of the pancreatic duct and CBD with a mass suspected in the region of the head of the pancreas which may represent a primary pancreatic neoplasm or peripancreatic/porta hepatic adenopathy unable to be distinguished due to motion artifact.  Also suspect multifocal bone metastasis.  The patient was urgently referred to oncology.  Patient was seen by oncology 10/13/2019 for lesion of liver.  In the office patient was significantly weak and hypotensive with a blood pressure of 70/48.  Noted 30 to 60 pound weight loss in the past year.  Sister noted jaundice for the last couple weeks.  Recommended AFP, likely need ERCP and stenting, PET/CT for further staging.  He was referred to the emergency department for weakness and hypotension.  AFP was noted to be normal at less than 0.9.  INR significantly elevated at 2.7.  SARS-CoV-2 negative.  The patient was admitted to [facility] from 10/13/2019 through 10/18/2019.  Temporary dialysis catheter placed and long-term discussions were pursued with palliative care and felt he would be too high risk for any procedures under GI work-up likely pancreatic malignancy.  The patient was subsequently discharged to hospice home.  Today he states   Past Medical History:  Diagnosis Date  . Bipolar 1 disorder ([place])   . Diabetes mellitus   . Edema of both legs 04/07/2013  . Gout 09/15/2019  . Hypertension   . Hypokalemia 04/02/2013  . Murmur    Echo done 2018  . Paranoid schizophrenia ([place])   . Schizoaffective disorder ([place])  Past Surgical History:  Procedure Laterality Date  . BREAST SURGERY  1970   breast reduction  . COLONOSCOPY  2017   WNL  . EYE SURGERY Right    prosthesis  . FRACTURE SURGERY     ribs and arm  . IR FLUORO GUIDE CV LINE RIGHT  10/15/2019  . IR US GUIDE VASC ACCESS RIGHT  10/15/2019  . TRANSTHORACIC ECHOCARDIOGRAM  04/04/2017   EF 60-65%;  grade I diastolic dysfunction    No current outpatient medications on file.   No current facility-administered medications for this visit.    Allergies as of 10/29/2019 - Review Complete 10/14/2019  Allergen Reaction Noted  . Bee venom Swelling 04/07/2014    Family History  Problem Relation Age of Onset  . Diabetes Mother   . Heart disease Mother   . Kidney disease Mother   . Heart disease Father   . Lung cancer Father   . Diabetes Son   . Diabetes Maternal Grandmother   . Heart disease Paternal Grandmother   . Heart disease Paternal Grandfather   . Breast cancer Paternal Aunt   . Prostate cancer Paternal Uncle   . Depression Neg Hx   . Colon cancer Neg Hx   . Liver disease Neg Hx     Social History   Socioeconomic History  . Marital status: Divorced    Spouse name: Not on file  . Number of children: Not on file  . Years of education: Not on file  . Highest education level: Not on file  Occupational History  . Not on file  Tobacco Use  . Smoking status: Current Every Day Smoker    Packs/day: 0.50    Years: 39.00    Pack years: 19.50    Types: Cigarettes, Cigars  . Smokeless tobacco: Current User    Types: Chew  . Tobacco comment: chewing tobacco in the summertime.  Substance and Sexual Activity  . Alcohol use: Yes    Alcohol/week: 2.0 standard drinks    Types: 1 Glasses of wine, 1 Cans of beer per week    Comment: occasional/social alcohol. No history of heavy drinking  . Drug use: No  . Sexual activity: Never  Other Topics Concern  . Not on file  Social History Narrative  . Not on file   Social Determinants of Health   Financial Resource Strain:   . Difficulty of Paying Living Expenses:   Food Insecurity:   . Worried About Charity fundraiser in the Last Year:   . Arboriculturist in the Last Year:   Transportation Needs:   . Film/video editor (Medical):   Marland Kitchen Lack of Transportation (Non-Medical):   Physical Activity:   . Days of Exercise  per Week:   . Minutes of Exercise per Session:   Stress:   . Feeling of Stress :   Social Connections:   . Frequency of Communication with Friends and Family:   . Frequency of Social Gatherings with Friends and Family:   . Attends Religious Services:   . Active Member of Clubs or Organizations:   . Attends Archivist Meetings:   Marland Kitchen Marital Status:     Review of Systems: General: Negative for anorexia, weight loss, fever, chills, fatigue, weakness. Eyes: Negative for vision changes.  ENT: Negative for hoarseness, difficulty swallowing , nasal congestion. CV: Negative for chest pain, angina, palpitations, dyspnea on exertion, peripheral edema.  Respiratory: Negative for dyspnea at rest, dyspnea on exertion, cough, sputum,  wheezing.  GI: See history of present illness. GU:  Negative for dysuria, hematuria, urinary incontinence, urinary frequency, nocturnal urination.  MS: Negative for joint pain, low back pain.  Derm: Negative for rash or itching.  Neuro: Negative for weakness, abnormal sensation, seizure, frequent headaches, memory loss, confusion.  Psych: Negative for anxiety, depression, suicidal ideation, hallucinations.  Endo: Negative for unusual weight change.  Heme: Negative for bruising or bleeding. Allergy: Negative for rash or hives.   Physical Exam: There were no vitals taken for this visit. General:   Alert and oriented. Pleasant and cooperative. Well-nourished and well-developed.  Head:  Normocephalic and atraumatic. Eyes:  Without icterus, sclera clear and conjunctiva pink.  Ears:  Normal auditory acuity. Mouth:  No deformity or lesions, oral mucosa pink.  Throat/Neck:  Supple, without mass or thyromegaly. Cardiovascular:  S1, S2 present without murmurs appreciated. Normal pulses noted. Extremities without clubbing or edema. Respiratory:  Clear to auscultation bilaterally. No wheezes, rales, or rhonchi. No distress.  Gastrointestinal:  +BS, soft,  non-tender and non-distended. No HSM noted. No guarding or rebound. No masses appreciated.  Rectal:  Deferred  Musculoskalatal:  Symmetrical without gross deformities. Normal posture. Skin:  Intact without significant lesions or rashes. Neurologic:  Alert and oriented x4;  grossly normal neurologically. Psych:  Alert and cooperative. Normal mood and affect. Heme/Lymph/Immune: No significant cervical adenopathy. No excessive bruising noted.    10/29/2019 8:15 AM   Disclaimer: This note was dictated with voice recognition software. Similar sounding words can inadvertently be transcribed and may not be corrected upon review.

## 2019-10-29 NOTE — Telephone Encounter (Signed)
Patient did not come to appointment, he is in hospice

## 2019-11-19 DEATH — deceased

## 2019-12-15 ENCOUNTER — Ambulatory Visit: Payer: Medicare HMO | Admitting: Family Medicine

## 2020-06-17 IMAGING — CT CT L SPINE W/O CM
3 series · 11 of 33 positions shown, 13 images · non-contrast
Comparison: Lumbar radiograph 11/25/2014

CLINICAL DATA: Low back pain for greater than 6 weeks with new
liver lesions, concern for malignancy

EXAM:
CT LUMBAR SPINE WITHOUT CONTRAST
TECHNIQUE: Multidetector CT imaging of the lumbar spine was performed without
intravenous contrast administration. Multiplanar CT image
reconstructions were also generated.

[Series 4: l-spine 2.0 st · axial · 0.34mm/px · z∈[-250,-100]mm · 3 of 123 slices shown, 4 images]
[im 29/123  soft-tissue]
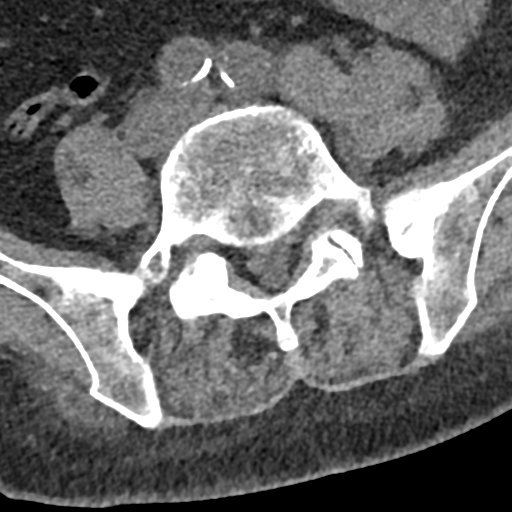
[im 29/123  bone]
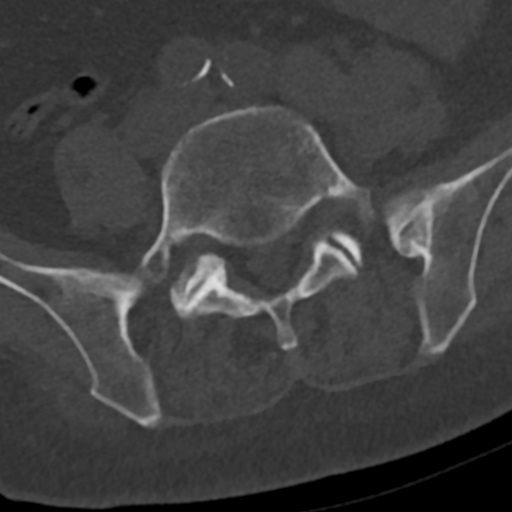
[im 66/123  bone]
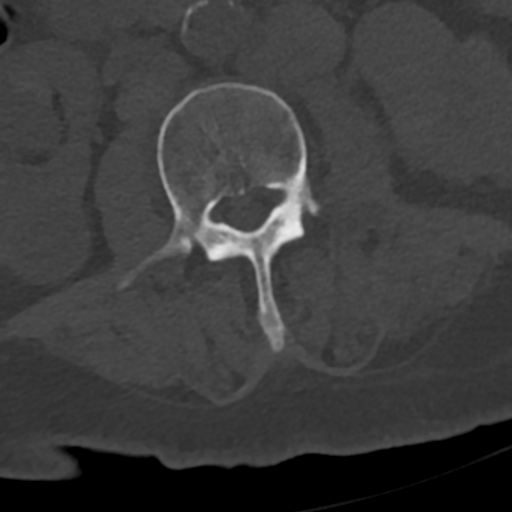
[im 104/123  bone]
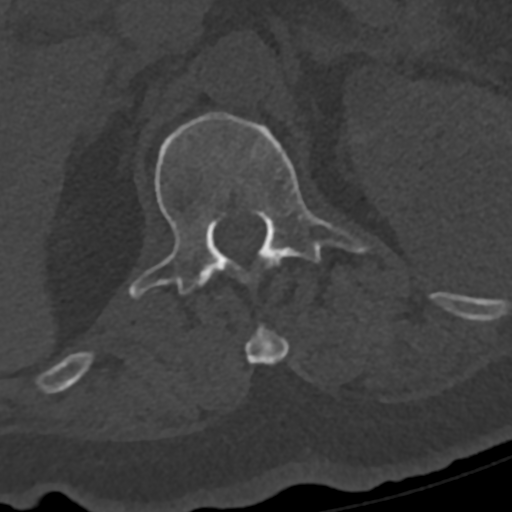

[Series 8: l-spine 2.0 cor bone · coronal · 0.36mm/px · 3 of 73 slices shown]
[im 15/73  bone]
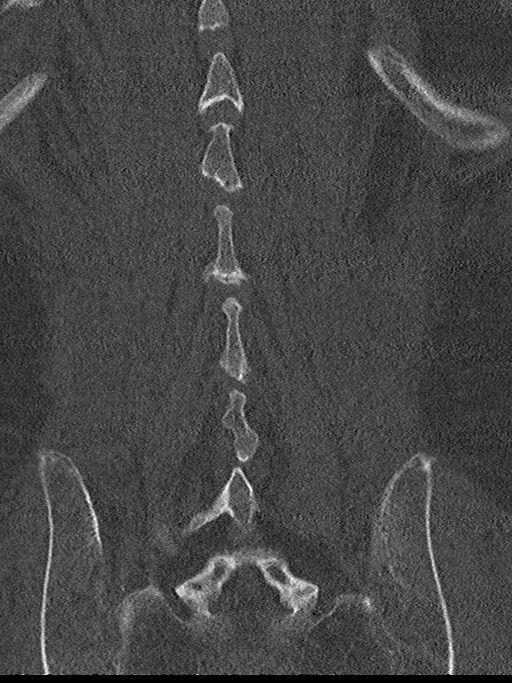
[im 29/73  bone]
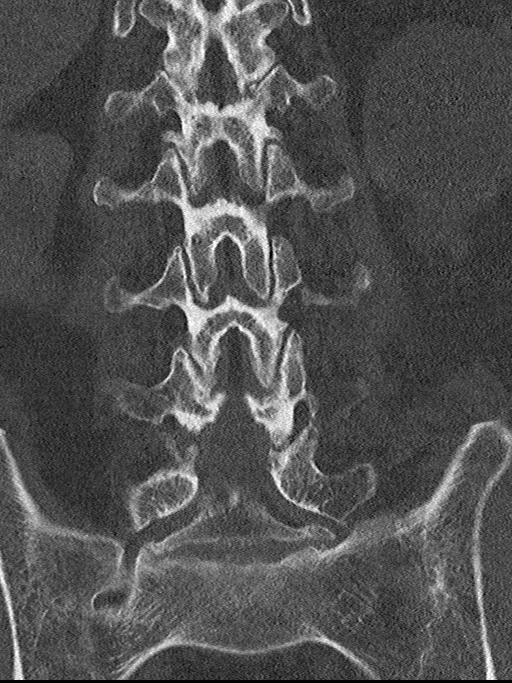
[im 44/73  bone]
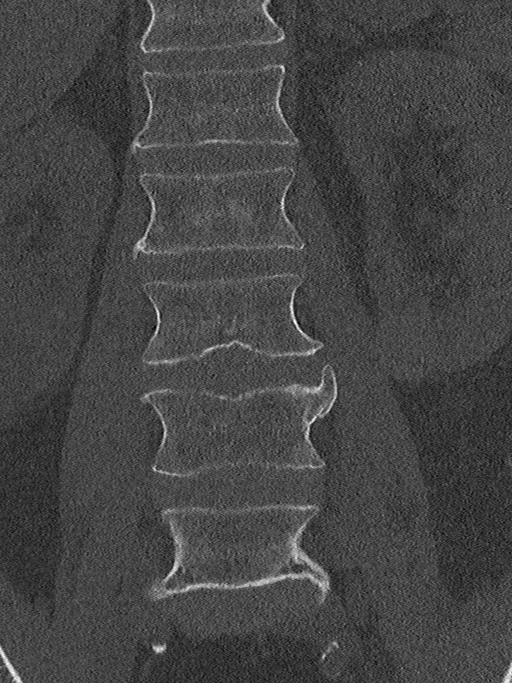

[Series 9: l-spine 2.0 sag bone · sagittal · 0.30mm/px · 5 of 66 slices shown, 6 images]
[im 22/66  bone]
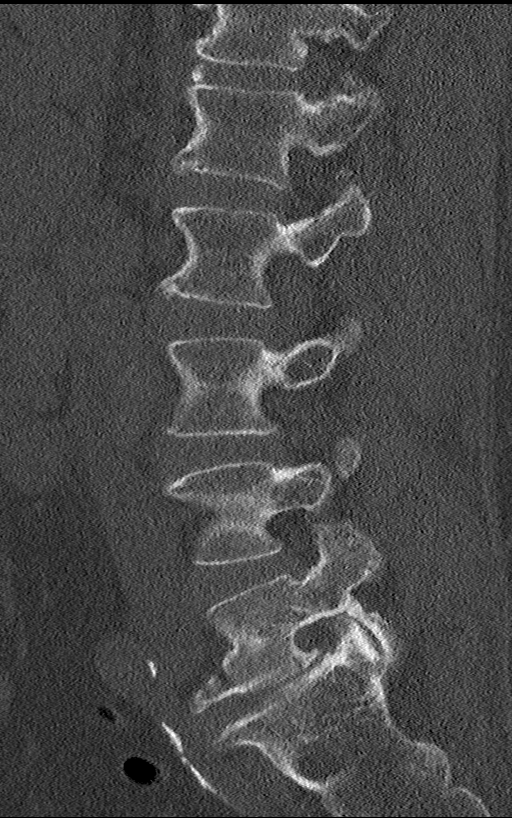
[im 28/66  bone]
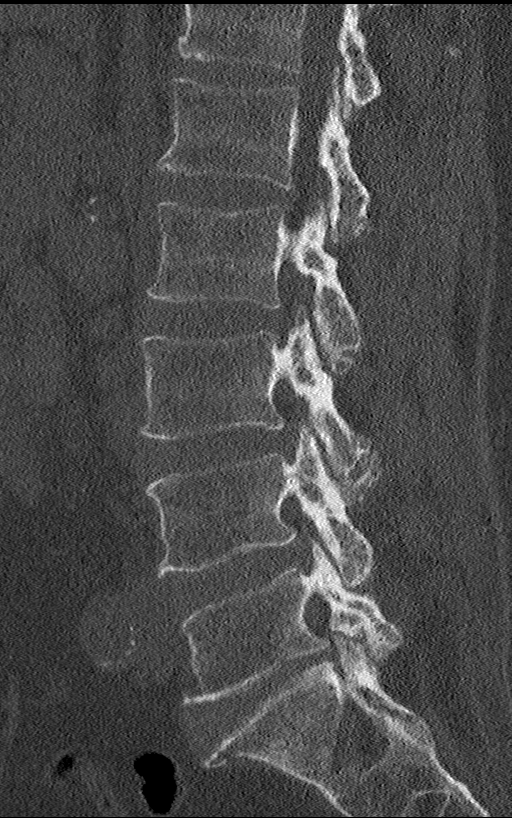
[im 33/66  soft-tissue]
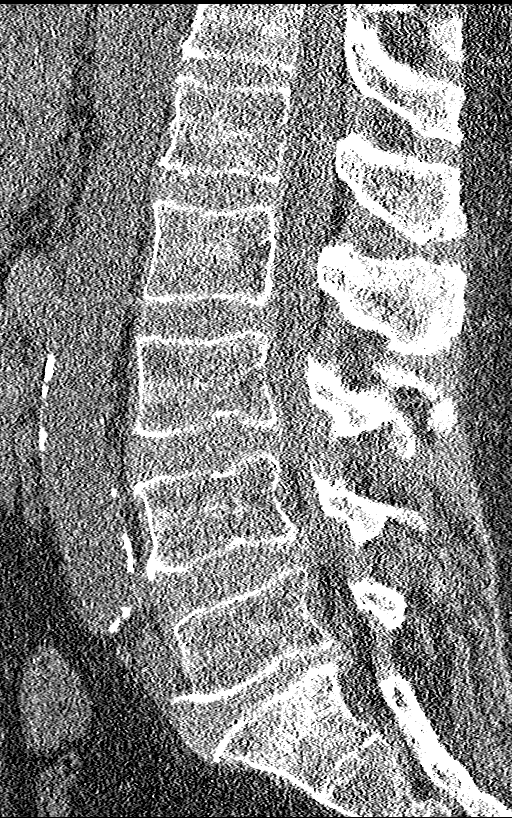
[im 33/66  bone]
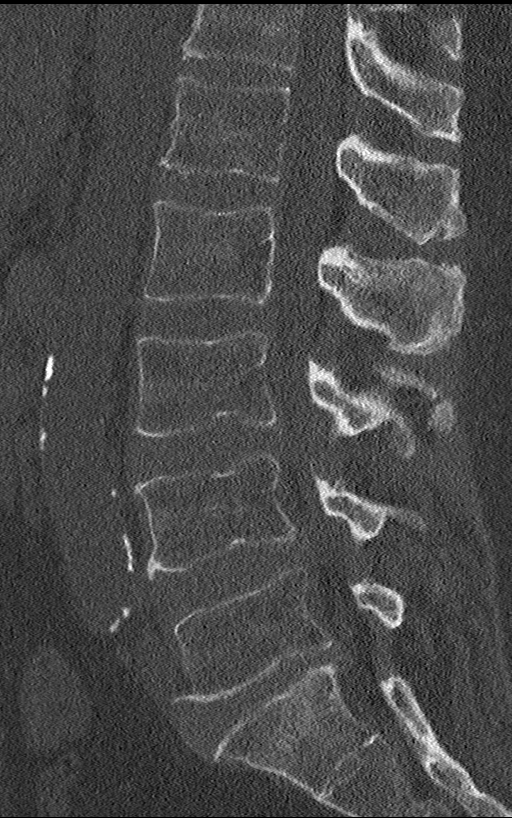
[im 38/66  bone]
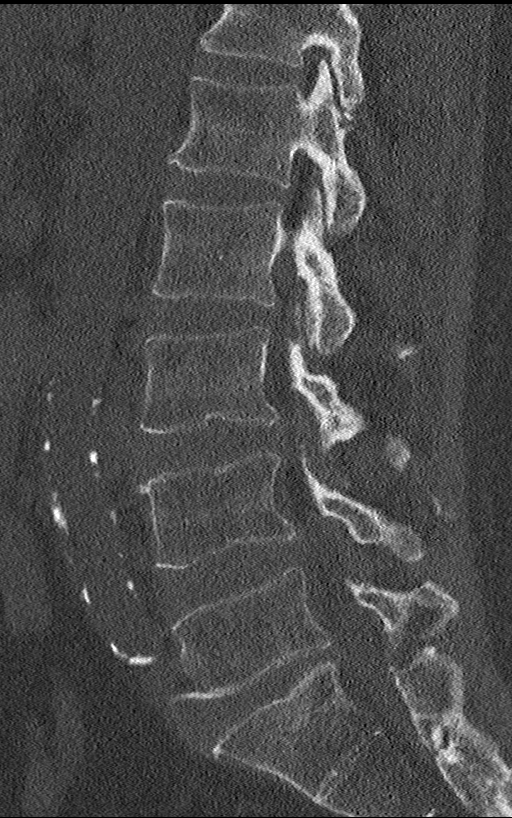
[im 44/66  bone]
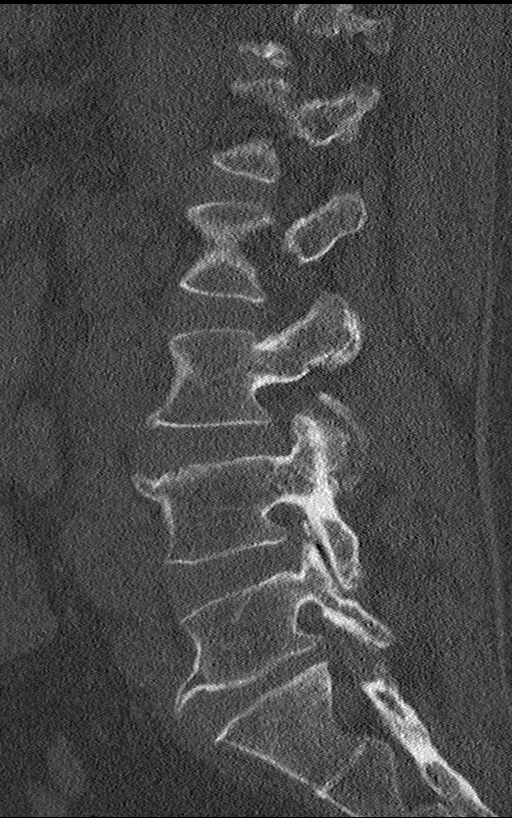

[11 of 33 positions shown; findings below may reference images not displayed]

FINDINGS: Segmentation: 5 lumbar type vertebral bodies are present and appear
normally formed.

Alignment: Preservation of the normal lumbar lordosis. No
spondylolysis or spondylolisthesis.

Vertebrae: No acute fracture vertebral body height loss is seen. Few
Schmorl's nodes are noted at the endplates about the L3-4 level and
at the inferior L4 endplate as well. No worrisome lytic or sclerotic
lesions are present. Mild sclerotic changes at the superior endplate
S1 are on a likely degenerative basis. Included portions of the
sacrum reveal fusion across the SI joints.

Paraspinal and other soft tissues: No paraspinal fluid, hemorrhage
or soft tissue swelling is seen.

Evaluation of the included portions of the abdomen reveal numerous
hyperattenuating structures in both kidneys largest in the right
measuring up to 2.6 cm and up to 70 Hounsfield units in attenuation.
Larger fluid attenuation cyst is present in the posterior left
kidney measuring up to 3.6 cm in size. There is atherosclerotic
calcification of the aorta and iliac arteries as included. Scattered
colonic diverticula without evidence of acute diverticulitis.
Heterogeneous nodule present in the left adrenal gland measuring up
to 1.9 cm in size (4/33).

Disc levels:

Level by level evaluation of the lumbar spine below:

T12-L1: Mild disc height loss. No significant posterior disc
abnormality. Minimal facet degenerative change. No significant
spinal canal or foraminal stenosis.

L1-L2: Mild disc height loss with mild global disc bulge. Moderate
facet degenerative changes. No significant spinal canal or foraminal
stenosis.

L2-L3: Mild disc height loss with mild global disc bulge. Moderate
facet degenerative changes. No significant spinal canal or foraminal
stenosis.

L3-L4: Mild disc height loss and moderate global disc bulge and
moderate facet degenerative changes with facet hypertrophy. Disc
bulging results in narrowing of the subarticular recesses and
contact of the traversing L4 nerve roots. Mild canal stenosis and
mild bilateral foraminal narrowing.

L4-L5: Mild disc height loss and moderate global disc bulge and
moderate facet degenerative changes with facet hypertrophy. Disc
bulging results in narrowing of the subarticular recesses and
contact of the traversing L4 nerve roots. Mild canal stenosis with
mild bilateral foraminal narrowing.

L5-S1: Moderate disc height loss with global disc bulge. Calcified
right central disc protrusion contacting the traversing S1 nerve
root. Mild canal stenosis. Moderate left and mild right foraminal
narrowing.
IMPRESSION: 1. No acute fracture or vertebral body height loss of the lumbar
spine. No suspicious osseous lesions.
2. Multilevel degenerative disc and facet degenerative changes
throughout the lumbar spine as detailed level by level above. Most
pronounced L3-S1.
3. Multiple hyperattenuating structures in both kidneys which may
represent hyperdense cysts or solid masses. Consider further
evaluation with nonemergent renal ultrasound.
4. Heterogeneous left adrenal nodule measuring up to 1.9 cm in size.
Worrisome for potential metastatic disease in the setting of known
malignancy.
5. Colonic diverticulosis without evidence of acute diverticulitis.
6. Aortic Atherosclerosis ([id number]-K36.6).
